# Patient Record
Sex: Male | Born: 1935 | Race: White | Hispanic: No | Smoking: Former smoker
Health system: Southern US, Community
[De-identification: ages and names within clinical notes are randomized; demographics above are authoritative.]

## PROBLEM LIST (undated history)

## (undated) DIAGNOSIS — M549 Dorsalgia, unspecified: Secondary | ICD-10-CM

## (undated) DIAGNOSIS — G2581 Restless legs syndrome: Secondary | ICD-10-CM

## (undated) DIAGNOSIS — I509 Heart failure, unspecified: Secondary | ICD-10-CM

## (undated) DIAGNOSIS — J449 Chronic obstructive pulmonary disease, unspecified: Secondary | ICD-10-CM

## (undated) DIAGNOSIS — I10 Essential (primary) hypertension: Secondary | ICD-10-CM

## (undated) DIAGNOSIS — D649 Anemia, unspecified: Secondary | ICD-10-CM

## (undated) DIAGNOSIS — I2510 Atherosclerotic heart disease of native coronary artery without angina pectoris: Secondary | ICD-10-CM

## (undated) DIAGNOSIS — D72820 Lymphocytosis (symptomatic): Secondary | ICD-10-CM

## (undated) DIAGNOSIS — D696 Thrombocytopenia, unspecified: Secondary | ICD-10-CM

## (undated) DIAGNOSIS — M1990 Unspecified osteoarthritis, unspecified site: Secondary | ICD-10-CM

## (undated) DIAGNOSIS — Z7901 Long term (current) use of anticoagulants: Secondary | ICD-10-CM

## (undated) DIAGNOSIS — G8929 Other chronic pain: Secondary | ICD-10-CM

## (undated) DIAGNOSIS — I4891 Unspecified atrial fibrillation: Secondary | ICD-10-CM

## (undated) DIAGNOSIS — T7840XA Allergy, unspecified, initial encounter: Secondary | ICD-10-CM

## (undated) DIAGNOSIS — K219 Gastro-esophageal reflux disease without esophagitis: Secondary | ICD-10-CM

## (undated) DIAGNOSIS — N529 Male erectile dysfunction, unspecified: Secondary | ICD-10-CM

## (undated) DIAGNOSIS — I441 Atrioventricular block, second degree: Secondary | ICD-10-CM

## (undated) DIAGNOSIS — E785 Hyperlipidemia, unspecified: Secondary | ICD-10-CM

## (undated) HISTORY — PX: PACEMAKER INSERTION: SHX728

## (undated) HISTORY — DX: Unspecified osteoarthritis, unspecified site: M19.90

## (undated) HISTORY — DX: Male erectile dysfunction, unspecified: N52.9

## (undated) HISTORY — DX: Heart failure, unspecified: I50.9

## (undated) HISTORY — DX: Chronic obstructive pulmonary disease, unspecified: J44.9

## (undated) HISTORY — DX: Hyperlipidemia, unspecified: E78.5

## (undated) HISTORY — DX: Lymphocytosis (symptomatic): D72.820

## (undated) HISTORY — DX: Restless legs syndrome: G25.81

## (undated) HISTORY — DX: Long term (current) use of anticoagulants: Z79.01

## (undated) HISTORY — DX: Other chronic pain: G89.29

## (undated) HISTORY — DX: Dorsalgia, unspecified: M54.9

## (undated) HISTORY — PX: CORONARY ARTERY BYPASS GRAFT: SHX141

## (undated) HISTORY — DX: Gastro-esophageal reflux disease without esophagitis: K21.9

## (undated) HISTORY — DX: Atherosclerotic heart disease of native coronary artery without angina pectoris: I25.10

## (undated) HISTORY — DX: Allergy, unspecified, initial encounter: T78.40XA

## (undated) HISTORY — DX: Essential (primary) hypertension: I10

## (undated) HISTORY — DX: Atrioventricular block, second degree: I44.1

## (undated) HISTORY — DX: Thrombocytopenia, unspecified: D69.6

## (undated) HISTORY — DX: Anemia, unspecified: D64.9

## (undated) HISTORY — PX: CATARACT EXTRACTION: SUR2

## (undated) HISTORY — DX: Unspecified atrial fibrillation: I48.91

---

## 1999-10-07 ENCOUNTER — Encounter: Admission: RE | Admit: 1999-10-07 | Discharge: 1999-10-07 | Payer: Self-pay | Admitting: Family Medicine

## 1999-10-07 ENCOUNTER — Encounter: Payer: Self-pay | Admitting: Family Medicine

## 1999-11-17 ENCOUNTER — Encounter: Payer: Self-pay | Admitting: Family Medicine

## 1999-11-17 ENCOUNTER — Encounter: Admission: RE | Admit: 1999-11-17 | Discharge: 1999-11-17 | Payer: Self-pay | Admitting: Family Medicine

## 2000-11-23 ENCOUNTER — Encounter: Admission: RE | Admit: 2000-11-23 | Discharge: 2000-11-23 | Payer: Self-pay | Admitting: Family Medicine

## 2000-11-23 ENCOUNTER — Encounter: Payer: Self-pay | Admitting: Family Medicine

## 2000-12-31 ENCOUNTER — Emergency Department (HOSPITAL_COMMUNITY): Admission: EM | Admit: 2000-12-31 | Discharge: 2001-01-01 | Payer: Self-pay | Admitting: Emergency Medicine

## 2001-12-03 ENCOUNTER — Encounter: Payer: Self-pay | Admitting: Family Medicine

## 2001-12-03 ENCOUNTER — Encounter: Admission: RE | Admit: 2001-12-03 | Discharge: 2001-12-03 | Payer: Self-pay | Admitting: Family Medicine

## 2002-10-07 ENCOUNTER — Encounter: Payer: Self-pay | Admitting: Family Medicine

## 2002-10-07 ENCOUNTER — Encounter: Admission: RE | Admit: 2002-10-07 | Discharge: 2002-10-07 | Payer: Self-pay | Admitting: Family Medicine

## 2003-10-30 ENCOUNTER — Encounter: Admission: RE | Admit: 2003-10-30 | Discharge: 2003-10-30 | Payer: Self-pay | Admitting: Family Medicine

## 2004-05-31 ENCOUNTER — Encounter: Admission: RE | Admit: 2004-05-31 | Discharge: 2004-05-31 | Payer: Self-pay | Admitting: *Deleted

## 2005-01-05 ENCOUNTER — Ambulatory Visit: Payer: Self-pay | Admitting: Family Medicine

## 2005-01-21 ENCOUNTER — Ambulatory Visit: Payer: Self-pay | Admitting: Family Medicine

## 2005-05-27 ENCOUNTER — Ambulatory Visit: Payer: Self-pay | Admitting: Family Medicine

## 2005-06-13 ENCOUNTER — Ambulatory Visit: Payer: Self-pay | Admitting: Family Medicine

## 2005-06-13 ENCOUNTER — Inpatient Hospital Stay (HOSPITAL_COMMUNITY): Admission: AD | Admit: 2005-06-13 | Discharge: 2005-06-17 | Payer: Self-pay | Admitting: Family Medicine

## 2005-06-14 ENCOUNTER — Ambulatory Visit: Payer: Self-pay | Admitting: Family Medicine

## 2005-06-14 ENCOUNTER — Ambulatory Visit: Payer: Self-pay | Admitting: Cardiology

## 2005-06-27 ENCOUNTER — Ambulatory Visit: Payer: Self-pay | Admitting: Cardiology

## 2005-09-08 ENCOUNTER — Ambulatory Visit: Payer: Self-pay | Admitting: Cardiology

## 2005-09-12 ENCOUNTER — Ambulatory Visit: Payer: Self-pay | Admitting: Cardiology

## 2005-09-28 ENCOUNTER — Ambulatory Visit: Payer: Self-pay | Admitting: Family Medicine

## 2005-11-01 ENCOUNTER — Ambulatory Visit: Payer: Self-pay | Admitting: Cardiology

## 2005-12-06 ENCOUNTER — Ambulatory Visit: Payer: Self-pay | Admitting: Cardiology

## 2006-06-07 ENCOUNTER — Ambulatory Visit: Payer: Self-pay | Admitting: Cardiology

## 2006-12-06 ENCOUNTER — Ambulatory Visit: Payer: Self-pay | Admitting: Family Medicine

## 2006-12-06 LAB — CONVERTED CEMR LAB
Alkaline Phosphatase: 85 units/L (ref 39–117)
BUN: 12 mg/dL (ref 6–23)
Basophils Absolute: 0 10*3/uL (ref 0.0–0.1)
Basophils Relative: 0.1 % (ref 0.0–1.0)
Creatinine, Ser: 1.1 mg/dL (ref 0.4–1.5)
Eosinophils Relative: 1.6 % (ref 0.0–5.0)
GFR calc Af Amer: 83 mL/min
GFR calc non Af Amer: 69 mL/min
Glucose, Bld: 103 mg/dL — ABNORMAL HIGH (ref 70–99)
LDL Cholesterol: 93 mg/dL (ref 0–99)
Neutro Abs: 2.7 10*3/uL (ref 1.4–7.7)
Platelets: 120 10*3/uL — ABNORMAL LOW (ref 150–400)
Potassium: 4.3 meq/L (ref 3.5–5.1)
RBC: 4.86 M/uL (ref 4.22–5.81)
RDW: 12.9 % (ref 11.5–14.6)
Sodium: 147 meq/L — ABNORMAL HIGH (ref 135–145)
TSH: 1.38 microintl units/mL (ref 0.35–5.50)
Total Bilirubin: 1 mg/dL (ref 0.3–1.2)
Total Protein: 7.2 g/dL (ref 6.0–8.3)
Triglycerides: 93 mg/dL (ref 0–149)

## 2006-12-07 ENCOUNTER — Ambulatory Visit: Payer: Self-pay | Admitting: Cardiology

## 2006-12-14 ENCOUNTER — Ambulatory Visit: Payer: Self-pay | Admitting: *Deleted

## 2006-12-14 ENCOUNTER — Encounter: Admission: RE | Admit: 2006-12-14 | Discharge: 2006-12-14 | Payer: Self-pay | Admitting: Family Medicine

## 2007-01-09 ENCOUNTER — Ambulatory Visit: Payer: Self-pay | Admitting: Family Medicine

## 2007-05-10 ENCOUNTER — Telehealth: Payer: Self-pay | Admitting: Family Medicine

## 2007-06-07 ENCOUNTER — Ambulatory Visit: Payer: Self-pay | Admitting: Cardiology

## 2007-06-28 ENCOUNTER — Ambulatory Visit: Payer: Self-pay | Admitting: *Deleted

## 2007-08-08 ENCOUNTER — Telehealth: Payer: Self-pay

## 2007-08-15 ENCOUNTER — Ambulatory Visit: Payer: Self-pay | Admitting: Family Medicine

## 2007-08-15 DIAGNOSIS — J449 Chronic obstructive pulmonary disease, unspecified: Secondary | ICD-10-CM

## 2007-08-15 DIAGNOSIS — R350 Frequency of micturition: Secondary | ICD-10-CM

## 2007-08-15 DIAGNOSIS — N400 Enlarged prostate without lower urinary tract symptoms: Secondary | ICD-10-CM | POA: Insufficient documentation

## 2007-08-15 DIAGNOSIS — K219 Gastro-esophageal reflux disease without esophagitis: Secondary | ICD-10-CM

## 2007-08-15 DIAGNOSIS — J4489 Other specified chronic obstructive pulmonary disease: Secondary | ICD-10-CM | POA: Insufficient documentation

## 2007-08-15 DIAGNOSIS — E785 Hyperlipidemia, unspecified: Secondary | ICD-10-CM

## 2007-08-15 DIAGNOSIS — I25810 Atherosclerosis of coronary artery bypass graft(s) without angina pectoris: Secondary | ICD-10-CM

## 2007-08-15 LAB — CONVERTED CEMR LAB: Nitrite: NEGATIVE

## 2007-08-16 ENCOUNTER — Telehealth: Payer: Self-pay | Admitting: Family Medicine

## 2007-09-10 ENCOUNTER — Telehealth: Payer: Self-pay | Admitting: Family Medicine

## 2007-12-06 ENCOUNTER — Ambulatory Visit: Payer: Self-pay | Admitting: *Deleted

## 2007-12-11 ENCOUNTER — Telehealth: Payer: Self-pay | Admitting: Family Medicine

## 2007-12-25 ENCOUNTER — Telehealth: Payer: Self-pay | Admitting: Family Medicine

## 2007-12-27 ENCOUNTER — Ambulatory Visit: Payer: Self-pay | Admitting: Family Medicine

## 2007-12-27 DIAGNOSIS — G4700 Insomnia, unspecified: Secondary | ICD-10-CM | POA: Insufficient documentation

## 2007-12-27 DIAGNOSIS — G2581 Restless legs syndrome: Secondary | ICD-10-CM | POA: Insufficient documentation

## 2008-01-02 ENCOUNTER — Ambulatory Visit: Payer: Self-pay | Admitting: Cardiology

## 2008-01-02 ENCOUNTER — Telehealth: Payer: Self-pay | Admitting: Family Medicine

## 2008-01-08 ENCOUNTER — Telehealth: Payer: Self-pay | Admitting: Family Medicine

## 2008-01-24 ENCOUNTER — Telehealth: Payer: Self-pay | Admitting: Family Medicine

## 2008-02-04 ENCOUNTER — Telehealth: Payer: Self-pay | Admitting: Family Medicine

## 2008-04-07 ENCOUNTER — Telehealth: Payer: Self-pay | Admitting: Family Medicine

## 2008-06-05 ENCOUNTER — Ambulatory Visit: Payer: Self-pay | Admitting: *Deleted

## 2008-06-11 ENCOUNTER — Ambulatory Visit: Payer: Self-pay | Admitting: Family Medicine

## 2008-06-26 ENCOUNTER — Telehealth: Payer: Self-pay | Admitting: Family Medicine

## 2008-07-24 ENCOUNTER — Ambulatory Visit: Payer: Self-pay | Admitting: Cardiology

## 2008-08-14 ENCOUNTER — Telehealth: Payer: Self-pay | Admitting: Family Medicine

## 2008-08-19 ENCOUNTER — Telehealth: Payer: Self-pay | Admitting: Family Medicine

## 2008-08-20 ENCOUNTER — Telehealth: Payer: Self-pay | Admitting: Family Medicine

## 2008-09-01 ENCOUNTER — Telehealth: Payer: Self-pay | Admitting: Family Medicine

## 2008-09-02 ENCOUNTER — Ambulatory Visit: Payer: Self-pay | Admitting: Family Medicine

## 2008-09-02 DIAGNOSIS — F528 Other sexual dysfunction not due to a substance or known physiological condition: Secondary | ICD-10-CM | POA: Insufficient documentation

## 2008-09-02 DIAGNOSIS — M1610 Unilateral primary osteoarthritis, unspecified hip: Secondary | ICD-10-CM | POA: Insufficient documentation

## 2008-10-01 ENCOUNTER — Ambulatory Visit: Payer: Self-pay | Admitting: Family Medicine

## 2008-10-02 ENCOUNTER — Ambulatory Visit: Payer: Self-pay | Admitting: Internal Medicine

## 2008-10-02 LAB — CONVERTED CEMR LAB
AST: 21 units/L (ref 0–37)
Albumin: 3.5 g/dL (ref 3.5–5.2)
Basophils Absolute: 0 10*3/uL (ref 0.0–0.1)
Basophils Relative: 0.2 % (ref 0.0–3.0)
CO2: 31 meq/L (ref 19–32)
Calcium: 9.6 mg/dL (ref 8.4–10.5)
Chloride: 107 meq/L (ref 96–112)
Eosinophils Absolute: 0.1 10*3/uL (ref 0.0–0.7)
Glucose, Bld: 109 mg/dL — ABNORMAL HIGH (ref 70–99)
Monocytes Absolute: 0.5 10*3/uL (ref 0.1–1.0)
Monocytes Relative: 6 % (ref 3.0–12.0)
Neutro Abs: 4.7 10*3/uL (ref 1.4–7.7)
Neutrophils Relative %: 52 % (ref 43.0–77.0)
RBC: 4.44 M/uL (ref 4.22–5.81)
RDW: 12.6 % (ref 11.5–14.6)
Sodium: 144 meq/L (ref 135–145)
WBC: 8.9 10*3/uL (ref 4.5–10.5)

## 2008-10-03 ENCOUNTER — Telehealth: Payer: Self-pay | Admitting: Family Medicine

## 2008-11-05 ENCOUNTER — Ambulatory Visit: Payer: Self-pay | Admitting: Family Medicine

## 2008-11-05 DIAGNOSIS — E559 Vitamin D deficiency, unspecified: Secondary | ICD-10-CM | POA: Insufficient documentation

## 2008-11-05 DIAGNOSIS — T50995A Adverse effect of other drugs, medicaments and biological substances, initial encounter: Secondary | ICD-10-CM

## 2008-11-05 DIAGNOSIS — E039 Hypothyroidism, unspecified: Secondary | ICD-10-CM | POA: Insufficient documentation

## 2008-11-05 LAB — CONVERTED CEMR LAB
Glucose, Urine, Semiquant: NEGATIVE
Urobilinogen, UA: 0.2
pH: 6.5

## 2008-11-10 LAB — CONVERTED CEMR LAB
ALT: 15 units/L (ref 0–53)
Albumin: 3.7 g/dL (ref 3.5–5.2)
BUN: 14 mg/dL (ref 6–23)
CO2: 33 meq/L — ABNORMAL HIGH (ref 19–32)
Calcium: 9.4 mg/dL (ref 8.4–10.5)
Cholesterol: 139 mg/dL (ref 0–200)
Creatinine, Ser: 1 mg/dL (ref 0.4–1.5)
Eosinophils Absolute: 0.1 10*3/uL (ref 0.0–0.7)
HCT: 41.6 % (ref 39.0–52.0)
Hemoglobin: 14 g/dL (ref 13.0–17.0)
LDL Cholesterol: 81 mg/dL (ref 0–99)
MCHC: 33.7 g/dL (ref 30.0–36.0)
MCV: 94.8 fL (ref 78.0–100.0)
Neutro Abs: 3.3 10*3/uL (ref 1.4–7.7)
PSA: 1.49 ng/mL (ref 0.10–4.00)
Potassium: 4.5 meq/L (ref 3.5–5.1)
RBC: 4.39 M/uL (ref 4.22–5.81)
RDW: 12.9 % (ref 11.5–14.6)
TSH: 1.33 microintl units/mL (ref 0.35–5.50)
Total Bilirubin: 0.9 mg/dL (ref 0.3–1.2)
Total Protein: 6.2 g/dL (ref 6.0–8.3)
Triglycerides: 60 mg/dL (ref 0–149)
VLDL: 12 mg/dL (ref 0–40)
WBC: 8.2 10*3/uL (ref 4.5–10.5)

## 2008-11-13 ENCOUNTER — Ambulatory Visit: Payer: Self-pay | Admitting: Family Medicine

## 2008-11-17 ENCOUNTER — Ambulatory Visit: Payer: Self-pay | Admitting: Family Medicine

## 2008-11-17 LAB — CONVERTED CEMR LAB
Lymphocytes Relative: 53 % — ABNORMAL HIGH (ref 12.0–46.0)
MCV: 93.6 fL (ref 78.0–100.0)
Neutrophils Relative %: 38.6 % — ABNORMAL LOW (ref 43.0–77.0)
OCCULT 1: NEGATIVE
OCCULT 2: NEGATIVE
OCCULT 3: NEGATIVE
RBC: 4.16 M/uL — ABNORMAL LOW (ref 4.22–5.81)
RDW: 13.1 % (ref 11.5–14.6)
WBC: 6.7 10*3/uL (ref 4.5–10.5)

## 2008-11-26 ENCOUNTER — Encounter: Payer: Self-pay | Admitting: Family Medicine

## 2008-12-03 ENCOUNTER — Ambulatory Visit: Payer: Self-pay | Admitting: *Deleted

## 2008-12-31 ENCOUNTER — Ambulatory Visit: Payer: Self-pay | Admitting: Family Medicine

## 2008-12-31 DIAGNOSIS — R609 Edema, unspecified: Secondary | ICD-10-CM | POA: Insufficient documentation

## 2008-12-31 DIAGNOSIS — M549 Dorsalgia, unspecified: Secondary | ICD-10-CM | POA: Insufficient documentation

## 2008-12-31 DIAGNOSIS — I10 Essential (primary) hypertension: Secondary | ICD-10-CM

## 2009-01-07 ENCOUNTER — Telehealth: Payer: Self-pay | Admitting: Family Medicine

## 2009-01-09 ENCOUNTER — Ambulatory Visit: Payer: Self-pay | Admitting: Cardiology

## 2009-01-09 ENCOUNTER — Inpatient Hospital Stay (HOSPITAL_COMMUNITY): Admission: EM | Admit: 2009-01-09 | Discharge: 2009-01-10 | Payer: Self-pay | Admitting: Emergency Medicine

## 2009-01-10 ENCOUNTER — Encounter (INDEPENDENT_AMBULATORY_CARE_PROVIDER_SITE_OTHER): Payer: Self-pay | Admitting: Ophthalmology

## 2009-01-14 ENCOUNTER — Telehealth: Payer: Self-pay | Admitting: Cardiology

## 2009-01-15 ENCOUNTER — Ambulatory Visit: Payer: Self-pay | Admitting: Cardiology

## 2009-01-15 ENCOUNTER — Encounter: Payer: Self-pay | Admitting: Cardiology

## 2009-01-15 ENCOUNTER — Ambulatory Visit: Payer: Self-pay

## 2009-01-16 DIAGNOSIS — I2510 Atherosclerotic heart disease of native coronary artery without angina pectoris: Secondary | ICD-10-CM

## 2009-01-19 ENCOUNTER — Ambulatory Visit: Payer: Self-pay | Admitting: Cardiology

## 2009-01-21 ENCOUNTER — Ambulatory Visit: Payer: Self-pay | Admitting: Internal Medicine

## 2009-01-21 LAB — CONVERTED CEMR LAB
BUN: 18 mg/dL (ref 6–23)
Calcium: 9.4 mg/dL (ref 8.4–10.5)
Chloride: 106 meq/L (ref 96–112)
GFR calc non Af Amer: 61.71 mL/min (ref >60)
Sodium: 142 meq/L (ref 135–145)

## 2009-01-23 ENCOUNTER — Telehealth: Payer: Self-pay | Admitting: Internal Medicine

## 2009-02-03 ENCOUNTER — Encounter: Payer: Self-pay | Admitting: Internal Medicine

## 2009-02-06 LAB — CONVERTED CEMR LAB
CO2: 32 meq/L (ref 19–32)
Calcium: 9.9 mg/dL (ref 8.4–10.5)
GFR calc non Af Amer: 56.27 mL/min (ref >60)
Glucose, Bld: 175 mg/dL — ABNORMAL HIGH (ref 70–99)
Potassium: 4.7 meq/L (ref 3.5–5.1)
Sodium: 140 meq/L (ref 135–145)

## 2009-02-09 ENCOUNTER — Telehealth: Payer: Self-pay | Admitting: Internal Medicine

## 2009-02-23 ENCOUNTER — Ambulatory Visit: Payer: Self-pay | Admitting: Internal Medicine

## 2009-02-23 LAB — CONVERTED CEMR LAB
BUN: 15 mg/dL (ref 6–23)
Basophils Relative: 0 % (ref 0.0–3.0)
Creatinine, Ser: 1 mg/dL (ref 0.4–1.5)
Eosinophils Relative: 1.8 % (ref 0.0–5.0)
GFR calc non Af Amer: 76.15 mL/min (ref >60)
MCHC: 34.1 g/dL (ref 30.0–36.0)
Monocytes Relative: 5.1 % (ref 3.0–12.0)
Platelets: 80 10*3/uL — ABNORMAL LOW (ref 150.0–400.0)
Potassium: 4.2 meq/L (ref 3.5–5.1)
RBC: 4.03 M/uL — ABNORMAL LOW (ref 4.22–5.81)
RDW: 12.7 % (ref 11.5–14.6)

## 2009-02-25 ENCOUNTER — Telehealth: Payer: Self-pay | Admitting: Internal Medicine

## 2009-03-02 ENCOUNTER — Inpatient Hospital Stay (HOSPITAL_COMMUNITY): Admission: RE | Admit: 2009-03-02 | Discharge: 2009-03-03 | Payer: Self-pay | Admitting: Internal Medicine

## 2009-03-02 ENCOUNTER — Ambulatory Visit: Payer: Self-pay | Admitting: Internal Medicine

## 2009-03-03 ENCOUNTER — Encounter: Payer: Self-pay | Admitting: Internal Medicine

## 2009-03-06 ENCOUNTER — Ambulatory Visit: Payer: Self-pay | Admitting: Cardiology

## 2009-03-06 ENCOUNTER — Encounter (INDEPENDENT_AMBULATORY_CARE_PROVIDER_SITE_OTHER): Payer: Self-pay | Admitting: Cardiology

## 2009-03-06 LAB — CONVERTED CEMR LAB: Prothrombin Time: 14.2 s

## 2009-03-07 ENCOUNTER — Telehealth: Payer: Self-pay | Admitting: Nurse Practitioner

## 2009-03-12 ENCOUNTER — Ambulatory Visit: Payer: Self-pay | Admitting: Internal Medicine

## 2009-03-12 LAB — CONVERTED CEMR LAB
POC INR: 1.6
Prothrombin Time: 15.8 s

## 2009-03-18 ENCOUNTER — Ambulatory Visit: Payer: Self-pay | Admitting: Internal Medicine

## 2009-03-18 ENCOUNTER — Encounter: Payer: Self-pay | Admitting: Internal Medicine

## 2009-03-18 ENCOUNTER — Ambulatory Visit: Payer: Self-pay

## 2009-03-18 LAB — CONVERTED CEMR LAB: POC INR: 2.7

## 2009-03-20 ENCOUNTER — Telehealth: Payer: Self-pay | Admitting: Family Medicine

## 2009-03-30 ENCOUNTER — Telehealth: Payer: Self-pay | Admitting: Family Medicine

## 2009-04-01 ENCOUNTER — Ambulatory Visit: Payer: Self-pay | Admitting: Cardiology

## 2009-04-01 LAB — CONVERTED CEMR LAB: POC INR: 1.7

## 2009-04-15 ENCOUNTER — Ambulatory Visit: Payer: Self-pay | Admitting: Internal Medicine

## 2009-04-15 LAB — CONVERTED CEMR LAB: POC INR: 1.6

## 2009-04-29 ENCOUNTER — Ambulatory Visit: Payer: Self-pay | Admitting: Internal Medicine

## 2009-04-29 LAB — CONVERTED CEMR LAB: POC INR: 2.2

## 2009-05-07 ENCOUNTER — Ambulatory Visit: Payer: Self-pay | Admitting: Family Medicine

## 2009-05-08 ENCOUNTER — Encounter (INDEPENDENT_AMBULATORY_CARE_PROVIDER_SITE_OTHER): Payer: Self-pay | Admitting: *Deleted

## 2009-05-20 ENCOUNTER — Ambulatory Visit: Payer: Self-pay | Admitting: Internal Medicine

## 2009-05-20 LAB — CONVERTED CEMR LAB: POC INR: 2.1

## 2009-05-21 ENCOUNTER — Telehealth: Payer: Self-pay | Admitting: Family Medicine

## 2009-05-27 ENCOUNTER — Encounter (INDEPENDENT_AMBULATORY_CARE_PROVIDER_SITE_OTHER): Payer: Self-pay | Admitting: *Deleted

## 2009-06-12 ENCOUNTER — Ambulatory Visit: Payer: Self-pay | Admitting: Vascular Surgery

## 2009-06-16 ENCOUNTER — Encounter: Payer: Self-pay | Admitting: Internal Medicine

## 2009-06-17 ENCOUNTER — Ambulatory Visit: Payer: Self-pay | Admitting: Internal Medicine

## 2009-06-17 DIAGNOSIS — I4891 Unspecified atrial fibrillation: Secondary | ICD-10-CM

## 2009-06-17 DIAGNOSIS — I441 Atrioventricular block, second degree: Secondary | ICD-10-CM | POA: Insufficient documentation

## 2009-06-17 LAB — CONVERTED CEMR LAB: POC INR: 2

## 2009-06-22 ENCOUNTER — Ambulatory Visit: Payer: Self-pay | Admitting: Family Medicine

## 2009-06-23 LAB — CONVERTED CEMR LAB
BUN: 14 mg/dL (ref 6–23)
CO2: 31 meq/L (ref 19–32)
Calcium: 9.1 mg/dL (ref 8.4–10.5)
Chloride: 102 meq/L (ref 96–112)
GFR calc non Af Amer: 68.16 mL/min (ref >60)
Potassium: 4.5 meq/L (ref 3.5–5.1)
Sodium: 140 meq/L (ref 135–145)

## 2009-06-25 ENCOUNTER — Ambulatory Visit: Payer: Self-pay | Admitting: Cardiovascular Disease

## 2009-06-25 ENCOUNTER — Telehealth: Payer: Self-pay | Admitting: Family Medicine

## 2009-06-30 ENCOUNTER — Ambulatory Visit: Payer: Self-pay | Admitting: Family Medicine

## 2009-06-30 ENCOUNTER — Telehealth: Payer: Self-pay | Admitting: Internal Medicine

## 2009-06-30 DIAGNOSIS — K8020 Calculus of gallbladder without cholecystitis without obstruction: Secondary | ICD-10-CM | POA: Insufficient documentation

## 2009-07-03 ENCOUNTER — Encounter: Admission: RE | Admit: 2009-07-03 | Discharge: 2009-07-03 | Payer: Self-pay | Admitting: Family Medicine

## 2009-07-03 ENCOUNTER — Ambulatory Visit: Payer: Self-pay | Admitting: Internal Medicine

## 2009-07-03 LAB — CONVERTED CEMR LAB: POC INR: 1.9

## 2009-07-08 ENCOUNTER — Ambulatory Visit: Payer: Self-pay | Admitting: Family Medicine

## 2009-07-08 DIAGNOSIS — K824 Cholesterolosis of gallbladder: Secondary | ICD-10-CM

## 2009-07-15 ENCOUNTER — Ambulatory Visit: Payer: Self-pay | Admitting: Internal Medicine

## 2009-07-20 ENCOUNTER — Ambulatory Visit: Payer: Self-pay | Admitting: Internal Medicine

## 2009-07-28 ENCOUNTER — Encounter: Payer: Self-pay | Admitting: Family Medicine

## 2009-08-10 ENCOUNTER — Ambulatory Visit: Payer: Self-pay | Admitting: Internal Medicine

## 2009-08-12 ENCOUNTER — Ambulatory Visit: Payer: Self-pay | Admitting: Cardiology

## 2009-09-02 ENCOUNTER — Telehealth: Payer: Self-pay | Admitting: Internal Medicine

## 2009-09-02 ENCOUNTER — Telehealth: Payer: Self-pay | Admitting: Family Medicine

## 2009-09-09 ENCOUNTER — Ambulatory Visit: Payer: Self-pay | Admitting: Cardiovascular Disease

## 2009-10-06 ENCOUNTER — Telehealth: Payer: Self-pay | Admitting: Family Medicine

## 2009-10-07 ENCOUNTER — Ambulatory Visit: Payer: Self-pay | Admitting: Internal Medicine

## 2009-10-07 LAB — CONVERTED CEMR LAB: POC INR: 3.7

## 2009-10-21 ENCOUNTER — Ambulatory Visit: Payer: Self-pay | Admitting: Internal Medicine

## 2009-10-21 LAB — CONVERTED CEMR LAB: POC INR: 2.3

## 2009-10-22 ENCOUNTER — Ambulatory Visit: Payer: Self-pay | Admitting: Family Medicine

## 2009-10-22 ENCOUNTER — Telehealth: Payer: Self-pay | Admitting: Family Medicine

## 2009-10-22 DIAGNOSIS — M940 Chondrocostal junction syndrome [Tietze]: Secondary | ICD-10-CM | POA: Insufficient documentation

## 2009-11-18 ENCOUNTER — Ambulatory Visit: Payer: Self-pay | Admitting: Internal Medicine

## 2009-11-25 ENCOUNTER — Ambulatory Visit: Payer: Self-pay | Admitting: Family Medicine

## 2009-11-25 LAB — CONVERTED CEMR LAB
Glucose, Urine, Semiquant: NEGATIVE
Ketones, urine, test strip: NEGATIVE
pH: 5

## 2009-12-01 LAB — CONVERTED CEMR LAB
ALT: 18 units/L (ref 0–53)
Albumin: 3.7 g/dL (ref 3.5–5.2)
Alkaline Phosphatase: 57 units/L (ref 39–117)
Basophils Absolute: 0 10*3/uL (ref 0.0–0.1)
Bilirubin, Direct: 0.1 mg/dL (ref 0.0–0.3)
Calcium: 9.2 mg/dL (ref 8.4–10.5)
Chloride: 105 meq/L (ref 96–112)
Eosinophils Absolute: 0.1 10*3/uL (ref 0.0–0.7)
GFR calc non Af Amer: 56.15 mL/min (ref >60)
Glucose, Bld: 97 mg/dL (ref 70–99)
HDL: 54.8 mg/dL (ref >39.00)
LDL Cholesterol: 55 mg/dL (ref 0–99)
Lymphs Abs: 5 10*3/uL — ABNORMAL HIGH (ref 0.7–4.0)
MCV: 94.9 fL (ref 78.0–100.0)
Monocytes Absolute: 0.5 10*3/uL (ref 0.1–1.0)
Neutrophils Relative %: 31.8 % — ABNORMAL LOW (ref 43.0–77.0)
PSA: 3.15 ng/mL (ref 0.10–4.00)
Potassium: 4.3 meq/L (ref 3.5–5.1)
RBC: 4.44 M/uL (ref 4.22–5.81)
Total Bilirubin: 0.5 mg/dL (ref 0.3–1.2)
Total CHOL/HDL Ratio: 2
Total Protein: 6.2 g/dL (ref 6.0–8.3)
Triglycerides: 48 mg/dL (ref 0.0–149.0)
Vit D, 25-Hydroxy: 43 ng/mL (ref 30–89)

## 2009-12-02 ENCOUNTER — Ambulatory Visit: Payer: Self-pay | Admitting: Family Medicine

## 2009-12-02 ENCOUNTER — Ambulatory Visit: Payer: Self-pay | Admitting: Cardiovascular Disease

## 2009-12-04 ENCOUNTER — Encounter: Payer: Self-pay | Admitting: Family Medicine

## 2009-12-04 ENCOUNTER — Ambulatory Visit: Payer: Self-pay | Admitting: Vascular Surgery

## 2009-12-08 LAB — CONVERTED CEMR LAB
Basophils Absolute: 0 10*3/uL (ref 0.0–0.1)
Eosinophils Relative: 1.3 % (ref 0.0–5.0)
Lymphocytes Relative: 51.7 % — ABNORMAL HIGH (ref 12.0–46.0)
Monocytes Relative: 6.1 % (ref 3.0–12.0)
Neutrophils Relative %: 40.9 % — ABNORMAL LOW (ref 43.0–77.0)
Platelets: 91 10*3/uL — ABNORMAL LOW (ref 150.0–400.0)
RDW: 14.2 % (ref 11.5–14.6)
WBC: 7.5 10*3/uL (ref 4.5–10.5)

## 2009-12-10 ENCOUNTER — Ambulatory Visit: Payer: Self-pay | Admitting: Family Medicine

## 2009-12-18 LAB — CONVERTED CEMR LAB
OCCULT 1: NEGATIVE
OCCULT 2: NEGATIVE
OCCULT 3: NEGATIVE

## 2009-12-22 ENCOUNTER — Encounter: Payer: Self-pay | Admitting: Family Medicine

## 2009-12-23 ENCOUNTER — Ambulatory Visit: Payer: Self-pay | Admitting: Cardiology

## 2009-12-23 LAB — CONVERTED CEMR LAB: POC INR: 2.3

## 2010-01-20 ENCOUNTER — Ambulatory Visit: Payer: Self-pay | Admitting: Cardiology

## 2010-01-28 ENCOUNTER — Ambulatory Visit: Payer: Self-pay | Admitting: Cardiology

## 2010-01-28 DIAGNOSIS — I714 Abdominal aortic aneurysm, without rupture: Secondary | ICD-10-CM

## 2010-02-17 ENCOUNTER — Ambulatory Visit: Payer: Self-pay | Admitting: Internal Medicine

## 2010-02-24 ENCOUNTER — Telehealth: Payer: Self-pay | Admitting: Cardiology

## 2010-02-24 ENCOUNTER — Telehealth: Payer: Self-pay | Admitting: Family Medicine

## 2010-03-10 ENCOUNTER — Telehealth: Payer: Self-pay | Admitting: Family Medicine

## 2010-03-11 ENCOUNTER — Ambulatory Visit: Payer: Self-pay | Admitting: Family Medicine

## 2010-03-11 ENCOUNTER — Telehealth: Payer: Self-pay | Admitting: Family Medicine

## 2010-03-12 ENCOUNTER — Encounter: Payer: Self-pay | Admitting: Family Medicine

## 2010-03-17 ENCOUNTER — Ambulatory Visit: Payer: Self-pay | Admitting: Internal Medicine

## 2010-03-19 ENCOUNTER — Telehealth: Payer: Self-pay | Admitting: Family Medicine

## 2010-03-22 LAB — CONVERTED CEMR LAB
Basophils Absolute: 0 10*3/uL (ref 0.0–0.1)
Basophils Relative: 0 % (ref 0–1)
Eosinophils Absolute: 0.1 10*3/uL (ref 0.0–0.7)
MCHC: 32.8 g/dL (ref 30.0–36.0)
MCV: 94.2 fL (ref 78.0–100.0)
Monocytes Relative: 5 % (ref 3–12)
Neutrophils Relative %: 32 % — ABNORMAL LOW (ref 43–77)
Platelets: 95 10*3/uL — ABNORMAL LOW (ref 150–400)
RBC: 4.69 M/uL (ref 4.22–5.81)
RDW: 13.3 % (ref 11.5–15.5)

## 2010-03-25 ENCOUNTER — Encounter: Payer: Self-pay | Admitting: Family Medicine

## 2010-03-29 ENCOUNTER — Telehealth: Payer: Self-pay | Admitting: Family Medicine

## 2010-04-02 ENCOUNTER — Telehealth: Payer: Self-pay | Admitting: Family Medicine

## 2010-04-14 ENCOUNTER — Ambulatory Visit: Payer: Self-pay | Admitting: Cardiology

## 2010-04-27 ENCOUNTER — Telehealth: Payer: Self-pay | Admitting: Family Medicine

## 2010-04-29 ENCOUNTER — Telehealth: Payer: Self-pay | Admitting: Family Medicine

## 2010-05-12 ENCOUNTER — Ambulatory Visit: Payer: Self-pay | Admitting: Cardiovascular Disease

## 2010-05-14 ENCOUNTER — Telehealth: Payer: Self-pay | Admitting: Family Medicine

## 2010-05-14 ENCOUNTER — Encounter: Payer: Self-pay | Admitting: Family Medicine

## 2010-05-25 ENCOUNTER — Emergency Department: Payer: Self-pay | Admitting: Emergency Medicine

## 2010-05-26 ENCOUNTER — Telehealth: Payer: Self-pay | Admitting: Family Medicine

## 2010-05-26 ENCOUNTER — Encounter: Payer: Self-pay | Admitting: Family Medicine

## 2010-06-01 ENCOUNTER — Ambulatory Visit: Payer: Self-pay | Admitting: Family Medicine

## 2010-06-01 DIAGNOSIS — K5900 Constipation, unspecified: Secondary | ICD-10-CM | POA: Insufficient documentation

## 2010-06-01 DIAGNOSIS — S43109A Unspecified dislocation of unspecified acromioclavicular joint, initial encounter: Secondary | ICD-10-CM | POA: Insufficient documentation

## 2010-06-08 ENCOUNTER — Ambulatory Visit: Payer: Self-pay | Admitting: Cardiology

## 2010-06-08 LAB — CONVERTED CEMR LAB: POC INR: 1.7

## 2010-06-11 ENCOUNTER — Ambulatory Visit: Payer: Self-pay | Admitting: Internal Medicine

## 2010-06-22 ENCOUNTER — Ambulatory Visit: Payer: Self-pay | Admitting: Cardiology

## 2010-07-14 ENCOUNTER — Ambulatory Visit: Payer: Self-pay | Admitting: Family Medicine

## 2010-07-20 ENCOUNTER — Ambulatory Visit: Payer: Self-pay | Admitting: Cardiovascular Disease

## 2010-08-06 ENCOUNTER — Telehealth: Payer: Self-pay | Admitting: Family Medicine

## 2010-08-10 ENCOUNTER — Telehealth: Payer: Self-pay | Admitting: Family Medicine

## 2010-08-12 ENCOUNTER — Encounter: Payer: Self-pay | Admitting: Internal Medicine

## 2010-08-12 ENCOUNTER — Ambulatory Visit: Payer: Self-pay

## 2010-08-12 ENCOUNTER — Ambulatory Visit (HOSPITAL_COMMUNITY)
Admission: RE | Admit: 2010-08-12 | Discharge: 2010-08-12 | Payer: Self-pay | Source: Home / Self Care | Admitting: Internal Medicine

## 2010-08-13 ENCOUNTER — Telehealth: Payer: Self-pay | Admitting: Family Medicine

## 2010-08-17 ENCOUNTER — Ambulatory Visit: Payer: Self-pay | Admitting: Internal Medicine

## 2010-08-17 LAB — CONVERTED CEMR LAB: POC INR: 1.8

## 2010-08-24 ENCOUNTER — Ambulatory Visit: Payer: Self-pay | Admitting: Vascular Surgery

## 2010-08-24 ENCOUNTER — Ambulatory Visit: Payer: Self-pay | Admitting: Internal Medicine

## 2010-08-26 ENCOUNTER — Encounter: Payer: Self-pay | Admitting: Cardiology

## 2010-08-26 ENCOUNTER — Ambulatory Visit: Payer: Self-pay | Admitting: Cardiology

## 2010-09-07 ENCOUNTER — Ambulatory Visit
Admission: RE | Admit: 2010-09-07 | Discharge: 2010-09-07 | Payer: Self-pay | Source: Home / Self Care | Attending: Family Medicine | Admitting: Family Medicine

## 2010-09-09 ENCOUNTER — Encounter: Payer: Self-pay | Admitting: Internal Medicine

## 2010-09-09 ENCOUNTER — Ambulatory Visit: Admission: RE | Admit: 2010-09-09 | Discharge: 2010-09-09 | Payer: Self-pay | Source: Home / Self Care

## 2010-09-09 ENCOUNTER — Ambulatory Visit
Admission: RE | Admit: 2010-09-09 | Discharge: 2010-09-09 | Payer: Self-pay | Source: Home / Self Care | Attending: Internal Medicine | Admitting: Internal Medicine

## 2010-09-20 ENCOUNTER — Encounter (INDEPENDENT_AMBULATORY_CARE_PROVIDER_SITE_OTHER): Payer: Self-pay | Admitting: *Deleted

## 2010-09-23 ENCOUNTER — Ambulatory Visit: Admission: RE | Admit: 2010-09-23 | Discharge: 2010-09-23 | Payer: Self-pay | Source: Home / Self Care

## 2010-09-23 LAB — CONVERTED CEMR LAB: POC INR: 2.2
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
## 2010-10-05 NOTE — Progress Notes (Signed)
Summary: rx simvastatin to [facility]   Phone Note From Pharmacy   Caller: [facility]  [address] # [id number]* Summary of Call: [facility] requesting 1 month supply simvastatin 10 mg  while wiating on his mail order meds  Initial call taken by: Pura Spice, RN,  March 11, 2010 2:02 PM  Follow-up for Phone Call        ok per dr Scotty Court  Follow-up by: Pura Spice, RN,  March 11, 2010 2:02 PM    Prescriptions: SIMVASTATIN 10 MG TABS (SIMVASTATIN) Take one tablet by mouth daily at bedtime  #30 x 1   Entered by:   Pura Spice, RN   Authorized by:   Judithann Sheen MD   Signed by:   Pura Spice, RN on 03/11/2010   Method used:   Electronically to        [facility]. # [id number]* (retail)       [address]       [place], [place]  [address]       Ph: [phone] or [phone]       Fax: [phone]   RxID:   [id number]

## 2010-10-05 NOTE — Consult Note (Signed)
Summary: [facility]  [facility]   Imported By: Maryln Gottron 09/09/2009 13:31:03  _____________________________________________________________________  External Attachment:    Type:   Image     Comment:   External Document
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
## 2010-10-05 NOTE — Assessment & Plan Note (Signed)
Summary: [name] [name] fasting/cjr   Vital Signs:  Patient profile:   77 year old male Height:      69 inches Weight:      168 pounds O2 Sat:      94 % Temp:     97.2 degrees F Pulse rate:   78 / minute BP sitting:   110 / 64  (left arm)  Vitals Entered By: Pura Spice, RN (November 25, 2009 10:38 AM) CC: refill meds fasting for labs discuss problems  Is Patient Diabetic? No   History of Present Illness: this 77 year old white male is in to discuss his multiple medical problems and refill necessary medications He has been doing relatively well his blood pressure to control her at he is under the care of Dr. Riley Kill and has had a pacemaker which has controlled his cardiac rate,no further problem was atrial failed Complains of right hip pain not relieved with IV Profen nor Tylenol. Continues to complain of rectal dysfunction and needs his Viagra refill. He relates he is on Coumadin therapy and has been doing her well as far as control. Is not curative and better controlled since he has been on Flomax. He continues to use his Ventolin for his COPD and wheezing however continues to smoke. GERD has been controlled Needs to continue treatment for restless leg syndrome as well as insomnia complains of nasal congestion and drainage with facial pressure  Allergies: 1)  ! Pcn 2)  Amoxicillin (Amoxicillin)  Past History:  Past Medical History: Last updated: 06/17/2009 CAD (ICD-414.00) SYNCOPE (ICD-780.2) HYPERTENSION, BENIGN (ICD-401.1) BACK PAIN, CHRONIC (ICD-724.5) VITAMIN D DEFICIENCY (ICD-268.9) SPECIAL SCREENING MALIGNANT NEOPLASM OF PROSTATE (ICD-V76.44) HYPOTHYROIDISM (ICD-244.9) ANEMIA (ICD-285.9) ERECTILE DYSFUNCTION (ICD-302.72) ARTHRITIS, HIP (ICD-716.95) RESTLESS LEG SYNDROME (ICD-333.94) CORONARY ATHEROSCLEROSIS, ARTERY BYPASS GRAFT (ICD-414.04) BENIGN PROSTATIC HYPERTROPHY, HX OF (ICD-V13.8) GERD (ICD-530.81) FREQUENCY, URINARY (ICD-788.41) COPD  (ICD-496) HYPERLIPIDEMIA (ICD-272.4) CAD, S/P CABG x2 and last cath, October 2006 with PCI to SVG with       non-flow drug-eluting platform, continue patency of LIMA to LAD,       slightly hazy/irregular disease involving circumflex artery, which       was not involved in bypass, minimal decreased LV function.  Stable AAA.  History of 77-pack-year smoking, history quitting in November 2009.  Paroxysmal atrial fibrillation Mobtitz II AV block  Past Surgical History: Last updated: 01/21/2009 s/p CABG  Cataract extraction  Social History: Last updated: 01/16/2009  The patient lives in [place] with his girlfriend.   He is retired, although very active around the house.  He has a 77-pack-   year smoking history, quitting in November 2009.  He has a history of   alcohol abuse and continues to drink very moderately, no illicit drug   use.  No herbal medications.  He has a regular diet.  No regular   exercise, but very active.   Risk Factors: Smoking Status: quit (07/20/2009) Packs/Day: 1 (08/15/2007)  Past History:  Care Management: Cardiology:  Dr Riley Kill  Review of Systems  The patient denies anorexia, fever, weight loss, weight gain, vision loss, decreased hearing, hoarseness, chest pain, syncope, dyspnea on exertion, peripheral edema, prolonged cough, headaches, hemoptysis, abdominal pain, melena, hematochezia, severe indigestion/heartburn, hematuria, incontinence, genital sores, muscle weakness, suspicious skin lesions, transient blindness, difficulty walking, depression, unusual weight change, abnormal bleeding, enlarged lymph nodes, angioedema, breast masses, and testicular masses.    Physical Exam  General:  Well-developed,well-nourished,in no acute distress; alert,appropriate and cooperative throughout examination Head:  Normocephalic and atraumatic  without obvious abnormalities. No apparent alopecia or balding. Eyes:  No corneal or conjunctival inflammation noted.  EOMI. Perrla. Funduscopic exam benign, without hemorrhages, exudates or papilledema. Vision grossly normal. Ears:  External ear exam shows no significant lesions or deformities.  Otoscopic examination reveals clear canals, tympanic membranes are intact bilaterally without bulging, retraction, inflammation or discharge. Hearing is grossly normal bilaterally. Nose:  nasal congestion with yellow drainage as well as tenderness over maxillary sinuses bilaterally Mouth:  Oral mucosa and oropharynx without lesions or exudates.  Teeth in good repair. Neck:  No deformities, masses, or tenderness noted. Chest Wall:  No deformities, masses, tenderness or gynecomastia noted.pacemaker left anterior chest Breasts:  No masses or gynecomastia noted Lungs:  decreased breath sounds bilaterally as well as mild expiratory wheezes on deep inspirationno dullness.   Heart:  Normal rate and regular rhythm. S1 and S2 normal without gallop, murmur, click, rub or other extra sounds. Abdomen:  Bowel sounds positive,abdomen soft and non-tender without masses, organomegaly or hernias noted. Rectal:  No external abnormalities noted. Normal sphincter tone. No rectal masses or tenderness. Genitalia:  Testes bilaterally descended without nodularity, tenderness or masses. No scrotal masses or lesions. No penis lesions or urethral discharge. Prostate:  1+ enlarged.   Msk:  R. tenderness over the right hip Pulses:  R and L carotid,radial,femoral,dorsalis pedis and posterior tibial pulses are full and equal bilaterally Extremities:  No clubbing, cyanosis, edema, or deformity noted with normal full range of motion of all joints.   Neurologic:  No cranial nerve deficits noted. Station and gait are normal. Plantar reflexes are down-going bilaterally. DTRs are symmetrical throughout. Sensory, motor and coordinative functions appear intact. Skin:  Intact without suspicious lesions or rashes Cervical Nodes:  No lymphadenopathy  noted Axillary Nodes:  No palpable lymphadenopathy Inguinal Nodes:  No significant adenopathy Psych:  Cognition and judgment appear intact. Alert and cooperative with normal attention span and concentration. No apparent delusions, illusions, hallucinations   Impression & Recommendations:  Problem # 1:  SINUSITIS - ACUTE-NOS (ICD-461.9) Assessment New  Problem # 2:  ATRIAL FIBRILLATION, PAROXYSMAL (ICD-427.31) Assessment: Improved  His updated medication list for this problem includes:    Adult Aspirin Ec Low Strength 81 Mg Tbec (Aspirin) ..... Once daily    Warfarin Sodium 5 Mg Tabs (Warfarin sodium) .Marland Kitchen... 7.5 mg. 4 days weekly 5 mg. 3xweekly  Problem # 3:  CAD (ICD-414.00) Assessment: Improved  His updated medication list for this problem includes:    Adult Aspirin Ec Low Strength 81 Mg Tbec (Aspirin) ..... Once daily    Isosorbide Mononitrate Cr 30 Mg Xr24h-tab (Isosorbide mononitrate) .Marland Kitchen... Take 1/2 by mouth once daily  Problem # 5:  HYPERTENSION, BENIGN (ICD-401.1) Assessment: Improved  Problem # 6:  BACK PAIN, CHRONIC (ICD-724.5) Assessment: Improved  His updated medication list for this problem includes:    Adult Aspirin Ec Low Strength 81 Mg Tbec (Aspirin) ..... Once daily    Celebrex 200 Mg Caps (Celecoxib) .Marland Kitchen... 1 two times a day for arthritis  Problem # 7:  ERECTILE DYSFUNCTION (ICD-302.72) Assessment: Unchanged  His updated medication list for this problem includes:    Viagra 100 Mg Tabs (Sildenafil citrate) .Marland Kitchen... 1  as instructed  Problem # 8:  ARTHRITIS, HIP (ICD-716.95) Assessment: Deteriorated  prednisone 20 mg tablets decreased dosage  Orders: Prescription Created Electronically [phone])  Problem # 9:  RESTLESS LEG SYNDROME (ICD-333.94) Assessment: Improved  Problem # 10:  INSOMNIA (ICD-780.52) Assessment: Deteriorated  The following medications were removed from the  medication list:    Restoril 30 Mg Caps (Temazepam) .Marland Kitchen... 1 hs for sleep His  updated medication list for this problem includes:    Silenor 3 Mg Tabs (Doxepin hcl) .Marland Kitchen... 1-2 hs for sleep  Complete Medication List: 1)  Adult Aspirin Ec Low Strength 81 Mg Tbec (Aspirin) .... Once daily 2)  Isosorbide Mononitrate Cr 30 Mg Xr24h-tab (Isosorbide mononitrate) .... Take 1/2 by mouth once daily 3)  Flomax 0.4 Mg Cp24 (Tamsulosin hcl) .Marland Kitchen.. 1 two times a day 4)  Requip 4 Mg Tabs (Ropinirole hcl) .Marland Kitchen.. 1 tab 1 hr before bedtime and 1/2 tab each morning 5)  Prilosec 40 Mg Cpdr (Omeprazole) .... Take am and pm 6)  Allegra 180 Mg Tabs (Fexofenadine hcl) .... One tab daily 7)  Vitamin D 5000 Unit Caps (ergocalciferol)  .Marland Kitchen.. 1 by mouth weekly 8)  Simvastatin 10 Mg Tabs (Simvastatin) .... Take one tablet by mouth daily at bedtime 9)  Ventolin Hfa 108 (90 Base) Mcg/act Aers (Albuterol sulfate) .... 2 puffs by mouth three times a day as needed for asthma 10)  Warfarin Sodium 5 Mg Tabs (Warfarin sodium) .... 7.5 mg. 4 days weekly 5 mg. 3xweekly 11)  Epipen 2-pak 0.3 Mg/0.67ml (1:1000) Devi (Epinephrine hcl (anaphylaxis)) .... Use as directed 12)  Viagra 100 Mg Tabs (Sildenafil citrate) .Marland Kitchen.. 1  as instructed 13)  Celebrex 200 Mg Caps (Celecoxib) .Marland Kitchen.. 1 two times a day for arthritis 14)  Prednisone 20 Mg Tabs (Prednisone) .Marland Kitchen.. 1 two times a day pc for 1 week then 1 once daily for hip arthritis 15)  Silenor 3 Mg Tabs (Doxepin hcl) .Marland Kitchen.. 1-2 hs for sleep  Other Orders: UA Dipstick w/o Micro (automated)  (81003) Venipuncture (81191) T-Vitamin D (25-Hydroxy) (47829-56213) TLB-Lipid Panel (80061-LIPID) TLB-BMP (Basic Metabolic Panel-BMET) (80048-METABOL) TLB-CBC Platelet - w/Differential (85025-CBCD) TLB-Hepatic/Liver Function Pnl (80076-HEPATIC) TLB-TSH (Thyroid Stimulating Hormone) (84443-TSH) TLB-PSA (Prostate Specific Antigen) (84153-PSA)  Patient Instructions: 1)  take medications as prescribed 2)  We'll call results of lab studies Prescriptions: PREDNISONE 20 MG TABS (PREDNISONE) 1  two times a day PC FOR 1 WEEK THEN 1 once daily FOR HIP ARTHRITIS  #36 x 5   Entered and Authorized by:   Judithann Sheen MD   Signed by:   Judithann Sheen MD on 11/25/2009   Method used:   Electronically to        [id number]* (retail)       [address]       [place], [place]  [address]       Ph: [phone] or [phone]       Fax: [phone]   RxID:   [id number] SIMVASTATIN 10 MG TABS (SIMVASTATIN) Take one tablet by mouth daily at bedtime  #30 x 11   Entered and Authorized by:   Judithann Sheen MD   Signed by:   Judithann Sheen MD on 11/25/2009   Method used:   Electronically to        [id number]* (retail)       [address]       [place], [place]  [address]       Ph: [phone] or [phone]       Fax: [phone]   RxID:   [id number] ALLEGRA 180 MG TABS (FEXOFENADINE HCL) one tab daily  #30 x 11  Entered and Authorized by:   Judithann Sheen MD   Signed by:   Judithann Sheen MD on 11/25/2009   Method used:   Electronically to        [id number]* (retail)       [address]       [place], [place]  [address]       Ph: [phone] or [phone]       Fax: [phone]   RxID:   [id number] PRILOSEC 40 MG  CPDR (OMEPRAZOLE) take AM and PM  #60 x 11   Entered and Authorized by:   Judithann Sheen MD   Signed by:   Judithann Sheen MD on 11/25/2009   Method used:   Electronically to        [id number] (retail)       [address]       [place], [place]  [address]       Ph: [phone] or [phone]       Fax: [phone]   RxID:   [id number] REQUIP 4 MG  TABS (ROPINIROLE HCL) 1 tab 1 hr before bedtime and 1/2 tab each morning  #45 x 11   Entered and Authorized by:   Judithann Sheen MD   Signed by:   Judithann Sheen MD on 11/25/2009   Method  used:   Electronically to        [id number] (retail)       [address]       [place], [place]  [address]       Ph: [phone] or [phone]       Fax: [phone]   RxID:   [id number] FLOMAX 0.4 MG  CP24 (TAMSULOSIN HCL) 1 two times a day  #60 x 11   Entered and Authorized by:   Judithann Sheen MD   Signed by:   Judithann Sheen MD on 11/25/2009   Method used:   Electronically to        [id number] (retail)       [address]       [place], [place]  [address]       Ph: [phone] or [phone]       Fax: [phone]   RxID:   [id number]   Prevention & Chronic Care Immunizations   Influenza vaccine: Fluvax 3+  (08/10/2009)   Influenza vaccine due: 06/11/2009    Tetanus booster: 09/05/2005: Historical    Pneumococcal vaccine: Not documented    H. zoster vaccine: Not documented  Colorectal Screening   Hemoccult: Not documented    Colonoscopy: Not documented  Other Screening   PSA: 1.49  (11/05/2008)   PSA ordered.   PSA due due: 12/06/2007   Smoking status: quit  (07/20/2009)  Lipids   Total Cholesterol: 139  (11/05/2008)   LDL: 81  (11/05/2008)   LDL Direct: Not documented   HDL: 46.4  (11/05/2008)   Triglycerides: 60  (11/05/2008)    SGOT (AST): 19  (11/05/2008)   SGPT (ALT): 15  (11/05/2008)   Alkaline phosphatase: 64  (11/05/2008)   Total bilirubin: 0.9  (11/05/2008)  Hypertension  Last Blood Pressure: 110 / 64  (11/25/2009)   Serum creatinine: 1.1  (06/23/2009)   Serum potassium 4.5  (06/23/2009)  Self-Management Support :    Hypertension self-management support: Not documented    Lipid self-management support: Not documented    Laboratory Results   Urine Tests    Routine Urinalysis   Color: yellow Appearance: Clear Glucose: negative   (Normal Range: Negative) Bilirubin: negative   (Normal Range: Negative) Ketone: negative    (Normal Range: Negative) Spec. Gravity: 1.025   (Normal Range: 1.003-1.035) Blood: negative   (Normal Range: Negative) pH: 5.0   (Normal Range: 5.0-8.0) Protein: negative   (Normal Range: Negative) Urobilinogen: 0.2   (Normal Range: 0-1) Nitrite: negative   (Normal Range: Negative) Leukocyte Esterace: negative   (Normal Range: Negative)    Comments: [name] [name]  November 25, 2009 2:53 PM

## 2010-10-05 NOTE — Progress Notes (Signed)
Summary: Pt req samples of Celebrex. Has to have this today.  Phone Note Call from Patient Call back at Home Phone [phone]   Caller: Patient Summary of Call: Pt req samples of Celebrex to last until mail order arrives.     Initial call taken by: Lucy Antigua,  March 29, 2010 11:49 AM  Follow-up for Phone Call         no samples pt notified and will call in [facility] [address]  Follow-up by: Pura Spice, RN,  March 30, 2010 8:24 AM    Prescriptions: CELEBREX 200 MG CAPS (CELECOXIB) 1 two times a day for arthritis  #30 x 0   Entered by:   Pura Spice, RN   Authorized by:   Judithann Sheen MD   Signed by:   Pura Spice, RN on 03/30/2010   Method used:   Electronically to        [id number] (retail)       [address]       [place], [place]  [address]       Ph: [phone] or [phone]       Fax: [phone]   RxID:   [id number]

## 2010-10-05 NOTE — Letter (Signed)
Summary: Generic Letter  [facility]  [address] [place], [place] [address]   Phone: [phone]  Fax: [phone]    12/22/2009  JOSEANGEL NETTLETON [address] [place], [place]  [address]  Dear Mr. JOU,   Hemocult cards were all negative.         Sincerely,   DR Gwenyth Bender Stafford,MD
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
## 2010-10-05 NOTE — Medication Information (Signed)
Summary: Ventolin HFA Approved  Ventolin HFA Approved   Imported By: Maryln Gottron 05/18/2010 14:50:59  _____________________________________________________________________  External Attachment:    Type:   Image     Comment:   External Document

## 2010-10-05 NOTE — Progress Notes (Signed)
Summary: rx pro air to [facility]   Phone Note Call from Patient   Caller: Patient Summary of Call: pt called to stated copay to expensive on the ventolin inhaler requesting proair.  [facility]  Initial call taken by: Pura Spice, RN,  April 29, 2010 3:47 PM  Follow-up for Phone Call        ok'd by dr Scotty Court to call in proarir inhaler  Follow-up by: Pura Spice, RN,  April 29, 2010 3:48 PM    New/Updated Medications: PROAIR HFA 108 (90 BASE) MCG/ACT AERS (ALBUTEROL SULFATE) use 1 to 2 puffs every 4-6 hrs as needed Prescriptions: PROAIR HFA 108 (90 BASE) MCG/ACT AERS (ALBUTEROL SULFATE) use 1 to 2 puffs every 4-6 hrs as needed  #3 x 1   Entered by:   Pura Spice, RN   Authorized by:   Judithann Sheen MD   Signed by:   Pura Spice, RN on 04/29/2010   Method used:   Telephoned to ...       [facility] (mail-order)       [address]       [place], [place]  [address]       Ph: [phone]       Fax: [phone]   RxID:   [id number]

## 2010-10-05 NOTE — Medication Information (Signed)
Summary: rov/tm  Anticoagulant Therapy  Managed by: Bethena Midget, RN, BSN Referring MD: Johney Frame MD, Fayrene Fearing PCP: Judithann Sheen MD Supervising MD: Shirlee Latch MD, Dalton Indication 1: Atrial Fibrillation Lab Used: [facility] [place] Site: [address] INR POC 2.3 INR RANGE 2.0-3.0  Dietary changes: no    Health status changes: no    Bleeding/hemorrhagic complications: no    Recent/future hospitalizations: no    Any changes in medication regimen? no    Recent/future dental: no  Any missed doses?: no       Is patient compliant with meds? yes       Allergies: 1)  ! Pcn 2)  Amoxicillin (Amoxicillin)  Anticoagulation Management History:      The patient is taking warfarin and comes in today for a routine follow up visit.  Positive risk factors for bleeding include an age of 75 years or older.  The bleeding index is 'intermediate risk'.  Positive CHADS2 values include History of HTN and Age > 75 years old.  His last INR was 1.1 ratio.  Anticoagulation responsible provider: Shirlee Latch MD, Dalton.  INR POC: 2.3.  Cuvette Lot#: [id number].  Exp: 10/2010.    Anticoagulation Management Assessment/Plan:      The patient's current anticoagulation dose is Warfarin sodium 5 mg tabs: 7.5 mg. 4 days weekly 5 mg. 3xweekly.  The target INR is 2.0-3.0.  The next INR is due 10/07/2009.  Anticoagulation instructions were given to patient.  Results were reviewed/authorized by Bethena Midget, RN, BSN.  He was notified by Bethena Midget, RN, BSN.         Prior Anticoagulation Instructions: INR 2.1 Continue 1.5 tablets everyday except 1 tablet on Tuesdays, Thursdays, and Sundays. Recheck in 4 weeks.   Current Anticoagulation Instructions: INR 2.3 Continue 7.5mg s daily exept 5mg s on Tuesdays, Thursdays, and Sundays. Recheck in 4 weeks.

## 2010-10-05 NOTE — Medication Information (Signed)
Summary: rov/eac  Anticoagulant Therapy  Managed by: Cloyde Reams, RN, BSN Referring MD: Johney Frame MD, Fayrene Fearing PCP: Judithann Sheen MD Supervising MD: Eden Emms MD, Theron Arista Indication 1: Atrial Fibrillation Lab Used: [facility] [place] Site: [address] INR POC 2.2 INR RANGE 2.0-3.0    Bleeding/hemorrhagic complications: no    Recent/future hospitalizations: no    Any changes in medication regimen? no        Allergies: 1)  ! Pcn 2)  Amoxicillin (Amoxicillin)  Anticoagulation Management History:      The patient is taking warfarin and comes in today for a routine follow up visit.  Positive risk factors for bleeding include an age of 15 years or older.  The bleeding index is 'intermediate risk'.  Positive CHADS2 values include History of HTN and Age > 25 years old.  His last INR was 1.1 ratio.  Anticoagulation responsible provider: Eden Emms MD, Theron Arista.  INR POC: 2.2.  Cuvette Lot#: [id number].  Exp: 01/2011.    Anticoagulation Management Assessment/Plan:      The patient's current anticoagulation dose is Warfarin sodium 5 mg tabs: 7.5 mg. 4 days weekly 5 mg. 3xweekly.  The target INR is 2.0-3.0.  The next INR is due 12/23/2009.  Anticoagulation instructions were given to patient.  Results were reviewed/authorized by Cloyde Reams, RN, BSN.  He was notified by Cloyde Reams RN.         Prior Anticoagulation Instructions: INR 4.8  Do NOT take coumadin tomorrow or Friday.  Then return to normal dosing schedule of 1.5 tablets on Monday, wednesday, and Friday and 1 tablet all other days. Return to clinic in 2 weeks.     Current Anticoagulation Instructions: INR 2.2  Continue on same dosage 1 tablet daily except 1.5 tablets on Mondays, Wednesdays, and Fridays.  Recheck in 3 weeks.

## 2010-10-05 NOTE — Medication Information (Signed)
Summary: rov/tm  Anticoagulant Therapy  Managed by: Bethena Midget, RN, BSN Referring MD: Johney Frame MD, Fayrene Fearing PCP: Judithann Sheen MD Supervising MD: Shirlee Latch MD, Indigo Chaddock Indication 1: Atrial Fibrillation Lab Used: [facility] [place] Site: [address] INR POC 2.3 INR RANGE 2.0-3.0  Dietary changes: no    Health status changes: no    Bleeding/hemorrhagic complications: no    Recent/future hospitalizations: no    Any changes in medication regimen? no    Recent/future dental: no  Any missed doses?: no       Is patient compliant with meds? yes       Allergies: 1)  ! Pcn 2)  Amoxicillin (Amoxicillin)  Anticoagulation Management History:      The patient is taking warfarin and comes in today for a routine follow up visit.  Positive risk factors for bleeding include an age of 75 years or older.  The bleeding index is 'intermediate risk'.  Positive CHADS2 values include History of HTN and Age > 41 years old.  His last INR was 1.1 ratio.  Anticoagulation responsible provider: Shirlee Latch MD, Hoy Fallert.  INR POC: 2.3.  Cuvette Lot#: [id number].  Exp: 04/2011.    Anticoagulation Management Assessment/Plan:      The patient's current anticoagulation dose is Warfarin sodium 5 mg tabs: 7.5 mg. 4 days weekly 5 mg. 3xweekly.  The target INR is 2.0-3.0.  The next INR is due 02/17/2010.  Anticoagulation instructions were given to patient.  Results were reviewed/authorized by Bethena Midget, RN, BSN.  He was notified by Bethena Midget, RN, BSN.         Prior Anticoagulation Instructions: INR 2.3 Continue 5mg s daily except 7.5mg s on Mondays, Wednesdays and Fridays. Recheck in 4 weeks.  Current Anticoagulation Instructions: INR 2.3 Continue 5mg  daily except 7.5mg  on Mondays, Wednesdays and Fridays. Recheck in 4 weeks.

## 2010-10-05 NOTE — Letter (Signed)
Summary: [facility]  [facility]   Imported By: Maryln Gottron 01/04/2010 15:06:40  _____________________________________________________________________  External Attachment:    Type:   Image     Comment:   External Document

## 2010-10-05 NOTE — Progress Notes (Signed)
Summary: rx albuterol.   Phone Note From Pharmacy   [facility]  Summary of Call: pro air inhaler.  90 micrograms  Initial call taken by: Pura Spice, RN,  April 27, 2010 1:44 PM  Follow-up for Phone Call        ok per dr Scotty Court.  Follow-up by: Pura Spice, RN,  April 27, 2010 1:44 PM    New/Updated Medications: VENTOLIN HFA 108 (90 BASE) MCG/ACT AERS (ALBUTEROL SULFATE) use 1 to 2 puffs every 4 to 6 hrs as needed shortness of breath. Prescriptions: VENTOLIN HFA 108 (90 BASE) MCG/ACT AERS (ALBUTEROL SULFATE) use 1 to 2 puffs every 4 to 6 hrs as needed shortness of breath.  #3 x 3   Entered by:   Pura Spice, RN   Authorized by:   Judithann Sheen MD   Signed by:   Pura Spice, RN on 04/27/2010   Method used:   Faxed to ...       [facility] (mail-order)       [address]       [place], [place]  [address]       Ph: [phone]       Fax: [phone]   RxID:   [id number]

## 2010-10-05 NOTE — Medication Information (Signed)
Summary: rov/ewj  Anticoagulant Therapy  Managed by: Eda Keys, PharmD Referring MD: Johney Frame MD, Fayrene Fearing PCP: Judithann Sheen MD Supervising MD: Johney Frame MD, Fayrene Fearing Indication 1: Atrial Fibrillation Lab Used: [facility] [place] Site: [address] INR POC 2.3 INR RANGE 2.0-3.0  Dietary changes: no    Health status changes: no    Bleeding/hemorrhagic complications: no    Recent/future hospitalizations: no    Any changes in medication regimen? no    Recent/future dental: no  Any missed doses?: no       Is patient compliant with meds? yes       Allergies: 1)  ! Pcn 2)  Amoxicillin (Amoxicillin)  Anticoagulation Management History:      The patient is taking warfarin and comes in today for a routine follow up visit.  Positive risk factors for bleeding include an age of 75 years or older.  The bleeding index is 'intermediate risk'.  Positive CHADS2 values include History of HTN and Age > 21 years old.  His last INR was 1.1 ratio.  Anticoagulation responsible provider: Dee Maday MD, Fayrene Fearing.  INR POC: 2.3.  Cuvette Lot#: [id number].  Exp: 12/2010.    Anticoagulation Management Assessment/Plan:      The patient's current anticoagulation dose is Warfarin sodium 5 mg tabs: 7.5 mg. 4 days weekly 5 mg. 3xweekly.  The target INR is 2.0-3.0.  The next INR is due 11/18/2009.  Anticoagulation instructions were given to patient.  Results were reviewed/authorized by Eda Keys, PharmD.  He was notified by Eda Keys.         Prior Anticoagulation Instructions: INR 3.7  Skip today's dosage of coumadin then start taking 1 tablet daily except 1.5 tablets on MOndays, Wednesdays, and Fridays.   Recheck in 2 weeks.    Current Anticoagulation Instructions: INR 2.3  Continue current dosing schedule.  Take 1.5 tablets on monday, Wednesday, and Friday, and take 1 tablet all other days. Return to clinic in 4 weeks.
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
## 2010-10-05 NOTE — Medication Information (Signed)
Summary: rov/eac  Anticoagulant Therapy  Managed by: Eda Keys, PharmD Referring MD: Johney Frame MD, Fayrene Fearing PCP: Judithann Sheen MD Supervising MD: Johney Frame MD, Fayrene Fearing Indication 1: Atrial Fibrillation Lab Used: [facility] [place] Site: [address] INR POC 4.8 INR RANGE 2.0-3.0  Dietary changes: yes       Details: Pt was in [place] and says his diet was inconsistent while on this trip  Health status changes: no    Bleeding/hemorrhagic complications: no    Recent/future hospitalizations: no    Any changes in medication regimen? no    Recent/future dental: no  Any missed doses?: no       Is patient compliant with meds? yes      Comments: Have encouraged pt to RTC in one week, however he requested two weeks.  I have explained the need for earlier monitoring, and have instructed patient to call the clinic with any s/sx of bleeding or other issues.  Allergies: 1)  ! Pcn 2)  Amoxicillin (Amoxicillin)  Anticoagulation Management History:      The patient is taking warfarin and comes in today for a routine follow up visit.  Positive risk factors for bleeding include an age of 75 years or older.  The bleeding index is 'intermediate risk'.  Positive CHADS2 values include History of HTN and Age > 70 years old.  His last INR was 1.1 ratio.  Anticoagulation responsible provider: Lenka Zhao MD, Fayrene Fearing.  INR POC: 4.8.  Cuvette Lot#: [id number].  Exp: 01/2011.    Anticoagulation Management Assessment/Plan:      The patient's current anticoagulation dose is Warfarin sodium 5 mg tabs: 7.5 mg. 4 days weekly 5 mg. 3xweekly.  The target INR is 2.0-3.0.  The next INR is due 12/02/2009.  Anticoagulation instructions were given to patient.  Results were reviewed/authorized by Eda Keys, PharmD.  He was notified by Eda Keys.         Prior Anticoagulation Instructions: INR 2.3  Continue current dosing schedule.  Take 1.5 tablets on monday, Wednesday, and Friday, and take 1  tablet all other days. Return to clinic in 4 weeks.  Current Anticoagulation Instructions: INR 4.8  Do NOT take coumadin tomorrow or Friday.  Then return to normal dosing schedule of 1.5 tablets on Monday, wednesday, and Friday and 1 tablet all other days. Return to clinic in 2 weeks.

## 2010-10-05 NOTE — Progress Notes (Signed)
Summary: samples needed  Phone Note Call from Patient Call back at Home Phone [phone]   Caller: Patient---live call Summary of Call: need samples of zocor, flomax until mail order arrives. he is out. Initial call taken by: Warnell Forester,  March 10, 2010 10:30 AM  Follow-up for Phone Call        no samples of either as they are generic will need to call in month supply of both  [facility]  Follow-up by: Pura Spice, RN,  March 10, 2010 10:51 AM  Additional Follow-up for Phone Call Additional follow up Details #1::        Pt is having problems getting drugs from mail order.  Will call us back if he decides on a different pharmacy.  Additional Follow-up by: Lynann Beaver CMA,  March 10, 2010 10:59 AM

## 2010-10-05 NOTE — Miscellaneous (Signed)
Summary: Health Care Power of Attorney & Living Will  Health Care Power of Attorney & Living Will   Imported By: Maryln Gottron 06/03/2010 08:34:24  _____________________________________________________________________  External Attachment:    Type:   Image     Comment:   External Document

## 2010-10-05 NOTE — Medication Information (Signed)
Summary: rov/tm  Anticoagulant Therapy  Managed by: Weston Brass, PharmD Referring MD: Johney Frame MD, Fayrene Fearing PCP: Judithann Sheen MD Supervising MD: Johney Frame MD, Fayrene Fearing Indication 1: Atrial Fibrillation Lab Used: [facility] [place] Site: [address] INR POC 2.4 INR RANGE 2.0-3.0  Dietary changes: no    Health status changes: no    Bleeding/hemorrhagic complications: no    Recent/future hospitalizations: no    Any changes in medication regimen? no    Recent/future dental: no  Any missed doses?: no       Is patient compliant with meds? yes       Allergies: 1)  ! Pcn 2)  Amoxicillin (Amoxicillin)  Anticoagulation Management History:      The patient is taking warfarin and comes in today for a routine follow up visit.  Positive risk factors for bleeding include an age of 75 years or older.  The bleeding index is 'intermediate risk'.  Positive CHADS2 values include History of HTN and Age > 43 years old.  His last INR was 1.1 ratio.  Anticoagulation responsible provider: Affan Callow MD, Fayrene Fearing.  INR POC: 2.4.  Cuvette Lot#: [id number].  Exp: 05/2011.    Anticoagulation Management Assessment/Plan:      The patient's current anticoagulation dose is Warfarin sodium 5 mg tabs: Use as directed by Anticoagulation Clinic.  The target INR is 2.0-3.0.  The next INR is due 04/14/2010.  Anticoagulation instructions were given to patient.  Results were reviewed/authorized by Weston Brass, PharmD.  He was notified by Weston Brass PharmD.         Prior Anticoagulation Instructions: INR 2.3 Continue 5mg s daily except 7.5mg s on Mondays, Wednesdays and Fridays. Recheck in 4 weeks.   Current Anticoagulation Instructions: INR 2.4  Continue same dose of 1 tablet every day except 1 1/2 tablets on Monday, Wednesday and Friday.

## 2010-10-05 NOTE — Assessment & Plan Note (Signed)
Summary: R chest and R arm pain   Vital Signs:  Patient profile:   77 year old male Weight:      169 pounds BMI:     25.05 O2 Sat:      98 % on Room air Temp:     98.2 degrees F Pulse rate:   66 / minute BP sitting:   110 / 60  (left arm)  Vitals Entered By: Pura Spice, RN (October 22, 2009 2:09 PM)  O2 Flow:  Room air CC:  rt sided chest pain with rt shoulder pain. alot dyspepsia review meds  Is Patient Diabetic? No   History of Present Illness: This 77 year old white male began having pain in his right chest radiating to his shoulder approximately 3 days ago. He notices the pain when he moves when he is lying down and getting out of bed or if he is lifting anything with his right arm. He also relates he has a sore throat He states he has considerable amount of burping after eating most any type of food, He has had no exertional pain  Nor dyspnea Continues to take Coumadin in regard to his having paroxysmal atrial fib Restless leg syndrome his control with his medication COPD symptoms have improved since he stopped smoking Continues to have problem with erectile dysfunction and needs a refill on his Viagra, requests some samples   Allergies: 1)  ! Pcn 2)  Amoxicillin (Amoxicillin)  Past History:  Past Medical History: Last updated: 06/17/2009 CAD (ICD-414.00) SYNCOPE (ICD-780.2) HYPERTENSION, BENIGN (ICD-401.1) BACK PAIN, CHRONIC (ICD-724.5) VITAMIN D DEFICIENCY (ICD-268.9) SPECIAL SCREENING MALIGNANT NEOPLASM OF PROSTATE (ICD-V76.44) HYPOTHYROIDISM (ICD-244.9) ANEMIA (ICD-285.9) ERECTILE DYSFUNCTION (ICD-302.72) ARTHRITIS, HIP (ICD-716.95) RESTLESS LEG SYNDROME (ICD-333.94) CORONARY ATHEROSCLEROSIS, ARTERY BYPASS GRAFT (ICD-414.04) BENIGN PROSTATIC HYPERTROPHY, HX OF (ICD-V13.8) GERD (ICD-530.81) FREQUENCY, URINARY (ICD-788.41) COPD (ICD-496) HYPERLIPIDEMIA (ICD-272.4) CAD, S/P CABG x2 and last cath, October 2006 with PCI to SVG with       non-flow  drug-eluting platform, continue patency of LIMA to LAD,       slightly hazy/irregular disease involving circumflex artery, which       was not involved in bypass, minimal decreased LV function.  Stable AAA.  History of 77-pack-year smoking, history quitting in November 2009.  Paroxysmal atrial fibrillation Mobtitz II AV block  Past Surgical History: Last updated: 01/21/2009 s/p CABG  Cataract extraction  Social History: Last updated: 01/16/2009  The patient lives in [place] with his girlfriend.   He is retired, although very active around the house.  He has a 77-pack-   year smoking history, quitting in November 2009.  He has a history of   alcohol abuse and continues to drink very moderately, no illicit drug   use.  No herbal medications.  He has a regular diet.  No regular   exercise, but very active.   Risk Factors: Smoking Status: quit (07/20/2009) Packs/Day: 1 (08/15/2007)  Review of Systems  The patient denies anorexia, fever, weight loss, weight gain, vision loss, decreased hearing, hoarseness, chest pain, syncope, dyspnea on exertion, peripheral edema, prolonged cough, headaches, hemoptysis, abdominal pain, melena, hematochezia, severe indigestion/heartburn, hematuria, incontinence, genital sores, muscle weakness, suspicious skin lesions, transient blindness, difficulty walking, depression, unusual weight change, abnormal bleeding, enlarged lymph nodes, angioedema, breast masses, and testicular masses.    Physical Exam  General:  Well-developed,well-nourished,in no acute distress; alert,appropriate and cooperative throughout examination Neck:  No deformities, masses, or tenderness noted. Chest Wall:  marked tenderness costochondral joints on the right chest  2through 7 Breasts:  No masses or gynecomastia noted Lungs:  Normal respiratory effort, chest expands symmetrically. Lungs are clear to auscultation, no crackles or wheezes. Heart:  Normal rate and regular rhythm.  S1 and S2 normal without gallop, murmur, click, rub or other extra sounds. Abdomen:  Bowel sounds positive,abdomen soft and non-tender without masses, organomegaly or hernias noted.   Impression & Recommendations:  Problem # 1:  COSTOCHONDRITIS (ICD-733.6) Assessment New  Orders: Depo- Medrol 40mg  (J1030) Admin of Therapeutic Inj  intramuscular or subcutaneous (95188)  Problem # 2:  ATRIAL FIBRILLATION, PAROXYSMAL (ICD-427.31) Assessment: Improved  His updated medication list for this problem includes:    Adult Aspirin Ec Low Strength 81 Mg Tbec (Aspirin) ..... Once daily    Warfarin Sodium 5 Mg Tabs (Warfarin sodium) .Marland Kitchen... 7.5 mg. 4 days weekly 5 mg. 3xweekly  Problem # 3:  CAD (ICD-414.00) Assessment: Improved  His updated medication list for this problem includes:    Adult Aspirin Ec Low Strength 81 Mg Tbec (Aspirin) ..... Once daily    Isosorbide Mononitrate Cr 30 Mg Xr24h-tab (Isosorbide mononitrate) .Marland Kitchen... Take 1/2 by mouth once daily  Problem # 4:  HYPERTENSION, BENIGN (ICD-401.1) Assessment: Improved  Problem # 5:  BACK PAIN, CHRONIC (ICD-724.5) Assessment: Improved  His updated medication list for this problem includes:    Adult Aspirin Ec Low Strength 81 Mg Tbec (Aspirin) ..... Once daily    Celebrex 200 Mg Caps (Celecoxib) .Marland Kitchen... 1 two times a day for arthritis  Problem # 6:  ERECTILE DYSFUNCTION (ICD-302.72) Assessment: Unchanged  The following medications were removed from the medication list:    Cialis 20 Mg Tabs (Tadalafil) .Marland Kitchen... 1 tab as instructed His updated medication list for this problem includes:    Viagra 100 Mg Tabs (Sildenafil citrate) .Marland Kitchen... 1  as instructed  Problem # 7:  RESTLESS LEG SYNDROME (ICD-333.94) Assessment: Improved  Problem # 8:  GERD (ICD-530.81) Assessment: Deteriorated  His updated medication list for this problem includes:    Prilosec 40 Mg Cpdr (Omeprazole) .Marland Kitchen... Take am and pm  Problem # 9:  COPD (ICD-496) Assessment:  Improved  His updated medication list for this problem includes:    Ventolin Hfa 108 (90 Base) Mcg/act Aers (Albuterol sulfate) .Marland Kitchen... 2 puffs by mouth three times a day as needed for asthma  Complete Medication List: 1)  Adult Aspirin Ec Low Strength 81 Mg Tbec (Aspirin) .... Once daily 2)  Isosorbide Mononitrate Cr 30 Mg Xr24h-tab (Isosorbide mononitrate) .... Take 1/2 by mouth once daily 3)  Flomax 0.4 Mg Cp24 (Tamsulosin hcl) .Marland Kitchen.. 1 two times a day 4)  Requip 4 Mg Tabs (Ropinirole hcl) .Marland Kitchen.. 1 tab 1 hr before bedtime and 1/2 tab each morning 5)  Restoril 30 Mg Caps (Temazepam) .Marland Kitchen.. 1 hs for sleep 6)  Prilosec 40 Mg Cpdr (Omeprazole) .... Take am and pm 7)  Allegra 180 Mg Tabs (Fexofenadine hcl) .... One tab daily 8)  Vitamin D 5000 Unit Caps (ergocalciferol)  .Marland Kitchen.. 1 by mouth weekly 9)  Simvastatin 10 Mg Tabs (Simvastatin) .... Take one tablet by mouth daily at bedtime 10)  Ventolin Hfa 108 (90 Base) Mcg/act Aers (Albuterol sulfate) .... 2 puffs by mouth three times a day as needed for asthma 11)  Warfarin Sodium 5 Mg Tabs (Warfarin sodium) .... 7.5 mg. 4 days weekly 5 mg. 3xweekly 12)  Epipen 2-pak 0.3 Mg/0.22ml (1:1000) Devi (Epinephrine hcl (anaphylaxis)) .... Use as directed 13)  Viagra 100 Mg Tabs (Sildenafil citrate) .Marland KitchenMarland KitchenMarland Kitchen 1  as instructed 14)  Celebrex 200 Mg Caps (Celecoxib) .Marland Kitchen.. 1 two times a day for arthritis  Other Orders: Depo- Medrol 80mg  (J1040) Prescription Created Electronically [phone])  Patient Instructions: 1)  You have costochondritis which is inflammation of the costochondral joint on the right side of the chest and the year she referred pain to the right shoulder uric movement aggravates his problem 2)  You have increased symptoms of GERD and I recommend  that you increase Prilosec to one twice daily 3)  the injection and for inflammation and the medication is Depo-Medrol 120 mg IM Prescriptions: PRILOSEC 40 MG  CPDR (OMEPRAZOLE) take AM and PM  #60 x 11   Entered  and Authorized by:   Judithann Sheen MD   Signed by:   Judithann Sheen MD on 10/26/2009   Method used:   Print then Give to Patient   RxID:   [id number] CELEBREX 200 MG CAPS (CELECOXIB) 1 two times a day for arthritis  #60 x 11   Entered and Authorized by:   Judithann Sheen MD   Signed by:   Judithann Sheen MD on 10/22/2009   Method used:   Electronically to        [id number]* (retail)       [address]       [place], [place]  [address]       Ph: [phone] or [phone]       Fax: [phone]   RxID:   [id number]    Medication Administration  Injection # 1:    Medication: Depo- Medrol 80mg     Diagnosis: COSTOCHONDRITIS (ICD-733.6)    Route: IM    Site: RUOQ gluteus    Exp Date: 05/2012    Lot #: [id number]    Mfr: Pharmacia    Comments: given by [employer] powers student at [employer]     Patient tolerated injection without complications    Given by: [employer] powers cma student gtcc  Injection # 2:    Medication: Depo- Medrol 40mg     Diagnosis: COSTOCHONDRITIS (ICD-733.6)    Route: IM    Site: RUOQ gluteus    Exp Date: 05/2012    Lot #: [id number]    Mfr: Pharmacia    Patient tolerated injection without complications    Given by: jennifer powers cma student gt[employer]  Orders Added: 1)  Depo- Medrol 80mg  [J1040] 2)  Depo- Medrol 40mg  [J1030] 3)  Admin of Therapeutic Inj  intramuscular or subcutaneous [96372] 4)  Prescription Created Electronically [G8553] 5)  Est. Patient Level IV [88416]

## 2010-10-05 NOTE — Progress Notes (Signed)
Summary: Pt changing pharmacy to [facility] mail order  Phone Note Call from Patient Call back at Home Phone [phone]   Caller: Patient Summary of Call: Pt says that he is going to start get script thru mail order. Please contact [facility] and order Tansulosin, Temazepam, Omeprazole, Celebrex, Simvastatin Phone [phone]. Req 90 day supply on all these.  Initial call taken by: Lucy Antigua,  February 24, 2010 10:51 AM  Follow-up for Phone Call        ok per dr Scotty Court. refills  at [facility] for these meds called and cx. new rx  faxed to  [facility]. Follow-up by: Pura Spice, RN,  February 24, 2010 11:08 AM    New/Updated Medications: FLOMAX 0.4 MG  CP24 (TAMSULOSIN HCL) 1 two times a day PRILOSEC 40 MG  CPDR (OMEPRAZOLE) take 1  AM and PM CELEBREX 200 MG CAPS (CELECOXIB) 1 two times a day for arthritis Prescriptions: SIMVASTATIN 10 MG TABS (SIMVASTATIN) Take one tablet by mouth daily at bedtime  #90 x 3   Entered by:   Pura Spice, RN   Authorized by:   Judithann Sheen MD   Signed by:   Pura Spice, RN on 02/24/2010   Method used:   Printed then faxed to ...       [facility] (mail-order)       [address]       [place], [place]  [address]       Ph: [phone]       Fax: [phone]   RxID:   [id number] CELEBREX 200 MG CAPS (CELECOXIB) 1 two times a day for arthritis  #180 x 3   Entered by:   Pura Spice, RN   Authorized by:   Judithann Sheen MD   Signed by:   Pura Spice, RN on 02/24/2010   Method used:   Printed then faxed to ...       [facility]* (mail-order)       [address]       [place], [place]  [address]       Ph: [phone]       Fax: [phone]   RxID:   [id number] PRILOSEC 40 MG  CPDR (OMEPRAZOLE) take 1  AM and PM  #180 x 3   Entered by:   Pura Spice, RN   Authorized by:   Judithann Sheen MD   Signed by:   Pura Spice, RN on 02/24/2010  Method used:   Printed then faxed to ...       [facility]* (mail-order)       [address]       [place], [place]  [address]       Ph: [phone]       Fax: [phone]   RxID:   [id number] TEMAZEPAM 30 MG CAPS (TEMAZEPAM) Take 1 tab by mouth at bedtime  #90 x 1   Entered by:   Pura Spice, RN   Authorized by:   Judithann Sheen MD   Signed by:   Pura Spice, RN on 02/24/2010   Method used:   Printed then faxed to ...       [facility]* (mail-order)       [address]       [place], [place]  [address]       Ph: [phone]  Fax: [phone]   RxID:   [id number] FLOMAX 0.4 MG  CP24 (TAMSULOSIN HCL) 1 two times a day  #180 x 3   Entered by:   Pura Spice, RN   Authorized by:   Judithann Sheen MD   Signed by:   Pura Spice, RN on 02/24/2010   Method used:   Printed then faxed to ...       [facility]* (mail-order)       [address]       [place], [place]  [address]       Ph: [phone]       Fax: [phone]   RxID:   [id number]

## 2010-10-05 NOTE — Medication Information (Signed)
Summary: rov/sp  Anticoagulant Therapy  Managed by: Weston Brass, PharmD Referring MD: Johney Frame MD, Fayrene Fearing PCP: Judithann Sheen MD Supervising MD: Jens Som MD, Arlys John Indication 1: Atrial Fibrillation Lab Used: [facility] [place] Site: [address] INR POC 1.9 INR RANGE 2.0-3.0  Dietary changes: no    Health status changes: no    Bleeding/hemorrhagic complications: no    Recent/future hospitalizations: no    Any changes in medication regimen? no    Recent/future dental: no  Any missed doses?: no       Is patient compliant with meds? yes       Allergies: 1)  ! Pcn 2)  Amoxicillin (Amoxicillin)  Anticoagulation Management History:      The patient is taking warfarin and comes in today for a routine follow up visit.  Positive risk factors for bleeding include an age of 53 years or older.  The bleeding index is 'intermediate risk'.  Positive CHADS2 values include History of HTN and Age > 40 years old.  His last INR was 1.1 ratio.  Anticoagulation responsible provider: Jens Som MD, Arlys John.  INR POC: 1.9.  Cuvette Lot#: [id number].  Exp: 05/2011.    Anticoagulation Management Assessment/Plan:      The patient's current anticoagulation dose is Warfarin sodium 5 mg tabs: Use as directed by Anticoagulation Clinic.  The target INR is 2.0-3.0.  The next INR is due 05/12/2010.  Anticoagulation instructions were given to patient.  Results were reviewed/authorized by Weston Brass, PharmD.  He was notified by Gweneth Fritter, PharmD Candidate.         Prior Anticoagulation Instructions: INR 2.4  Continue same dose of 1 tablet every day except 1 1/2 tablets on Monday, Wednesday and Friday.   Current Anticoagulation Instructions: INR 1.9  Take 2 tablets tonight (10mg ).  Then resume normal schedule of 1.5 tablets (7.5mg ) every day except take 1 tablet (5mg ) on Mondays, Wednesdays, and Fridays.

## 2010-10-05 NOTE — Assessment & Plan Note (Signed)
Summary: f1y   Visit Type:  1 year follow up Referring Provider:  Bonnee Quin, MD Primary Provider:  Judithann Sheen MD  CC:  Some Sob.  History of Present Illness: Patient is doing ok.  Not having any chest pain.  Patient has been working pretty hard.  Quit smoking two years ago.     Current Medications (verified): 1)  Adult Aspirin Ec Low Strength 81 Mg  Tbec (Aspirin) .... Once Daily 2)  Isosorbide Mononitrate Cr 30 Mg Xr24h-Tab (Isosorbide Mononitrate) .... Take 1/2 By Mouth Once Daily 3)  Flomax 0.4 Mg  Cp24 (Tamsulosin Hcl) .Marland Kitchen.. 1 Two Times A Day 4)  Requip 4 Mg  Tabs (Ropinirole Hcl) .Marland Kitchen.. 1 Tab 1 Hr Before Bedtime and 1/2 Tab Each Morning 5)  Prilosec 40 Mg  Cpdr (Omeprazole) .... Take Am and Pm 6)  Allegra 180 Mg Tabs (Fexofenadine Hcl) .... One Tab Daily 7)  Simvastatin 10 Mg Tabs (Simvastatin) .... Take One Tablet By Mouth Daily At Bedtime 8)  Ventolin Hfa 108 (90 Base) Mcg/act Aers (Albuterol Sulfate) .... 2 Puffs By Mouth Three Times A Day As Needed For Asthma 9)  Warfarin Sodium 5 Mg Tabs (Warfarin Sodium) .... 7.5 Mg. 4 Days Weekly 5 Mg. 3xweekly 10)  Epipen 2-Pak 0.3 Mg/0.55ml (1:1000) Devi (Epinephrine Hcl (Anaphylaxis)) .... Use As Directed 11)  Viagra 100 Mg Tabs (Sildenafil Citrate) .Marland Kitchen.. 1  As Instructed 12)  Celebrex 200 Mg Caps (Celecoxib) .Marland Kitchen.. 1 Two Times A Day For Arthritis 13)  Temazepam 30 Mg Caps (Temazepam) .... Take 1 Tab By Mouth At Bedtime  Allergies: 1)  ! Pcn 2)  Amoxicillin (Amoxicillin)  Past History:  Past Medical History: Last updated: 06/17/2009 CAD (ICD-414.00) SYNCOPE (ICD-780.2) HYPERTENSION, BENIGN (ICD-401.1) BACK PAIN, CHRONIC (ICD-724.5) VITAMIN D DEFICIENCY (ICD-268.9) SPECIAL SCREENING MALIGNANT NEOPLASM OF PROSTATE (ICD-V76.44) HYPOTHYROIDISM (ICD-244.9) ANEMIA (ICD-285.9) ERECTILE DYSFUNCTION (ICD-302.72) ARTHRITIS, HIP (ICD-716.95) RESTLESS LEG SYNDROME (ICD-333.94) CORONARY ATHEROSCLEROSIS, ARTERY BYPASS GRAFT  (ICD-414.04) BENIGN PROSTATIC HYPERTROPHY, HX OF (ICD-V13.8) GERD (ICD-530.81) FREQUENCY, URINARY (ICD-788.41) COPD (ICD-496) HYPERLIPIDEMIA (ICD-272.4) CAD, S/P CABG x2 and last cath, October 2006 with PCI to SVG with       non-flow drug-eluting platform, continue patency of LIMA to LAD,       slightly hazy/irregular disease involving circumflex artery, which       was not involved in bypass, minimal decreased LV function.  Stable AAA.  History of 77-pack-year smoking, history quitting in November 2009.  Paroxysmal atrial fibrillation Mobtitz II AV block  Past Surgical History: Last updated: 01/21/2009 s/p CABG  Cataract extraction  Family History: Last updated: 01/21/2009 sister has diabetes  Social History: Last updated: 01/16/2009  The patient lives in [place] with his girlfriend.   He is retired, although very active around the house.  He has a 77-pack-   year smoking history, quitting in November 2009.  He has a history of   alcohol abuse and continues to drink very moderately, no illicit drug   use.  No herbal medications.  He has a regular diet.  No regular   exercise, but very active.   Risk Factors: Smoking Status: quit (07/20/2009) Packs/Day: 1 (08/15/2007)  Vital Signs:  Patient profile:   77 year old male Height:      69 inches Weight:      169.50 pounds BMI:     25.12 Pulse rate:   65 / minute Pulse rhythm:   irregular Resp:     20 per minute BP sitting:  114 / 60  (left arm) Cuff size:   large  Vitals Entered By: Vikki Ports (Jan 28, 2010 11:05 AM) CC: Some Sob Comments INR 2.3 3 weeks ao    EKG  Procedure date:  01/28/2010  Findings:      NSR with First degree AV block.  LAD.  RBBB.  Septal infarct.  T wave inversion, seen on prior tracings.   PPM Specifications Following MD:  Hillis Range, MD     PPM Vendor:  [employer]     PPM Model Number:  [id number]     PPM Serial Number:  [id number] PPM DOI:  03/02/2009     PPM Implanting MD:  Hillis Range, MD  Lead 1    Location: [facility]     DOI: 03/02/2009     Model #: [id number]     Serial #: [id number]     Status: active Lead 2    Location: [facility]     DOI: 03/02/2009     Model #: [id number]     Serial #: [id number]     Status: active  Magnet Response Rate:  BOL 100 ERI 85  Indications:  Type II heart block   PPM Follow Up Pacer Dependent:  No      Episodes Coumadin:  Yes  Parameters Mode:  DDD     Lower Rate Limit:  60     Upper Rate Limit:  120 Paced AV Delay:  200     Sensed AV Delay:  200  Impression & Recommendations:  Problem # 1:  CORONARY ATHEROSCLEROSIS, ARTERY BYPASS GRAFT (ICD-414.04) No recurrent symptoms at present.  No longer smoking.  Continue medical therapy. His updated medication list for this problem includes:    Adult Aspirin Ec Low Strength 81 Mg Tbec (Aspirin) ..... Once daily    Isosorbide Mononitrate Cr 30 Mg Xr24h-tab (Isosorbide mononitrate) .Marland Kitchen... Take 1/2 by mouth once daily    Warfarin Sodium 5 Mg Tabs (Warfarin sodium) .Marland Kitchen... 7.5 mg. 4 days weekly 5 mg. 3xweekly  Problem # 2:  ATRIAL FIBRILLATION, PAROXYSMAL (ICD-427.31)  remains on warfarin.   Had hemoptysis, with abnormal CT, followed by Dr. Scotty Court in setting of pneumonia.   His updated medication list for this problem includes:    Adult Aspirin Ec Low Strength 81 Mg Tbec (Aspirin) ..... Once daily    Warfarin Sodium 5 Mg Tabs (Warfarin sodium) .Marland Kitchen... 7.5 mg. 4 days weekly 5 mg. 3xweekly  Orders: EKG w/ Interpretation (93000)  Problem # 3:  HYPERTENSION, BENIGN (ICD-401.1)  Controlled.  His updated medication list for this problem includes:    Adult Aspirin Ec Low Strength 81 Mg Tbec (Aspirin) ..... Once daily  Problem # 4:  HYPERLIPIDEMIA (ICD-272.4)  followed by Dr. Scotty Court.  Labs recently. His updated medication list for this problem includes:    Simvastatin 10 Mg Tabs (Simvastatin) .Marland Kitchen... Take one tablet by mouth daily at bedtime  Orders: EKG w/ Interpretation (93000)  Problem # 5:  AAA  (ICD-441.4) followed by [facility].  See in EMR.   Patient Instructions: 1)  Your physician recommends that you continue on your current medications as directed. Please refer to the Current Medication list given to you today. 2)  Your physician wants you to follow-up in:  6 MONTHS.  You will receive a reminder letter in the mail two months in advance. If you don't receive a letter, please call our office to schedule the follow-up appointment. 3)  You are due to see Dr Johney Frame in  October for device follow-up.   Appended Document: f1y reviewed

## 2010-10-05 NOTE — Progress Notes (Signed)
Summary: R arm and R-sided chest pain  Phone Note Call from Patient   Caller: Carrie Mew Call For: Judithann Sheen MD Summary of Call: C/o R arm and R-sided chest discomfort x 2 days, no other symptoms Initial call taken by: Raechel Ache, RN,  October 22, 2009 9:19 AM  Follow-up for Phone Call        appt today Dr Scotty Court; if you get worse, go to ER. Follow-up by: Raechel Ache, RN,  October 22, 2009 9:19 AM

## 2010-10-05 NOTE — Cardiovascular Report (Signed)
Summary: Office Visit   Office Visit   Imported By: Roderic Ovens 06/17/2010 15:41:27  _____________________________________________________________________  External Attachment:    Type:   Image     Comment:   External Document

## 2010-10-05 NOTE — Progress Notes (Signed)
Summary: Pt req samples of xopenex.  Phone Note Call from Patient Call back at Home Phone [phone]   Caller: [name] [name] -POA Summary of Call: Pt req samples of xopenex. Pls call when ready for pick up.  Initial call taken by: Lucy Antigua,  April 02, 2010 10:37 AM  Follow-up for Phone Call        will have to call in rx no samples avaialble  Follow-up by: Pura Spice, RN,  April 05, 2010 8:23 AM  Additional Follow-up for Phone Call Additional follow up Details #1::        Pt. notified. Additional Follow-up by: Lynann Beaver CMA,  April 05, 2010 8:33 AM     Appended Document: Pt req samples of xopenex.      Appended Document: Pt req samples of xopenex.

## 2010-10-05 NOTE — Progress Notes (Signed)
Summary: yes different med than celebrex  Phone Note Refill Request Call back at Home Phone [phone] Message from:  spouse---live call  Refills Requested: Medication #1:  TEMAZEPAM 30 MG CAPS Take 1 tab by mouth at bedtime.  Medication #2:  CELEBREX 200 MG CAPS 1 two times a day for arthritis   Brand Name Necessary? No please write rxs. wants generic for Celebrex. pt will need to pick up scripts and they will mail to mail order pharmacy, which they denied his Temazepam that was faxed. please call pt when done. thanks.  Yes, he wants to try something different & cheaper.  Rudy Jew, RN  March 23, 2010 9:29 AM   Initial call taken by: Warnell Forester,  March 19, 2010 4:02 PM  Follow-up for Phone Call        pls call and let pt know there is no generic for celebrex. does he want to try something different.  per pt wants something cheaper  Follow-up by: Pura Spice, RN,  March 23, 2010 8:09 AM  Additional Follow-up for Phone Call Additional follow up Details #1::        there is no generic and since on coumadin unable to take other  NSAIDS Additional Follow-up by: Judithann Sheen MD,  March 23, 2010 5:55 PM     Appended Document: yes different med than celebrex unable to take othe NSAIds
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
## 2010-10-05 NOTE — Progress Notes (Signed)
Summary: Ventolin  Phone Note Outgoing Call   Summary of Call: Prior approval approved for ventolin. Resending to pharmacy Initial call taken by: Josph Macho RMA,  May 14, 2010 9:00 AM    New/Updated Medications: VENTOLIN HFA 108 (90 BASE) MCG/ACT AERS (ALBUTEROL SULFATE) 2 puffs by mouth three times a day as needed for copd/asthma Prescriptions: VENTOLIN HFA 108 (90 BASE) MCG/ACT AERS (ALBUTEROL SULFATE) 2 puffs by mouth three times a day as needed for copd/asthma  #1 x 2   Entered by:   Josph Macho RMA   Authorized by:   Danise Edge MD   Signed by:   Josph Macho RMA on 05/14/2010   Method used:   Electronically to        [facility]  [address] [id number]* (retail)       [address]       [place], [place]  [address]       Ph: [phone] or [phone]       Fax: [phone]   RxID:   [id number]

## 2010-10-05 NOTE — Medication Information (Signed)
Summary: rov/tm  Anticoagulant Therapy  Managed by: Bethena Midget, RN, BSN Referring MD: Johney Frame MD, Fayrene Fearing PCP: Judithann Sheen MD Supervising MD: Gala Romney MD, Reuel Boom Indication 1: Atrial Fibrillation Lab Used: [facility] [place] Site: [address] INR POC 2.3 INR RANGE 2.0-3.0  Dietary changes: no    Health status changes: no    Bleeding/hemorrhagic complications: no    Recent/future hospitalizations: no    Any changes in medication regimen? no    Recent/future dental: no  Any missed doses?: no       Is patient compliant with meds? yes       Allergies: 1)  ! Pcn 2)  Amoxicillin (Amoxicillin)  Anticoagulation Management History:      The patient is taking warfarin and comes in today for a routine follow up visit.  Positive risk factors for bleeding include an age of 75 years or older.  The bleeding index is 'intermediate risk'.  Positive CHADS2 values include History of HTN and Age > 58 years old.  His last INR was 1.1 ratio.  Anticoagulation responsible provider: Bensimhon MD, Reuel Boom.  INR POC: 2.3.  Cuvette Lot#: [id number].  Exp: 04/2011.    Anticoagulation Management Assessment/Plan:      The patient's current anticoagulation dose is Warfarin sodium 5 mg tabs: 7.5 mg. 4 days weekly 5 mg. 3xweekly.  The target INR is 2.0-3.0.  The next INR is due 03/17/2010.  Anticoagulation instructions were given to patient.  Results were reviewed/authorized by Bethena Midget, RN, BSN.  He was notified by Bethena Midget, RN, BSN.         Prior Anticoagulation Instructions: INR 2.3 Continue 5mg  daily except 7.5mg  on Mondays, Wednesdays and Fridays. Recheck in 4 weeks.   Current Anticoagulation Instructions: INR 2.3 Continue 5mg s daily except 7.5mg s on Mondays, Wednesdays and Fridays. Recheck in 4 weeks.

## 2010-10-05 NOTE — Medication Information (Signed)
Summary: rov/tm  Anticoagulant Therapy  Managed by: Cloyde Reams, RN, BSN Referring MD: Johney Frame MD, Fayrene Fearing PCP: Judithann Sheen MD Supervising MD: Gala Romney MD, Reuel Boom Indication 1: Atrial Fibrillation Lab Used: [facility] [place] Site: [address] INR POC 3.7 INR RANGE 2.0-3.0  Dietary changes: no    Health status changes: no    Bleeding/hemorrhagic complications: no    Recent/future hospitalizations: no    Any changes in medication regimen? no    Recent/future dental: no  Any missed doses?: no       Is patient compliant with meds? yes       Allergies (verified): 1)  ! Pcn 2)  Amoxicillin (Amoxicillin)  Anticoagulation Management History:      The patient is taking warfarin and comes in today for a routine follow up visit.  Positive risk factors for bleeding include an age of 75 years or older.  The bleeding index is 'intermediate risk'.  Positive CHADS2 values include History of HTN and Age > 2 years old.  His last INR was 1.1 ratio.  Anticoagulation responsible provider: Conrad Zajkowski MD, Reuel Boom.  INR POC: 3.7.  Cuvette Lot#: [id number].  Exp: 12/2010.    Anticoagulation Management Assessment/Plan:      The patient's current anticoagulation dose is Warfarin sodium 5 mg tabs: 7.5 mg. 4 days weekly 5 mg. 3xweekly.  The target INR is 2.0-3.0.  The next INR is due 10/21/2009.  Anticoagulation instructions were given to patient.  Results were reviewed/authorized by Cloyde Reams, RN, BSN.  He was notified by Cloyde Reams RN.         Prior Anticoagulation Instructions: INR 2.3 Continue 7.5mg s daily exept 5mg s on Tuesdays, Thursdays, and Sundays. Recheck in 4 weeks.   Current Anticoagulation Instructions: INR 3.7  Skip today's dosage of coumadin then start taking 1 tablet daily except 1.5 tablets on MOndays, Wednesdays, and Fridays.   Recheck in 2 weeks.

## 2010-10-05 NOTE — Letter (Signed)
Summary: [facility]  [facility]   Imported By: Maryln Gottron 06/10/2010 14:43:56  _____________________________________________________________________  External Attachment:    Type:   Image     Comment:   External Document
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
## 2010-10-05 NOTE — Progress Notes (Signed)
Summary: viagra  Phone Note Call from Patient Call back at Home Phone [phone]   Caller: vm Call For: stafford Reason for Call: Talk to Doctor Summary of Call: Ask his nurse to have Dr. Satira Sark call me.  Called. Raelene Bott Spell, RN  October 06, 2009 1:11 PM  Wants to change his prescription from Cialis to Viagra 50mg  #32  or 100mg  #50 one day prn.  Viagra samples worked.  Fax to [phone] [facility] or we'll pick up prescription and send off.   Supposed to take the Cialis  every day & he's not & it's not working.   Initial call taken by: Rudy Jew, RN,  October 06, 2009 1:11 PM  Follow-up for Phone Call        sent rx

## 2010-10-05 NOTE — Medication Information (Signed)
Summary: rov/ewj  Anticoagulant Therapy  Managed by: Bethena Midget, RN, BSN Referring MD: Johney Frame MD, Fayrene Fearing PCP: Judithann Sheen MD Supervising MD: Shirlee Latch MD, Evaleen Sant Indication 1: Atrial Fibrillation Lab Used: [facility] [place] Site: [address] INR POC 2.3 INR RANGE 2.0-3.0  Dietary changes: no    Health status changes: no    Bleeding/hemorrhagic complications: no    Recent/future hospitalizations: no    Any changes in medication regimen? no    Recent/future dental: no  Any missed doses?: no       Is patient compliant with meds? yes       Allergies: 1)  ! Pcn 2)  Amoxicillin (Amoxicillin)  Anticoagulation Management History:      The patient is taking warfarin and comes in today for a routine follow up visit.  Positive risk factors for bleeding include an age of 74 years or older.  The bleeding index is 'intermediate risk'.  Positive CHADS2 values include History of HTN and Age > 30 years old.  His last INR was 1.1 ratio.  Anticoagulation responsible provider: Shirlee Latch MD, Jareth Pardee.  INR POC: 2.3.  Cuvette Lot#: [id number].  Exp: 02/2011.    Anticoagulation Management Assessment/Plan:      The patient's current anticoagulation dose is Warfarin sodium 5 mg tabs: 7.5 mg. 4 days weekly 5 mg. 3xweekly.  The target INR is 2.0-3.0.  The next INR is due 01/20/2010.  Anticoagulation instructions were given to patient.  Results were reviewed/authorized by Bethena Midget, RN, BSN.  He was notified by Bethena Midget, RN, BSN.         Prior Anticoagulation Instructions: INR 2.2  Continue on same dosage 1 tablet daily except 1.5 tablets on Mondays, Wednesdays, and Fridays.  Recheck in 3 weeks.    Current Anticoagulation Instructions: INR 2.3 Continue 5mg s daily except 7.5mg s on Mondays, Wednesdays and Fridays. Recheck in 4 weeks.

## 2010-10-05 NOTE — Progress Notes (Signed)
Summary: Refill mail order  Phone Note Refill Request Message from:  Patient on February 24, 2010 10:55 AM  Refills Requested: Medication #1:  WARFARIN SODIUM 5 MG TABS 7.5 mg. 4 days weekly 5 mg. 3xweekly  Medication #2:  ISOSORBIDE MONONITRATE CR 30 MG XR24H-TAB Take 1/2 by mouth once daily Mail order Prescription [facility] [phone] need 90 supply with refills  Initial call taken by: Judie Grieve,  February 24, 2010 10:56 AM    Prescriptions: ISOSORBIDE MONONITRATE CR 30 MG XR24H-TAB (ISOSORBIDE MONONITRATE) Take 1/2 by mouth once daily  #45 x 3   Entered by:   Hardin Negus, RMA   Authorized by:   Ronaldo Miyamoto, MD, [facility]   Signed by:   Hardin Negus, RMA on 02/24/2010   Method used:   Electronically to        [facility]* (mail-order)       [address]       [place], [place]  [address]       Ph: [phone]       Fax: [phone]   RxID:   [id number]
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
## 2010-10-05 NOTE — Medication Information (Signed)
Summary: rov/jj  Anticoagulant Therapy  Managed by: Eda Keys, PharmD Referring MD: Johney Frame MD, Fayrene Fearing PCP: Judithann Sheen MD Supervising MD: Clifton James MD, Cristal Deer Indication 1: Atrial Fibrillation Lab Used: [facility] [place] Site: [address] INR POC 2.2 INR RANGE 2.0-3.0  Dietary changes: no    Health status changes: no    Bleeding/hemorrhagic complications: no    Recent/future hospitalizations: no    Any changes in medication regimen? no    Recent/future dental: no  Any missed doses?: no       Is patient compliant with meds? yes       Allergies: 1)  ! Pcn 2)  Amoxicillin (Amoxicillin)  Anticoagulation Management History:      The patient is taking warfarin and comes in today for a routine follow up visit.  Positive risk factors for bleeding include an age of 75 years or older.  The bleeding index is 'intermediate risk'.  Positive CHADS2 values include History of HTN and Age > 91 years old.  His last INR was 1.1 ratio.  Anticoagulation responsible provider: Clifton James MD, Cristal Deer.  INR POC: 2.2.  Cuvette Lot#: [id number].  Exp: 07/2011.    Anticoagulation Management Assessment/Plan:      The patient's current anticoagulation dose is Warfarin sodium 5 mg tabs: Use as directed by Anticoagulation Clinic.  The target INR is 2.0-3.0.  The next INR is due 06/08/2010.  Anticoagulation instructions were given to patient.  Results were reviewed/authorized by Eda Keys, PharmD.         Prior Anticoagulation Instructions: INR 1.9  Take 2 tablets tonight (10mg ).  Then resume normal schedule of 1.5 tablets (7.5mg ) every day except take 1 tablet (5mg ) on Mondays, Wednesdays, and Fridays.    Current Anticoagulation Instructions: INR 2.2  Continue taking 1 tablet everday except take 1 1/2 tablets on Mon, Wed, and Fri. Re-check INR in 4 weeks.

## 2010-10-07 NOTE — Progress Notes (Signed)
Summary: refills  Phone Note Call from Patient   Caller: Patient Call For: Judithann Sheen MD Summary of Call: Please call pt for past due refills.  Has been attempting to get refills x 3 weeks?  Temazepam  [facility]) [phone] Initial call taken by: [facility] CMA AAMA,  August 10, 2010 11:18 AM  Follow-up for Phone Call        will refill Follow-up by: Judithann Sheen MD,  August 17, 2010 12:52 PM    New/Updated Medications: TEMAZEPAM 30 MG CAPS (TEMAZEPAM) 1 hs for sleep Prescriptions: TEMAZEPAM 30 MG CAPS (TEMAZEPAM) Take 1 tab by mouth at bedtime  #30 x 0   Entered by:   Lynann Beaver CMA AAMA   Authorized by:   Judithann Sheen MD   Signed by:   Lynann Beaver CMA AAMA on 08/18/2010   Method used:   Telephoned to ...       [facility]  [address] # [id number]* (retail)       [address]       [place], [place]  [address]       Ph: [phone] or [phone]       Fax: [phone]   RxID:   [id number] TEMAZEPAM 30 MG CAPS (TEMAZEPAM) 1 hs for sleep  #30 x 0   Entered and Authorized by:   Judithann Sheen MD   Signed by:   Judithann Sheen MD on 08/17/2010   Method used:   Print then Give to Patient   RxID:   [id number]

## 2010-10-07 NOTE — Progress Notes (Signed)
Summary: [facility] rx [facility]  Phone Note Refill Request Message from:  other-wife  Refills Requested: Medication #1:  TEMAZEPAM 30 MG CAPS Take 1 tab by mouth at bedtime pt call [facility] new rx  Initial call taken by: Heron Sabins,  August 13, 2010 12:54 PM  Follow-up for Phone Call        Pt called to check on status of Temezepam. Pls call in asap. Pt out of med.  Follow-up by: Lucy Antigua,  August 17, 2010 10:22 AM  Additional Follow-up for Phone Call Additional follow up Details #1::        Pt notified that script has been called in.  Additional Follow-up by: Lucy Antigua,  August 17, 2010 11:22 AM    Prescriptions: TEMAZEPAM 30 MG CAPS (TEMAZEPAM) Take 1 tab by mouth at bedtime  #90 x 1   Entered by:   Kern Reap CMA (AAMA)   Authorized by:   Judithann Sheen MD   Signed by:   Kern Reap CMA (AAMA) on 08/17/2010   Method used:   Telephoned to ...       [facility]  [address] # [id number]* (retail)       [address]       [place], [place]  [address]       Ph: [phone] or [phone]       Fax: [phone]   RxID:   [id number]

## 2010-10-07 NOTE — Assessment & Plan Note (Signed)
Summary: [name] [name]   Visit Type:  Follow-up Referring Provider:  Bonnee Quin, MD Primary Provider:  Judithann Sheen MD  CC:  No complaints.  History of Present Illness: Patient complaining of cough productive of yellowish green sputum.  Wife thinks he needs an antibiotic.  Otherwise stable.  Tired of coming to doctors.  Does not want to be seen as often.  Not having specific isues at this point cardiac wise.    Problems Prior to Update: 1)  Uri  (ICD-465.9) 2)  Constipation  (ICD-564.00) 3)  Closed Dislocation of Acromioclavicular  (ICD-831.04) 4)  Acute Bronchitis  (ICD-466.0) 5)  Aaa  (ICD-441.4) 6)  Sinusitis - Acute-nos  (ICD-461.9) 7)  Special Screening Malignant Neoplasm of Prostate  (ICD-V76.44) 8)  Anemia  (ICD-285.9) 9)  Costochondritis  (ICD-733.6) 10)  Cholesterolosis of Gallbladder  (ICD-575.6) 11)  Cholelithiasis  (ICD-574.20) 12)  Mobitz II Atrioventricular Block  (ICD-426.12) 13)  Atrial Fibrillation, Paroxysmal  (ICD-427.31) 14)  Cad  (ICD-414.00) 15)  Encounter For Long-term Use of Other Medications  (ICD-V58.69) 16)  Hypertension, Benign  (ICD-401.1) 17)  Back Pain, Chronic  (ICD-724.5) 18)  Edema  (ICD-782.3) 19)  Vitamin D Deficiency  (ICD-268.9) 20)  Special Screening Malignant Neoplasm of Prostate  (ICD-V76.44) 21)  Hypothyroidism  (ICD-244.9) 22)  Uns Advrs Eff Oth Rx Medicinal&biological Sbstnc  (ZOX-096.04) 23)  Erectile Dysfunction  (ICD-302.72) 24)  Arthritis, Hip  (ICD-716.95) 25)  Insomnia  (ICD-780.52) 26)  Restless Leg Syndrome  (ICD-333.94) 27)  Coronary Atherosclerosis, Artery Bypass Graft  (ICD-414.04) 28)  Benign Prostatic Hypertrophy, Hx of  (ICD-V13.8) 29)  Gerd  (ICD-530.81) 30)  Frequency, Urinary  (ICD-788.41) 31)  COPD  (ICD-496) 32)  Hyperlipidemia  (ICD-272.4)  Current Medications (verified): 1)  Adult Aspirin Ec Low Strength 81 Mg  Tbec (Aspirin) .... Once Daily 2)  Isosorbide Mononitrate Cr 30 Mg Xr24h-Tab (Isosorbide  Mononitrate) .... Take 1/2 By Mouth Once Daily 3)  Requip 4 Mg  Tabs (Ropinirole Hcl) .Marland Kitchen.. 1 Tab 1 Hr Before Bedtime and 1/2 Tab Each Morning 4)  Prilosec 40 Mg  Cpdr (Omeprazole) .... Take 1  Am and Pm 5)  Allegra 180 Mg Tabs (Fexofenadine Hcl) .... One Tab Daily 6)  Simvastatin 10 Mg Tabs (Simvastatin) .... Take One Tablet By Mouth Daily At Bedtime 7)  Warfarin Sodium 5 Mg Tabs (Warfarin Sodium) .... Use As Directed By Anticoagulation Clinic Present Sig: 1 Tab By Mouth Qpm Except M, W, F To Take 1 1/2 Tabs By Mouth 8)  Epipen 2-Pak 0.3 Mg/0.24ml (1:1000) Devi (Epinephrine Hcl (Anaphylaxis)) .... Use As Directed 9)  Viagra 100 Mg Tabs (Sildenafil Citrate) .Marland Kitchen.. 1  As Instructed 10)  Temazepam 30 Mg Caps (Temazepam) .... Take 1 Tab By Mouth At Bedtime 11)  Ventolin Hfa 108 (90 Base) Mcg/act Aers (Albuterol Sulfate) .... 2 Puffs By Mouth Three Times A Day As Needed For Copd/asthma 12)  Beta Glucan 500mg  .... 1 Daily 13)  Chlorella 335mg  .... 2 Daily 14)  Mucinex 600 Mg Xr12h-Tab (Guaifenesin) .... Bid 15)  Multivitamins  Tabs (Multiple Vitamin) 16)  Coricidin Hbp Cough/cold 4-30 Mg Tabs (Chlorpheniramine-Dm) .... Take 1 Tablet By Mouth Two Times A Day  Allergies: 1)  ! Pcn 2)  Amoxicillin (Amoxicillin)  Vital Signs:  Patient profile:   77 year old male Height:      69 inches Weight:      162.75 pounds BMI:     24.12 Pulse rate:   77 / minute Pulse  rhythm:   irregular Resp:     18 per minute BP sitting:   134 / 70  (left arm) Cuff size:   large  Vitals Entered By: Vikki Ports (August 26, 2010 2:30 PM)  Physical Exam  General:  Well developed, well nourished, in no acute distress.  Chronically ill appearing Head:  normocephalic and atraumatic Eyes:  PERRLA/EOM intact; conjunctiva and lids normal. Lungs:  Prolonged expiration.  No definite rales.   Heart:  PMI non displaced.  Normal S1 and S2.  Soft apical murmur Pulses:  pulses normal in all 4 extremities Extremities:  No  clubbing or cyanosis. Neurologic:  Alert and oriented x 3.   EKG  Procedure date:  08/26/2010  Findings:      SR with first degree. RBBB. LAFB.  Anterior T inversion, but improved from prior tracing.   PPM Specifications Following MD:  Hillis Range, MD     PPM Vendor:  [employer]     PPM Model Number:  [id number]     PPM Serial Number:  [id number] DOI:  03/02/2009     PPM Implanting MD:  Hillis Range, MD  Lead 1    Location: [facility]     DOI: 03/02/2009     Model #: [id number]     Serial #: [id number]     Status: active Lead 2    Location: [facility]     DOI: 03/02/2009     Model #: [id number]     Serial #: [id number]     Status: active  Magnet Response Rate:  BOL 100 ERI 85  Indications:  Type II heart block   PPM Follow Up Pacer Dependent:  No      Episodes Coumadin:  Yes  Parameters Mode:  DDD     Lower Rate Limit:  60     Upper Rate Limit:  120 Paced AV Delay:  200     Sensed AV Delay:  200  Impression & Recommendations:  Problem # 1:  ACUTE BRONCHITIS (ICD-466.0) greenish yellowish sputum with marginal pulmonary status---add course of zithromax.  To call if not improved.   His updated medication list for this problem includes:    Ventolin Hfa 108 (90 Base) Mcg/act Aers (Albuterol sulfate) .Marland Kitchen... 2 puffs by mouth three times a day as needed for copd/asthma    Azithromycin 250 Mg Tabs (Azithromycin) .Marland Kitchen... Take two pills on day 1 and then take one pill daily for 4 days.  Problem # 2:  ATRIAL FIBRILLATION, PAROXYSMAL (ICD-427.31) On coumadin.  Continue at this point.  Options with other meds discussed.  His updated medication list for this problem includes:    Adult Aspirin Ec Low Strength 81 Mg Tbec (Aspirin) ..... Once daily    Warfarin Sodium 5 Mg Tabs (Warfarin sodium) ..... Use as directed by anticoagulation clinic present sig: 1 tab by mouth qpm except m, w, f to take 1 1/2 tabs by mouth  Problem # 3:  CORONARY ATHEROSCLEROSIS, ARTERY BYPASS GRAFT (ICD-414.04) stable.  No current symptoms.    His updated medication list for this problem includes:    Adult Aspirin Ec Low Strength 81 Mg Tbec (Aspirin) ..... Once daily    Isosorbide Mononitrate Cr 30 Mg Xr24h-tab (Isosorbide mononitrate) .Marland Kitchen... Take 1/2 by mouth once daily    Warfarin Sodium 5 Mg Tabs (Warfarin sodium) ..... Use as directed by anticoagulation clinic present sig: 1 tab by mouth qpm except m, w, f to take 1 1/2 tabs by mouth  Problem #  4:  HYPERLIPIDEMIA (ICD-272.4) on simva at present time.  His updated medication list for this problem includes:    Simvastatin 10 Mg Tabs (Simvastatin) .Marland Kitchen... Take one tablet by mouth daily at bedtime  Patient Instructions: 1)  Your physician recommends that you schedule a follow-up appointment in: 6 months. 2)  Your physician recommends that you continue on your current medications as directed. Please refer to the Current Medication list given to you today. 3)  Please take your Azithromycin as prescribed.  Prescriptions: AZITHROMYCIN 250 MG TABS (AZITHROMYCIN) Take two pills on day 1 and then take one pill daily for 4 days.  #10 x 0   Entered by:   Whitney Maeola Sarah RN   Authorized by:   Ronaldo Miyamoto, MD, [facility]   Signed by:   Ellender Hose RN on 08/26/2010   Method used:   Electronically to        [facility] # [id number]* (retail)       [address]       [place], [place]  [address]       Ph: [phone] or [phone]       Fax: [phone]   RxID:   [id number]   Appended Document: f59m reviewed

## 2010-10-07 NOTE — Medication Information (Signed)
Summary: [name] [name]  Anticoagulant Therapy  Managed by: Bethena Midget, RN, BSN Referring MD: Johney Frame MD, Fayrene Fearing PCP: Judithann Sheen MD Supervising MD: Myrtis Ser MD, Tinnie Gens Indication 1: Atrial Fibrillation Lab Used: [facility] [place] Site: [address] INR POC 1.8 INR RANGE 2.0-3.0  Dietary changes: no    Health status changes: no    Bleeding/hemorrhagic complications: no    Recent/future hospitalizations: no    Any changes in medication regimen? no    Recent/future dental: no  Any missed doses?: no       Is patient compliant with meds? yes       Allergies: 1)  ! Pcn 2)  Amoxicillin (Amoxicillin)  Anticoagulation Management History:      The patient is taking warfarin and comes in today for a routine follow up visit.  Positive risk factors for bleeding include an age of 75 years or older.  The bleeding index is 'intermediate risk'.  Positive CHADS2 values include History of HTN and Age > 71 years old.  His last INR was 1.1 ratio.  Anticoagulation responsible provider: Myrtis Ser MD, Tinnie Gens.  INR POC: 1.8.  Cuvette Lot#: [id number].  Exp: 10/2011.    Anticoagulation Management Assessment/Plan:      The patient's current anticoagulation dose is Warfarin sodium 5 mg tabs: Use as directed by Anticoagulation Clinic Present sig: 1 tab by mouth qpm except M, W, F to take 1 1/2 tabs by mouth.  The target INR is 2.0-3.0.  The next INR is due 09/23/2010.  Anticoagulation instructions were given to patient.  Results were reviewed/authorized by Bethena Midget, RN, BSN.  He was notified by Bethena Midget, RN, BSN.         Prior Anticoagulation Instructions: INR 1.8 Today take 1.5 pills then resume 1 pill everyday except 1.5 pills on Mondays, Wednesdays and Fridays. Recheck in 3 weeks.   Current Anticoagulation Instructions: INR 1.8 Today take 1.5 pills then change dose to 1.5 pills everyday except 1 pill on  Tuesdays, Thursdays and Saturdays. Recheck in  2 weeks.

## 2010-10-07 NOTE — Medication Information (Signed)
Summary: rov/tm  Anticoagulant Therapy  Managed by: Weston Brass, PharmD Referring MD: Johney Frame MD, Fayrene Fearing PCP: Judithann Sheen MD Supervising MD: Ladona Ridgel MD, Sharlot Gowda Indication 1: Atrial Fibrillation Lab Used: [facility] [place] Site: [address] INR POC 2.2 INR RANGE 2.0-3.0  Dietary changes: no    Health status changes: no    Bleeding/hemorrhagic complications: no    Recent/future hospitalizations: no    Any changes in medication regimen? no    Recent/future dental: no  Any missed doses?: no       Is patient compliant with meds? yes       Allergies: 1)  ! Pcn 2)  Amoxicillin (Amoxicillin)  Anticoagulation Management History:      The patient is taking warfarin and comes in today for a routine follow up visit.  Positive risk factors for bleeding include an age of 75 years or older.  The bleeding index is 'intermediate risk'.  Positive CHADS2 values include History of HTN and Age > 33 years old.  His last INR was 1.1 ratio.  Anticoagulation responsible provider: Ladona Ridgel MD, Sharlot Gowda.  INR POC: 2.2.  Cuvette Lot#: [id number].  Exp: 09/2011.    Anticoagulation Management Assessment/Plan:      The patient's current anticoagulation dose is Warfarin sodium 5 mg tabs: Use as directed by Anticoagulation Clinic Present sig: 1 tab by mouth qpm except M, W, F to take 1 1/2 tabs by mouth.  The target INR is 2.0-3.0.  The next INR is due 10/21/2010.  Anticoagulation instructions were given to patient.  Results were reviewed/authorized by Weston Brass, PharmD.  He was notified by Weston Brass PharmD.         Prior Anticoagulation Instructions: INR 1.8 Today take 1.5 pills then change dose to 1.5 pills everyday except 1 pill on  Tuesdays, Thursdays and Saturdays. Recheck in  2 weeks.   Current Anticoagulation Instructions: INR 2.2  Continue same dose of 1 1/2 tablets every day except 1 tablet on Tuesday, Thursday and Saturday.  Recheck INR in 4 weeks.

## 2010-10-07 NOTE — Assessment & Plan Note (Signed)
Summary: URI? // RS   Vital Signs:  Patient profile:   77 year old male Weight:      166 pounds Temp:     97.6 degrees F oral BP sitting:   100 / 64  (right arm) Cuff size:   regular  Vitals Entered By: Duard Brady LPN (August 24, 2010 3:50 PM) CC: head and chest congestion , productive cough, check fingers on (L) hand ??infection Is Patient Diabetic? No   Primary Care Provider:  Judithann Sheen MD  CC:  head and chest congestion , productive cough, and check fingers on (L) hand ??infection.  History of Present Illness: 77 -year-old patient, who presents with a one-week history of productive cough that is worse in the morning.  He denies any fever, shortness of breath, or pleuritic chest pain.  Earlier today.  He started taking Metamucil.  Next and feels improved.  He has treated hypertension and dyslipidemia, which have been stable  Preventive Screening-Counseling & Management  Alcohol-Tobacco     Smoking Status: quit  Allergies: 1)  ! Pcn 2)  Amoxicillin (Amoxicillin)  Past History:  Past Medical History: Reviewed history from 06/11/2010 and no changes required. CAD (ICD-414.00) HYPERTENSION, BENIGN (ICD-401.1) BACK PAIN, CHRONIC (ICD-724.5) VITAMIN D DEFICIENCY (ICD-268.9) SPECIAL SCREENING MALIGNANT NEOPLASM OF PROSTATE (ICD-V76.44) HYPOTHYROIDISM (ICD-244.9) ANEMIA (ICD-285.9) ERECTILE DYSFUNCTION (ICD-302.72) ARTHRITIS, HIP (ICD-716.95) RESTLESS LEG SYNDROME (ICD-333.94) CORONARY ATHEROSCLEROSIS, ARTERY BYPASS GRAFT (ICD-414.04) BENIGN PROSTATIC HYPERTROPHY, HX OF (ICD-V13.8) GERD (ICD-530.81) COPD (ICD-496) HYPERLIPIDEMIA (ICD-272.4) CAD, S/P CABG x2 and last cath, October 2006 with PCI to SVG with       non-flow drug-eluting platform, continue patency of LIMA to LAD,       slightly hazy/irregular disease involving circumflex artery, which       was not involved in bypass, minimal decreased LV function.  Stable AAA.  History of 77-pack-year  smoking, history quitting in November 2009.  Paroxysmal atrial fibrillation Mobtitz II AV block  Review of Systems       The patient complains of prolonged cough.  The patient denies anorexia, fever, weight loss, weight gain, vision loss, decreased hearing, hoarseness, chest pain, syncope, dyspnea on exertion, peripheral edema, headaches, hemoptysis, abdominal pain, melena, hematochezia, severe indigestion/heartburn, hematuria, incontinence, genital sores, muscle weakness, suspicious skin lesions, transient blindness, difficulty walking, depression, unusual weight change, abnormal bleeding, enlarged lymph nodes, angioedema, breast masses, and testicular masses.    Physical Exam  General:  Well-developed,well-nourished,in no acute distress; alert,appropriate and cooperative throughout examination Head:  Normocephalic and atraumatic without obvious abnormalities. No apparent alopecia or balding. Eyes:  No corneal or conjunctival inflammation noted. EOMI. Perrla. Funduscopic exam benign, without hemorrhages, exudates or papilledema. Vision grossly normal. Mouth:  Oral mucosa and oropharynx without lesions or exudates.  Teeth in good repair. Neck:  No deformities, masses, or tenderness noted. Lungs:  Normal respiratory effort, chest expands symmetrically. Lungs are clear to auscultation, no crackles or wheezes.  O2 sat 97 Heart:  Normal rate and regular rhythm. S1 and S2 normal without gallop, murmur, click, rub or other extra sounds. Abdomen:  Bowel sounds positive,abdomen soft and non-tender without masses, organomegaly or hernias noted.  prominent aortic  pulsation  Msk:  No deformity or scoliosis noted of thoracic or lumbar spine.   Pulses:  pedal pulses intact   Impression & Recommendations:  Problem # 1:  URI (ICD-465.9)  His updated medication list for this problem includes:    Adult Aspirin Ec Low Strength 81 Mg Tbec (Aspirin) ..... Once  daily    Allegra 180 Mg Tabs (Fexofenadine  hcl) ..... One tab daily    Mucinex 600 Mg Xr12h-tab (Guaifenesin) ..... Bid  His updated medication list for this problem includes:    Adult Aspirin Ec Low Strength 81 Mg Tbec (Aspirin) ..... Once daily    Allegra 180 Mg Tabs (Fexofenadine hcl) ..... One tab daily    Mucinex 600 Mg Xr12h-tab (Guaifenesin) ..... Bid  Problem # 2:  HYPERTENSION, BENIGN (ICD-401.1)  Complete Medication List: 1)  Adult Aspirin Ec Low Strength 81 Mg Tbec (Aspirin) .... Once daily 2)  Isosorbide Mononitrate Cr 30 Mg Xr24h-tab (Isosorbide mononitrate) .... Take 1/2 by mouth once daily 3)  Flomax 0.4 Mg Cp24 (Tamsulosin hcl) .Marland Kitchen.. 1 two times a day 4)  Requip 4 Mg Tabs (Ropinirole hcl) .Marland Kitchen.. 1 tab 1 hr before bedtime and 1/2 tab each morning 5)  Prilosec 40 Mg Cpdr (Omeprazole) .... Take 1  am and pm 6)  Allegra 180 Mg Tabs (Fexofenadine hcl) .... One tab daily 7)  Simvastatin 10 Mg Tabs (Simvastatin) .... Take one tablet by mouth daily at bedtime 8)  Warfarin Sodium 5 Mg Tabs (Warfarin sodium) .... Use as directed by anticoagulation clinic present sig: 1 tab by mouth qpm except m, w, f to take 1 1/2 tabs by mouth 9)  Epipen 2-pak 0.3 Mg/0.24ml (1:1000) Devi (Epinephrine hcl (anaphylaxis)) .... Use as directed 10)  Viagra 100 Mg Tabs (Sildenafil citrate) .Marland Kitchen.. 1  as instructed 11)  Temazepam 30 Mg Caps (Temazepam) .... Take 1 tab by mouth at bedtime 12)  Ventolin Hfa 108 (90 Base) Mcg/act Aers (Albuterol sulfate) .... 2 puffs by mouth three times a day as needed for copd/asthma 13)  Hydrocodone-acetaminophen 5-325 Mg Tabs (Hydrocodone-acetaminophen) .Marland Kitchen.. 1 tab by mouth q6 hrs as needed for pain 14)  Beta Glucan 500mg   .... 1 daily 15)  Chlorella 335mg   .... 2 daily 16)  Mucinex 600 Mg Xr12h-tab (Guaifenesin) .... Bid 17)  Multivitamins Tabs (Multiple vitamin)  Patient Instructions: 1)  Get plenty of rest, drink lots of clear liquids, and use Tylenol or Ibuprofen for fever and comfort. Return in 7-10 days if you're  not better:sooner if you're feeling worse.   Orders Added: 1)  Est. Patient Level III [14782]
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
## 2010-10-07 NOTE — Assessment & Plan Note (Signed)
Summary: 1-2 MONTH F/U//ALP   Vital Signs:  Patient profile:   77 year old male Weight:      165 pounds O2 Sat:      96 % Temp:     98 degrees F Pulse rate:   75 / minute Pulse rhythm:   regular BP sitting:   120 / 70  Vitals Entered By: Pura Spice, RN (September 07, 2010 1:23 PM) CC: 1 month follow up  no problems     History of Present Illness: this 77 year old white retired male he is widowed but has a girlfriend. His been a chronic smoker but did quit up. However he has COPD and is under therapy He relates he is doing her well he had bursitis of the shoulder which is greatly improved Cardiac symptoms are under control. He continues to have chronic back pain  and needs analgesics.He is in today to get all his prescriptions refill since he is changing insurance Hypertension controlled blood pressure 120/70 he does have a rash which he desires to be treated Urinary symptoms under control of that as well as his erectile dysfunction with his medication in September this year he had a dislocation of the a.c. joint on the left but is doing well at this time As of her chart her infection and bronchitis have cleared continue medication for his leg syndrome and is yesterday is much better as long she takes her medicine  Allergies: 1)  ! Pcn 2)  Amoxicillin (Amoxicillin)  Past History:  Past Medical History: Last updated: 06/11/2010 CAD (ICD-414.00) HYPERTENSION, BENIGN (ICD-401.1) BACK PAIN, CHRONIC (ICD-724.5) VITAMIN D DEFICIENCY (ICD-268.9) SPECIAL SCREENING MALIGNANT NEOPLASM OF PROSTATE (ICD-V76.44) HYPOTHYROIDISM (ICD-244.9) ANEMIA (ICD-285.9) ERECTILE DYSFUNCTION (ICD-302.72) ARTHRITIS, HIP (ICD-716.95) RESTLESS LEG SYNDROME (ICD-333.94) CORONARY ATHEROSCLEROSIS, ARTERY BYPASS GRAFT (ICD-414.04) BENIGN PROSTATIC HYPERTROPHY, HX OF (ICD-V13.8) GERD (ICD-530.81) COPD (ICD-496) HYPERLIPIDEMIA (ICD-272.4) CAD, S/P CABG x2 and last cath, October 2006 with PCI to SVG  with       non-flow drug-eluting platform, continue patency of LIMA to LAD,       slightly hazy/irregular disease involving circumflex artery, which       was not involved in bypass, minimal decreased LV function.  Stable AAA.  History of 77-pack-year smoking, history quitting in November 2009.  Paroxysmal atrial fibrillation Mobtitz II AV block  Past Surgical History: Last updated: 01/21/2009 s/p CABG  Cataract extraction  Social History: Last updated: 01/16/2009  The patient lives in [place] with his girlfriend.   He is retired, although very active around the house.  He has a 77-pack-   year smoking history, quitting in November 2009.  He has a history of   alcohol abuse and continues to drink very moderately, no illicit drug   use.  No herbal medications.  He has a regular diet.  No regular   exercise, but very active.   Risk Factors: Smoking Status: quit (08/24/2010) Packs/Day: 1 (08/15/2007)  Review of Systems      See HPI  Physical Exam  General:  Well-developed,well-nourished,in no acute distress; alert,appropriate and cooperative throughout examination Head:  Normocephalic and atraumatic without obvious abnormalities. No apparent alopecia or balding. Eyes:  No corneal or conjunctival inflammation noted. EOMI. Perrla. Funduscopic exam benign, without hemorrhages, exudates or papilledema. Vision grossly normal. Ears:  External ear exam shows no significant lesions or deformities.  Otoscopic examination reveals clear canals, tympanic membranes are intact bilaterally without bulging, retraction, inflammation or discharge. Hearing is grossly normal bilaterally. Nose:  External nasal  examination shows no deformity or inflammation. Nasal mucosa are pink and moist without lesions or exudates. Mouth:  Oral mucosa and oropharynx without lesions or exudates.  Teeth in good repair. Neck:  No deformities, masses, or tenderness noted. Chest Wall:  No deformities, masses,  tenderness or gynecomastia noted. Lungs:  decreased breath sounds bilaterally with minimal expiratory wheeze on expiration no dullness no rales Heart:  Normal rate and regular rhythm. S1 and S2 normal without gallop, murmur, click, rub or other extra sounds. Abdomen:  Bowel sounds positive,abdomen soft and non-tender without masses, organomegaly or hernias noted. Rectal:  not examined Msk:  tenderness over the left a.c. joint but does have good movement Pulses:  R and L carotid,radial,femoral,dorsalis pedis and posterior tibial pulses are full and equal bilaterally Extremities:  trace pretibial edema bilaterally   Impression & Recommendations:  Problem # 1:  CLOSED DISLOCATION OF ACROMIOCLAVICULAR (ICD-831.04) Assessment Improved  Problem # 2:  ATRIAL FIBRILLATION, PAROXYSMAL (ICD-427.31) Assessment: Improved  His updated medication list for this problem includes:    Adult Aspirin Ec Low Strength 81 Mg Tbec (Aspirin) ..... Once daily    Warfarin Sodium 5 Mg Tabs (Warfarin sodium) ..... Use as directed by anticoagulation clinic present sig: 1 tab by mouth qpm except m, w, f to take 1 1/2 tabs by mouth  Problem # 3:  CAD (ICD-414.00)  His updated medication list for this problem includes:    Adult Aspirin Ec Low Strength 81 Mg Tbec (Aspirin) ..... Once daily    Isosorbide Mononitrate Cr 30 Mg Xr24h-tab (Isosorbide mononitrate) .Marland Kitchen... Take 1/2 by mouth once daily  Problem # 4:  HYPERTENSION, BENIGN (ICD-401.1) Assessment: Improved  Orders: Prescription Created Electronically [phone])  Problem # 5:  BACK PAIN, CHRONIC (ICD-724.5) Assessment: Unchanged  His updated medication list for this problem includes:    Adult Aspirin Ec Low Strength 81 Mg Tbec (Aspirin) ..... Once daily  Problem # 6:  EDEMA (ICD-782.3) Assessment: Improved  Problem # 7:  ERECTILE DYSFUNCTION (ICD-302.72) Assessment: Unchanged  The following medications were removed from the medication list:    Viagra  100 Mg Tabs (Sildenafil citrate) .Marland Kitchen... 1  as instructed  Problem # 9:  INSOMNIA (ICD-780.52) Assessment: Improved  His updated medication list for this problem includes:    Temazepam 30 Mg Caps (Temazepam) .Marland Kitchen... Take 1 tab by mouth at bedtime, refill onschedule  Problem # 10:  RESTLESS LEG SYNDROME (ICD-333.94) Assessment: Improved  Problem # 11:  CORONARY ATHEROSCLEROSIS, ARTERY BYPASS GRAFT (ICD-414.04) Assessment: Improved  His updated medication list for this problem includes:    Adult Aspirin Ec Low Strength 81 Mg Tbec (Aspirin) ..... Once daily    Isosorbide Mononitrate Cr 30 Mg Xr24h-tab (Isosorbide mononitrate) .Marland Kitchen... Take 1/2 by mouth once daily  Problem # 12:  COPD (ICD-496) Assessment: Improved  His updated medication list for this problem includes:    Ventolin Hfa 108 (90 Base) Mcg/act Aers (Albuterol sulfate) .Marland Kitchen... 2 puffs by mouth three times a day as needed for copd/asthma  Complete Medication List: 1)  Adult Aspirin Ec Low Strength 81 Mg Tbec (Aspirin) .... Once daily 2)  Isosorbide Mononitrate Cr 30 Mg Xr24h-tab (Isosorbide mononitrate) .... Take 1/2 by mouth once daily 3)  Requip 4 Mg Tabs (Ropinirole hcl) .Marland Kitchen.. 1 tab 1 hr before bedtime and 1/2 tab each morning 4)  Prilosec 40 Mg Cpdr (Omeprazole) .... Take 1  am and pm 5)  Allegra 180 Mg Tabs (Fexofenadine hcl) .... One tab daily 6)  Simvastatin 10  Mg Tabs (Simvastatin) .... Take one tablet by mouth daily at bedtime 7)  Warfarin Sodium 5 Mg Tabs (Warfarin sodium) .... Use as directed by anticoagulation clinic present sig: 1 tab by mouth qpm except m, w, f to take 1 1/2 tabs by mouth 8)  Epipen 2-pak 0.3 Mg/0.69ml (1:1000) Devi (Epinephrine hcl (anaphylaxis)) .... Use as directed 9)  Temazepam 30 Mg Caps (Temazepam) .... Take 1 tab by mouth at bedtime, refill onschedule 10)  Ventolin Hfa 108 (90 Base) Mcg/act Aers (Albuterol sulfate) .... 2 puffs by mouth three times a day as needed for copd/asthma 11)  Beta Glucan  500mg   .... 1 daily 12)  Chlorella 335mg   .... 2 daily 13)  Multivitamins Tabs (Multiple vitamin) 14)  Lotrisone 1-0.05 % Crea (Clotrimazole-betamethasone) .... Apply to rash two times a day thinly but thoroughly for 14 days, if reoccurs repeat  Patient Instructions: 1)  vasculature problems have improved but she must continue taking the recommended medications as prescribed medications have been renewed 2)  Return as needed and at the time of the year with examination Prescriptions: LOTRISONE 1-0.05 % CREA (CLOTRIMAZOLE-BETAMETHASONE) apply to rash two times a day thinly but thoroughly for 14 days, if reoccurs repeat  #30 gms x 1   Entered and Authorized by:   Judithann Sheen MD   Signed by:   Judithann Sheen MD on 09/07/2010   Method used:   Electronically to        [id number]* (retail)       [address]       [place], [place]  [address]       Ph: [phone] or [phone]       Fax: [phone]   RxID:   [id number] TEMAZEPAM 30 MG CAPS (TEMAZEPAM) Take 1 tab by mouth at bedtime, refill onschedule  #30 x 5   Entered and Authorized by:   Judithann Sheen MD   Signed by:   Judithann Sheen MD on 09/07/2010   Method used:   Print then Give to Patient   RxID:   [id number] TEMAZEPAM 30 MG CAPS (TEMAZEPAM) Take 1 tab by mouth at bedtime  #30 x 5   Entered and Authorized by:   Judithann Sheen MD   Signed by:   Judithann Sheen MD on 09/07/2010   Method used:   Print then Give to Patient   RxID:   [id number] WARFARIN SODIUM 5 MG TABS (WARFARIN SODIUM) Use as directed by Anticoagulation Clinic Present sig: 1 tab by mouth qpm except M, W, F to take 1 1/2 tabs by mouth  #40 x 11   Entered and Authorized by:   Judithann Sheen MD   Signed by:   Judithann Sheen MD on 09/07/2010   Method used:   Electronically to        [id number]* (retail)       [address]        [place], [place]  [address]       Ph: [phone] or [phone]       Fax: [phone]   RxID:   [id number] SIMVASTATIN 10 MG TABS (SIMVASTATIN) Take one tablet by mouth daily at bedtime  #90 x 3   Entered and Authorized by:   Judithann Sheen MD  Signed by:   Judithann Sheen MD on 09/07/2010   Method used:   Electronically to        [id number]* (retail)       [address]       [place], [place]  [address]       Ph: [phone] or [phone]       Fax: [phone]   RxID:   [id number] PRILOSEC 40 MG  CPDR (OMEPRAZOLE) take 1  AM and PM  #180 x 3   Entered and Authorized by:   Judithann Sheen MD   Signed by:   Judithann Sheen MD on 09/07/2010   Method used:   Electronically to        [id number]* (retail)       [address]       [place], [place]  [address]       Ph: [phone] or [phone]       Fax: [phone]   RxID:   [id number] REQUIP 4 MG  TABS (ROPINIROLE HCL) 1 tab 1 hr before bedtime and 1/2 tab each morning  #45 x 11   Entered and Authorized by:   Judithann Sheen MD   Signed by:   Judithann Sheen MD on 09/07/2010   Method used:   Electronically to        [id number] (retail)       [address]       [place], [place]  [address]       Ph: [phone] or [phone]       Fax: [phone]   RxID:   [id number] ISOSORBIDE MONONITRATE CR 30 MG XR24H-TAB (ISOSORBIDE MONONITRATE) Take 1/2 by mouth once daily  #15 x 11   Entered and Authorized by:   Judithann Sheen MD   Signed by:   Judithann Sheen MD on 09/07/2010   Method used:   Electronically to        [id number]* (retail)       [address]       [place], [place]  [address]       Ph: [phone] or [phone]       Fax: [phone]   RxID:   [id number]    Orders  Added: 1)  Prescription Created Electronically [G8553] 2)  Est. Patient Level IV [61950]

## 2010-10-07 NOTE — Progress Notes (Signed)
Summary: Pt needs 30 days supply of Temazepam  Phone Note Refill Request Call back at [phone] Message from:  POA Carrie Mew on August 06, 2010 11:38 AM  Refills Requested: Medication #1:  TEMAZEPAM 30 MG CAPS Take 1 tab by mouth at bedtime   Dosage confirmed as above?Dosage Confirmed Pt needs 30 day supply called in to local [facility] on [address] Pt completly out of med.    Method Requested: Telephone to Pharmacy Initial call taken by: Lucy Antigua,  August 06, 2010 11:40 AM  Follow-up for Phone Call        he was given a 6 month supply in July. He got #90 and he still has a refill remaining  Follow-up by: Nelwyn Salisbury MD,  August 06, 2010 12:57 PM  Additional Follow-up for Phone Call Additional follow up Details #1::        pt will call pharm Additional Follow-up by: Heron Sabins,  August 09, 2010 3:14 PM    Additional Follow-up for Phone Call Additional follow up Details #2::    refill prescription for 30 days Follow-up by: Judithann Sheen MD,  August 20, 2010 2:43 PM

## 2010-10-07 NOTE — Cardiovascular Report (Signed)
Summary: Office Visit Remote   Office Visit Remote   Imported By: Roderic Ovens 09/24/2010 10:30:17  _____________________________________________________________________  External Attachment:    Type:   Image     Comment:   External Document

## 2010-10-07 NOTE — Cardiovascular Report (Signed)
Summary: Office Visit   Office Visit   Imported By: Roderic Ovens 08/17/2010 09:11:06  _____________________________________________________________________  External Attachment:    Type:   Image     Comment:   External Document

## 2010-10-07 NOTE — Letter (Signed)
Summary: Remote Device Check  [facility], Main Office  [address]   [place], [place] [address]   Phone: [phone]  Fax: [phone]     September 20, 2010 MRN: [id number]   [name] [name] [address] [facility] [place], [place]  [address]   Dear [name],   Your remote transmission was recieved and reviewed by your physician.  All diagnostics were within normal limits for you.  __X___Your next transmission is scheduled for: 12-09-2010.  Please transmit at any time this day.  If you have a wireless device your transmission will be sent automatically.   Sincerely,  Vella Kohler

## 2010-10-07 NOTE — Medication Information (Signed)
Summary: rov/sp  Anticoagulant Therapy  Managed by: Bethena Midget, RN, BSN Referring MD: Johney Frame MD, Fayrene Fearing PCP: Judithann Sheen MD Supervising MD: Gala Romney MD, Reuel Boom Indication 1: Atrial Fibrillation Lab Used: [facility] [place] Site: [address] INR POC 1.8 INR RANGE 2.0-3.0  Dietary changes: no    Health status changes: no    Bleeding/hemorrhagic complications: no    Recent/future hospitalizations: no    Any changes in medication regimen? no    Recent/future dental: no  Any missed doses?: no       Is patient compliant with meds? yes       Allergies: 1)  ! Pcn 2)  Amoxicillin (Amoxicillin)  Anticoagulation Management History:      The patient is taking warfarin and comes in today for a routine follow up visit.  Positive risk factors for bleeding include an age of 75 years or older.  The bleeding index is 'intermediate risk'.  Positive CHADS2 values include History of HTN and Age > 75 years old.  His last INR was 1.1 ratio.  Anticoagulation responsible provider: Bensimhon MD, Reuel Boom.  INR POC: 1.8.  Cuvette Lot#: [id number].  Exp: 10/2011.    Anticoagulation Management Assessment/Plan:      The patient's current anticoagulation dose is Warfarin sodium 5 mg tabs: Use as directed by Anticoagulation Clinic Present sig: 1 tab by mouth qpm except M, W, F to take 1 1/2 tabs by mouth.  The target INR is 2.0-3.0.  The next INR is due 09/09/2010.  Anticoagulation instructions were given to patient.  Results were reviewed/authorized by Bethena Midget, RN, BSN.  He was notified by Bethena Midget, RN, BSN.         Prior Anticoagulation Instructions: INR 2.3  Continue same dose of 1 tablet every day except 1 1/2 tablets on Monday, Wednesday and Friday.  Recheck INR in 4 weeks.   Current Anticoagulation Instructions: INR 1.8 Today take 1.5 pills then resume 1 pill everyday except 1.5 pills on Mondays, Wednesdays and Fridays. Recheck in 3 weeks.

## 2010-10-21 ENCOUNTER — Encounter (INDEPENDENT_AMBULATORY_CARE_PROVIDER_SITE_OTHER): Payer: Medicare Other

## 2010-10-21 ENCOUNTER — Encounter: Payer: Self-pay | Admitting: Internal Medicine

## 2010-10-21 DIAGNOSIS — Z7901 Long term (current) use of anticoagulants: Secondary | ICD-10-CM

## 2010-10-21 DIAGNOSIS — I4891 Unspecified atrial fibrillation: Secondary | ICD-10-CM

## 2010-10-27 NOTE — Medication Information (Signed)
Summary: ccr/tmj  Anticoagulant Therapy  Managed by: Weston Brass, PharmD Referring MD: Johney Frame MD, Fayrene Fearing PCP: Judithann Sheen MD Supervising MD: Tenny Craw MD, Gunnar Fusi Indication 1: Atrial Fibrillation Lab Used: [facility] [place] Site: [address] INR POC 2.0 INR RANGE 2.0-3.0  Dietary changes: no    Health status changes: no    Bleeding/hemorrhagic complications: no    Recent/future hospitalizations: no    Any changes in medication regimen? no    Recent/future dental: no  Any missed doses?: no       Is patient compliant with meds? yes       Allergies: 1)  ! Pcn 2)  Amoxicillin (Amoxicillin)  Anticoagulation Management History:      The patient is taking warfarin and comes in today for a routine follow up visit.  Positive risk factors for bleeding include an age of 75 years or older.  The bleeding index is 'intermediate risk'.  Positive CHADS2 values include History of HTN and Age > 75 years old.  His last INR was 1.1 ratio.  Anticoagulation responsible provider: Tenny Craw MD, Gunnar Fusi.  INR POC: 2.0.  Cuvette Lot#: [id number].  Exp: 09/2011.    Anticoagulation Management Assessment/Plan:      The patient's current anticoagulation dose is Warfarin sodium 5 mg tabs: Use as directed by Anticoagulation Clinic Present sig: 1 tab by mouth qpm except M, W, F to take 1 1/2 tabs by mouth.  The target INR is 2.0-3.0.  The next INR is due 11/18/2010.  Anticoagulation instructions were given to patient.  Results were reviewed/authorized by Weston Brass, PharmD.  He was notified by Margot Chimes PharmD Candidate.         Prior Anticoagulation Instructions: INR 2.2  Continue same dose of 1 1/2 tablets every day except 1 tablet on Tuesday, Thursday and Saturday.  Recheck INR in 4 weeks.   Current Anticoagulation Instructions: INR 2.0  Continue to take your Coumadin until we decide whether you want to switch to the new medication.  Continue to take 1 and 1/2 tablets daily  except for Tuesdays, Thursdays, and Saturdays when you only take 1 tablet.  Recheck INR in 4 weeks.    The name of the new medication is Pradaxa.  You have to take it twice a day and it can not be put in a pill box.  Please let us know if you decide that you want to switch to this medication.

## 2010-11-01 ENCOUNTER — Encounter: Payer: Self-pay | Admitting: Cardiology

## 2010-11-01 DIAGNOSIS — I4891 Unspecified atrial fibrillation: Secondary | ICD-10-CM

## 2010-11-08 ENCOUNTER — Encounter: Payer: Self-pay | Admitting: Internal Medicine

## 2010-11-08 ENCOUNTER — Encounter (INDEPENDENT_AMBULATORY_CARE_PROVIDER_SITE_OTHER): Payer: Medicare Other

## 2010-11-08 DIAGNOSIS — I495 Sick sinus syndrome: Secondary | ICD-10-CM

## 2010-11-16 NOTE — Procedures (Signed)
Summary: sjm ppm/check rv lead impedance   Current Medications (verified): 1)  Adult Aspirin Ec Low Strength 81 Mg  Tbec (Aspirin) .... Once Daily 2)  Isosorbide Mononitrate Cr 30 Mg Xr24h-Tab (Isosorbide Mononitrate) .... Take 1/2 By Mouth Once Daily 3)  Requip 4 Mg  Tabs (Ropinirole Hcl) .Marland Kitchen.. 1 Tab 1 Hr Before Bedtime and 1/2 Tab Each Morning 4)  Prilosec 40 Mg  Cpdr (Omeprazole) .... Take 1  Am and Pm 5)  Allegra 180 Mg Tabs (Fexofenadine Hcl) .... One Tab Daily 6)  Simvastatin 10 Mg Tabs (Simvastatin) .... Take One Tablet By Mouth Daily At Bedtime 7)  Warfarin Sodium 5 Mg Tabs (Warfarin Sodium) .... Use As Directed By Anticoagulation Clinic Present Sig: 1 Tab By Mouth Qpm Except M, W, F To Take 1 1/2 Tabs By Mouth 8)  Epipen 2-Pak 0.3 Mg/0.75ml (1:1000) Devi (Epinephrine Hcl (Anaphylaxis)) .... Use As Directed 9)  Temazepam 30 Mg Caps (Temazepam) .... Take 1 Tab By Mouth At Bedtime, Refill Onschedule 10)  Ventolin Hfa 108 (90 Base) Mcg/act Aers (Albuterol Sulfate) .... 2 Puffs By Mouth Three Times A Day As Needed For Copd/asthma 11)  Multivitamins  Tabs (Multiple Vitamin) 12)  Lotrisone 1-0.05 % Crea (Clotrimazole-Betamethasone) .... Apply To Rash Two Times A Day Thinly But Thoroughly For 14 Days, If Reoccurs Repeat  Allergies (verified): 1)  ! Pcn 2)  Amoxicillin (Amoxicillin)  PPM Specifications Following MD:  Hillis Range, MD     PPM Vendor:  [employer]     PPM Model Number:  [id number]     PPM Serial Number:  [id number] PPM DOI:  03/02/2009     PPM Implanting MD:  Hillis Range, MD  Lead 1    Location: [facility]     DOI: 03/02/2009     Model #: [id number]     Serial #: [id number]     Status: active Lead 2    Location: RV     DOI: 03/02/2009     Model #: [id number]     Serial #: [id number]     Status: active  Magnet Response Rate:  BOL 100 ERI 85  Indications:  Type II heart block   PPM Follow Up Pacer Dependent:  No      Episodes Coumadin:  Yes  Parameters Mode:  DDD     Lower Rate Limit:  60      Upper Rate Limit:  120 Paced AV Delay:  200     Sensed AV Delay:  200 Tech Comments:  see paceart report. Vella Kohler  November 08, 2010 10:49 AM

## 2010-11-18 ENCOUNTER — Encounter: Payer: Self-pay | Admitting: Family Medicine

## 2010-11-23 ENCOUNTER — Ambulatory Visit (INDEPENDENT_AMBULATORY_CARE_PROVIDER_SITE_OTHER): Payer: Medicare Other | Admitting: *Deleted

## 2010-11-23 DIAGNOSIS — I4891 Unspecified atrial fibrillation: Secondary | ICD-10-CM

## 2010-11-23 DIAGNOSIS — Z7901 Long term (current) use of anticoagulants: Secondary | ICD-10-CM

## 2010-11-23 NOTE — Cardiovascular Report (Signed)
Summary: Office Visit   Office Visit   Imported By: Roderic Ovens 11/15/2010 14:31:37  _____________________________________________________________________  External Attachment:    Type:   Image     Comment:   External Document

## 2010-11-23 NOTE — Patient Instructions (Signed)
INR 2.4 Continue to take 1 tablet daily, except take 1 1/2 tablets on Mondays, Wednesdays, and Fridays. Recheck INR in 4 weeks.

## 2010-12-14 LAB — CARDIAC PANEL(CRET KIN+CKTOT+MB+TROPI)
Relative Index: 1.8 (ref 0.0–2.5)
Relative Index: 1.8 (ref 0.0–2.5)
Total CK: 269 U/L — ABNORMAL HIGH (ref 7–232)
Troponin I: 0.01 ng/mL (ref 0.00–0.06)
Troponin I: 0.02 ng/mL (ref 0.00–0.06)

## 2010-12-14 LAB — CBC
HCT: 36.1 % — ABNORMAL LOW (ref 39.0–52.0)
HCT: 40.5 % (ref 39.0–52.0)
Hemoglobin: 12.4 g/dL — ABNORMAL LOW (ref 13.0–17.0)
Hemoglobin: 13.9 g/dL (ref 13.0–17.0)
MCHC: 34.2 g/dL (ref 30.0–36.0)
MCHC: 34.3 g/dL (ref 30.0–36.0)
MCV: 93.1 fL (ref 78.0–100.0)
Platelets: 89 10*3/uL — ABNORMAL LOW (ref 150–400)
RBC: 3.88 MIL/uL — ABNORMAL LOW (ref 4.22–5.81)
RDW: 13.1 % (ref 11.5–15.5)
WBC: 6.9 10*3/uL (ref 4.0–10.5)

## 2010-12-14 LAB — BASIC METABOLIC PANEL
BUN: 35 mg/dL — ABNORMAL HIGH (ref 6–23)
CO2: 26 mEq/L (ref 19–32)
Calcium: 9.6 mg/dL (ref 8.4–10.5)
Chloride: 103 mEq/L (ref 96–112)
Creatinine, Ser: 2.19 mg/dL — ABNORMAL HIGH (ref 0.4–1.5)
GFR calc Af Amer: 35 mL/min — ABNORMAL LOW (ref >60)
GFR calc non Af Amer: 39 mL/min — ABNORMAL LOW (ref >60)
Glucose, Bld: 99 mg/dL (ref 70–99)
Potassium: 4.3 mEq/L (ref 3.5–5.1)
Potassium: 5.2 mEq/L — ABNORMAL HIGH (ref 3.5–5.1)
Sodium: 134 mEq/L — ABNORMAL LOW (ref 135–145)
Sodium: 146 mEq/L — ABNORMAL HIGH (ref 135–145)

## 2010-12-14 LAB — DIFFERENTIAL
Basophils Relative: 0 % (ref 0–1)
Eosinophils Absolute: 0 10*3/uL (ref 0.0–0.7)
Eosinophils Relative: 1 % (ref 0–5)
Lymphs Abs: 3.3 10*3/uL (ref 0.7–4.0)
Monocytes Absolute: 0.4 10*3/uL (ref 0.1–1.0)
Monocytes Relative: 6 % (ref 3–12)

## 2010-12-14 LAB — TSH: TSH: 0.627 u[IU]/mL (ref 0.350–4.500)

## 2010-12-14 LAB — TROPONIN I: Troponin I: 0.02 ng/mL (ref 0.00–0.06)

## 2010-12-21 ENCOUNTER — Ambulatory Visit (INDEPENDENT_AMBULATORY_CARE_PROVIDER_SITE_OTHER): Payer: Medicare Other | Admitting: *Deleted

## 2010-12-21 DIAGNOSIS — I4891 Unspecified atrial fibrillation: Secondary | ICD-10-CM

## 2011-01-12 ENCOUNTER — Other Ambulatory Visit: Payer: Self-pay | Admitting: *Deleted

## 2011-01-12 NOTE — Telephone Encounter (Signed)
Pt requesting rx refill of albuterol sent to [facility] on [address]  Rx previously filled with mail order pharmacy.

## 2011-01-13 MED ORDER — ALBUTEROL SULFATE HFA 108 (90 BASE) MCG/ACT IN AERS: 2.0000 | INHALATION_SPRAY | Freq: Four times a day (QID) | RESPIRATORY_TRACT | Status: DC | PRN

## 2011-01-18 ENCOUNTER — Ambulatory Visit (INDEPENDENT_AMBULATORY_CARE_PROVIDER_SITE_OTHER): Payer: Medicare Other | Admitting: *Deleted

## 2011-01-18 DIAGNOSIS — I4891 Unspecified atrial fibrillation: Secondary | ICD-10-CM

## 2011-01-18 LAB — POCT INR: INR: 2.2

## 2011-01-18 NOTE — Procedures (Signed)
DUPLEX ULTRASOUND OF ABDOMINAL AORTA   INDICATION:  Followup of known AAA.   HISTORY:  Diabetes:  No.  Cardiac:  MI in 2007, CABG in 1990.  Hypertension:  No.  Smoking:  Yes, one pack per day.  Connective Tissue Disorder:  Family History:  No.  Previous Surgery:  No.   DUPLEX EXAM:         AP (cm)                   TRANSVERSE (cm)  Proximal             2.12 Cm                   2.33 cm  Mid                  4.21 cm                   4.20 cm  Distal               1.62 cm                   1.70 cm  Right Iliac          1.34 cm                   1.32 cm  Left Iliac           1.38 cm                   1.40 cm   PREVIOUS:  Date: 06/28/07  AP:  4.07  TRANSVERSE:  4.18   IMPRESSION:  1. Stable abdominal aortic aneurysm with maximum diameter measuring      4.21 cm (AP) x 4.20 cm at the mid distal aorta.  2. Mural thrombus noted in abdominal aortic aneurysm.  3. Ectatic common iliac arteries bilaterally.   ___________________________________________  P. Liliane Bade, M.D.   PB/MEDQ  D:  12/06/2007  T:  12/06/2007  Job:  [id number]

## 2011-01-18 NOTE — Assessment & Plan Note (Signed)
[facility]                            CARDIOLOGY OFFICE NOTE   [name] [name]                   MRN:          [id number]  DATE:01/02/2008                            DOB:          1926/04/17    [name] is in for followup.  He is having some problems sleeping, and  Dr. Scotty Court has given him some medications for this.  He unfortunately  continues to smoke about a pack of cigarettes a day, clearly in the  environment of previous significant vascular disease.  He has seen Dr.  Madilyn Fireman in followup for his abdominal aortic aneurysm which is  demonstrated at a 4.2 x 4.2 aneurysm at the mid distal aorta with mural  thrombus noted in the aneurysm.  He has ectatic, common iliac arteries  bilaterally.   MEDICATIONS INCLUDE:  1. Metoprolol 12.5 mg p.o. b.i.d.  2. Isosorbide mononitrate 1/2 tablet p.o. b.i.d.  3. Aspirin 81 mg daily.  4. Flomax 0.4 mg daily.  5. Protonix 40 mg daily.   PHYSICAL EXAMINATION:  He is alert and oriented.  He is in no distress.  The blood pressure is 122/74; the pulse is 63.  There are slight rhonchi noted at the bases.  The cardiac rhythm is regular.   ELECTROCARDIOGRAM:  Demonstrates normal sinus rhythm with first-degree  AV block, PR interval is 256 msec.  There is right bundle and left axis  deviation suggesting a component trifascicular block.   The patient does remain stable on his current regimen.  The patient is  currently 77 years of age, and he does continue to smoke in the setting  of prior vein graft disease.  He and his family are entirely aware of  the potential problems.  He is also statin intolerant.  With regard to  smoking, he has apparently been using tobacco since the age of 27.  We  will have him call to reconfirm his dose at the present time.  Otherwise, we will continue to treat him with his current medical  regimen as this seems to work relatively well for him.  He remains at  high risk for  recurrent cardiac events.     Arturo Morton. Riley Kill, MD, [facility]  Electronically Signed    TDS/MedQ  DD: 01/03/2008  DT: 01/03/2008  Job #: [id number]   cc:   [name] [name]., MD

## 2011-01-18 NOTE — Consult Note (Signed)
NEW PATIENT CONSULTATION   [name] [name] HANSELL  DOB:  Jul 25, 1926                                       12/04/2009  GMWNU#:[id number]   Patient presents today for continued followup of his infrarenal  abdominal aortic aneurysm.  This has been followed in our office with  serial ultrasounds dating back to 2005.  He has had an ultrasound.  This  has showed a slow, continued growth over this period of time.  His  maximal diameter today is 4.5 cm.  He had been followed by Dr. Liliane [name].  I explained to the patient that Dr. Liliane [name] has left our  practice, and I will assume his care.  He denies any symptoms referable  to this.  He is quite active and healthy at his age of 41.  He does not  have diabetes.  No elevated cholesterol.  Did have myocardial infarction  in the 1980s and a history of COPD.  He denies any premature family  history of atherosclerotic disease.   SOCIAL HISTORY:  He is widowed.  He has 2 children.  He is a retired  Proofreader.  He does not smoke, having quit in November of 2009.  He does  have 2 alcohol drinks per day.   REVIEW OF SYSTEMS:  Negative for weight loss or weight gain.  His weight  is reported at 170 pounds.  He is 5 feet 9 inches tall.  CARDIAC:  Noted only for his HPI.  PULMONARY:  He does have shortness of breath with exertion.  GI:  Negative.  GU:  Has urinary frequency.  Vascular, neuro, ortho, psychiatric, ENT, skin are all negative.   PHYSICAL EXAMINATION:  He is a well-built, well-nourished white male  appearing younger than stated age of 89 in no acute distress. Blood  pressure is 135/84, pulse 60, respirations 22, saturations are 98% on  room air.  HEENT is normal.  Lungs:  Clear bilaterally with no rhonchi  or wheezes.  Cardiovascular:  His heart is a regular rate and rhythm.  His carotid arteries are without bruits.  His radial are 2+ bilaterally.  He has 2+ femoral and popliteal and 2+ posterior tibial pulses.  No  evidence of peripheral aneurysm.  Abdomen:  Soft, nontender.  No masses  are noted.  He does have prominent aortic pulsation.  This is nontender.  Musculoskeletal:  There are no major deformities or cyanosis.  Neuro:  No focal weakness or paresthesias.  Skin without ulcers or rashes.   I had a long discussion with patient explaining his slow increased size  over time.  I explained that certainly we would recommend continued 6-  month follow-up with ultrasound and would not recommend anymore invasive  evaluation currently.  I did explain symptoms of leaking aneurysm to him  and the need to call 911 or present to the emergency room of [facility].  He understands this and will be seen in 6 months with repeat ultrasound.     [name], M.D.  Electronically Signed   TFE/MEDQ  D:  12/04/2009  T:  12/04/2009  Job:  [id number]   cc:   [name] [name]., MD

## 2011-01-18 NOTE — Assessment & Plan Note (Signed)
[facility]                            CARDIOLOGY OFFICE NOTE   RAVINDER, LUKEHART                   MRN:          [id number]  DATE:07/24/2008                            DOB:          23-Sep-1925    [name] is in for a followup from a clinical standpoint.  He is stable.  He has not been having any ongoing chest pain or progressive shortness  of breath.  He does continue to smoke about a pack of cigarettes a day.  He has been smoking since age 77 and he is currently 77.  He has no  intentions of quitting under any circumstance.  His second wife recently  died from lung cancer.   He also stopped his Crestor thinking it was keeping him awake at night,  but he has been sleeping well since he got a sleeping pill per Dr.  Scotty Court.   CURRENT MEDICATIONS:  1. Metoprolol tartrate 50 mg one-half tablet b.i.d.  2. Omeprazole 20 mg daily.  3. Isosorbide mononitrate 30 mg one-half tablet b.i.d.  4. Temazepam 30 mg nightly.  5. Ropinirole nightly.  6. Indomethacin 25 mg daily which he takes for maintenance for      prevention of gout.  7. Fexofenadine hydrochloride 180 mg.   PHYSICAL EXAMINATION:  He is alert and oriented.  The weight is 163  pounds, blood pressure 122/68, the pulse is 62.  Heart sounds are quiet.  He does have marked prolonged expiration with decreased breath sounds  bilaterally compatible with rather advanced COPD.  He does not have  palpable abdominal mass, although his recent ultrasound reveals an  aortic aneurysm of 4.2 x 4.2.   His electrocardiogram demonstrates sinus rhythm with first-degree AV  block.  There is left axis deviation and right bundle-branch block.  There is an interseptal MI of indeterminate age.   Overall, the patient has continued to do well.  He has no history of  syncope or presyncope.  His lipids are mildly elevated with an LDL in  the 90s on last check.  He did tolerate Crestor previously, and his LDL  was brought down nicely with it.  I have suggested that he would go back  on Crestor 10 as he was at target with this dose.  If it interferes with  his sleep, then he has to notify us.  We will try to readjust from that  point.  We will get a follow up lipid and liver profile.     Arturo Morton. Riley Kill, MD, [facility]  Electronically Signed    TDS/MedQ  DD: 07/24/2008  DT: 07/24/2008  Job #: [id number]   cc:   Ellin Saba., MD

## 2011-01-18 NOTE — Procedures (Signed)
DUPLEX ULTRASOUND OF ABDOMINAL AORTA   INDICATION:  Followup abdominal aortic aneurysm   HISTORY:  Diabetes:  No  Cardiac:  MI in 2007, CABG in 1990  Hypertension:  No  Smoking:  Yes  Family History:  No  Previous Surgery:  No   DUPLEX EXAM:         AP (cm)                   TRANSVERSE (cm)  Proximal             2.03 cm                   1.95 cm  Mid                  4.07 cm                   4.18 cm  Distal               1.85 cm                   1.81 cm  Right Iliac          1.33 cm                   1.30 cm  Left Iliac           1.35 cm                   1.39 cm   PREVIOUS:  Date: 12/14/2006  AP:  4.17  TRANSVERSE:  4.07   IMPRESSION:  1. Abdominal aortic aneurysm measurements appear stable from previous      study.  2. Mural thrombus noted.   ___________________________________________  P. Liliane Bade, M.D.   AS/MEDQ  D:  06/28/2007  T:  06/29/2007  Job:  [id number]

## 2011-01-18 NOTE — Discharge Summary (Signed)
NAMEDENARD, TUMINELLO NO.:  [id number]   MEDICAL RECORD NO.:  [id number]          PATIENT TYPE:  INP   LOCATION:  3705                         FACILITY:  [facility]   PHYSICIAN:  Marca Ancona, MD      DATE OF BIRTH:  [birth year]   DATE OF ADMISSION:  01/09/2009  DATE OF DISCHARGE:  01/10/2009                               DISCHARGE SUMMARY   PROCEDURES:  1. Orthostatic vital signs.  2. CT of the head without contrast.   PRIMARY FINAL DISCHARGE DIAGNOSIS:  Syncope, felt secondary to  dehydration and possible orthostatic hypotension.   SECONDARY DIAGNOSES:  1. Status post aortocoronary bypass surgery in 1980.  2. Status post cardiac catheterization in 2006 with left anterior      descending totaled, left internal mammary artery to left anterior      descending patent with distal 60% stenosis, diagonal 70%,      circumflex 60-70%, obtuse marginal 50%, right coronary artery      totaled, saphenous vein graft to posterior descending artery 99%,      treated with percutaneous transluminal coronary angioplasty and      bare-metal stent.  3. Abdominal aortic aneurysm.  4. A 77-pack year smoking history.  5. Possible ethyl alcohol abuse.  6. 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  7. Hyperlipidemia.  8. Gout.  9. Gastroesophageal reflux disease.  10.Chronic obstructive pulmonary disease.  11.Benign prostatic hypertrophy.  12.History of lower extremity edema.   TIME AT DISCHARGE:  41 minutes.   HOSPITAL COURSE:  Mr. Fluharty is an 77 year old male with known coronary  artery disease.  He had a syncopal episode on the day of admission when  he had been working outside and also had been recently started on an ACE  inhibitor and a diuretic (within the last week).  He was admitted for  further evaluation and treatment.   His BUN and creatinine were elevated on admission at 39/2.19.  He was  hydrated, and his BUN and creatinine dropped to 35/1.71.  The ACE  inhibitor is  on hold, and the diuretic dose is decreased.   His troponins were all negative.  His CKs were up slightly and the MBs  were also up slightly, but the index was within normal limits.  TSH was  within normal limits at 0.627.  Orthostatic vital signs were positive  with a systolic blood pressure that went from 105 to 89, lying to  sitting.   On Jan 10, 2009, Mr. Galli was evaluated by Dr. Shirlee Latch.  His cardiac  enzymes were negative.  His blood pressure was stabilized, and he was no  longer orthostatic.  He had received IV fluids with good results.  Dr.  Shirlee Latch felt that the symptoms were likely secondary to dehydration and  orthostasis.  There was concern because of the trifascicular block, but  this was found to be old when reviewing prior notes, and no bradycardia  was seen on telemetry.  However, he felt that an outpatient Holter was  indicated and the Lopressor should be held.  Mr. Croft was ambulating  without  chest pain or shortness of breath and considered stable for  discharge with close outpatient followup.  Given ARF, indomethacin  should be stopped and Tylenol used for pain.  Lisinopril/HCTZ was  stopped and patient will be discharged on only HCTZ 12.5 mg daily.  He  will need close followup of blood pressure.   DISCHARGE INSTRUCTIONS:  1. His activity level is to be increased gradually.  2. He is to stick to a low-sodium heart-healthy diet.  3. He is to follow up with Dr. Riley Kill.  He is to follow up with Dr.      Scotty Court as needed.  4. He is to get an echocardiogram, a BMET, and a Holter monitor as an      outpatient prior to visit with Dr. Riley Kill.   DISCHARGE MEDICATIONS:  1. Ropinirole 4 mg daily.  2. Temazepam 30 mg daily.  3. Indomethacin is discontinued.  4. Flomax 0.4 mg b.i.d.  5. Omeprazole 40 mg a day.  6. Isosorbide dose decreased to 15 mg daily.  7. Metoprolol 50 mg is on hold.  8. Lisinopril HCT is discontinued.  9. Hydrochlorothiazide 12.5 mg  daily.      Theodore Demark, PA-C      Marca Ancona, MD  Electronically Signed    RB/MEDQ  D:  01/10/2009  T:  01/11/2009  Job:  [id number]   cc:   Ellin Saba., MD

## 2011-01-18 NOTE — Assessment & Plan Note (Signed)
[facility]                            CARDIOLOGY OFFICE NOTE   SYLVESTER, MINTON                   MRN:          [id number]  DATE:06/07/2007                            DOB:          [birth year]    [name] is in for a followup visit. In general, he is stable. He has  unfortunately resuming smoking about a pack per day. He also stopped  Crestor because he thought it was keeping him up at night. The patient  is scheduled to see Dr.  Madilyn Fireman for followup of his abdominal aortic  aneurysm. He has been largely asymptomatic. The patient had stenting of  the saphenous vein graft to the distal right and a patent internal  mammary to the LAD. He has fairly diffuse disease.   PHYSICAL EXAMINATION:  Blood pressure 110/70 by me. The pulse is 66.  Lung fields are clear.  Cardiac rhythm is regular. There is not a definite murmur. There are no  carotid bruits.   EKG reveals normal sinus rhythm. There is right bundle branch block  associated with first-degree block and left axis deviation. On the  rhythm strip, he does not have any dropped beats.   Overall, he is stable. He is now smoking and not taking his cholesterol  lowering agent and I think he does understand that his cardiac risks are  higher. Based on his wishes, we did not make any changes in his overall  medical regimen today. He will remain on metoprolol at fairly low dose.  We may go ahead and elect to decrease his metoprolol to 12.5 twice a  day. I will see him back in followup in two months.     Arturo Morton. Riley Kill, MD, [facility]  Electronically Signed    TDS/MedQ  DD: 06/07/2007  DT: 06/07/2007  Job #: [id number]

## 2011-01-18 NOTE — H&P (Signed)
NAME:  COHAN, STIPES NO.:  [id number]   MEDICAL RECORD NO.:  [id number]          PATIENT TYPE:  INP   LOCATION:  3705                         FACILITY:  [facility]   PHYSICIAN:  Marca Ancona, MD      DATE OF BIRTH:  05-09-1926   DATE OF ADMISSION:  01/09/2009  DATE OF DISCHARGE:                              HISTORY & PHYSICAL   PRIMARY CARDIOLOGIST:  Arturo Morton. Riley Kill, MD, [facility]   PRIMARY MEDICAL DOCTOR:  Tawny Asal, MD   CHIEF COMPLAINT:  Syncope.   HISTORY OF PRESENT ILLNESS:  Mr. Shatzer is an 77 year old male with a  known history of CAD (CABG and last cardiac cath October 2006, with PCI  to SVG with non-flow drug-eluting platform and otherwise nonobstructive  disease), stable AAA, history of 77-pack-year smoking history recently  quitting in November 2009, history of EtOH abuse, questionable  trifascicular block by current EKG and per notes since October 2008, no  history of significant pauses, and recent treatment with ACE inhibitor  and hydrochlorothiazide, lower extremity edema presenting with syncope.  The patient worked all day yesterday, Jan 08, 2009, in the sun drinking  three small bottles of water, a cup of coffee, and a alcoholic beverage.  Today, he was sitting talking to repairman regarding every expensive  repair to his air conditioner, when he stood up and immediately lost  consciousness.  He was unconscious for 30 seconds and witnesses do not  report any movements, loss of bowel or bladder.  The patient also did  not have postictal state, was only slightly confused for a few minutes  after this event.  The patient denies any chest pain or shortness of  breath prior to symptoms or after.  In fact, the patient denies any  symptoms, whatsoever prior to loosing consciousness.  He has no memory  of this event at all.  Currently in the emergency department, he is  totally asymptomatic.  Vital signs have been stable during his stay in  the ED.   The patient has received 1-1/2 L of IV fluids and no  medications in the ED.   PAST MEDICAL HISTORY:  1. CAD, S/P CABG x2 and last cath, October 2006 with PCI to SVG with      non-flow drug-eluting platform, continue patency of LIMA to LAD,      slightly hazy/irregular disease involving circumflex artery, which      was not involved in bypass, minimal decreased LV function.  2. Stable AAA.  3. History of 77-pack-year smoking, history quitting in November 2009.  4. Hyperlipidemia.  5. Gout.  6. GERD.  7. History of EtOH.  8. COPD.  9. BPH.  10.Recent history of lower extremity edema.   SOCIAL HISTORY:  The patient lives in [place] with his girlfriend.  He is retired, although very active around the house.  He has a 77-pack-  year smoking history, quitting in November 2009.  He has a history of  alcohol abuse and continues to drink very moderately, no illicit drug  use.  No herbal medications.  He has  a regular diet.  No regular  exercise, but very active.   FAMILY HISTORY:  Noncontributory due to the patient's advanced age.   REVIEW OF SYSTEMS:  The patient reports mild nausea after the event  today.  Two other episodes of mild presyncope recently, but no other  syncopal events.  All other systems reviewed and were negative.   ALLERGIES:  PENICILLIN.   MEDICATIONS:  1. Metoprolol 25 mg p.o. b.i.d.  2. Omeprazole 20 mg p.o. daily.  3. Imdur 15 mg p.o. b.i.d.  4. Temazepam 30 mg p.o. nightly.  5. Ropinirole ? mg p.o. q.h.s.  6. Indomethacin 25 mg p.o. daily.  7. Fexofenadine 180 mg p.o. daily.  8. Lisinopril 20 mg p.o. daily.  9. Hydrochlorothiazide 25 mg p.o. daily.  10.Flomax 0.4 mg daily.   PHYSICAL EXAMINATION:  VITAL SIGNS:  Temperature 96.8 degrees  Fahrenheit, BP 108/50, pulse 60, respiratory rate 15, O2 saturation 100%  on 2 L by nasal cannula.  GENERAL:  The patient is alert and oriented x3 in no apparent distress.  He is able to speak and move very easily  without any respiratory  distress.  HEENT:  His head is normocephalic, atraumatic.  Pupils equal, round, and  reactive to light.  Extraocular muscles are intact.  Nares are patent  without discharge.  Oropharynx without erythema or exudates.  NECK:  Supple without lymphadenopathy.  No thyromegaly.  No JVD.  HEART:  Rate is regular with audible S1 and S2.  No clicks, rubs,  murmurs, or gallops.  Pulses are 2+ and equal in both upper and lower  extremities bilaterally.  LUNGS:  Clear to auscultation bilaterally.  SKIN:  No rashes, lesions, or petechiae.  ABDOMEN:  Soft, nontender, nondistended.  Normal abdominal bowel sounds.  No hepatosplenomegaly and no pulsations.  EXTREMITIES:  No clubbing, cyanosis, or edema.  MUSCULOSKELETAL:  No joint deformity or effusions.  No spinal or CVA  tenderness.  NEUROLOGIC:  Cranial nerves II through XII are grossly intact.  Strength  5/5 in all extremities and axial groups.  Normal sensation throughout  and normal cerebellar function.   RADIOLOGY:  Chest x-ray shows previous CABG.  Mild pulmonary scarring.  No acute process evident.  The patient has a CT of the head without  contrast showing old appearing small and large vessel infarctions.  No  sign of acute or reversible process.   EKG shows sinus rhythm with marked primary AV block and right bundle-  branch block with left axis deviation suggesting a component of left  anterior fascicular block.  There are no acute ST-T wave changes, Q-  waves in V1 through V3.  PR 302, QRS 174, QTc 463.  No change from  tracing performed, July 24, 2008.   LABORATORY DATA:  WBC 6.9, HGB 12.4, HCT 36.1, PLT count is 83.  Sodium  134, potassium 4.3, chloride 103, CO2 of 26.  BUN 39, creatinine is  2.19, glucose 147.  BNP is 52, troponin I 0.02.  Magnesium is 2.2.  The  aPTT is 27, then PT is 15.0 and INR is 1.0.   ASSESSMENT AND PLAN:  This is an 77 year old male with a known history  of coronary artery  disease as previously described and right bundle-  branch block with left axis deviation suggesting component of  trifascicular block on present EKG, as well as EKG is dating back to  October 2008, presenting with syncope in the setting of dehydration.   Syncope.  The patient has  a known history of conduction system disease.  Episode could very well have been orthostatic hypotension exacerbated by  dehydration, which is supported by elevated creatinine and loss of  consciousness after moving from a sitting to a standing position;  however, his ECG showing trifascicular block and syncope is worrisome.  Our plan will be to admit the patient to telemetry and hydrate him with  IV fluids with normal saline.  We will check orthostatics and monitor  overnight.  If telemetry is negative for objective evidence to explain  syncope with we may request EP to help with evaluation.  We will also  check an echocardiogram as there is none in E-chart.  We will repeat  basic metabolic panel in the morning and check one set of cardiac  enzymes.  Plan will be discussed with Dr. Jacquelin Hawking, [facility]      Marca Ancona, MD  Electronically Signed    MS/MEDQ  D:  01/09/2009  T:  01/10/2009  Job:  [id number]

## 2011-01-18 NOTE — Procedures (Signed)
DUPLEX ULTRASOUND OF ABDOMINAL AORTA   INDICATION:  Follow-up evaluation of abdominal aortic aneurysm.   HISTORY:  Diabetes:  No.  Cardiac:  MI in 2007, coronary artery bypass graft in 1990.  Hypertension:  No.  Smoking:  A pack per day.  Connective Tissue Disorder:  Family History:  Previous Surgery:   DUPLEX EXAM:         AP (cm)                   TRANSVERSE (cm)  Proximal             2.1 cm                    2.5 cm  Mid                  4.1 cm                    4.1 cm  Distal               1.9 cm                    2.1 cm  Right Iliac          1.2 cm                    1.5 cm  Left Iliac           1.0 cm                    1.7 cm   PREVIOUS:  Date: 12/06/07  AP:  4.2  TRANSVERSE:  4.2   IMPRESSION:  Abdominal aortic aneurysm measurements are stable from  previous study performed on 12/06/2007.   ___________________________________________  P. Liliane Bade, M.D.   MC/MEDQ  D:  06/05/2008  T:  06/05/2008  Job:  [id number]

## 2011-01-18 NOTE — Procedures (Signed)
DUPLEX ULTRASOUND OF ABDOMINAL AORTA   INDICATION:  Followup of abdominal aortic aneurysm.   HISTORY:  Diabetes:  No.  Cardiac:  MI 1980, CABG, pacemaker.  Hypertension:  No.  Smoking:  Previous.  Quit in 2009.  Connective Tissue Disorder:  Family History:  No.  Previous Surgery:  No.   DUPLEX EXAM:         AP (cm)                   TRANSVERSE (cm)  Proximal             2.42 cm                   2.28 cm  Mid                  4.58 cm                   4.5 cm  Distal               1.89 cm                   1.78 cm  Right Iliac          1.11 cm                   1.16 cm  Left Iliac           1.09 cm                   1.39 cm   PREVIOUS:  Date:  06/12/2009  AP:  4.4  TRANSVERSE:  4.1   IMPRESSION:  Abdominal aortic aneurysm with largest measurement 4.58 x  4.5 cm.   ___________________________________________  [name], M.D.   CJ/MEDQ  D:  12/04/2009  T:  12/04/2009  Job:  [id number]

## 2011-01-18 NOTE — Procedures (Signed)
DUPLEX ULTRASOUND OF ABDOMINAL AORTA   INDICATION:  Followup of an abdominal aortic aneurysm.   HISTORY:  Diabetes:  No.  Cardiac:  MI, CABG, pacemaker.  Hypertension:  No.  Smoking:  Yes.  Connective Tissue Disorder:  Family History:  No.  Previous Surgery:  No   DUPLEX EXAM:         AP (cm)                   TRANSVERSE (cm)  Proximal             4.4 cm                    4.1 cm  Mid                  4.2 cm                    3.9 cm  Distal               1.9 cm                    3 cm  Right Iliac          1.3 cm                    1.4 cm  Left Iliac           0.9 cm                    1.3 cm   PREVIOUS:  Date:  AP:  4.3  TRANSVERSE:  3.8   IMPRESSION:  Abdominal aortic aneurysm appears stable at 4.2 x 3.9 cm  mural thrombos noted in a saccular aneurysm.   ___________________________________________  [name], M.D.   CJ/MEDQ  D:  06/12/2009  T:  06/12/2009  Job:  [id number]

## 2011-01-18 NOTE — Discharge Summary (Signed)
[name] [name], FANNING NO.:  [id number]   MEDICAL RECORD NO.:  [id number]          PATIENT TYPE:  INP   LOCATION:  3704                         FACILITY:  [facility]   PHYSICIAN:  Hillis Range, MD       DATE OF BIRTH:  09/14/1925   DATE OF ADMISSION:  03/02/2009  DATE OF DISCHARGE:  03/03/2009                               DISCHARGE SUMMARY   ADDENDUM   ALLERGIES:  PENICILLIN AND AMOXICILLIN.    The patient had a pacemaker implanted on March 02, 2009, as in previous  dictation.   TIME FOR THIS DICTATION:  Greater than 20 minutes.   The patient actually manifested by pacemaker interrogation for  paroxysmal atrial fibrillation because of age, hypertension, and  coronary artery disease which was known.  The patient was placed on  Coumadin.  He starts a 5 mg daily.  He will follow up with the Coumadin  Clinic at [facility] in the [place] office on Friday, March 06, 2009, at 9:15 for a protime check.  Description of how this medication  is taken has been given to the patient.  He is also asked to hold off on  driving until he sees the folks at the [facility] on March 18, 2009.      Maple Mirza, [place]      Hillis Range, MD  Electronically Signed    GM/MEDQ  D:  03/03/2009  T:  03/03/2009  Job:  [id number]   cc:   Ellin Saba., MD  Arturo Morton Riley Kill, MD, [facility]

## 2011-01-18 NOTE — Procedures (Signed)
DUPLEX ULTRASOUND OF ABDOMINAL AORTA   INDICATION:  Abdominal aortic aneurysm.   HISTORY:  Diabetes:  No.  Cardiac:  MI, CABG.  Hypertension:  No.  Smoking:  Yes.  Connective Tissue Disorder:  Family History:  No.  Previous Surgery:  No.   DUPLEX EXAM:         AP (cm)                   TRANSVERSE (cm)  Proximal             2.5 cm                    2.9 cm  Mid                  2.9 cm                    2.9 cm  Distal               4.3 cm                    3.8 cm  Right Iliac          1.1 cm                    1.3 cm  Left Iliac           1.4 cm                    1.6 cm   PREVIOUS:  Date:  06/05/2008  AP:  4.1  TRANSVERSE:  4.1   IMPRESSION:  Saccular aneurysm with nonocclusive mural thrombus noted at  the distal abdominal aorta level with no significant change in maximum  diameters when compared to the previous exam.   ___________________________________________  P. Liliane Bade, M.D.   CH/MEDQ  D:  12/03/2008  T:  12/03/2008  Job:  [id number]
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
## 2011-01-18 NOTE — Op Note (Signed)
NAMEKALEE, MCCLENATHAN NO.:  [id number]   MEDICAL RECORD NO.:  [id number]          PATIENT TYPE:  INP   LOCATION:  3704                         FACILITY:  [facility]   PHYSICIAN:  Hillis Range, MD       DATE OF BIRTH:  [birth year]   DATE OF PROCEDURE:  03/02/2009  DATE OF DISCHARGE:                               OPERATIVE REPORT   SURGEON:  Hillis Range, MD   PREPROCEDURE DIAGNOSES:  1. Syncope.  2. Mobitz II second-degree arteriovenous block.  3. Trifascicular block.   POSTPROCEDURE DIAGNOSES:  1. Syncope.  2. Mobitz II second-degree arteriovenous block.  3. Trifascicular block.   PROCEDURES:  Dual-chamber pacemaker implantation.   INTRODUCTION:  Mr. Lesch is a pleasant 77 year old gentleman with a  history of coronary artery disease, status post CABG with a preserved  ejection fraction, Mobitz II second-degree AV block, tried single block,  and syncope who presents for pacemaker implantation.  The patient had a  syncopal episode in May 2010.  He subsequently had an event monitor  placed, which documented Mobitz II second-degree AV block.  He therefore  presents for dual-chamber pacemaker implantation.   DESCRIPTION OF PROCEDURE:  An informed written consent was obtained and  the patient was brought to the electrophysiology lab in the fasting  state.  He was adequately sedated with intravenous Valium as outlined in  the nursing report.  The patient's left chest was prepped and draped in  the usual sterile fashion by the EP lab staff.  Skin overlying the left  deltopectoral region was infiltrated with lidocaine for local analgesia.  A 4-cm incision was made over the left deltopectoral region.  A left  subcutaneous pacemaker pocket was fashioned using a combination of sharp  and blunt dissection.  Electrocautery was used to assure hemostasis.  Using a percutaneous Seldinger technique, the left axillary vein was  cannulated with fluoroscopic visualization.   Through the left axillary  vein, a [employer] Medical Tendril SDX model 1688 TC - 46 (serial number  [id number]) right atrial lead and a [employer] Medical Tendril SDX model  1688 TC - 58 (serial number [id number]) right ventricular lead were  advanced into the right atrial appendage and right ventricular apex  positions respectively with fluoroscopic visualization.  The leads were  then secured to the pectoralis fascia using #2 silk suture over the  suture sleeves.  Initial lead measurements revealed an atrial lead P-  wave of 3.2 mV with an impedance of 528 ohms and a threshold of 0.9  volts at 0.5 milliseconds.  The right ventricular lead R-wave measured  13.9 mV with an impedance of 1 volt at 0.5 milliseconds and an impedance  of 719 ohms.  The leads were then connected to a [employer] Medical Accent  RF DR model PM [id number] (serial number [id number]) dual-chamber pacemaker.  The  pacemaker was then placed into the previously fashioned left  subcutaneous pacemaker pocket.  The pocket was then irrigated with  copious gentamicin solution.  Electrocautery was again used to assure  hemostasis.  Pocket was then  closed in 2 layers with 2.0 Vicryl suture  for the subcutaneous and subcuticular layers.  Steri-Strips and a  sterile dressing were then applied.  There were no early apparent  complications.   CONCLUSIONS:  1. Sinus rhythm with Mobitz II second-degree arteriovenous block,      trifascicular block, and syncope.  2. Status post successful dual-chamber pacemaker implantation as      described above.  3. No early apparent complications.      Hillis Range, MD  Electronically Signed     JA/MEDQ  D:  03/02/2009  T:  03/02/2009  Job:  [id number]   cc:   Arturo Morton. Riley Kill, MD, [facility]

## 2011-01-21 NOTE — H&P (Signed)
NAME:  [name] [name], [name] [name] NO.:  [id number]   MEDICAL RECORD NO.:  [id number]          PATIENT TYPE:  INP   LOCATION:  2913                         FACILITY:  [facility]   PHYSICIAN:  Tera Mater. Clent Ridges, M.D. [facility] OF BIRTH:  [birth year]   DATE OF ADMISSION:  06/13/2005  DATE OF DISCHARGE:                                HISTORY & PHYSICAL   CHIEF COMPLAINT:  This is a 77 year old male with known coronary artery  disease, complaining of chest pressure, nausea, and belching.   HISTORY OF PRESENT ILLNESS:  The patient had sudden onset about four days  ago of episodes which last about 2 to 2-1/2 hours at a time and then ease  away.  These episodes include pressure in the chest with a mild ache that  radiates down the left arm.  Also constant nausea without vomiting and  frequent loud belching.  There has been no fever, no cough, no abdominal  pain per se.  His bowel movements remain regular.  As we speak, he feels  fine although he is very slightly nauseated.  He is here with his wife who  drove him.  Interestingly, these symptoms exactly mimic the symptoms that he  was admitted for in 1988 when he had a small subendocardial myocardial  infarction.   PAST MEDICAL HISTORY:  The patient has known coronary artery disease and did  have coronary artery bypass grafting surgery I believe in the 1980s.  He was  admitted in 1998 with symptoms similar to above and had a cardiac  catheterization at that time per Maisie Fus D. Riley Kill, M.D. [facility].  It showed a  patent LIMA to the LAD with a distal 50% lesion, also a patent SVG to the  RCA with an 80% lesion to the PDA and a 90% lesion to a posterolateral  branch.  At that time, it was felt he could be managed medically.  He has  done reasonably well up until the past 4 days.  He also has an abdominal  aortic aneurysm.  He has had a hernia repair and he has COPD with continued  smoking unfortunately.   ALLERGIES:  PENICILLIN.   CURRENT  MEDICATIONS:  1.  Metoprolol 50 mg 1/2 tablet per day.  2.  Isosorbide 60 mg 1/2 tablet per day.  3.  Aspirin 325 mg per day.  4.  Flomax 0.4 mg per day.  5.  Albuterol inhaler as needed.  6.  Spiriva inhaler daily.   HABITS:  He has consumed alcohol for some time.  He continues to smoke daily  and has for most of his life.   FAMILY HISTORY:  Noncontributory.   PHYSICAL EXAMINATION:  GENERAL:  He is alert and in no acute distress, but  he does belch frequently.  VITAL SIGNS:  Blood pressure 106/70, pulse 60 and somewhat irregular.  Respirations 18 and comfortable.  He is afebrile.  Weight is 166 pounds.  SKIN:  Warm and dry.  HEENT:  Ears clear, eyes clear, oropharynx clear.  NECK:  Supple without lymphadenopathy or masses.  LUNGS:  Soft, diffuse wheezes  and some decreased air flow, but there are no  rales.  HEART:  Irregularly irregular.  He has a 2/6 systolic murmur loudest at the  base.  Distal pulses are full.  ABDOMEN:  Soft and normal bowel sounds, nontender, no masses.  EXTREMITIES:  No cyanosis, clubbing, or edema.  NEUROLOGY:  Grossly intact.   EKG today shows no change from his baseline obtained 1 year ago.  He has  sinus rhythm with occasional PVCs, he has first degree AV block.  He has  some nonspecific lateral ST changes as well.   ASSESSMENT:  Possible unstable angina with symptoms suggestive of inferior  ischemia.  He will be admitted to a monitored bed on the cardiac unit.  EMS  was called to transport him there.  He was placed on 2 liters of nasal  cannula oxygen.  We will get blood work including a series of cardiac  enzymes.  I spoke with Salvadore Farber, M.D. [facility] who  will consult with [place].           ______________________________  Tera Mater. Clent Ridges, M.D. [facility]     SAF/MEDQ  D:  06/13/2005  T:  06/13/2005  Job:  [id number]

## 2011-01-21 NOTE — Assessment & Plan Note (Signed)
[facility]                            CARDIOLOGY OFFICE NOTE   KASHMIR, LYSAGHT                   MRN:          [id number]  DATE:12/07/2006                            DOB:          1925/10/18    Mr. Noboa is in for a 6 month followup.  He has not been having chest  pain or significant shortness of breath.  He has no specific complaints.  He unfortunately does continue to smoke a bit.  He stopped his Crestor  at his own.  He had laboratory studies done by Dr. Scotty Court yesterday.  This did reveal a rise in the LDL cholesterol from its 71 up into the  90s range.  His LDL is 93.   CURRENT MEDICATIONS:  Include:  1. Metoprolol 50 mg 1/2 tablet b.i.d.  2. Aspirin 81 mg daily.  3. Isosorbide.  4. Mononitrate 60 mg 1/2 tablet daily.  5. Flomax 0.4 mg daily.  6. Pantoprazole 40 mg daily.  7. Avodart 0.5 daily.  8. Albuterol.   His weight is 173 pounds.  Blood pressure 121/54.  The pulse is 61.  Lung fields reveal decreased breath sounds bilaterally, compatible with  chronic obstructive pulmonary disease.  Heart sounds are distant, I cannot hear or appreciate a murmur.   IMPRESSION:  1. Status post percutaneous stenting with non-drug eluting stent for      saphenous vein graft disease.  2. Hypercholesterolemia on lipid lowering therapy previously, but      recently stopped.  3. Abdominal aortic aneurysm followed by Dr. Madilyn Fireman.  4. Chronic obstructive pulmonary disease with continued tobacco use.   PLAN:  1. Continue medical regimen with the addition of Crestor 10 mg nightly      back to regimen.  2. Return to clinic in 6 months.  3. Reinforce the decision not to smoke.     Arturo Morton. Riley Kill, MD, [facility]  Electronically Signed    TDS/MedQ  DD: 12/07/2006  DT: 12/07/2006  Job #: [id number]   cc:   Ellin Saba., MD

## 2011-01-21 NOTE — Assessment & Plan Note (Signed)
[facility]                              CARDIOLOGY OFFICE NOTE   ROC, STREETT                   MRN:          [id number]  DATE:06/07/2006                            DOB:          Sep 02, 1926    REASON FOR VISIT:  [name] [name] is in for followup.  In general, he has been  stable.  He rarely has some sharp chest discomfort.  He does, unfortunately,  continue to smoke.  He has had no recurrent symptoms that are ischemic-  suggestive.   PHYSICAL EXAMINATION:  VITAL SIGNS:  On his examination today the weight is  174 pounds.  Blood pressure 118/62.  Pulse 51.  LUNGS:  Reveal expiratory wheezes that are his baseline.  HEART:  The PMI is nondisplaced.  There is a normal first and second heart  sound.  No murmurs, rubs or gallops are appreciated.   CLINICAL DATA:  The ECG reveals sinus bradycardia with first degree AV  block.   IMPRESSION:  1. Coronary artery disease status post percutaneous stenting with a non      drug-eluting platform.  2. History of prior alcohol abuse, now improved.  3. Hypercholesterolemia on lipid-lowering therapy.  4. Abdominal aortic aneurysm, followed by [name] [name].  5. Chronic obstructive pulmonary disease.  6. Status post two vessel coronary artery bypass grafting surgery in 1980.   PLAN:  1. Continue current medical regimen.  2. Encouraged discontinuation of smoking.  3. Change metoprolol to one-half tablet b.i.d. so that his total 50 mg is      delivered over two doses.  4. Return to clinic in six months.  5. Decrease aspirin to 81 mg daily.       [name] [name]. Riley Kill, MD, [facility]     TDS/MedQ  DD:  06/07/2006  DT:  06/08/2006  Job #:  [id number]

## 2011-01-21 NOTE — Discharge Summary (Signed)
NAME:  [name] [name], [name] [name] NO.:  [id number]   MEDICAL RECORD NO.:  [id number]          PATIENT TYPE:  INP   LOCATION:  4709                         FACILITY:  [facility]   PHYSICIAN:  Arturo Morton. Riley Kill, M.D. [place] OF BIRTH:  28-Jan-1926   DATE OF ADMISSION:  06/13/2005  DATE OF DISCHARGE:  06/17/2005                                 DISCHARGE SUMMARY   PRINCIPAL DIAGNOSIS:  Coronary artery disease, status post myocardial  infarction.   PROCEDURES PERFORMED DURING THIS ADMISSION:  1.  Left heart catheterization on June 14, 2005.  2.  Selective coronary arteriography.  3.  Saphenous vein graft angiography.  4.  Selective left internal mammary artery angiography.  5.  Percutaneous intervention of the saphenous vein graft to the right      coronary artery using a Spider catheter as well as a 3.0 x 18 Vision      stent dilated to 3.75 mm.   SECONDARY DIAGNOSES:  1.  Status post two-vessel coronary artery bypass graft in 1980.  2.  Status post a hernia repair.  3.  Abdominal aortic aneurysm.  4.  Chronic obstructive pulmonary disease with continued tobacco abuse.  5.  Gout.  6.  Chronic first degree arteriovenous block.  7.  Hyperlipidemia.   ALLERGIES:  The patient is allergic to PENICILLIN with a questionable  Lipitor intolerance.   HISTORY OF PRESENT ILLNESS:  The patient was admitted on June 13, 2005,  with chest pressure.  The patient has a previous history of coronary artery  disease and had a previous coronary artery bypass approximately 1980s.  Upon  admission, the patient had 2-1/2 hours of chest pain and associated nausea  without vomiting but frequent loud belching.  Cardiac catheterization was  planned for June 14, 2005.  Cardiac catheterization showed prior coronary  artery bypass surgery with new onset of high-grade saphenous vein graft  disease with a successful percutaneous intervention with a non-flow drug-  eluting platform.  Next was  continued patency of the internal mammary to the  left anterior descending.  Next, there was some slightly hazy irregular  disease involving the large circumflex system, which was not previously  bypassed.  It also showed a minimal reduction in overall left ventricular  systolic function and a known abdominal aortic aneurysm.  The patient  tolerated cardiac catheterization well.  After cardiac catheterization,  belching decreased.  Tobacco cessation counseling was given to the patient.  On June 17, 2005, Dr. Riley Kill saw the patient and deemed the patient  stable to be discharged to home.   DISCHARGE DIAGNOSES:  Status post myocardial infarction.   The patient will be followed up by Dr. Uvaldo Rising. on October 23 at 11  o'clock.  Will need to draw a CBC at that point to follow up on patient's  platelet count.   DISCHARGE LABORATORY DATA:  Discharge labs include a lipid profile with a  total cholesterol of 140, triglycerides 73, HDL of 50 and LDL of 75.  Sodium  137, potassium 3.8, chloride 105, CO2 26, glucose is 152, and a BUN of  10.  White blood cell count of 12.1, hematocrit of 13.7, hematocrit of 40.8,  platelet count of 85.  Cardiac markers trended down prior to patient's  discharge.   DISCHARGE MEDICATIONS:  1.  Protonix 40 mg p.o. daily.  2.  Aspirin 325 mg p.o. daily.  3.  Isosorbide mononitrate 15 mg p.o. daily.  Isosorbide mononitrate is a      decrease from previous dose.  4.  Atorvastatin 40 mg p.o. q.h.s.  This is a decrease from a previous dose.  5.  Albuterol 2.5/3 mL inhaler q.4h. p.r.n.  6.  Ipratropium bromide 0.5 inhaler q.4h. p.r.n.  7.  Clopidogrel 75 mg p.o. daily.  8.  Metoprolol 25 mg p.o. b.i.d.  9.  Tamsulosin 0.4 mg p.o. daily.   OUTSTANDING LABS:  The patient will need a CBC follow-up on discharge as  platelet count was somewhat decreased at discharge.   Duration of encounter is less than 30 minutes.     ______________________________  April  Humphrey, NP      Arturo Morton. Riley Kill, M.D. [facility]  Electronically Signed    AH/MEDQ  D:  06/17/2005  T:  06/17/2005  Job:  [id number]   cc:   Ellin Saba., M.D.  [address] [place]  [place] [address]

## 2011-01-21 NOTE — Cardiovascular Report (Signed)
NAMEOWIN, VIGNOLA NO.:  [id number]   MEDICAL RECORD NO.:  [id number]          PATIENT TYPE:  INP   LOCATION:  2913                         FACILITY:  [facility]   PHYSICIAN:  Arturo Morton. Riley Kill, M.D. [facility] OF BIRTH:  August 14, 1926   DATE OF PROCEDURE:  06/14/2005  DATE OF DISCHARGE:                              CARDIAC CATHETERIZATION   [name] [name] is a 77 year old gentleman well-known to [place]. He has had previous  revascularizations surgery. He unfortunately has continued to smoke. He has  a well-known abdominal aortic aneurysm. He was followed by Dr. Madilyn Fireman. The  current study was done to assess current anatomy after he presented with a  non-ST-elevation MI. EKG changes were compatible with lateral T-wave  inversions.   PROCEDURES:  1.  Left heart catheterization.  2.  Selective coronary arteriography.  3.  Saphenous vein graft angiography.  4.  Selective left internal mammary angiography.  5.  Percutaneous intervention of the saphenous vein graft to the right      coronary artery utilizing a spider catheter as well as a 3.0 x 18 Vision      stent post dilated to 3.75 mm.   DESCRIPTION OF PROCEDURE:  The patient was brought to the catheterization  laboratory, prepped and draped in the usual fashion. Through an anterior  puncture, the femoral artery was entered. He has a known abdominal aneurysm  which we did not inject today. A guidewire was placed up into the central  aorta. Views of the left and right coronary arteries were obtained. We then  took pictures of the saphenous vein graft following this, internal mammary  angiography was performed with a Glide wire. Access to the left subclavian,  and a internal mammary catheter in the standard right would not fit well.  Following this, central aortic and left ventricular pressures were measured  with pigtail. Ventriculography was performed in the RAO projection. I then  discussed the options with the patient and  subsequently his wife, pastor,  and daughter. With this, preparations were made for percutaneous  intervention. Specifically, the patient had a focal high-grade hazy  saphenous vein graft stenosis in the right coronary graft. We elected to go  ahead and perform percutaneous intervention with this. The patient is not a  good surgical candidate. A JR-4 guiding catheter with side holes was then  utilized along with bivalirudin. Oral clopidogrel was administered. Spider  catheter was used and placed distally. The 4 mm device was utilized. There  was no difficulty deploying the spider catheter over a high-torque floppy  guidewire. This was placed distally. Following this, direct stenting was  performed using a 3.0 x 18 Vision stent. This was taken up to about 16  atmospheres. We then post dilated using a 3.75 Quantum Maverick balloon to  3.75 in the center of 3.5 on the edges. There was marked improvement in the  appearance of the artery with excellent TIMI III flow and no evidence of  significant edge tear. The vessel inserted distally into a somewhat  diffusely diseased right coronary artery. The procedure was completed. All  catheters were removed. The femoral sheath was sewn into place. He was taken  to the holding area in satisfactory clinical condition.   HEMODYNAMIC DATA:  1.  Central aortic pressure 121/61, mean 88.  2.  Left ventricular pressure 121/7. No gradient on pullback across the      aortic valve.   ANGIOGRAPHIC DATA:  1.  The left main coronary artery is calcified but free of critical disease.  2.  Left anterior descending artery demonstrates about 50-60% ostial      narrowing and that is diffusely diseased. It is occluded beyond the      septal perforator and small diagonal.  3.  The internal mammary to the distal LAD remains intact. Just distal to      its insertion is a 60% area of stenosis of a smaller caliber vessel. The      vessel then fills retrograde into one  diagonal and retrograde into a      second diagonal. The retrograde area of the vessel has about 70%      narrowing.  4.  The circumflex vessel itself demonstrates about 30-40% proximal      narrowing. Following this, there was the takeoff of a small marginal and      there was about a 60-70% hypodensity in this location. This does not      appear to be critical, although slightly hypodense. The vessel then      provides another large marginal branch which is calcified and has about      50% and 40% narrowing. There was a smaller AV circumflex, but this also      supplies a posterolateral branch and it specifically is without critical      disease. There may be 30% narrowing proximally in this vessel.  5.  The right coronary artery was essentially occluded after subtotal      occlusion in the proximal to midvessel.  6.  The saphenous vein graft is a fairly smooth appearing graft that has a      hazy subtotally occluded stenosis in the proximal portion of the      saphenous vein graft of the body. The graft inserts distally. The PDA      itself has a fair amount of diffuse mid disease of about 50%. There was      a second branch of the first posterolateral which is also moderate size      and has diffuse 70% to 80% narrowing throughout. The vessel provides two      more posterolateral branches, one of which has also 50-70% narrowing      segmentally. All of these appear to be diffusely irregular.  7.  The ventriculogram demonstrates some hypokinesis of the distal inferior      wall. This could correlate to the critical stenosis in the saphenous      vein graft, or could correlate due to the distal right coronary vessels.  8.  Following percutaneous intervention with the stent as noted, the      stenosis was reduced from a 99% hazy stenosis down to less than 10% with      smooth margins. There was a small amount of debris captured in the      spider catheter.   CONCLUSION: 1.  Prior  coronary bypass surgery with new onset of high-grade saphenous      vein graft disease with successful percutaneous intervention with a non-      flow drug-eluting platform.  2.  Continued patency of the internal mammary to the left anterior      descending artery.  3.  Slightly hazy irregular disease involving the large circumflex system      which is not previously bypassed.  4.  Minimal reduction in overall left ventricular function.  5.  Known abdominal aortic aneurysm.   DISPOSITION:  The patient would be benefited by discontinuation of smoking.  He will need to be on Plavix. His platelet count has followed from 112,000  down to 88,000 and we will need to keep a close eye on this. The heparin  will be discontinued. He will need close follow-up in the clinic.      Arturo Morton. Riley Kill, M.D. [facility]  Electronically Signed     TDS/MEDQ  D:  06/14/2005  T:  06/14/2005  Job:  [id number]   cc:   Jeannett Senior A. Clent Ridges, M.D. [facility]  [address] [place]  [place] [address]   CV Laboratory   Patient's medical record

## 2011-01-21 NOTE — Consult Note (Signed)
NAME:  [name] [name], [name] [name] NO.:  [id number]   MEDICAL RECORD NO.:  [id number]          PATIENT TYPE:  INP   LOCATION:  2913                         FACILITY:  [facility]   PHYSICIAN:  Olga Millers, M.D. [facility] OF BIRTH:  10/02/1925   DATE OF CONSULTATION:  06/13/2005  DATE OF DISCHARGE:                                   CONSULTATION   Mr. Blethen is a 77 year old male with a past medical history of coronary  artery disease, status post coronary artery bypass graft, COPD,  hyperlipidemia, abdominal aortic aneurysm, who we were asked to evaluate for  chest pain.  The patient is status post coronary artery bypass graft  approximately 15 years ago and is followed chronically by Dr. Riley Kill.  He  apparently had his last cardiac catheterization in 1998, but I do not have  those records available.  He apparently had a patent LIMA to the LAD with a  distal 50% lesion, patent saphenous vein graft to the right coronary artery  with an 80% lesion to the PDA and a 90% lesion in the posterolateral.  This  was managed medically.  He typically does not have increased dyspnea on  exertion, orthopnea, PND, pedal edema, palpitations, presyncope, syncope, or  exertional chest pain.  Last Wednesday evening, he had dinner at the  cafeteria.  He ate a large dinner, including chocolate pie, and states that  later that evening, he developed gas in his abdomen.  This radiated to his  chest and created a tight feeling.  There was nausea, but no shortness of  breath or diaphoresis was noted.  The pain improved with belching over 1-2  hours.  He has had subsequent episodes during the day and in the evening  since with his most recent episode last night.  He was seen today by Dr.  Clent Ridges, and his electrocardiogram was noted to be abnormal.  He was admitted  for further evaluation.   MEDICATIONS:  1.  Lopressor 25 mg p.o. daily.  2.  Isosorbide 30 mg p.o. daily.  3.  Aspirin 325 mg p.o. daily.  4.   Flomax.  5.  Albuterol inhaler.  6.  Spiriva.  7.  He takes sublingual nitroglycerin and albuterol as needed.   He is allergic to PENICILLIN.  There is also a question of STATIN  intolerance.   PAST MEDICAL HISTORY:  There is no diabetes mellitus or hypertension, but  there is hyperlipidemia.  He has a history of COPD.  He has a history of  coronary artery disease, status post coronary artery bypass graft, as  described in the HPI.  He does have a history of gout as well as an  abdominal aortic aneurysm that is followed by Dr. Madilyn Fireman.  He has a recent CT  approximately one week ago and states that it was unchanged.  Followup was  recommended.  He has a history of prostate surgery as well as a hernia  repair.   SOCIAL HISTORY:  He has a long history of tobacco use since age 18.  He also  consumes an alcoholic beverage  per evening.   FAMILY HISTORY:  Negative for coronary artery disease.   REVIEW OF SYSTEMS:  He denies any headaches, fevers or chills.  There is no  productive cough or hemoptysis.  There is no dysphagia, odynophagia, melena,  or hematochezia.  There is no dysuria or hematuria.  There is no rash or  seizure activity.  He does have dyspnea with more extreme exertion but not  with routine activities.  There is no orthopnea, PND, or pedal edema.  The  remaining systems are negative.   PHYSICAL EXAMINATION:  VITAL SIGNS:  Blood pressure 110/61, pulse 58.  He is  afebrile.  GENERAL:  He is well-developed and well-nourished in no acute distress.  He  does not appear to be depressed.  SKIN:  Warm and dry.  HEENT:  Unremarkable with normal eyelids.  NECK:  Supple.  __________ bilaterally.  I cannot appreciate bruits.  There  is no jugular venous distention, and no thyromegaly is noted.  CHEST:  Diminished breath sounds throughout, and there is a mild expiratory  wheeze.  He is status post coronary artery bypass grafting.  CARDIOVASCULAR:  Regular rate and rhythm.  His heart  sounds are extremely  distant.  I cannot appreciate murmurs, rubs or gallops.  ABDOMEN:  Nontender, nondistended.  Positive bowel sounds.  No  hepatosplenomegaly.  I cannot appreciate masses.  There is no abdominal  bruit.  He has 2+ femoral pulses bilaterally.  No bruits noted.  EXTREMITIES:  No edema that I can palpate.  He has no cords.  He does have  mild varicosities.  He has 2+ dorsalis pedis and posterior tibial pulses.  NEUROLOGIC:  Grossly intact.   His electrocardiogram shows a sinus rhythm with occasional PVCs.  There is a  first-degree AV block.  The axis is normal.  A prior anterior infarct cannot  be excluded.  There are inferolateral T wave changes.  I do not have a  previous electrocardiogram for comparison.   DIAGNOSES:  1.  Chest pain.  2.  Coronary artery disease, status post coronary artery bypass graft.  3.  Chronic obstructive pulmonary disease.  4.  Tobacco abuse.  5.  History of gout.  6.  Abdominal aortic aneurysm.   PLAN:  [name] presents with chest pain of uncertain etiology.  His  description is somewhat atypical for cardiac ischemia, and I wonder if it  may be GI related.  We will add Protonix; however, it should be noted that  his electrocardiogram does reveal inferolateral T wave changes.  I do not  have an old EKG for comparison.  We will obtain that and if the changes are  new or if his enzymes are positive, we will plan to proceed with cardiac  catheterization.  If his enzymes are negative, and his ECG is unchanged, I  would favor beginning with a functional study.  We would need to discuss  this with Dr. Riley Kill.  As stated previously, we will rule out myocardial  infarction with serial enzymes.  We will continue with aspirin, Lopressor,  and nitrites.  We would also add heparin.  It should be noted that he  apparently is intolerant to statins.  We did discuss his continuous tobacco  use.           ______________________________ Olga Millers, M.D. [facility]     BC/MEDQ  D:  06/13/2005  T:  06/13/2005  Job:  [id number]

## 2011-02-10 ENCOUNTER — Encounter: Payer: Self-pay | Admitting: *Deleted

## 2011-02-15 ENCOUNTER — Ambulatory Visit (INDEPENDENT_AMBULATORY_CARE_PROVIDER_SITE_OTHER): Payer: Medicare Other | Admitting: *Deleted

## 2011-02-15 ENCOUNTER — Encounter: Payer: Self-pay | Admitting: *Deleted

## 2011-02-15 DIAGNOSIS — I4891 Unspecified atrial fibrillation: Secondary | ICD-10-CM

## 2011-02-15 LAB — POCT INR: INR: 2.9

## 2011-02-18 ENCOUNTER — Ambulatory Visit (INDEPENDENT_AMBULATORY_CARE_PROVIDER_SITE_OTHER): Payer: Medicare Other | Admitting: *Deleted

## 2011-02-18 ENCOUNTER — Other Ambulatory Visit: Payer: Self-pay | Admitting: Internal Medicine

## 2011-02-18 DIAGNOSIS — I441 Atrioventricular block, second degree: Secondary | ICD-10-CM

## 2011-02-18 DIAGNOSIS — I4891 Unspecified atrial fibrillation: Secondary | ICD-10-CM

## 2011-02-21 NOTE — Progress Notes (Signed)
Pacer remote check  

## 2011-02-24 ENCOUNTER — Encounter: Payer: Self-pay | Admitting: *Deleted

## 2011-02-25 ENCOUNTER — Encounter (INDEPENDENT_AMBULATORY_CARE_PROVIDER_SITE_OTHER): Payer: Medicare Other

## 2011-02-25 DIAGNOSIS — I714 Abdominal aortic aneurysm, without rupture: Secondary | ICD-10-CM
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
## 2011-03-15 ENCOUNTER — Ambulatory Visit (INDEPENDENT_AMBULATORY_CARE_PROVIDER_SITE_OTHER): Payer: Medicare Other | Admitting: *Deleted

## 2011-03-15 DIAGNOSIS — I4891 Unspecified atrial fibrillation: Secondary | ICD-10-CM

## 2011-03-15 LAB — POCT INR: INR: 3.3

## 2011-04-04 ENCOUNTER — Ambulatory Visit: Payer: Medicare Other | Admitting: Cardiology

## 2011-04-04 ENCOUNTER — Other Ambulatory Visit: Payer: Self-pay | Admitting: *Deleted

## 2011-04-04 MED ORDER — METOPROLOL SUCCINATE ER 25 MG PO TB24: 25.0000 mg | ORAL_TABLET | Freq: Every day | ORAL | Status: DC

## 2011-04-05 ENCOUNTER — Encounter: Payer: Medicare Other | Admitting: *Deleted

## 2011-04-05 ENCOUNTER — Other Ambulatory Visit: Payer: Self-pay | Admitting: *Deleted

## 2011-04-05 ENCOUNTER — Encounter: Payer: Self-pay | Admitting: Cardiology

## 2011-04-05 MED ORDER — METOPROLOL SUCCINATE ER 25 MG PO TB24: 25.0000 mg | ORAL_TABLET | Freq: Every day | ORAL | Status: DC

## 2011-04-06 ENCOUNTER — Ambulatory Visit (INDEPENDENT_AMBULATORY_CARE_PROVIDER_SITE_OTHER): Payer: Medicare Other | Admitting: *Deleted

## 2011-04-06 DIAGNOSIS — I4891 Unspecified atrial fibrillation: Secondary | ICD-10-CM

## 2011-04-06 LAB — POCT INR: INR: 1.6

## 2011-04-10 ENCOUNTER — Other Ambulatory Visit: Payer: Self-pay | Admitting: Family Medicine

## 2011-04-13 ENCOUNTER — Encounter: Payer: Self-pay | Admitting: Cardiology

## 2011-04-13 ENCOUNTER — Ambulatory Visit (INDEPENDENT_AMBULATORY_CARE_PROVIDER_SITE_OTHER): Payer: Medicare Other | Admitting: Cardiology

## 2011-04-13 DIAGNOSIS — E785 Hyperlipidemia, unspecified: Secondary | ICD-10-CM

## 2011-04-13 DIAGNOSIS — I10 Essential (primary) hypertension: Secondary | ICD-10-CM

## 2011-04-13 DIAGNOSIS — I4891 Unspecified atrial fibrillation: Secondary | ICD-10-CM

## 2011-04-13 DIAGNOSIS — I714 Abdominal aortic aneurysm, without rupture: Secondary | ICD-10-CM

## 2011-04-13 DIAGNOSIS — I2510 Atherosclerotic heart disease of native coronary artery without angina pectoris: Secondary | ICD-10-CM

## 2011-04-13 DIAGNOSIS — D649 Anemia, unspecified: Secondary | ICD-10-CM

## 2011-04-13 LAB — CBC WITH DIFFERENTIAL/PLATELET
Basophils Absolute: 0 10*3/uL (ref 0.0–0.1)
Eosinophils Relative: 1.2 % (ref 0.0–5.0)
Lymphocytes Relative: 66.7 % — ABNORMAL HIGH (ref 12.0–46.0)
Lymphs Abs: 7.2 10*3/uL — ABNORMAL HIGH (ref 0.7–4.0)
Monocytes Relative: 3.8 % (ref 3.0–12.0)
Neutrophils Relative %: 28 % — ABNORMAL LOW (ref 43.0–77.0)
Platelets: 86 10*3/uL — ABNORMAL LOW (ref 150.0–400.0)
RDW: 13.5 % (ref 11.5–14.6)
WBC: 10.7 10*3/uL — ABNORMAL HIGH (ref 4.5–10.5)

## 2011-04-13 LAB — HEPATIC FUNCTION PANEL
ALT: 20 U/L (ref 0–53)
AST: 23 U/L (ref 0–37)
Alkaline Phosphatase: 65 U/L (ref 39–117)
Bilirubin, Direct: 0.2 mg/dL (ref 0.0–0.3)
Total Protein: 7.1 g/dL (ref 6.0–8.3)

## 2011-04-13 LAB — LIPID PANEL
Cholesterol: 117 mg/dL (ref 0–200)
Triglycerides: 86 mg/dL (ref 0.0–149.0)

## 2011-04-13 LAB — BASIC METABOLIC PANEL
CO2: 31 mEq/L (ref 19–32)
Calcium: 9.2 mg/dL (ref 8.4–10.5)
Glucose, Bld: 86 mg/dL (ref 70–99)
Potassium: 4.5 mEq/L (ref 3.5–5.1)
Sodium: 142 mEq/L (ref 135–145)
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
## 2011-04-13 NOTE — Assessment & Plan Note (Signed)
Seems to be doing well from a cardiac standpoint.  No chest pain.  Has stopped smoking.  No unstable symptoms.

## 2011-04-13 NOTE — Assessment & Plan Note (Signed)
Being followed at [facility].

## 2011-04-13 NOTE — Assessment & Plan Note (Signed)
Controlled at the present time.  

## 2011-04-13 NOTE — Assessment & Plan Note (Signed)
Last LDL was at target.   

## 2011-04-13 NOTE — Patient Instructions (Signed)
Your physician recommends that you schedule a follow-up appointment in: 6 MONTHS 

## 2011-04-13 NOTE — Assessment & Plan Note (Signed)
Remains on wafarin.

## 2011-04-19 ENCOUNTER — Telehealth: Payer: Self-pay | Admitting: Cardiology

## 2011-04-19 NOTE — Telephone Encounter (Signed)
They are aware of lab results. Will probably have Dr. Scotty Court follow up on the low platelet count. Will discuss with Dr. Riley Kill.

## 2011-04-19 NOTE — Telephone Encounter (Signed)
Pt was given some test results on the machine and she is not sure what she is to do anything or not

## 2011-04-21 NOTE — Telephone Encounter (Signed)
Left a message for him.  Told him it had not changed in a couple of years.  TS

## 2011-04-28 ENCOUNTER — Ambulatory Visit (INDEPENDENT_AMBULATORY_CARE_PROVIDER_SITE_OTHER): Payer: Medicare Other | Admitting: *Deleted

## 2011-04-28 DIAGNOSIS — I4891 Unspecified atrial fibrillation: Secondary | ICD-10-CM

## 2011-05-09 ENCOUNTER — Other Ambulatory Visit: Payer: Self-pay | Admitting: Family Medicine

## 2011-05-10 ENCOUNTER — Telehealth: Payer: Self-pay | Admitting: Family Medicine

## 2011-05-10 MED ORDER — TEMAZEPAM 30 MG PO CAPS: 30.0000 mg | ORAL_CAPSULE | Freq: Every evening | ORAL | Status: DC | PRN

## 2011-05-10 NOTE — Telephone Encounter (Signed)
Ok per Dr. Scotty Court to call in temazepam 30 mg x 5 rf.

## 2011-05-10 NOTE — Telephone Encounter (Signed)
Pt is scheduled for an appt on 05/18/11 and needs a refill on his meds he is out of temazepam (RESTORIL) 30 MG capsule. Please contact pt.

## 2011-05-17 ENCOUNTER — Ambulatory Visit (HOSPITAL_COMMUNITY)
Admission: RE | Admit: 2011-05-17 | Discharge: 2011-05-17 | Disposition: A | Discharge status: Home / Self Care | Payer: Medicare Other | Source: Ambulatory Visit | Attending: Family Medicine | Admitting: Family Medicine

## 2011-05-17 ENCOUNTER — Ambulatory Visit (INDEPENDENT_AMBULATORY_CARE_PROVIDER_SITE_OTHER): Payer: Medicare Other | Admitting: Family Medicine

## 2011-05-17 ENCOUNTER — Encounter: Payer: Self-pay | Admitting: Family Medicine

## 2011-05-17 VITALS — BP 116/60 | HR 81 | Temp 97.8°F | Wt 164.0 lb

## 2011-05-17 DIAGNOSIS — R0789 Other chest pain: Secondary | ICD-10-CM | POA: Insufficient documentation

## 2011-05-17 DIAGNOSIS — R05 Cough: Secondary | ICD-10-CM

## 2011-05-17 DIAGNOSIS — J438 Other emphysema: Secondary | ICD-10-CM | POA: Insufficient documentation

## 2011-05-17 DIAGNOSIS — Z951 Presence of aortocoronary bypass graft: Secondary | ICD-10-CM | POA: Insufficient documentation

## 2011-05-17 DIAGNOSIS — R059 Cough, unspecified: Secondary | ICD-10-CM | POA: Insufficient documentation

## 2011-05-17 DIAGNOSIS — J449 Chronic obstructive pulmonary disease, unspecified: Secondary | ICD-10-CM

## 2011-05-17 NOTE — Progress Notes (Signed)
  Subjective:    Patient ID: [name] [name], male    DOB: 05-Dec-1925, 77 y.o.   MRN: [id number]  HPI Here with concerns about his lungs. He was a lifelong smoker until he quit 3 years ago. He had a CXR last December that was remarkable only for some COPD changes. Now for 2 months he describes a fullness or pressure sensation in the right lower chest area, and he is very worried about possible cancer. He has a chronic nonproductive cough. No recent chest pains or fever.    Review of Systems  Constitutional: Negative.   Respiratory: Positive for cough, chest tightness and shortness of breath. Negative for apnea, choking, wheezing and stridor.   Cardiovascular: Negative.        Objective:   Physical Exam  Constitutional: He appears well-developed and well-nourished.  Neck: Neck supple. No thyromegaly present.  Cardiovascular: Normal rate, regular rhythm, normal heart sounds and intact distal pulses.   Pulmonary/Chest: Effort normal. He has no rales. He exhibits no tenderness.       Scattered rhonchi and wheezes   Lymphadenopathy:    He has no cervical adenopathy.          Assessment & Plan:  Get another CXR today. He has known COPD.

## 2011-05-18 ENCOUNTER — Ambulatory Visit: Payer: Medicare Other | Admitting: Family Medicine

## 2011-05-18 ENCOUNTER — Telehealth: Payer: Self-pay | Admitting: Family Medicine

## 2011-05-18 NOTE — Telephone Encounter (Signed)
Left voice message with results.

## 2011-05-18 NOTE — Telephone Encounter (Signed)
Message copied by Baldemar Friday on Wed May 18, 2011  5:24 PM ------      Message from: Gershon Crane A      Created: Wed May 18, 2011  1:09 PM       Shows stable emphysema only

## 2011-05-26 ENCOUNTER — Ambulatory Visit (INDEPENDENT_AMBULATORY_CARE_PROVIDER_SITE_OTHER): Payer: Medicare Other | Admitting: *Deleted

## 2011-05-26 DIAGNOSIS — I4891 Unspecified atrial fibrillation: Secondary | ICD-10-CM

## 2011-05-26 LAB — POCT INR: INR: 2.1

## 2011-06-23 ENCOUNTER — Ambulatory Visit (INDEPENDENT_AMBULATORY_CARE_PROVIDER_SITE_OTHER): Payer: Medicare Other | Admitting: *Deleted

## 2011-06-23 DIAGNOSIS — I4891 Unspecified atrial fibrillation: Secondary | ICD-10-CM

## 2011-06-23 LAB — POCT INR: INR: 2.2

## 2011-07-01 ENCOUNTER — Encounter: Payer: Medicare Other | Admitting: Internal Medicine

## 2011-07-12 ENCOUNTER — Ambulatory Visit (INDEPENDENT_AMBULATORY_CARE_PROVIDER_SITE_OTHER): Payer: Medicare Other

## 2011-07-12 DIAGNOSIS — Z23 Encounter for immunization: Secondary | ICD-10-CM

## 2011-07-21 ENCOUNTER — Encounter: Payer: Self-pay | Admitting: Internal Medicine

## 2011-07-21 ENCOUNTER — Ambulatory Visit (INDEPENDENT_AMBULATORY_CARE_PROVIDER_SITE_OTHER): Payer: Medicare Other | Admitting: Internal Medicine

## 2011-07-21 DIAGNOSIS — I4891 Unspecified atrial fibrillation: Secondary | ICD-10-CM

## 2011-07-21 DIAGNOSIS — I441 Atrioventricular block, second degree: Secondary | ICD-10-CM

## 2011-07-21 LAB — PACEMAKER DEVICE OBSERVATION
ATRIAL PACING PM: 23
BAMS-0001: 150 {beats}/min
BAMS-0003: 70 {beats}/min
BATTERY VOLTAGE: 2.9478 V
RV LEAD AMPLITUDE: 5.8 mv
VENTRICULAR PACING PM: 38

## 2011-07-21 NOTE — Patient Instructions (Signed)
Your physician wants you to follow-up in: 12 months with Dr Jacquiline Doe will receive a reminder letter in the mail two months in advance. If you don't receive a letter, please call our office to schedule the follow-up appointment.   Remote monitoring is used to monitor your Pacemaker of ICD from home. This monitoring reduces the number of office visits required to check your device to one time per year. It allows [place] to keep an eye on the functioning of your device to ensure it is working properly. You are scheduled for a device check from home on  10/20/2011 You may send your transmission at any time that day. If you have a wireless device, the transmission will be sent automatically. After your physician reviews your transmission, you will receive a postcard with your next transmission date.

## 2011-07-21 NOTE — Progress Notes (Signed)
The patient presents today for routine electrophysiology followup.  Since last being seen in our clinic, the patient reports doing reasonably well.  He remains quite active despite his age. Today, he denies symptoms of palpitations, chest pain, shortness of breath,  lower extremity edema, dizziness, presyncope, syncope, or neurologic sequela.  The patient feels that he is tolerating medications without difficulties and is otherwise without complaint today.   Past Medical History  Diagnosis Date  . CAD (coronary artery disease)   . Hypertension   . Vitamin D deficiency   . Chronic back pain   . Thyroid disease   . Anemia   . ED (erectile dysfunction)   . Arthritis   . RLS (restless legs syndrome)   . GERD (gastroesophageal reflux disease)   . COPD (chronic obstructive pulmonary disease)   . Hyperlipidemia   . Atrial fibrillation   . Second degree Mobitz II AV block     s/p PPM   Past Surgical History  Procedure Date  . Coronary artery bypass graft   . Cataract extraction   . Pacemaker insertion     Current Outpatient Prescriptions  Medication Sig Dispense Refill  . albuterol (VENTOLIN HFA) 108 (90 BASE) MCG/ACT inhaler Inhale 2 puffs into the lungs every 6 (six) hours as needed.  1 Inhaler  11  . aspirin 81 MG tablet Take 81 mg by mouth daily.        . clotrimazole-betamethasone (LOTRISONE) cream Apply 1 application topically as needed.       Marland Kitchen EPINEPHrine (EPIPEN JR) 0.15 MG/0.3ML injection Inject 0.15 mg into the muscle as needed.        . fexofenadine (ALLEGRA) 180 MG tablet Take 180 mg by mouth daily.        . isosorbide mononitrate (IMDUR) 30 MG 24 hr tablet Take 30 mg by mouth daily. 1/2 tab qd       . metoprolol succinate (TOPROL XL) 25 MG 24 hr tablet Take 1 tablet (25 mg total) by mouth daily.  30 tablet  11  . Multiple Vitamin (MULTIVITAMIN) tablet Take 1 tablet by mouth daily.       Marland Kitchen omeprazole (PRILOSEC) 40 MG capsule Take 40 mg by mouth daily.       Marland Kitchen rOPINIRole  (REQUIP) 4 MG tablet Take 4 mg by mouth at bedtime.        . simvastatin (ZOCOR) 10 MG tablet Take 10 mg by mouth at bedtime.        . temazepam (RESTORIL) 30 MG capsule Take 1 capsule (30 mg total) by mouth at bedtime as needed for sleep.  30 capsule  5  . warfarin (COUMADIN) 5 MG tablet Take by mouth as directed.          Allergies  Allergen Reactions  . Amoxicillin     REACTION: unspecified  . Penicillins     History   Social History  . Marital Status: Widowed    Spouse Name: N/A    Number of Children: N/A  . Years of Education: N/A   Occupational History  . Not on file.   Social History Main Topics  . Smoking status: Former Games developer  . Smokeless tobacco: Never Used  . Alcohol Use: Yes  . Drug Use: No  . Sexually Active: Not on file   Other Topics Concern  . Not on file   Social History Narrative  . No narrative on file    Family History  Problem Relation Age of Onset  .  Diabetes Sister     Physical Exam: Filed Vitals:   07/21/11 1123  BP: 114/66  Pulse: 62  Height: 5\' 9"  (1.753 m)  Weight: 169 lb 1.9 oz (76.712 kg)    GEN- The patient is well appearing, alert and oriented x 3 today.   Head- normocephalic, atraumatic Eyes-  Sclera clear, conjunctiva pink Ears- hearing intact Oropharynx- clear Neck- supple, no JVP Lymph- no cervical lymphadenopathy Lungs- Clear to ausculation bilaterally, normal work of breathing Chest- pacemaker pocket is well healed Heart- Regular rate and rhythm, no murmurs, rubs or gallops, PMI not laterally displaced GI- soft, NT, ND, + BS Extremities- no clubbing, cyanosis, trace edema  Pacemaker interrogation- reviewed in detail today,  See PACEART report  Assessment and Plan:

## 2011-07-21 NOTE — Assessment & Plan Note (Signed)
Normal pacemaker function, though RV threshold is chronically elevated with low R waves (stable) See Pace Art report No changes today

## 2011-07-21 NOTE — Assessment & Plan Note (Signed)
Continue coumadin long term Occasional fast heart rates on PPM I have suggested increasing toprol, however he would like to make no changes today.

## 2011-08-04 ENCOUNTER — Ambulatory Visit (INDEPENDENT_AMBULATORY_CARE_PROVIDER_SITE_OTHER): Payer: Medicare Other | Admitting: *Deleted

## 2011-08-04 DIAGNOSIS — I4891 Unspecified atrial fibrillation: Secondary | ICD-10-CM

## 2011-09-05 ENCOUNTER — Encounter: Payer: Self-pay | Admitting: Internal Medicine

## 2011-09-08 ENCOUNTER — Telehealth: Payer: Self-pay | Admitting: Cardiology

## 2011-09-08 ENCOUNTER — Ambulatory Visit: Payer: Medicare Other | Admitting: Internal Medicine

## 2011-09-08 NOTE — Telephone Encounter (Signed)
Pt needs a print of all OV and test done for 2012

## 2011-09-08 NOTE — Telephone Encounter (Signed)
I will forward this message to medical records to follow-up with the pt.

## 2011-09-10 ENCOUNTER — Encounter: Payer: Self-pay | Admitting: Internal Medicine

## 2011-09-10 DIAGNOSIS — Z0000 Encounter for general adult medical examination without abnormal findings: Secondary | ICD-10-CM | POA: Insufficient documentation

## 2011-09-14 ENCOUNTER — Encounter (INDEPENDENT_AMBULATORY_CARE_PROVIDER_SITE_OTHER): Payer: Medicare Other | Admitting: *Deleted

## 2011-09-14 DIAGNOSIS — I714 Abdominal aortic aneurysm, without rupture: Secondary | ICD-10-CM

## 2011-09-15 ENCOUNTER — Ambulatory Visit (INDEPENDENT_AMBULATORY_CARE_PROVIDER_SITE_OTHER): Payer: Medicare Other | Admitting: Internal Medicine

## 2011-09-15 ENCOUNTER — Encounter: Payer: Self-pay | Admitting: Internal Medicine

## 2011-09-15 ENCOUNTER — Ambulatory Visit (INDEPENDENT_AMBULATORY_CARE_PROVIDER_SITE_OTHER): Payer: Medicare Other | Admitting: *Deleted

## 2011-09-15 ENCOUNTER — Other Ambulatory Visit: Payer: Self-pay | Admitting: Family Medicine

## 2011-09-15 VITALS — BP 122/72 | HR 65 | Temp 97.1°F | Ht 69.0 in | Wt 168.0 lb

## 2011-09-15 DIAGNOSIS — Z7901 Long term (current) use of anticoagulants: Secondary | ICD-10-CM

## 2011-09-15 DIAGNOSIS — E785 Hyperlipidemia, unspecified: Secondary | ICD-10-CM

## 2011-09-15 DIAGNOSIS — J449 Chronic obstructive pulmonary disease, unspecified: Secondary | ICD-10-CM

## 2011-09-15 DIAGNOSIS — E039 Hypothyroidism, unspecified: Secondary | ICD-10-CM

## 2011-09-15 DIAGNOSIS — J45901 Unspecified asthma with (acute) exacerbation: Secondary | ICD-10-CM | POA: Insufficient documentation

## 2011-09-15 DIAGNOSIS — I10 Essential (primary) hypertension: Secondary | ICD-10-CM

## 2011-09-15 DIAGNOSIS — D696 Thrombocytopenia, unspecified: Secondary | ICD-10-CM

## 2011-09-15 DIAGNOSIS — I4891 Unspecified atrial fibrillation: Secondary | ICD-10-CM

## 2011-09-15 HISTORY — DX: Thrombocytopenia, unspecified: D69.6

## 2011-09-15 HISTORY — DX: Long term (current) use of anticoagulants: Z79.01

## 2011-09-15 NOTE — Patient Instructions (Addendum)
Continue all other medications as before Please return in 6 months, or sooner if needed 

## 2011-09-16 ENCOUNTER — Other Ambulatory Visit: Payer: Self-pay | Admitting: Internal Medicine

## 2011-09-18 ENCOUNTER — Encounter: Payer: Self-pay | Admitting: Internal Medicine

## 2011-09-18 NOTE — Assessment & Plan Note (Signed)
stable overall by hx and exam, most recent data reviewed with pt, and pt to continue medical treatment as before  Lab Results  Component Value Date   LDLCALC 49 04/13/2011

## 2011-09-18 NOTE — Progress Notes (Signed)
Subjective:    Patient ID: [name] [name], male    DOB: 1926-03-27, 78 y.o.   MRN: [id number]  HPI  Here to f/u; overall doing ok,  Pt denies chest pain, increased sob or doe, wheezing, orthopnea, PND, increased LE swelling, palpitations, dizziness or syncope.  Pt denies new neurological symptoms such as new headache, or facial or extremity weakness or numbness   Pt denies polydipsia, polyuria  Pt states overall good compliance with meds, trying to follow lower cholesterol diet, wt overall stable but little exercise however.  Denies hyper or hypo thyroid symptoms such as voice, skin or hair change.  Pt denies fever, wt loss, night sweats, loss of appetite, or other constitutional symptoms  Denies worsening depressive symptoms, suicidal ideation, or panic. Past Medical History  Diagnosis Date  . CAD (coronary artery disease)   . Hypertension   . Vitamin D deficiency   . Chronic back pain   . Thyroid disease   . Anemia   . ED (erectile dysfunction)   . Arthritis   . RLS (restless legs syndrome)   . GERD (gastroesophageal reflux disease)   . COPD (chronic obstructive pulmonary disease)   . Hyperlipidemia   . Atrial fibrillation   . Second degree Mobitz II AV block     s/p PPM  . Asthma 09/15/2011  . Long term current use of anticoagulant 09/15/2011  . Thrombocytopenia 09/15/2011   Past Surgical History  Procedure Date  . Coronary artery bypass graft   . Cataract extraction   . Pacemaker insertion     reports that he has quit smoking. He has never used smokeless tobacco. He reports that he drinks alcohol. He reports that he does not use illicit drugs. family history includes Diabetes in his sister. Allergies  Allergen Reactions  . Amoxicillin     REACTION: unspecified  . Penicillins    Current Outpatient Prescriptions on File Prior to Visit  Medication Sig Dispense Refill  . albuterol (VENTOLIN HFA) 108 (90 BASE) MCG/ACT inhaler Inhale 2 puffs into the lungs every 6 (six) hours as  needed.  1 Inhaler  11  . aspirin 81 MG tablet Take 81 mg by mouth daily.        . clotrimazole-betamethasone (LOTRISONE) cream Apply 1 application topically as needed.       Marland Kitchen EPINEPHrine (EPIPEN JR) 0.15 MG/0.3ML injection Inject 0.15 mg into the muscle as needed.        . fexofenadine (ALLEGRA) 180 MG tablet Take 180 mg by mouth daily.        . isosorbide mononitrate (IMDUR) 30 MG 24 hr tablet Take 30 mg by mouth daily. 1/2 tab qd       . metoprolol succinate (TOPROL XL) 25 MG 24 hr tablet Take 1 tablet (25 mg total) by mouth daily.  30 tablet  11  . Multiple Vitamin (MULTIVITAMIN) tablet Take 1 tablet by mouth daily.       Marland Kitchen omeprazole (PRILOSEC) 40 MG capsule Take 40 mg by mouth daily.       Marland Kitchen rOPINIRole (REQUIP) 4 MG tablet Take 4 mg by mouth at bedtime.        . temazepam (RESTORIL) 30 MG capsule Take 1 capsule (30 mg total) by mouth at bedtime as needed for sleep.  30 capsule  5  . warfarin (COUMADIN) 5 MG tablet Take by mouth as directed.         Review of Systems Review of Systems  Constitutional: Negative for diaphoresis and  unexpected weight change.  HENT: Negative for drooling and tinnitus.   Eyes: Negative for photophobia and visual disturbance.  Respiratory: Negative for choking and stridor.   Gastrointestinal: Negative for vomiting and blood in stool.  Genitourinary: Negative for hematuria and decreased urine volume.  Musculoskeletal: Negative for gait problem.    Objective:   Physical Exam BP 122/72  Pulse 65  Temp(Src) 97.1 F (36.2 C) (Oral)  Ht 5\' 9"  (1.753 m)  Wt 168 lb (76.204 kg)  BMI 24.81 kg/m2  SpO2 96% Physical Exam  VS noted Constitutional: Pt appears well-developed and well-nourished.  HENT: Head: Normocephalic.  Right Ear: External ear normal.  Left Ear: External ear normal.  Eyes: Conjunctivae and EOM are normal. Pupils are equal, round, and reactive to light.  Neck: Normal range of motion. Neck supple.  Cardiovascular: Normal rate and regular  rhythm.   Pulmonary/Chest: Effort normal and breath sounds normal.  Abd:  Soft, NT, non-distended, + BS Neurological: Pt is alert. No cranial nerve deficit.  Skin: Skin is warm. No erythema.  Psychiatric: Pt behavior is normal. Thought content normal.     Assessment & Plan:

## 2011-09-18 NOTE — Assessment & Plan Note (Signed)
stable overall by hx and exam, most recent data reviewed with pt, and pt to continue medical treatment as before  Lab Results  Component Value Date   TSH 1.34 11/25/2009

## 2011-09-18 NOTE — Assessment & Plan Note (Signed)
stable overall by hx and exam, most recent data reviewed with pt, and pt to continue medical treatment as before  SpO2 Readings from Last 3 Encounters:  09/15/11 96%  09/07/10 96%  07/14/10 98%

## 2011-09-18 NOTE — Assessment & Plan Note (Signed)
stable overall by hx and exam, most recent data reviewed with pt, and pt to continue medical treatment as before  BP Readings from Last 3 Encounters:  09/15/11 122/72  07/21/11 114/66  05/17/11 116/60

## 2011-09-21 ENCOUNTER — Other Ambulatory Visit: Payer: Self-pay | Admitting: Family Medicine

## 2011-09-21 NOTE — Procedures (Unsigned)
DUPLEX ULTRASOUND OF ABDOMINAL AORTA  INDICATION:  Followup AAA.  HISTORY: Diabetes:  No Cardiac:  MI 74s, CABG, pacemaker Hypertension:  No Smoking:  Previous Connective Tissue Disorder: Family History:  No Previous Surgery:  No  DUPLEX EXAM:         AP (cm)                   TRANSVERSE (cm) Proximal             2.03 cm                   1.97 cm Mid                  4.47 cm                   4.63 cm Distal               3.80 cm                   3.67 cm Right Iliac          1.50 cm                   1.50 cm Left Iliac           1.59 cm                   1.50 cm  PREVIOUS:  Date: 02/25/2011  AP:  4.1  TRANSVERSE:  4.6  IMPRESSION: 1. Essentially stable abdominal aortic aneurysm measuring 4.47 x 4.63     cm. 2. Intraluminal thrombus noted. 3. Stable bilateral common iliac artery measurements.  ___________________________________________ [name], M.D.  EM/MEDQ  D:  09/15/2011  T:  09/15/2011  Job:  [id number]

## 2011-09-22 ENCOUNTER — Other Ambulatory Visit: Payer: Self-pay | Admitting: *Deleted

## 2011-09-22 ENCOUNTER — Encounter: Payer: Self-pay | Admitting: Vascular Surgery

## 2011-09-22 DIAGNOSIS — I714 Abdominal aortic aneurysm, without rupture: Secondary | ICD-10-CM

## 2011-10-03 ENCOUNTER — Other Ambulatory Visit: Payer: Self-pay | Admitting: Family Medicine

## 2011-10-03 ENCOUNTER — Telehealth: Payer: Self-pay | Admitting: Internal Medicine

## 2011-10-03 MED ORDER — PANTOPRAZOLE SODIUM 40 MG PO TBEC: 40.0000 mg | DELAYED_RELEASE_TABLET | Freq: Every day | ORAL | Status: DC

## 2011-10-03 NOTE — Telephone Encounter (Signed)
The pt called and is requesting a new rx for Protonix.  The prilosec is no longer a med that his insurance covers.  Thanks!

## 2011-10-03 NOTE — Telephone Encounter (Signed)
Called and spoke with daughter and told them the med had been called in.

## 2011-10-03 NOTE — Telephone Encounter (Signed)
Done per emr 

## 2011-10-04 NOTE — Telephone Encounter (Signed)
Done escript 

## 2011-10-10 ENCOUNTER — Other Ambulatory Visit: Payer: Self-pay | Admitting: Family Medicine

## 2011-10-14 ENCOUNTER — Telehealth: Payer: Self-pay

## 2011-10-14 MED ORDER — CLONAZEPAM 0.5 MG PO TABS: ORAL_TABLET | ORAL | Status: DC

## 2011-10-14 NOTE — Telephone Encounter (Signed)
Faxed hardcopy to pharmacy and informed the patient 

## 2011-10-14 NOTE — Telephone Encounter (Signed)
I believe that clonazepam is covered by his insurance, as several other pt's have also had this issue  OK to change to clonazepam - Done hardcopy to robin

## 2011-10-14 NOTE — Telephone Encounter (Signed)
The patient called to inform Temazepam is no longer on his formulary list. Please advise as patient was given #30 the end of Jan.

## 2011-10-20 ENCOUNTER — Ambulatory Visit (INDEPENDENT_AMBULATORY_CARE_PROVIDER_SITE_OTHER): Payer: Medicare Other | Admitting: *Deleted

## 2011-10-20 ENCOUNTER — Encounter: Payer: Self-pay | Admitting: Internal Medicine

## 2011-10-20 DIAGNOSIS — I441 Atrioventricular block, second degree: Secondary | ICD-10-CM

## 2011-10-25 ENCOUNTER — Encounter: Payer: Self-pay | Admitting: *Deleted

## 2011-10-27 ENCOUNTER — Ambulatory Visit (INDEPENDENT_AMBULATORY_CARE_PROVIDER_SITE_OTHER): Payer: Medicare Other | Admitting: Pharmacist

## 2011-10-27 DIAGNOSIS — I4891 Unspecified atrial fibrillation: Secondary | ICD-10-CM

## 2011-10-27 LAB — POCT INR: INR: 3.6

## 2011-10-28 NOTE — Progress Notes (Signed)
PPM remote 

## 2011-11-17 ENCOUNTER — Ambulatory Visit (INDEPENDENT_AMBULATORY_CARE_PROVIDER_SITE_OTHER): Payer: Medicare Other | Admitting: *Deleted

## 2011-11-17 DIAGNOSIS — I4891 Unspecified atrial fibrillation: Secondary | ICD-10-CM

## 2011-12-15 ENCOUNTER — Ambulatory Visit (INDEPENDENT_AMBULATORY_CARE_PROVIDER_SITE_OTHER): Payer: Medicare Other | Admitting: Pharmacist

## 2011-12-15 DIAGNOSIS — I4891 Unspecified atrial fibrillation: Secondary | ICD-10-CM

## 2011-12-15 LAB — POCT INR: INR: 2

## 2012-01-05 ENCOUNTER — Ambulatory Visit (INDEPENDENT_AMBULATORY_CARE_PROVIDER_SITE_OTHER): Payer: Medicare Other

## 2012-01-05 ENCOUNTER — Encounter: Payer: Self-pay | Admitting: Cardiology

## 2012-01-05 ENCOUNTER — Ambulatory Visit (INDEPENDENT_AMBULATORY_CARE_PROVIDER_SITE_OTHER): Payer: Medicare Other | Admitting: Cardiology

## 2012-01-05 VITALS — BP 120/70 | HR 65 | Resp 18 | Ht 69.0 in | Wt 170.0 lb

## 2012-01-05 DIAGNOSIS — I4891 Unspecified atrial fibrillation: Secondary | ICD-10-CM

## 2012-01-05 DIAGNOSIS — I10 Essential (primary) hypertension: Secondary | ICD-10-CM

## 2012-01-05 DIAGNOSIS — I25810 Atherosclerosis of coronary artery bypass graft(s) without angina pectoris: Secondary | ICD-10-CM

## 2012-01-05 DIAGNOSIS — I714 Abdominal aortic aneurysm, without rupture, unspecified: Secondary | ICD-10-CM

## 2012-01-05 DIAGNOSIS — E785 Hyperlipidemia, unspecified: Secondary | ICD-10-CM

## 2012-01-05 DIAGNOSIS — R0989 Other specified symptoms and signs involving the circulatory and respiratory systems: Secondary | ICD-10-CM

## 2012-01-05 DIAGNOSIS — I2510 Atherosclerotic heart disease of native coronary artery without angina pectoris: Secondary | ICD-10-CM

## 2012-01-05 LAB — POCT INR: INR: 3.1

## 2012-01-05 NOTE — Patient Instructions (Addendum)
Your physician recommends that you have lab work today: BMP and CBC  Your physician has requested that you have a carotid duplex. This test is an ultrasound of the carotid arteries in your neck. It looks at blood flow through these arteries that supply the brain with blood. Allow one hour for this exam. There are no restrictions or special instructions.  Your physician wants you to follow-up in: 6 MONTHS.  You will receive a reminder letter in the mail two months in advance. If you don't receive a letter, please call our office to schedule the follow-up appointment.  Your physician recommends that you continue on your current medications as directed. Please refer to the Current Medication list given to you today.

## 2012-01-06 DIAGNOSIS — R0989 Other specified symptoms and signs involving the circulatory and respiratory systems: Secondary | ICD-10-CM | POA: Insufficient documentation

## 2012-01-06 LAB — CBC WITH DIFFERENTIAL/PLATELET
Basophils Relative: 0.4 % (ref 0.0–3.0)
Eosinophils Relative: 1.7 % (ref 0.0–5.0)
Hemoglobin: 13.9 g/dL (ref 13.0–17.0)
Lymphocytes Relative: 68 % — ABNORMAL HIGH (ref 12.0–46.0)
MCHC: 32.5 g/dL (ref 30.0–36.0)
Monocytes Relative: 3.3 % (ref 3.0–12.0)
Neutro Abs: 3.7 10*3/uL (ref 1.4–7.7)
RBC: 4.58 Mil/uL (ref 4.22–5.81)
WBC: 14.1 10*3/uL — ABNORMAL HIGH (ref 4.5–10.5)

## 2012-01-06 LAB — BASIC METABOLIC PANEL
CO2: 30 mEq/L (ref 19–32)
Calcium: 9.2 mg/dL (ref 8.4–10.5)
Creatinine, Ser: 1 mg/dL (ref 0.4–1.5)

## 2012-01-06 NOTE — Assessment & Plan Note (Signed)
See last cath.  No recurrent angina.  Fortunately, has stopped smoking.

## 2012-01-06 NOTE — Progress Notes (Signed)
HPI:  He claims he is doing well.  Quite smoking nearly three years ago.  No specific complaints today.  No syncope or presyncope.  Last cath was 2006  CONCLUSION:  1. Prior coronary bypass surgery with new onset of high-grade saphenous  vein graft disease with successful percutaneous intervention with a non-  flow drug-eluting platform.  2. Continued patency of the internal mammary to the left anterior  descending artery.  3. Slightly hazy irregular disease involving the large circumflex system  which is not previously bypassed.  4. Minimal reduction in overall left ventricular function.  5. Known abdominal aortic aneurysm.   Current Outpatient Prescriptions  Medication Sig Dispense Refill  . albuterol (VENTOLIN HFA) 108 (90 BASE) MCG/ACT inhaler Inhale 2 puffs into the lungs every 6 (six) hours as needed.  1 Inhaler  11  . aspirin 81 MG tablet Take 81 mg by mouth daily.        . clonazePAM (KLONOPIN) 0.5 MG tablet 1-2 tabs by mouth at bedtime for sleep or nerves as needed  60 tablet  3  . clotrimazole-betamethasone (LOTRISONE) cream Apply 1 application topically as needed.       Marland Kitchen EPINEPHrine (EPIPEN JR) 0.15 MG/0.3ML injection Inject 0.15 mg into the muscle as needed.        . fexofenadine (ALLEGRA) 180 MG tablet Take 180 mg by mouth daily.        . isosorbide mononitrate (IMDUR) 30 MG 24 hr tablet take 1/2 tablet by mouth once daily  15 tablet  11  . metoprolol succinate (TOPROL XL) 25 MG 24 hr tablet Take 1 tablet (25 mg total) by mouth daily.  30 tablet  11  . Multiple Vitamin (MULTIVITAMIN) tablet Take 1 tablet by mouth daily.       Marland Kitchen omeprazole (PRILOSEC) 40 MG capsule take 1 capsule by mouth every morning and 1 capsule by mouth every evening  180 capsule  3  . pantoprazole (PROTONIX) 40 MG tablet Take 1 tablet (40 mg total) by mouth daily.  90 tablet  3  . rOPINIRole (REQUIP) 4 MG tablet take 1 tablet by mouth 1 HOUR BEFORE BEDTIME AND 1/2 TABLET EACH MORNING.  45 tablet  11    . simvastatin (ZOCOR) 10 MG tablet take 1 tablet by mouth at bedtime  90 tablet  3  . warfarin (COUMADIN) 5 MG tablet USE AS DIRECTED BY ANTICOAGULATION CLINIC. 1 TABLET BY MOUTH EVERY EVENING EXCEPT TAKE 1 AND 1/2 TABS ON MONDAY, WEDNESDAY, AND FRIDAY.  40 tablet  11    Allergies  Allergen Reactions  . Amoxicillin     REACTION: unspecified  . Penicillins     Past Medical History  Diagnosis Date  . CAD (coronary artery disease)   . Hypertension   . Vitamin d deficiency   . Chronic back pain   . Thyroid disease   . Anemia   . ED (erectile dysfunction)   . Arthritis   . RLS (restless legs syndrome)   . GERD (gastroesophageal reflux disease)   . COPD (chronic obstructive pulmonary disease)   . Hyperlipidemia   . Atrial fibrillation   . Second degree Mobitz II AV block     s/p PPM  . Asthma 09/15/2011  . Long term current use of anticoagulant 09/15/2011  . Thrombocytopenia 09/15/2011    Past Surgical History  Procedure Date  . Coronary artery bypass graft   . Cataract extraction   . Pacemaker insertion     Family History  Problem Relation Age of Onset  . Diabetes Sister     History   Social History  . Marital Status: Widowed    Spouse Name: N/A    Number of Children: N/A  . Years of Education: N/A   Occupational History  . Not on file.   Social History Main Topics  . Smoking status: Former Games developer  . Smokeless tobacco: Never Used  . Alcohol Use: Yes  . Drug Use: No  . Sexually Active: Not on file   Other Topics Concern  . Not on file   Social History Narrative  . No narrative on file    ROS: Please see the HPI.  All other systems reviewed and negative.  PHYSICAL EXAM:  BP 120/70  Pulse 65  Resp 18  Ht 5\' 9"  (1.753 m)  Wt 170 lb (77.111 kg)  BMI 25.10 kg/m2  General: older gentleman in no distress Head:  Normocephalic and atraumatic. Neck: no JVD.  Fairly prominent R carotid bruit.   Lungs: Prolonged expiration with some decreased BS.    Heart: Normal S1 and S2. Minimal SEM.   Abdomen:  Normal bowel sounds; soft; non tender; no organomegaly Extremities: No clubbing or cyanosis. No edema. Neurologic: Alert and oriented x 3.  EKG:   AV pacing with bigeminal PVCs.  No acute changes.    ASSESSMENT AND PLAN:

## 2012-01-06 NOTE — Assessment & Plan Note (Signed)
Controlled.  

## 2012-01-06 NOTE — Assessment & Plan Note (Signed)
Patient is followed at [facility] and July follow up is schedule there for AAA.

## 2012-01-06 NOTE — Assessment & Plan Note (Signed)
Last LDL was at target.   

## 2012-01-16 ENCOUNTER — Telehealth: Payer: Self-pay | Admitting: Cardiology

## 2012-01-16 NOTE — Telephone Encounter (Signed)
Lab results were given to pt. 

## 2012-01-16 NOTE — Telephone Encounter (Signed)
Pt rtn calling call re test results

## 2012-01-19 ENCOUNTER — Ambulatory Visit (INDEPENDENT_AMBULATORY_CARE_PROVIDER_SITE_OTHER): Payer: Medicare Other | Admitting: *Deleted

## 2012-01-19 ENCOUNTER — Encounter: Payer: Self-pay | Admitting: Internal Medicine

## 2012-01-19 DIAGNOSIS — I441 Atrioventricular block, second degree: Secondary | ICD-10-CM

## 2012-01-19 DIAGNOSIS — I4891 Unspecified atrial fibrillation: Secondary | ICD-10-CM

## 2012-01-20 ENCOUNTER — Encounter (INDEPENDENT_AMBULATORY_CARE_PROVIDER_SITE_OTHER): Payer: Medicare Other

## 2012-01-20 ENCOUNTER — Other Ambulatory Visit (INDEPENDENT_AMBULATORY_CARE_PROVIDER_SITE_OTHER): Payer: Medicare Other

## 2012-01-20 ENCOUNTER — Other Ambulatory Visit: Payer: Self-pay | Admitting: Cardiology

## 2012-01-20 DIAGNOSIS — I25810 Atherosclerosis of coronary artery bypass graft(s) without angina pectoris: Secondary | ICD-10-CM

## 2012-01-20 DIAGNOSIS — I2510 Atherosclerotic heart disease of native coronary artery without angina pectoris: Secondary | ICD-10-CM

## 2012-01-20 DIAGNOSIS — I6529 Occlusion and stenosis of unspecified carotid artery: Secondary | ICD-10-CM

## 2012-01-20 DIAGNOSIS — I10 Essential (primary) hypertension: Secondary | ICD-10-CM

## 2012-01-20 DIAGNOSIS — I4891 Unspecified atrial fibrillation: Secondary | ICD-10-CM

## 2012-01-20 DIAGNOSIS — R0989 Other specified symptoms and signs involving the circulatory and respiratory systems: Secondary | ICD-10-CM

## 2012-01-20 LAB — REMOTE PACEMAKER DEVICE
ATRIAL PACING PM: 16
BAMS-0001: 150 {beats}/min
BAMS-0003: 70 {beats}/min
DEVICE MODEL PM: 2326107
VENTRICULAR PACING PM: 31

## 2012-01-20 LAB — CBC WITH DIFFERENTIAL/PLATELET
Basophils Absolute: 0 10*3/uL (ref 0.0–0.1)
Eosinophils Relative: 1.7 % (ref 0.0–5.0)
HCT: 40 % (ref 39.0–52.0)
Hemoglobin: 13.1 g/dL (ref 13.0–17.0)
Lymphocytes Relative: 72.6 % — ABNORMAL HIGH (ref 12.0–46.0)
Lymphs Abs: 7 10*3/uL — ABNORMAL HIGH (ref 0.7–4.0)
Monocytes Relative: 4.3 % (ref 3.0–12.0)
Neutro Abs: 2 10*3/uL (ref 1.4–7.7)
WBC: 9.7 10*3/uL (ref 4.5–10.5)

## 2012-02-01 ENCOUNTER — Encounter: Payer: Self-pay | Admitting: *Deleted

## 2012-02-02 ENCOUNTER — Ambulatory Visit: Payer: Medicare Other | Admitting: Internal Medicine

## 2012-02-02 DIAGNOSIS — Z0289 Encounter for other administrative examinations: Secondary | ICD-10-CM

## 2012-02-03 NOTE — Progress Notes (Signed)
Pt notified of carotid doppler results.  

## 2012-02-07 ENCOUNTER — Ambulatory Visit (INDEPENDENT_AMBULATORY_CARE_PROVIDER_SITE_OTHER): Payer: Medicare Other | Admitting: Pharmacist

## 2012-02-07 DIAGNOSIS — I4891 Unspecified atrial fibrillation: Secondary | ICD-10-CM

## 2012-02-07 LAB — PROTIME-INR: INR: 6.7 ratio (ref 0.8–1.0)

## 2012-02-07 LAB — POCT INR: INR: 7

## 2012-02-14 ENCOUNTER — Ambulatory Visit (INDEPENDENT_AMBULATORY_CARE_PROVIDER_SITE_OTHER): Payer: Medicare Other | Admitting: *Deleted

## 2012-02-14 DIAGNOSIS — I4891 Unspecified atrial fibrillation: Secondary | ICD-10-CM

## 2012-02-14 LAB — POCT INR: INR: 1.2

## 2012-02-21 ENCOUNTER — Ambulatory Visit (INDEPENDENT_AMBULATORY_CARE_PROVIDER_SITE_OTHER): Payer: Medicare Other

## 2012-02-21 DIAGNOSIS — I4891 Unspecified atrial fibrillation: Secondary | ICD-10-CM

## 2012-02-21 LAB — POCT INR: INR: 3

## 2012-03-03 ENCOUNTER — Other Ambulatory Visit: Payer: Self-pay | Admitting: Family Medicine

## 2012-03-06 ENCOUNTER — Telehealth: Payer: Self-pay | Admitting: Internal Medicine

## 2012-03-06 ENCOUNTER — Ambulatory Visit (INDEPENDENT_AMBULATORY_CARE_PROVIDER_SITE_OTHER): Payer: Medicare Other | Admitting: *Deleted

## 2012-03-06 DIAGNOSIS — I4891 Unspecified atrial fibrillation: Secondary | ICD-10-CM

## 2012-03-06 LAB — POCT INR: INR: 3.4

## 2012-03-06 MED ORDER — ALBUTEROL SULFATE HFA 108 (90 BASE) MCG/ACT IN AERS: 2.0000 | INHALATION_SPRAY | Freq: Four times a day (QID) | RESPIRATORY_TRACT | Status: DC | PRN

## 2012-03-06 NOTE — Telephone Encounter (Signed)
Pt advised of Rx and pharmacy 

## 2012-03-06 NOTE — Telephone Encounter (Signed)
Caller: Kayl/Patient; PCP: Oliver Barre; CB#: [phone]; ; ; Call regarding RX Not At Pharmacy;  Pt's Pharmacy, [facility], [address], sent refill request per PT for Albuterol.  Pt denies worsen of  Breathing, Pt takes inhaler daily.  Triage offered, Pt/Caller refused, Pt has appt w/ Dr Jonny Ruiz week of 7-8.  PLEASE REQUESTING ALBUTEROL/VENTOLIN REFILL.

## 2012-03-12 ENCOUNTER — Encounter: Payer: Self-pay | Admitting: Neurosurgery

## 2012-03-13 ENCOUNTER — Encounter (INDEPENDENT_AMBULATORY_CARE_PROVIDER_SITE_OTHER): Payer: Medicare Other | Admitting: *Deleted

## 2012-03-13 ENCOUNTER — Encounter: Payer: Self-pay | Admitting: Neurosurgery

## 2012-03-13 ENCOUNTER — Ambulatory Visit (INDEPENDENT_AMBULATORY_CARE_PROVIDER_SITE_OTHER): Payer: Medicare Other | Admitting: Neurosurgery

## 2012-03-13 VITALS — BP 128/63 | HR 62 | Resp 16 | Ht 69.0 in | Wt 173.2 lb

## 2012-03-13 DIAGNOSIS — I714 Abdominal aortic aneurysm, without rupture: Secondary | ICD-10-CM

## 2012-03-13 NOTE — Addendum Note (Signed)
Addended by: Sharee Pimple on: 03/13/2012 10:42 AM   Modules accepted: Orders

## 2012-03-13 NOTE — Progress Notes (Signed)
[facility] AAA/PAD/PVD Office Note  CC: Six-month AAA duplex Referring Physician: Early  History of Present Illness: 78 year old male patient of Dr. Bosie Helper followed for known AAA. The patient denies any abdominal or back pain. The patient denies any new medical diagnoses or recent surgeries.  Past Medical History  Diagnosis Date  . CAD (coronary artery disease)   . Hypertension   . Vitamin d deficiency   . Chronic back pain   . Thyroid disease   . Anemia   . ED (erectile dysfunction)   . Arthritis   . RLS (restless legs syndrome)   . GERD (gastroesophageal reflux disease)   . COPD (chronic obstructive pulmonary disease)   . Hyperlipidemia   . Atrial fibrillation   . Second degree Mobitz II AV block     s/p PPM  . Asthma 09/15/2011  . Long term current use of anticoagulant 09/15/2011  . Thrombocytopenia 09/15/2011    ROS: [x]  Positive   [ ]  Denies    General: [ ]  Weight loss, [ ]  Fever, [ ]  chills Neurologic: [ ]  Dizziness, [ ]  Blackouts, [ ]  Seizure [ ]  Stroke, [ ]  "Mini stroke", [ ]  Slurred speech, [ ]  Temporary blindness; [ ]  weakness in arms or legs, [ ]  Hoarseness Cardiac: [ ]  Chest pain/pressure, [ ]  Shortness of breath at rest [ ]  Shortness of breath with exertion, [ ]  Atrial fibrillation or irregular heartbeat Vascular: [ ]  Pain in legs with walking, [ ]  Pain in legs at rest, [ ]  Pain in legs at night,  [ ]  Non-healing ulcer, [ ]  Blood clot in vein/DVT,   Pulmonary: [ ]  Home oxygen, [ ]  Productive cough, [ ]  Coughing up blood, [ ]  Asthma,  [ ]  Wheezing Musculoskeletal:  [ ]  Arthritis, [ ]  Low back pain, [ ]  Joint pain Hematologic: [ ]  Easy Bruising, [ ]  Anemia; [ ]  Hepatitis Gastrointestinal: [ ]  Blood in stool, [ ]  Gastroesophageal Reflux/heartburn, [ ]  Trouble swallowing Urinary: [ ]  chronic Kidney disease, [ ]  on HD - [ ]  MWF or [ ]  TTHS, [ ]  Burning with urination, [ ]  Difficulty urinating Skin: [ ]  Rashes, [ ]  Wounds Psychological:  [ ]  Anxiety, [ ]  Depression   Social History History  Substance Use Topics  . Smoking status: Former Games developer  . Smokeless tobacco: Never Used  . Alcohol Use: Yes    Family History Family History  Problem Relation Age of Onset  . Diabetes Sister     Allergies  Allergen Reactions  . Amoxicillin     REACTION: unspecified  . Penicillins     Current Outpatient Prescriptions  Medication Sig Dispense Refill  . albuterol (VENTOLIN HFA) 108 (90 BASE) MCG/ACT inhaler Inhale 2 puffs into the lungs every 6 (six) hours as needed.  1 Inhaler  0  . aspirin 81 MG tablet Take 81 mg by mouth daily.        . clonazePAM (KLONOPIN) 0.5 MG tablet 1-2 tabs by mouth at bedtime for sleep or nerves as needed  60 tablet  3  . EPINEPHrine (EPIPEN JR) 0.15 MG/0.3ML injection Inject 0.15 mg into the muscle as needed.        . isosorbide mononitrate (IMDUR) 30 MG 24 hr tablet take 1/2 tablet by mouth once daily  15 tablet  11  . Loratadine (CLARITIN PO) Take by mouth daily.      . metoprolol succinate (TOPROL XL) 25 MG 24 hr tablet Take 1 tablet (25  mg total) by mouth daily.  30 tablet  11  . Multiple Vitamin (MULTIVITAMIN) tablet Take 1 tablet by mouth daily.       . pantoprazole (PROTONIX) 40 MG tablet Take 1 tablet (40 mg total) by mouth daily.  90 tablet  3  . rOPINIRole (REQUIP) 4 MG tablet take 1 tablet by mouth 1 HOUR BEFORE BEDTIME AND 1/2 TABLET EACH MORNING.  45 tablet  11  . simvastatin (ZOCOR) 10 MG tablet take 1 tablet by mouth at bedtime  90 tablet  3  . warfarin (COUMADIN) 5 MG tablet USE AS DIRECTED BY ANTICOAGULATION CLINIC. 1 TABLET BY MOUTH EVERY EVENING EXCEPT TAKE 1 AND 1/2 TABS ON MONDAY, WEDNESDAY, AND FRIDAY.  40 tablet  11  . clotrimazole-betamethasone (LOTRISONE) cream Apply 1 application topically as needed.       . fexofenadine (ALLEGRA) 180 MG tablet Take 180 mg by mouth daily.        Marland Kitchen omeprazole (PRILOSEC) 40 MG capsule take 1 capsule by mouth every morning and 1 capsule by  mouth every evening  180 capsule  3    Physical Examination  Filed Vitals:   03/13/12 1001  BP: 128/63  Pulse: 62  Resp: 16    Body mass index is 25.58 kg/(m^2).  General:  WDWN in NAD Gait: Normal HEENT: WNL Eyes: Pupils equal Pulmonary: normal non-labored breathing , without Rales, rhonchi,  wheezing Cardiac: RRR, without  Murmurs, rubs or gallops; No carotid bruits Abdomen: soft, NT, no masses Skin: no rashes, ulcers noted Vascular Exam/Pulses: 3+ radial pulses bilaterally, 2+ femoral pulses bilaterally, palpable nontender abdominal mass  Extremities without ischemic changes, no Gangrene , no cellulitis; no open wounds;  Musculoskeletal: no muscle wasting or atrophy  Neurologic: A&O X 3; Appropriate Affect ; SENSATION: normal; MOTOR FUNCTION:  moving all extremities equally. Speech is fluent/normal  Non-Invasive Vascular Imaging: AAA duplex today shows a maximum measurement of 4.5 x 4.6 which is virtually unchanged from previous exam  ASSESSMENT/PLAN: Asymptomatic AAA that will remain on six-month surveillance. The patient's in agreement with this, his questions were encouraged and answered. Patient knows the signs and symptoms of rupture and knows to report to the nearest emergency department should that occur.  Lauree Chandler ANP  Clinic M.D.: [facility]

## 2012-03-14 ENCOUNTER — Encounter: Payer: Self-pay | Admitting: Internal Medicine

## 2012-03-14 ENCOUNTER — Other Ambulatory Visit (INDEPENDENT_AMBULATORY_CARE_PROVIDER_SITE_OTHER): Payer: Medicare Other

## 2012-03-14 ENCOUNTER — Ambulatory Visit (INDEPENDENT_AMBULATORY_CARE_PROVIDER_SITE_OTHER): Payer: Medicare Other | Admitting: Internal Medicine

## 2012-03-14 VITALS — BP 122/80 | HR 84 | Temp 97.5°F | Ht 69.0 in | Wt 173.2 lb

## 2012-03-14 DIAGNOSIS — J449 Chronic obstructive pulmonary disease, unspecified: Secondary | ICD-10-CM

## 2012-03-14 DIAGNOSIS — I10 Essential (primary) hypertension: Secondary | ICD-10-CM

## 2012-03-14 DIAGNOSIS — E785 Hyperlipidemia, unspecified: Secondary | ICD-10-CM

## 2012-03-14 DIAGNOSIS — R21 Rash and other nonspecific skin eruption: Secondary | ICD-10-CM

## 2012-03-14 DIAGNOSIS — L309 Dermatitis, unspecified: Secondary | ICD-10-CM | POA: Insufficient documentation

## 2012-03-14 LAB — CBC WITH DIFFERENTIAL/PLATELET
Eosinophils Relative: 1.9 % (ref 0.0–5.0)
HCT: 41.9 % (ref 39.0–52.0)
Hemoglobin: 13.9 g/dL (ref 13.0–17.0)
Lymphocytes Relative: 72.9 % — ABNORMAL HIGH (ref 12.0–46.0)
Lymphs Abs: 8 10*3/uL — ABNORMAL HIGH (ref 0.7–4.0)
Monocytes Relative: 4.1 % (ref 3.0–12.0)
Neutro Abs: 2.3 10*3/uL (ref 1.4–7.7)
RBC: 4.47 Mil/uL (ref 4.22–5.81)
WBC: 11 10*3/uL — ABNORMAL HIGH (ref 4.5–10.5)

## 2012-03-14 LAB — LIPID PANEL
HDL: 52.9 mg/dL (ref >39.00)
Total CHOL/HDL Ratio: 2
Triglycerides: 88 mg/dL (ref 0.0–149.0)
VLDL: 17.6 mg/dL (ref 0.0–40.0)

## 2012-03-14 LAB — BASIC METABOLIC PANEL
BUN: 12 mg/dL (ref 6–23)
Calcium: 9.2 mg/dL (ref 8.4–10.5)
Creatinine, Ser: 1.1 mg/dL (ref 0.4–1.5)
GFR: 70.66 mL/min (ref >60.00)
Glucose, Bld: 93 mg/dL (ref 70–99)
Sodium: 141 mEq/L (ref 135–145)

## 2012-03-14 LAB — HEPATIC FUNCTION PANEL
Albumin: 4 g/dL (ref 3.5–5.2)
Total Protein: 6.8 g/dL (ref 6.0–8.3)

## 2012-03-14 MED ORDER — ALBUTEROL SULFATE HFA 108 (90 BASE) MCG/ACT IN AERS: 2.0000 | INHALATION_SPRAY | Freq: Four times a day (QID) | RESPIRATORY_TRACT | Status: DC | PRN

## 2012-03-14 NOTE — Patient Instructions (Addendum)
Continue all other medications as before Your refills were done as requested today Please go to LAB in the Basement for the blood and/or urine tests to be done today You will be contacted by phone if any changes need to be made immediately.  Otherwise, you will receive a letter about your results with an explanation. Please return in 6 months, or sooner if needed Please call if you change your mind about the Neurology evalaution for memory

## 2012-03-18 ENCOUNTER — Encounter: Payer: Self-pay | Admitting: Internal Medicine

## 2012-03-18 NOTE — Assessment & Plan Note (Signed)
stable overall by hx and exam, most recent data reviewed with pt, and pt to continue medical treatment as before SpO2 Readings from Last 3 Encounters:  03/14/12 97%  03/13/12 99%  09/15/11 96%

## 2012-03-18 NOTE — Assessment & Plan Note (Signed)
stable overall by hx and exam, most recent data reviewed with pt, and pt to continue medical treatment as before Lab Results  Component Value Date   LDLCALC 61 03/14/2012

## 2012-03-18 NOTE — Assessment & Plan Note (Signed)
stable overall by hx and exam, most recent data reviewed with pt, and pt to continue medical treatment as before BP Readings from Last 3 Encounters:  03/14/12 122/80  03/13/12 128/63  01/05/12 120/70

## 2012-03-18 NOTE — Progress Notes (Signed)
Subjective:    Patient ID: [name] [name], male    DOB: [birth year], 78 y.o.   MRN: [id number]  HPI  Here to f/u; overall doing ok,  Pt denies chest pain, increased sob or doe, wheezing, orthopnea, PND, increased LE swelling, palpitations, dizziness or syncope.  Pt denies new neurological symptoms such as new headache, or facial or extremity weakness or numbness   Pt denies polydipsia, polyuria, or low sugar symptoms such as weakness or confusion improved with po intake.  Pt states overall good compliance with meds, trying to follow lower cholesterol diet, wt overall stable but little exercise however.  Does have significant stasis dermatitis type changes to the bilat pretibial areas.  Pt worries about some forgetfulness that have become worse recently.   Pt denies fever, wt loss, night sweats, loss of appetite, or other constitutional symptoms  Overall good compliance with treatment, and good medicine tolerability.  Denies worsening depressive symptoms, suicidal ideation, or panic. Past Medical History  Diagnosis Date  . CAD (coronary artery disease)   . Hypertension   . Vitamin d deficiency   . Chronic back pain   . Thyroid disease   . Anemia   . ED (erectile dysfunction)   . Arthritis   . RLS (restless legs syndrome)   . GERD (gastroesophageal reflux disease)   . COPD (chronic obstructive pulmonary disease)   . Hyperlipidemia   . Atrial fibrillation   . Second degree Mobitz II AV block     s/p PPM  . Asthma 09/15/2011  . Long term current use of anticoagulant 09/15/2011  . Thrombocytopenia 09/15/2011   Past Surgical History  Procedure Date  . Coronary artery bypass graft   . Cataract extraction   . Pacemaker insertion     reports that he has quit smoking. He has never used smokeless tobacco. He reports that he drinks alcohol. He reports that he does not use illicit drugs. family history includes Diabetes in his sister. Allergies  Allergen Reactions  . Amoxicillin     REACTION:  unspecified  . Penicillins    Current Outpatient Prescriptions on File Prior to Visit  Medication Sig Dispense Refill  . albuterol (VENTOLIN HFA) 108 (90 BASE) MCG/ACT inhaler Inhale 2 puffs into the lungs every 6 (six) hours as needed.  1 Inhaler  11  . aspirin 81 MG tablet Take 81 mg by mouth daily.        . clonazePAM (KLONOPIN) 0.5 MG tablet 1-2 tabs by mouth at bedtime for sleep or nerves as needed  60 tablet  3  . clotrimazole-betamethasone (LOTRISONE) cream Apply 1 application topically as needed.       Marland Kitchen EPINEPHrine (EPIPEN JR) 0.15 MG/0.3ML injection Inject 0.15 mg into the muscle as needed.        . fexofenadine (ALLEGRA) 180 MG tablet Take 180 mg by mouth daily.        . isosorbide mononitrate (IMDUR) 30 MG 24 hr tablet take 1/2 tablet by mouth once daily  15 tablet  11  . Loratadine (CLARITIN PO) Take by mouth daily.      . metoprolol succinate (TOPROL XL) 25 MG 24 hr tablet Take 1 tablet (25 mg total) by mouth daily.  30 tablet  11  . Multiple Vitamin (MULTIVITAMIN) tablet Take 1 tablet by mouth daily.       . pantoprazole (PROTONIX) 40 MG tablet Take 1 tablet (40 mg total) by mouth daily.  90 tablet  3  . rOPINIRole (REQUIP) 4 MG  tablet take 1 tablet by mouth 1 HOUR BEFORE BEDTIME AND 1/2 TABLET EACH MORNING.  45 tablet  11  . simvastatin (ZOCOR) 10 MG tablet take 1 tablet by mouth at bedtime  90 tablet  3  . warfarin (COUMADIN) 5 MG tablet USE AS DIRECTED BY ANTICOAGULATION CLINIC. 1 TABLET BY MOUTH EVERY EVENING EXCEPT TAKE 1 AND 1/2 TABS ON MONDAY, WEDNESDAY, AND FRIDAY.  40 tablet  11   Review of Systems Review of Systems  Constitutional: Negative for diaphoresis and unexpected weight change.  HENT: Negative for tinnitus.   Eyes: Negative for photophobia and visual disturbance.  Respiratory: Negative for choking and stridor.   Gastrointestinal: Negative for vomiting and blood in stool.  Genitourinary: Negative for hematuria and decreased urine volume.  Musculoskeletal:  Negative for gait problem.  Skin: Negative for color change and wound.  Neurological: Negative for tremors and numbness.  Psychiatric/Behavioral: Negative for decreased concentration. The patient is not hyperactive.      Objective:   Physical Exam BP 122/80  Pulse 84  Temp 97.5 F (36.4 C) (Oral)  Ht 5\' 9"  (1.753 m)  Wt 173 lb 4 oz (78.586 kg)  BMI 25.58 kg/m2  SpO2 97% Physical Exam  VS noted, not ill appaering Constitutional: Pt appears well-developed and well-nourished.  HENT: Head: Normocephalic.  Right Ear: External ear normal.  Left Ear: External ear normal.  Eyes: Conjunctivae and EOM are normal. Pupils are equal, round, and reactive to light.  Neck: Normal range of motion. Neck supple.  Cardiovascular: Normal rate and regular rhythm.   Pulmonary/Chest: Effort normal and breath sounds normal.  Abd:  Soft, NT, non-distended, + BS Neurological: Pt is alert. No cranial nerve deficit, motor/dtr intact, gait intact  Skin: Skin is warm. No erythema. brownish change to pretibial areas bilat Psychiatric: Pt behavior is normal. Thought content normal. Mild nervous     Assessment & Plan:

## 2012-03-18 NOTE — Assessment & Plan Note (Signed)
C/w stasis dermatitis, reassured, no specific tx at this time

## 2012-03-20 NOTE — Procedures (Unsigned)
DUPLEX ULTRASOUND OF ABDOMINAL AORTA  INDICATION:  Abdominal aortic aneurysm  HISTORY: Diabetes:  No Cardiac:  MI, CABG, pacemaker Hypertension:  No Smoking:  Previous Connective Tissue Disorder: Family History:  No Previous Surgery:  No  DUPLEX EXAM:         AP (cm)                   TRANSVERSE (cm) Proximal             2.3 cm                    2.4 cm Mid                  2.8 cm                    2.8 cm Distal               4.5 cm                    4.6 cm Right Iliac          Not visualized            Not visualized Left Iliac           Not visualized            Not visualized  PREVIOUS:  Date:  09/14/2011  AP:  4.47  TRANSVERSE:  4.63  IMPRESSION: 1. Aneurysmal dilatation of the distal abdominal aorta noted with no     significant change noted in maximum diameter when compared to the     previous exam. 2. The bilateral common iliac arteries were not adequately visualized     due to overlying bowel gas patterns.  ___________________________________________ [name], M.D.  CH/MEDQ  D:  03/16/2012  T:  03/16/2012  Job:  [id number]

## 2012-03-21 ENCOUNTER — Telehealth: Payer: Self-pay | Admitting: Internal Medicine

## 2012-03-21 ENCOUNTER — Ambulatory Visit (INDEPENDENT_AMBULATORY_CARE_PROVIDER_SITE_OTHER): Payer: Medicare Other | Admitting: Internal Medicine

## 2012-03-21 VITALS — BP 122/84 | HR 72 | Temp 98.1°F | Resp 16

## 2012-03-21 DIAGNOSIS — J45909 Unspecified asthma, uncomplicated: Secondary | ICD-10-CM

## 2012-03-21 DIAGNOSIS — L259 Unspecified contact dermatitis, unspecified cause: Secondary | ICD-10-CM

## 2012-03-21 DIAGNOSIS — L309 Dermatitis, unspecified: Secondary | ICD-10-CM

## 2012-03-21 MED ORDER — HYDROXYZINE HCL 10 MG PO TABS: 10.0000 mg | ORAL_TABLET | Freq: Four times a day (QID) | ORAL | Status: AC | PRN

## 2012-03-21 MED ORDER — TRIAMCINOLONE ACETONIDE 0.5 % EX CREA: TOPICAL_CREAM | Freq: Three times a day (TID) | CUTANEOUS | Status: DC

## 2012-03-21 NOTE — Patient Instructions (Signed)

## 2012-03-21 NOTE — Telephone Encounter (Signed)
Caller: [name]/Friend; PCP: Oliver Barre; CB#: [phone]; ; ; Call regarding Itching;  Jomarie Longs developed itching of bilateral arms on 03/20/12.  Arms appear to have bleeding under the skin.  Redness under the skin from his elbow to his wrist, onset 03/20/12.  Also states that his skin is black in these areas. Utilized Rash Guideline.  ED disposition.  No appt available within 1 hr.  Called office and spoke with [facility].  Dahlia advised to schedule appt in office at 1600 today.  Appt scheduled for today at 1600 with Dr. Yetta Barre.

## 2012-03-25 ENCOUNTER — Encounter: Payer: Self-pay | Admitting: Internal Medicine

## 2012-03-25 NOTE — Progress Notes (Signed)
  Subjective:    Patient ID: [name] [name], male    DOB: 04-Jul-1926, 78 y.o.   MRN: [id number]  Rash This is a recurrent problem. The current episode started 1 to 4 weeks ago. The problem has been gradually worsening since onset. The affected locations include the left arm and right arm. The rash is characterized by dryness, itchiness and redness. He was exposed to nothing. Pertinent negatives include no anorexia, congestion, cough, diarrhea, eye pain, facial edema, fatigue, fever, joint pain, nail changes, rhinorrhea, shortness of breath, sore throat or vomiting. His past medical history is significant for asthma and eczema.      Review of Systems  Constitutional: Negative for fever, chills, diaphoresis, activity change, appetite change, fatigue and unexpected weight change.  HENT: Negative.  Negative for congestion, sore throat and rhinorrhea.   Eyes: Negative.  Negative for pain.  Respiratory: Negative for cough, chest tightness, shortness of breath, wheezing and stridor.   Cardiovascular: Negative for chest pain, palpitations and leg swelling.  Gastrointestinal: Negative.  Negative for vomiting, diarrhea and anorexia.  Genitourinary: Negative.   Musculoskeletal: Negative for myalgias, back pain, joint pain, joint swelling, arthralgias and gait problem.  Skin: Positive for color change and rash. Negative for nail changes, pallor and wound.  Neurological: Negative.   Hematological: Negative for adenopathy. Does not bruise/bleed easily.  Psychiatric/Behavioral: Negative.        Objective:   Physical Exam  Vitals reviewed. Constitutional: He is oriented to person, place, and time. He appears well-developed and well-nourished. No distress.  HENT:  Head: Normocephalic and atraumatic.  Mouth/Throat: Oropharynx is clear and moist. No oropharyngeal exudate.  Eyes: Conjunctivae are normal. Right eye exhibits no discharge. Left eye exhibits no discharge. No scleral icterus.  Neck: Normal  range of motion. Neck supple. No JVD present. No tracheal deviation present. No thyromegaly present.  Cardiovascular: Normal rate, regular rhythm, normal heart sounds and intact distal pulses.  Exam reveals no gallop and no friction rub.   No murmur heard. Pulmonary/Chest: Effort normal and breath sounds normal. No stridor. No respiratory distress. He has no wheezes. He has no rales. He exhibits no tenderness.  Abdominal: Soft. Bowel sounds are normal. He exhibits no distension and no mass. There is no tenderness. There is no rebound and no guarding.  Musculoskeletal: Normal range of motion. He exhibits no edema and no tenderness.  Lymphadenopathy:    He has no cervical adenopathy.  Neurological: He is oriented to person, place, and time.  Skin: Skin is warm and dry. Abrasion, ecchymosis and rash noted. No bruising, no burn, no laceration, no lesion, no petechiae and no purpura noted. Rash is papular. Rash is not macular, not nodular, not pustular, not vesicular and not urticarial. He is not diaphoretic. No cyanosis or erythema. No pallor. Nails show no clubbing.          On both sides of the forearms there are papules with confluence and diffuse faint ecchymosis associated with linear excoriations. There is some lichenification. There is no warmth, erythroderma, warmth, streaking, vesicles, exudates, or breakdown  Psychiatric: He has a normal mood and affect. His behavior is normal. Judgment and thought content normal.          Assessment & Plan:

## 2012-03-25 NOTE — Assessment & Plan Note (Signed)
He is doing well on ventolin inh

## 2012-03-25 NOTE — Assessment & Plan Note (Signed)
I will treat this with kenalog cream and antihistamines

## 2012-03-27 ENCOUNTER — Ambulatory Visit (INDEPENDENT_AMBULATORY_CARE_PROVIDER_SITE_OTHER): Payer: Medicare Other

## 2012-03-27 DIAGNOSIS — I4891 Unspecified atrial fibrillation: Secondary | ICD-10-CM

## 2012-03-27 LAB — POCT INR: INR: 2.2

## 2012-04-24 ENCOUNTER — Ambulatory Visit (INDEPENDENT_AMBULATORY_CARE_PROVIDER_SITE_OTHER): Payer: Medicare Other

## 2012-04-24 DIAGNOSIS — I4891 Unspecified atrial fibrillation: Secondary | ICD-10-CM

## 2012-04-25 ENCOUNTER — Other Ambulatory Visit: Payer: Self-pay | Admitting: Internal Medicine

## 2012-04-26 ENCOUNTER — Encounter: Payer: Self-pay | Admitting: Internal Medicine

## 2012-04-26 ENCOUNTER — Encounter: Payer: Self-pay | Admitting: *Deleted

## 2012-04-26 ENCOUNTER — Ambulatory Visit (INDEPENDENT_AMBULATORY_CARE_PROVIDER_SITE_OTHER): Payer: Medicare Other | Admitting: *Deleted

## 2012-04-26 DIAGNOSIS — Z950 Presence of cardiac pacemaker: Secondary | ICD-10-CM | POA: Insufficient documentation

## 2012-04-26 DIAGNOSIS — I441 Atrioventricular block, second degree: Secondary | ICD-10-CM

## 2012-04-26 LAB — REMOTE PACEMAKER DEVICE
AL AMPLITUDE: 3.8 mv
AL IMPEDENCE PM: 400 Ohm
BAMS-0003: 70 {beats}/min
BATTERY VOLTAGE: 2.92 V
BRDY-0002RA: 60 {beats}/min
BRDY-0003RA: 120 {beats}/min
RV LEAD AMPLITUDE: 5.8 mv
VENTRICULAR PACING PM: 44

## 2012-04-26 NOTE — Telephone Encounter (Signed)
Faxed hardcopy to pharmacy. 

## 2012-04-26 NOTE — Telephone Encounter (Signed)
Done hardcopy to robin  

## 2012-05-08 ENCOUNTER — Encounter: Payer: Self-pay | Admitting: *Deleted

## 2012-05-09 ENCOUNTER — Encounter: Payer: Self-pay | Admitting: General Practice

## 2012-05-14 ENCOUNTER — Other Ambulatory Visit: Payer: Self-pay | Admitting: Internal Medicine

## 2012-05-15 ENCOUNTER — Ambulatory Visit (INDEPENDENT_AMBULATORY_CARE_PROVIDER_SITE_OTHER): Payer: Medicare Other | Admitting: Pharmacist

## 2012-05-15 DIAGNOSIS — I4891 Unspecified atrial fibrillation: Secondary | ICD-10-CM

## 2012-05-17 ENCOUNTER — Ambulatory Visit (INDEPENDENT_AMBULATORY_CARE_PROVIDER_SITE_OTHER): Payer: Medicare Other | Admitting: Internal Medicine

## 2012-05-17 ENCOUNTER — Other Ambulatory Visit (INDEPENDENT_AMBULATORY_CARE_PROVIDER_SITE_OTHER): Payer: Medicare Other

## 2012-05-17 ENCOUNTER — Encounter: Payer: Self-pay | Admitting: Internal Medicine

## 2012-05-17 VITALS — BP 110/60 | HR 71 | Temp 98.0°F | Ht 69.0 in | Wt 174.2 lb

## 2012-05-17 DIAGNOSIS — M79609 Pain in unspecified limb: Secondary | ICD-10-CM

## 2012-05-17 DIAGNOSIS — M79606 Pain in leg, unspecified: Secondary | ICD-10-CM

## 2012-05-17 DIAGNOSIS — T148XXA Other injury of unspecified body region, initial encounter: Secondary | ICD-10-CM | POA: Insufficient documentation

## 2012-05-17 LAB — CBC WITH DIFFERENTIAL/PLATELET
Basophils Absolute: 0 10*3/uL (ref 0.0–0.1)
Basophils Relative: 0.1 % (ref 0.0–3.0)
Eosinophils Absolute: 0.1 10*3/uL (ref 0.0–0.7)
HCT: 35.8 % — ABNORMAL LOW (ref 39.0–52.0)
Hemoglobin: 11.9 g/dL — ABNORMAL LOW (ref 13.0–17.0)
Lymphocytes Relative: 74.2 % — ABNORMAL HIGH (ref 12.0–46.0)
Lymphs Abs: 9.8 10*3/uL — ABNORMAL HIGH (ref 0.7–4.0)
MCHC: 33.2 g/dL (ref 30.0–36.0)
MCV: 93.6 fl (ref 78.0–100.0)
Monocytes Absolute: 0.6 10*3/uL (ref 0.1–1.0)
Neutro Abs: 2.7 10*3/uL (ref 1.4–7.7)
RBC: 3.83 Mil/uL — ABNORMAL LOW (ref 4.22–5.81)
RDW: 13.8 % (ref 11.5–14.6)

## 2012-05-17 LAB — HEPATIC FUNCTION PANEL
Albumin: 3.9 g/dL (ref 3.5–5.2)
Alkaline Phosphatase: 58 U/L (ref 39–117)
Total Protein: 6.6 g/dL (ref 6.0–8.3)

## 2012-05-17 LAB — BASIC METABOLIC PANEL
CO2: 28 mEq/L (ref 19–32)
Chloride: 107 mEq/L (ref 96–112)
Creatinine, Ser: 1.2 mg/dL (ref 0.4–1.5)
Potassium: 4.3 mEq/L (ref 3.5–5.1)
Sodium: 142 mEq/L (ref 135–145)

## 2012-05-17 LAB — PROTIME-INR: INR: 3.2 ratio — ABNORMAL HIGH (ref 0.8–1.0)

## 2012-05-17 NOTE — Assessment & Plan Note (Signed)
As above re: hematoma

## 2012-05-17 NOTE — Patient Instructions (Addendum)
Please stop the coumadin Take all new medications as prescribed - the one time dose of Vit K Please go to LAB in the Basement for the blood and/or urine tests to be done today You will be contacted by phone if any changes need to be made immediately.  Otherwise, you will receive a letter about your results with an explanation. You will be contacted regarding the referral for: CT scan for the leg (the office will call in the AM to schedule this) Continue all other medications as before, including the pain medication you have at home Please return in 10 days (the office will call)

## 2012-05-17 NOTE — Progress Notes (Signed)
Subjective:    Patient ID: [name] [name], male    DOB: [birth year], 78 y.o.   MRN: [id number]  HPI  Here with acute complaint of 3 days gradually worsening dull mod pain, swelling to right thigh from groin to almost knee level;  Pain approx 5/10, declines pain med use (has med for pain at home he has never used), nothng makes better or worse, denies any pain to knee or distal leg, has some pain to ambulate but only to the right thigh area - no claudication type;  No apparent inciting event such as fall or sitting down hard though lady friend who is with him (lives with him for 4 yrs) is concerns he continues to try to do too much such as yard work;  Heat pad she tried no help.  Nothing else makes better or worse.  No fever, has some off white/bluish color to right thigh, no other skin change or erythema, fluctuance or drainage.  Pt denies chest pain, increased sob or doe, wheezing, orthopnea, PND, increased LE swelling, palpitations, dizziness or syncope, except for the above.  Pt denies new neurological symptoms such as new headache, or facial or extremity weakness or numbness.   Pt denies polydipsia, polyuria.  Is on chronic coumadin for afib; also s/o pacemaker for type II AV block, CAD/cabg, known AAA, last seen per card/Dr Riley Kill may 2013.  Had recent INR at 2.5 - sept 10.  No other overt bleeding or bruising Past Medical History  Diagnosis Date  . CAD (coronary artery disease)   . Hypertension   . Vitamin d deficiency   . Chronic back pain   . Thyroid disease   . Anemia   . ED (erectile dysfunction)   . Arthritis   . RLS (restless legs syndrome)   . GERD (gastroesophageal reflux disease)   . COPD (chronic obstructive pulmonary disease)   . Hyperlipidemia   . Atrial fibrillation   . Second degree Mobitz II AV block     s/p PPM  . Asthma 09/15/2011  . Long term current use of anticoagulant 09/15/2011  . Thrombocytopenia 09/15/2011   Past Surgical History  Procedure Date  . Coronary  artery bypass graft   . Cataract extraction   . Pacemaker insertion     reports that he has quit smoking. He has never used smokeless tobacco. He reports that he drinks alcohol. He reports that he does not use illicit drugs. family history includes Diabetes in his sister. Allergies  Allergen Reactions  . Amoxicillin     REACTION: unspecified  . Penicillins    Current Outpatient Prescriptions on File Prior to Visit  Medication Sig Dispense Refill  . albuterol (VENTOLIN HFA) 108 (90 BASE) MCG/ACT inhaler Inhale 2 puffs into the lungs every 6 (six) hours as needed.  1 Inhaler  11  . aspirin 81 MG tablet Take 81 mg by mouth daily.        . clonazePAM (KLONOPIN) 0.5 MG tablet take 1 to 2 tablets by mouth at bedtime if needed for sleep OR nerves  60 tablet  3  . EPINEPHrine (EPIPEN JR) 0.15 MG/0.3ML injection Inject 0.15 mg into the muscle as needed.        . isosorbide mononitrate (IMDUR) 30 MG 24 hr tablet take 1/2 tablet by mouth once daily  15 tablet  11  . metoprolol succinate (TOPROL-XL) 25 MG 24 hr tablet take 1 tablet by mouth once daily  30 tablet  11  . Multiple Vitamin (  MULTIVITAMIN) tablet Take 1 tablet by mouth daily.       . pantoprazole (PROTONIX) 40 MG tablet Take 1 tablet (40 mg total) by mouth daily.  90 tablet  3  . rOPINIRole (REQUIP) 4 MG tablet take 1 tablet by mouth 1 HOUR BEFORE BEDTIME AND 1/2 TABLET EACH MORNING.  45 tablet  11  . simvastatin (ZOCOR) 10 MG tablet take 1 tablet by mouth at bedtime  90 tablet  3  . triamcinolone cream (KENALOG) 0.5 % Apply topically 3 (three) times daily.  30 g  3  . warfarin (COUMADIN) 5 MG tablet USE AS DIRECTED BY ANTICOAGULATION CLINIC. 1 TABLET BY MOUTH EVERY EVENING EXCEPT TAKE 1 AND 1/2 TABS ON MONDAY, WEDNESDAY, AND FRIDAY.  40 tablet  11   Review of Systems Review of Systems  Constitutional: Negative for diaphoresis and unexpected weight change.  HENT: Negative for tinnitus.    Respiratory: Negative for choking and stridor.     Gastrointestinal: Negative for vomiting and blood in stool.  Genitourinary: Negative for hematuria and decreased urine volume.  Musculoskeletal: Negative for acute joint swelling , or falls Skin: Negative for wound or rash Neurological: Negative for tremors       Objective:   Physical Exam BP 110/60  Pulse 71  Temp 98 F (36.7 C) (Oral)  Ht 5\' 9"  (1.753 m)  Wt 174 lb 4 oz (79.039 kg)  BMI 25.73 kg/m2  SpO2 97% Physical Exam  VS noted, not ill appearing, some HOH Constitutional: Pt appears well-developed and well-nourished.  HENT: Head: Normocephalic.  Right Ear: External ear normal.  Left Ear: External ear normal.  Eyes: Conjunctivae and EOM are normal. Pupils are equal, round, and reactive to light.  Neck: Normal range of motion. Neck supple.  Cardiovascular: Normal rate and regular rhythm.   Pulmonary/Chest: Effort normal and breath sounds normal - no rales or wheezing.  Abd:  Soft, NT, non-distended, + BS Neurological: Pt is alert. Not confused, motor grossly intact, gait mild antalgic but walks easily Skin: Skin is warm. No rash Right thigh:  Circumference I estimate 1/3 larger than left, diffuse tender, offwhite/bluish color skin but warm, no fluctuance or other skin change or drainage, groin pulse full, 1+ post knee palp pulse, dorsalis pedis trace to 1+, calf Nontender/nonswollen, right knee with mild crepitus but NT, FROM, no effusion Psychiatric: Pt behavior is normal    Assessment & Plan:

## 2012-05-17 NOTE — Assessment & Plan Note (Addendum)
Very likely the cause of right thigh swelling and pain, apparently spontaneous, recent INR not overly elevated, leg per pt may be getting mild worse in the last 3 days since onset, no sign of compartment syndrome - for now for CT to confirm, labs including repeat INR, and Hgb, one dose Vit K 10 mg po to nudge the INR lower, hold coumadin for now, declines pain med, and f/u in 10 days to re-assess, consider re-start coumadin, let cardiology know as well  Note:  Total time for pt hx, exam, review of record with pt in the room, determination of diagnoses and plan for further eval and tx is > 40 min, with over 50% spent in coordination and counseling of patient

## 2012-05-18 ENCOUNTER — Encounter: Payer: Self-pay | Admitting: Internal Medicine

## 2012-05-18 ENCOUNTER — Ambulatory Visit (INDEPENDENT_AMBULATORY_CARE_PROVIDER_SITE_OTHER)
Admission: RE | Admit: 2012-05-18 | Discharge: 2012-05-18 | Disposition: A | Discharge status: Home / Self Care | Payer: Medicare Other | Source: Ambulatory Visit | Attending: Internal Medicine | Admitting: Internal Medicine

## 2012-05-18 DIAGNOSIS — M79609 Pain in unspecified limb: Secondary | ICD-10-CM

## 2012-05-18 DIAGNOSIS — M7989 Other specified soft tissue disorders: Secondary | ICD-10-CM

## 2012-05-18 DIAGNOSIS — M79606 Pain in leg, unspecified: Secondary | ICD-10-CM

## 2012-05-18 DIAGNOSIS — T148XXA Other injury of unspecified body region, initial encounter: Secondary | ICD-10-CM

## 2012-05-21 ENCOUNTER — Telehealth: Payer: Self-pay | Admitting: General Practice

## 2012-05-21 NOTE — Telephone Encounter (Signed)
Spoke with patient and scheduled coumadin clinic visit on 9-23.

## 2012-05-24 ENCOUNTER — Telehealth: Payer: Self-pay | Admitting: Internal Medicine

## 2012-05-24 NOTE — Telephone Encounter (Signed)
[name] [name] is calling back because patient refuses to go to the ER and they want to be seen early in the morning before they are supposed to fly out of town, please advise

## 2012-05-24 NOTE — Telephone Encounter (Signed)
Called left message to call back 

## 2012-05-24 NOTE — Telephone Encounter (Signed)
Ok for tomorrow am, but should go to UC or ER if pain or swelling worse, or other symptoms such as fever

## 2012-05-24 NOTE — Telephone Encounter (Signed)
Caller: [name]/Patient; Patient Name: [name] [name]; PCP: Oliver Barre (Adults only); Best Callback Phone Number: [phone]; Call regarding Right Foot swelling with blue in Foot radiating up to Knee with Leg Pain, onset 9-18.  Patient scheduled to fly ou tof town on 9-20.  Patient's Coumadin was held and one dose Vit K given on 9-12 due to elevated INR. Patient is in no distess, need office appointment.  All emergent symptoms ruled out per Leg Non-injury protocol, emergency room disposition, Patient is ok to be seen in office.  No appointments within 4 hours with any MD. PLEASE CALL WIFE BACK FOR APPOINTMENT.

## 2012-05-24 NOTE — Telephone Encounter (Signed)
Called the patient informed of MD instructions. 

## 2012-05-24 NOTE — Telephone Encounter (Signed)
Patient's wife informed

## 2012-05-24 NOTE — Telephone Encounter (Signed)
Unfortunately unable to see at this time;  Please go directly now to ER to r/o worsening hematoma, DVT or worsening arterial circulation to the leg (? Compartment syndrome)

## 2012-05-28 ENCOUNTER — Ambulatory Visit (INDEPENDENT_AMBULATORY_CARE_PROVIDER_SITE_OTHER): Payer: Medicare Other | Admitting: General Practice

## 2012-05-28 ENCOUNTER — Ambulatory Visit (INDEPENDENT_AMBULATORY_CARE_PROVIDER_SITE_OTHER): Payer: Medicare Other | Admitting: Internal Medicine

## 2012-05-28 ENCOUNTER — Encounter: Payer: Self-pay | Admitting: Internal Medicine

## 2012-05-28 VITALS — BP 132/68 | HR 78 | Temp 97.7°F | Ht 69.0 in | Wt 174.0 lb

## 2012-05-28 DIAGNOSIS — T148XXA Other injury of unspecified body region, initial encounter: Secondary | ICD-10-CM

## 2012-05-28 DIAGNOSIS — L02419 Cutaneous abscess of limb, unspecified: Secondary | ICD-10-CM

## 2012-05-28 DIAGNOSIS — Z23 Encounter for immunization: Secondary | ICD-10-CM

## 2012-05-28 DIAGNOSIS — I4891 Unspecified atrial fibrillation: Secondary | ICD-10-CM

## 2012-05-28 DIAGNOSIS — L03115 Cellulitis of right lower limb: Secondary | ICD-10-CM | POA: Insufficient documentation

## 2012-05-28 DIAGNOSIS — Z7901 Long term (current) use of anticoagulants: Secondary | ICD-10-CM

## 2012-05-28 DIAGNOSIS — I10 Essential (primary) hypertension: Secondary | ICD-10-CM

## 2012-05-28 LAB — POCT INR: INR: 1.1

## 2012-05-28 MED ORDER — DOXYCYCLINE HYCLATE 100 MG PO TABS: 100.0000 mg | ORAL_TABLET | Freq: Two times a day (BID) | ORAL | Status: DC

## 2012-05-28 NOTE — Patient Instructions (Addendum)
Take all new medications as prescribed Continue all other medications as before, including the coumadin

## 2012-05-28 NOTE — Assessment & Plan Note (Signed)
Incidental, Mild to mod, for antibx course,  to f/u any worsening symptoms or concerns, due to minor trauma of leg on furniture recent

## 2012-05-28 NOTE — Assessment & Plan Note (Signed)
Agree with re-start coumadin

## 2012-05-28 NOTE — Patient Instructions (Signed)
Take 2 tablets today (Monday) and take 1 1/2 tablets on Tuesday and then continue to take 1 tablet everyday except 1 1/2 tablet on Monday and re-check in 1 week.

## 2012-05-28 NOTE — Assessment & Plan Note (Signed)
stable overall by hx and exam, most recent data reviewed with pt, and pt to continue medical treatment as before BP Readings from Last 3 Encounters:  05/28/12 132/68  05/17/12 110/60  03/25/12 122/84

## 2012-05-28 NOTE — Progress Notes (Signed)
Subjective:    Patient ID: CYRIL WOODMANSEE, male    DOB: [birth year], 78 y.o.   MRN: [id number]  HPI  Here to f/u with wife; overall right thigh has decreased in size and discomfort, but notes the bruising and swelling and progressed gradually to involve the distal leg below the leg; no worsening gait problem or claudication type pain;  Did stumble a bit 2 days ago with a scrape to the mid right anterior leg, now with mild red/warmth/tender without red streaks, ulcer, drainage or LA.  Pt denies chest pain, increased sob or doe, wheezing, orthopnea, PND, increased LE swelling, palpitations, dizziness or syncope.   Pt denies polydipsia, polyuria., Pt denies new neurological symptoms such as new headache, or facial or extremity weakness or numbness.  Pt denies fever, wt loss, night sweats, loss of appetite, or other constitutional symptoms.  INR 1.1 today, to re-start coumadin as planned Past Medical History  Diagnosis Date  . CAD (coronary artery disease)   . Hypertension   . Vitamin d deficiency   . Chronic back pain   . Thyroid disease   . Anemia   . ED (erectile dysfunction)   . Arthritis   . RLS (restless legs syndrome)   . GERD (gastroesophageal reflux disease)   . COPD (chronic obstructive pulmonary disease)   . Hyperlipidemia   . Atrial fibrillation   . Second degree Mobitz II AV block     s/p PPM  . Asthma 09/15/2011  . Long term current use of anticoagulant 09/15/2011  . Thrombocytopenia 09/15/2011   Past Surgical History  Procedure Date  . Coronary artery bypass graft   . Cataract extraction   . Pacemaker insertion     reports that he has quit smoking. He has never used smokeless tobacco. He reports that he drinks alcohol. He reports that he does not use illicit drugs. family history includes Diabetes in his sister. Allergies  Allergen Reactions  . Amoxicillin     REACTION: unspecified  . Penicillins    Current Outpatient Prescriptions on File Prior to Visit  Medication Sig  Dispense Refill  . albuterol (VENTOLIN HFA) 108 (90 BASE) MCG/ACT inhaler Inhale 2 puffs into the lungs every 6 (six) hours as needed.  1 Inhaler  11  . aspirin 81 MG tablet Take 81 mg by mouth daily.        . clonazePAM (KLONOPIN) 0.5 MG tablet take 1 to 2 tablets by mouth at bedtime if needed for sleep OR nerves  60 tablet  3  . EPINEPHrine (EPIPEN JR) 0.15 MG/0.3ML injection Inject 0.15 mg into the muscle as needed.        . isosorbide mononitrate (IMDUR) 30 MG 24 hr tablet take 1/2 tablet by mouth once daily  15 tablet  11  . metoprolol succinate (TOPROL-XL) 25 MG 24 hr tablet take 1 tablet by mouth once daily  30 tablet  11  . Multiple Vitamin (MULTIVITAMIN) tablet Take 1 tablet by mouth daily.       . pantoprazole (PROTONIX) 40 MG tablet Take 1 tablet (40 mg total) by mouth daily.  90 tablet  3  . rOPINIRole (REQUIP) 4 MG tablet take 1 tablet by mouth 1 HOUR BEFORE BEDTIME AND 1/2 TABLET EACH MORNING.  45 tablet  11  . simvastatin (ZOCOR) 10 MG tablet take 1 tablet by mouth at bedtime  90 tablet  3  . triamcinolone cream (KENALOG) 0.5 % Apply topically 3 (three) times daily.  30 g  3  .  warfarin (COUMADIN) 5 MG tablet USE AS DIRECTED BY ANTICOAGULATION CLINIC. 1 TABLET BY MOUTH EVERY EVENING EXCEPT TAKE 1 AND 1/2 TABS ON MONDAY, WEDNESDAY, AND FRIDAY.  40 tablet  11   Review of Systems  Constitutional: Negative for diaphoresis and unexpected weight change.  HENT: Negative for tinnitus.   Eyes: Negative for photophobia and visual disturbance.  Respiratory: Negative for choking and stridor.   Gastrointestinal: Negative for vomiting and blood in stool.  Genitourinary: Negative for hematuria and decreased urine volume.  Musculoskeletal: Negative for gait problem.  Skin: Negative for color change and wound.  Neurological: Negative for tremors and numbness.  Psychiatric/Behavioral: Negative for decreased concentration. The patient is not hyperactive.       Objective:   Physical Exam BP  132/68  Pulse 78  Temp 97.7 F (36.5 C) (Oral)  Ht 5\' 9"  (1.753 m)  Wt 174 lb (78.926 kg)  BMI 25.70 kg/m2  SpO2 95% Physical Exam  VS noted, not ill appaering Constitutional: Pt appears well-developed and well-nourished.  HENT: Head: Normocephalic.  Right Ear: External ear normal.  Left Ear: External ear normal.  Eyes: Conjunctivae and EOM are normal. Pupils are equal, round, and reactive to light.  Neck: Normal range of motion. Neck supple.  Cardiovascular: Normal rate and regular rhythm.   Pulmonary/Chest: Effort normal and breath sounds normal.  Neurological: Pt is alert. Not confused  Skin: with 4 cm area right mid pretibial leg red/tender/minor swelling, no ulcer or drainage Right thigh with mild decreased overall swelling/bruising; knee without effusion;  Leg below knee with 1-2+ diffuse swelling and bruising noted nontender scattered but diffuse toe area Right dorsalis pedis 1+      Assessment & Plan:

## 2012-05-28 NOTE — Assessment & Plan Note (Signed)
Improved overall, with gravity movement of blood more to distal leg, wife reassured, no compartment syndrome

## 2012-06-04 ENCOUNTER — Ambulatory Visit (INDEPENDENT_AMBULATORY_CARE_PROVIDER_SITE_OTHER): Payer: Medicare Other | Admitting: General Practice

## 2012-06-04 DIAGNOSIS — I4891 Unspecified atrial fibrillation: Secondary | ICD-10-CM

## 2012-06-14 ENCOUNTER — Telehealth: Payer: Self-pay | Admitting: General Practice

## 2012-06-15 ENCOUNTER — Telehealth: Payer: Self-pay | Admitting: General Practice

## 2012-06-15 NOTE — Telephone Encounter (Signed)
LMOM to call coumadin clinic to reschedule visit.  2nd attempt.

## 2012-06-15 NOTE — Telephone Encounter (Signed)
LMOM to call coumadin clinic to reschedule visit.  1st attempt.

## 2012-06-19 ENCOUNTER — Ambulatory Visit (INDEPENDENT_AMBULATORY_CARE_PROVIDER_SITE_OTHER): Payer: Medicare Other | Admitting: General Practice

## 2012-06-19 DIAGNOSIS — I4891 Unspecified atrial fibrillation: Secondary | ICD-10-CM

## 2012-07-02 ENCOUNTER — Other Ambulatory Visit: Payer: Self-pay | Admitting: Internal Medicine

## 2012-07-02 ENCOUNTER — Encounter: Payer: Self-pay | Admitting: Internal Medicine

## 2012-07-02 ENCOUNTER — Ambulatory Visit (INDEPENDENT_AMBULATORY_CARE_PROVIDER_SITE_OTHER): Payer: Medicare Other | Admitting: Internal Medicine

## 2012-07-02 ENCOUNTER — Other Ambulatory Visit (INDEPENDENT_AMBULATORY_CARE_PROVIDER_SITE_OTHER): Payer: Medicare Other

## 2012-07-02 ENCOUNTER — Ambulatory Visit (INDEPENDENT_AMBULATORY_CARE_PROVIDER_SITE_OTHER)
Admission: RE | Admit: 2012-07-02 | Discharge: 2012-07-02 | Disposition: A | Discharge status: Home / Self Care | Payer: Medicare Other | Source: Ambulatory Visit | Attending: Internal Medicine | Admitting: Internal Medicine

## 2012-07-02 VITALS — BP 110/60 | HR 62 | Temp 96.8°F | Ht 69.0 in | Wt 175.1 lb

## 2012-07-02 DIAGNOSIS — I10 Essential (primary) hypertension: Secondary | ICD-10-CM

## 2012-07-02 DIAGNOSIS — I714 Abdominal aortic aneurysm, without rupture: Secondary | ICD-10-CM

## 2012-07-02 DIAGNOSIS — F22 Delusional disorders: Secondary | ICD-10-CM

## 2012-07-02 DIAGNOSIS — M545 Low back pain: Secondary | ICD-10-CM

## 2012-07-02 DIAGNOSIS — R609 Edema, unspecified: Secondary | ICD-10-CM

## 2012-07-02 LAB — URINALYSIS, ROUTINE W REFLEX MICROSCOPIC
Bilirubin Urine: NEGATIVE
Leukocytes, UA: NEGATIVE
Nitrite: NEGATIVE
Specific Gravity, Urine: 1.025 (ref 1.000–1.030)
pH: 6 (ref 5.0–8.0)

## 2012-07-02 MED ORDER — TRAMADOL HCL 50 MG PO TABS: 50.0000 mg | ORAL_TABLET | Freq: Four times a day (QID) | ORAL | Status: DC | PRN

## 2012-07-02 MED ORDER — CYCLOBENZAPRINE HCL 5 MG PO TABS: 5.0000 mg | ORAL_TABLET | Freq: Three times a day (TID) | ORAL | Status: DC | PRN

## 2012-07-02 NOTE — Assessment & Plan Note (Signed)
stable overall by hx and exam, most recent data reviewed with pt, and pt to continue medical treatment as before BP Readings from Last 3 Encounters:  07/02/12 110/60  05/28/12 132/68  05/17/12 110/60

## 2012-07-02 NOTE — Assessment & Plan Note (Signed)
stable overall by hx and exam, most recent data reviewed with pt, and pt to continue medical treatment as before Lab Results  Component Value Date   WBC 13.2* 05/17/2012   HGB 11.9* 05/17/2012   HCT 35.8* 05/17/2012   PLT 91.0 Repeated and verified X2.* 05/17/2012   GLUCOSE 106* 05/17/2012   CHOL 131 03/14/2012   TRIG 88.0 03/14/2012   HDL 52.90 03/14/2012   LDLCALC 61 03/14/2012   ALT 20 05/17/2012   AST 31 05/17/2012   NA 142 05/17/2012   K 4.3 05/17/2012   CL 107 05/17/2012   CREATININE 1.2 05/17/2012   BUN 12 05/17/2012   CO2 28 05/17/2012   TSH 0.73 03/14/2012   PSA 3.15 11/25/2009   INR 2.2 06/19/2012

## 2012-07-02 NOTE — Progress Notes (Signed)
Subjective:    Patient ID: [name] [name], male    DOB: Dec 30, 1925, 78 y.o.   MRN: [id number]  HPI here with family, c/o 1 wks right LBP acutely after an episode of ETOH/inebriation, but denies fall but not clear per family.  Now has mod pain,  Constant, dull, "like a catch" but no bowel or bladder change, fever, wt loss,  worsening LE pain/numbness/weakness, gait change. No furhter ETOH use. Quit smoking long ago.  Pt denies chest pain, increased sob or doe, wheezing, orthopnea, PND, increased LE swelling, palpitations, dizziness or syncope.  Pt denies new neurological symptoms such as new headache, or facial or extremity weakness or numbness.  Pt denies polydipsia, polyuria. He is concerned about renal stone.  Denies urinary symptoms such as dysuria, frequency, urgency,or hematuria. Past Medical History  Diagnosis Date  . CAD (coronary artery disease)   . Hypertension   . Vitamin D deficiency   . Chronic back pain   . Thyroid disease   . Anemia   . ED (erectile dysfunction)   . Arthritis   . RLS (restless legs syndrome)   . GERD (gastroesophageal reflux disease)   . COPD (chronic obstructive pulmonary disease)   . Hyperlipidemia   . Atrial fibrillation   . Second degree Mobitz II AV block     s/p PPM  . Asthma 09/15/2011  . Long term current use of anticoagulant 09/15/2011  . Thrombocytopenia 09/15/2011   Past Surgical History  Procedure Date  . Coronary artery bypass graft   . Cataract extraction   . Pacemaker insertion     reports that he has quit smoking. He has never used smokeless tobacco. He reports that he drinks alcohol. He reports that he does not use illicit drugs. family history includes Diabetes in his sister. Allergies  Allergen Reactions  . Amoxicillin     REACTION: unspecified  . Penicillins    Current Outpatient Prescriptions on File Prior to Visit  Medication Sig Dispense Refill  . albuterol (VENTOLIN HFA) 108 (90 BASE) MCG/ACT inhaler Inhale 2 puffs into the  lungs every 6 (six) hours as needed.  1 Inhaler  11  . aspirin 81 MG tablet Take 81 mg by mouth daily.        . clonazePAM (KLONOPIN) 0.5 MG tablet take 1 to 2 tablets by mouth at bedtime if needed for sleep OR nerves  60 tablet  3  . doxycycline (VIBRA-TABS) 100 MG tablet Take 1 tablet (100 mg total) by mouth 2 (two) times daily.  20 tablet  0  . EPINEPHrine (EPIPEN JR) 0.15 MG/0.3ML injection Inject 0.15 mg into the muscle as needed.        . isosorbide mononitrate (IMDUR) 30 MG 24 hr tablet take 1/2 tablet by mouth once daily  15 tablet  11  . metoprolol succinate (TOPROL-XL) 25 MG 24 hr tablet take 1 tablet by mouth once daily  30 tablet  11  . Multiple Vitamin (MULTIVITAMIN) tablet Take 1 tablet by mouth daily.       . pantoprazole (PROTONIX) 40 MG tablet Take 1 tablet (40 mg total) by mouth daily.  90 tablet  3  . rOPINIRole (REQUIP) 4 MG tablet take 1 tablet by mouth 1 HOUR BEFORE BEDTIME AND 1/2 TABLET EACH MORNING.  45 tablet  11  . simvastatin (ZOCOR) 10 MG tablet take 1 tablet by mouth at bedtime  90 tablet  3  . triamcinolone cream (KENALOG) 0.5 % Apply topically 3 (three) times daily.  30 g  3  . warfarin (COUMADIN) 5 MG tablet USE AS DIRECTED BY ANTICOAGULATION CLINIC. 1 TABLET BY MOUTH EVERY EVENING EXCEPT TAKE 1 AND 1/2 TABS ON MONDAY, WEDNESDAY, AND FRIDAY.  40 tablet  11   Review of Systems  Constitutional: Negative for diaphoresis and unexpected weight change.  HENT: Negative for tinnitus.   Eyes: Negative for photophobia and visual disturbance.  Respiratory: Negative for choking and stridor.   Gastrointestinal: Negative for vomiting and blood in stool.  Genitourinary: Negative for hematuria and decreased urine volume.  Musculoskeletal: Negative for gait problem.  Skin: Negative for color change and wound.  Neurological: Negative for tremors and numbness.  Psychiatric/Behavioral: Negative for decreased concentration. The patient is not hyperactive.       Objective:    Physical Exam BP 110/60  Pulse 62  Temp 96.8 F (36 C) (Oral)  Ht 5\' 9"  (1.753 m)  Wt 175 lb 2 oz (79.436 kg)  BMI 25.86 kg/m2  SpO2 97% Physical Exam  VS noted Constitutional: Pt appears well-developed and well-nourished.  HENT: Head: Normocephalic.  Right Ear: External ear normal.  Left Ear: External ear normal.  Eyes: Conjunctivae and EOM are normal. Pupils are equal, round, and reactive to light.  Neck: Normal range of motion. Neck supple.  Cardiovascular: Normal rate and regular rhythm.   Pulmonary/Chest: Effort normal and breath sounds normal.  Abd:  Soft, NT, non-distended, + BS, no flank tender Spine, minimal midline lumbar tender, but marked right paravertebral spam/tender Neurological: Pt is alert. Not confused , motor/sens/dtr intact Skin: Skin is warm. No erythema.  Psychiatric: Pt behavior is normal. Thought content normal.     Assessment & Plan:

## 2012-07-02 NOTE — Assessment & Plan Note (Signed)
Suspect primarily msk spasm, neuro exam ok, for pain med/muscle relaxer prn, for film since not clear if he could have fallen

## 2012-07-02 NOTE — Patient Instructions (Addendum)
Take all new medications as prescribed Continue all other medications as before Please go to XRAY in the Basement for the x-ray test You will be contacted by phone if any changes need to be made immediately.  Otherwise, you will receive a letter about your results with an explanation. Please remember to sign up for My Chart at your earliest convenience, as this will be important to you in the future with finding out test results. Remember, you should not take advil (ibuprofen) or alleve type medications, since you are on the Coumadin

## 2012-07-03 ENCOUNTER — Other Ambulatory Visit: Payer: Self-pay

## 2012-07-10 ENCOUNTER — Encounter: Payer: Self-pay | Admitting: Neurosurgery

## 2012-07-10 ENCOUNTER — Ambulatory Visit (INDEPENDENT_AMBULATORY_CARE_PROVIDER_SITE_OTHER): Payer: Medicare Other | Admitting: General Practice

## 2012-07-10 DIAGNOSIS — I4891 Unspecified atrial fibrillation: Secondary | ICD-10-CM

## 2012-07-11 ENCOUNTER — Encounter: Payer: Self-pay | Admitting: Cardiology

## 2012-07-11 ENCOUNTER — Ambulatory Visit (INDEPENDENT_AMBULATORY_CARE_PROVIDER_SITE_OTHER): Payer: Medicare Other | Admitting: Neurosurgery

## 2012-07-11 ENCOUNTER — Ambulatory Visit: Payer: Medicare Other

## 2012-07-11 ENCOUNTER — Encounter: Payer: Self-pay | Admitting: Neurosurgery

## 2012-07-11 ENCOUNTER — Ambulatory Visit (INDEPENDENT_AMBULATORY_CARE_PROVIDER_SITE_OTHER): Payer: Medicare Other | Admitting: Cardiology

## 2012-07-11 ENCOUNTER — Encounter (INDEPENDENT_AMBULATORY_CARE_PROVIDER_SITE_OTHER): Payer: Medicare Other | Admitting: *Deleted

## 2012-07-11 VITALS — BP 118/76 | HR 60 | Ht 69.0 in

## 2012-07-11 VITALS — BP 112/63 | HR 61 | Resp 16 | Ht 69.0 in | Wt 178.6 lb

## 2012-07-11 DIAGNOSIS — I441 Atrioventricular block, second degree: Secondary | ICD-10-CM

## 2012-07-11 DIAGNOSIS — I714 Abdominal aortic aneurysm, without rupture: Secondary | ICD-10-CM

## 2012-07-11 DIAGNOSIS — E785 Hyperlipidemia, unspecified: Secondary | ICD-10-CM

## 2012-07-11 DIAGNOSIS — I10 Essential (primary) hypertension: Secondary | ICD-10-CM

## 2012-07-11 DIAGNOSIS — D72829 Elevated white blood cell count, unspecified: Secondary | ICD-10-CM

## 2012-07-11 DIAGNOSIS — I4891 Unspecified atrial fibrillation: Secondary | ICD-10-CM

## 2012-07-11 DIAGNOSIS — I25810 Atherosclerosis of coronary artery bypass graft(s) without angina pectoris: Secondary | ICD-10-CM

## 2012-07-11 DIAGNOSIS — I2510 Atherosclerotic heart disease of native coronary artery without angina pectoris: Secondary | ICD-10-CM

## 2012-07-11 LAB — CBC WITH DIFFERENTIAL/PLATELET
Basophils Absolute: 0 10*3/uL (ref 0.0–0.1)
Basophils Relative: 0.4 % (ref 0.0–3.0)
Eosinophils Absolute: 0.2 10*3/uL (ref 0.0–0.7)
Eosinophils Relative: 2.2 % (ref 0.0–5.0)
HCT: 38.6 % — ABNORMAL LOW (ref 39.0–52.0)
Hemoglobin: 12.6 g/dL — ABNORMAL LOW (ref 13.0–17.0)
Lymphocytes Relative: 73.8 % — ABNORMAL HIGH (ref 12.0–46.0)
Lymphs Abs: 7.3 10*3/uL — ABNORMAL HIGH (ref 0.7–4.0)
MCHC: 32.5 g/dL (ref 30.0–36.0)
MCV: 95.3 fl (ref 78.0–100.0)
Monocytes Absolute: 0.5 10*3/uL (ref 0.1–1.0)
Monocytes Relative: 4.8 % (ref 3.0–12.0)
Neutro Abs: 1.9 10*3/uL (ref 1.4–7.7)
Neutrophils Relative %: 18.8 % — ABNORMAL LOW (ref 43.0–77.0)
Platelets: 81 10*3/uL — ABNORMAL LOW (ref 150.0–400.0)
RBC: 4.05 Mil/uL — ABNORMAL LOW (ref 4.22–5.81)
RDW: 13.6 % (ref 11.5–14.6)
WBC: 9.9 10*3/uL (ref 4.5–10.5)

## 2012-07-11 LAB — BASIC METABOLIC PANEL
BUN: 10 mg/dL (ref 6–23)
CO2: 32 mEq/L (ref 19–32)
Calcium: 9 mg/dL (ref 8.4–10.5)
Chloride: 106 mEq/L (ref 96–112)
Creatinine, Ser: 1.2 mg/dL (ref 0.4–1.5)
GFR: 63.63 mL/min (ref >60.00)
Glucose, Bld: 72 mg/dL (ref 70–99)
Potassium: 4.2 mEq/L (ref 3.5–5.1)
Sodium: 141 mEq/L (ref 135–145)

## 2012-07-11 NOTE — Assessment & Plan Note (Signed)
CHADS risk approx 9.8% per year CVA.

## 2012-07-11 NOTE — Assessment & Plan Note (Signed)
Saw Dr. Earlier earlier today.  Would not be a very good open repair candidate.

## 2012-07-11 NOTE — Patient Instructions (Signed)
Your physician recommends that you have lab work today: BMP and CBC  Your physician recommends that you schedule a follow-up appointment in: February 2014  Your physician recommends that you continue on your current medications as directed. Please refer to the Current Medication list given to you today.

## 2012-07-11 NOTE — Progress Notes (Signed)
HPI:  This is in for followup. He's had a little bit of flank pain since he fell on night. His significant other came home in the afternoon, patient had had too much to drink according to account that she provided. He also had a shoulder. Prior to that he had had a hematoma and leg. He now is slowly getting better. He was thought that he had some laboratory studies that time, but in actual fact been really done is scheduled to see Dr. Johney Frame next week for pacer follow. He's not having chest pain. He is not smoking. Apparently, his memory is also not as good as it was.  There was also a suggestion that his anuerysm was bigger, and that was assessed earlier today by Dr. Arbie Cookey.    Current Outpatient Prescriptions  Medication Sig Dispense Refill  . albuterol (VENTOLIN HFA) 108 (90 BASE) MCG/ACT inhaler Inhale 2 puffs into the lungs every 6 (six) hours as needed.  1 Inhaler  11  . aspirin 81 MG tablet Take 81 mg by mouth daily.        . clonazePAM (KLONOPIN) 0.5 MG tablet take 1 to 2 tablets by mouth at bedtime if needed for sleep OR nerves  60 tablet  3  . cyclobenzaprine (FLEXERIL) 5 MG tablet Take 1 tablet (5 mg total) by mouth 3 (three) times daily as needed for muscle spasms.  60 tablet  1  . doxycycline (VIBRA-TABS) 100 MG tablet Take 1 tablet (100 mg total) by mouth 2 (two) times daily.  20 tablet  0  . EPINEPHrine (EPIPEN JR) 0.15 MG/0.3ML injection Inject 0.15 mg into the muscle as needed.        . isosorbide mononitrate (IMDUR) 30 MG 24 hr tablet take 1/2 tablet by mouth once daily  15 tablet  11  . metoprolol succinate (TOPROL-XL) 25 MG 24 hr tablet take 1 tablet by mouth once daily  30 tablet  11  . Multiple Vitamin (MULTIVITAMIN) tablet Take 1 tablet by mouth daily.       . pantoprazole (PROTONIX) 40 MG tablet Take 1 tablet (40 mg total) by mouth daily.  90 tablet  3  . rOPINIRole (REQUIP) 4 MG tablet take 1 tablet by mouth 1 HOUR BEFORE BEDTIME AND 1/2 TABLET EACH MORNING.  45 tablet  11    . simvastatin (ZOCOR) 10 MG tablet take 1 tablet by mouth at bedtime  90 tablet  3  . traMADol (ULTRAM) 50 MG tablet Take 1 tablet (50 mg total) by mouth every 6 (six) hours as needed for pain.  60 tablet  1  . warfarin (COUMADIN) 5 MG tablet USE AS DIRECTED BY ANTICOAGULATION CLINIC. 1 TABLET BY MOUTH EVERY EVENING EXCEPT TAKE 1 AND 1/2 TABS ON MONDAY, WEDNESDAY, AND FRIDAY.  40 tablet  11    Allergies  Allergen Reactions  . Amoxicillin Itching and Rash    REACTION: unspecified  . Penicillins Itching and Rash    Past Medical History  Diagnosis Date  . CAD (coronary artery disease)   . Hypertension   . Vitamin D deficiency   . Chronic back pain   . Thyroid disease   . Anemia   . ED (erectile dysfunction)   . Arthritis   . RLS (restless legs syndrome)   . GERD (gastroesophageal reflux disease)   . COPD (chronic obstructive pulmonary disease)   . Hyperlipidemia   . Atrial fibrillation   . Second degree Mobitz II AV block  s/p PPM  . Asthma 09/15/2011  . Long term current use of anticoagulant 09/15/2011  . Thrombocytopenia 09/15/2011    Past Surgical History  Procedure Date  . Coronary artery bypass graft   . Cataract extraction   . Pacemaker insertion     Family History  Problem Relation Age of Onset  . Diabetes Sister     History   Social History  . Marital Status: Widowed    Spouse Name: N/A    Number of Children: N/A  . Years of Education: N/A   Occupational History  . Not on file.   Social History Main Topics  . Smoking status: Former Games developer  . Smokeless tobacco: Never Used  . Alcohol Use: Yes  . Drug Use: No  . Sexually Active: Not Currently   Other Topics Concern  . Not on file   Social History Narrative  . No narrative on file    ROS: Please see the HPI.  All other systems reviewed and negative.  PHYSICAL EXAM:  BP 118/76  Pulse 60  Ht 5\' 9"  (1.753 m)  SpO2 99%  General: Well developed, well nourished, in no acute  distress. Head:  Normocephalic and atraumatic. Neck: no JVD Lungs: decrease BS bilaterally with some expiratory prolongation. Minimal ronchii.   Heart: Some irregularity.    Abdomen:  Normal bowel sounds; soft; non tender; no organomegaly.  Bruise around R flank.  Mild bruising in the leg.  Some ecymossis on the r shoulder.   Pulses: Decrease pulses in the LE bilaterally.   Extremities: No clubbing or cyanosis. No edema. Neurologic: Alert and oriented x 3.  EKG:  Atrial pacing, with pacs, then atrial tracking  (for interrogation next week).  ASSESSMENT AND PLAN:  His untreated risk score is high.  He also is at risk for complications if he is drinking.  We were discussing this today.

## 2012-07-11 NOTE — Addendum Note (Signed)
Addended by: Sharee Pimple on: 07/11/2012 01:14 PM   Modules accepted: Orders

## 2012-07-11 NOTE — Assessment & Plan Note (Signed)
S/p pacer placement 

## 2012-07-11 NOTE — Assessment & Plan Note (Signed)
Currently controlled.

## 2012-07-11 NOTE — Assessment & Plan Note (Signed)
At target last assessment.

## 2012-07-11 NOTE — Assessment & Plan Note (Signed)
No current chest pain, remains on ASA for prior stent in SVG.

## 2012-07-11 NOTE — Progress Notes (Signed)
[facility]/PAD/PVD Office Note  CC: AAA surveillance Referring Physician: Early  History of Present Illness: 78 year old male patient of Dr. Arbie Cookey is with a known AAA. The patient denies any unusual back or abdominal pain, any new medical diagnoses or recent surgery.  Past Medical History  Diagnosis Date  . CAD (coronary artery disease)   . Hypertension   . Vitamin D deficiency   . Chronic back pain   . Thyroid disease   . Anemia   . ED (erectile dysfunction)   . Arthritis   . RLS (restless legs syndrome)   . GERD (gastroesophageal reflux disease)   . COPD (chronic obstructive pulmonary disease)   . Hyperlipidemia   . Atrial fibrillation   . Second degree Mobitz II AV block     s/p PPM  . Asthma 09/15/2011  . Long term current use of anticoagulant 09/15/2011  . Thrombocytopenia 09/15/2011    ROS: [x]  Positive   [ ]  Denies    General: [ ]  Weight loss, [ ]  Fever, [ ]  chills Neurologic: [ ]  Dizziness, [ ]  Blackouts, [ ]  Seizure [ ]  Stroke, [ ]  "Mini stroke", [ ]  Slurred speech, [ ]  Temporary blindness; [ ]  weakness in arms or legs, [ ]  Hoarseness Cardiac: [ ]  Chest pain/pressure, [ ]  Shortness of breath at rest [ ]  Shortness of breath with exertion, [ ]  Atrial fibrillation or irregular heartbeat Vascular: [ ]  Pain in legs with walking, [ ]  Pain in legs at rest, [ ]  Pain in legs at night,  [ ]  Non-healing ulcer, [ ]  Blood clot in vein/DVT,   Pulmonary: [ ]  Home oxygen, [ ]  Productive cough, [ ]  Coughing up blood, [ ]  Asthma,  [ ]  Wheezing Musculoskeletal:  [ ]  Arthritis, [ ]  Low back pain, [ ]  Joint pain Hematologic: [ ]  Easy Bruising, [ ]  Anemia; [ ]  Hepatitis Gastrointestinal: [ ]  Blood in stool, [ ]  Gastroesophageal Reflux/heartburn, [ ]  Trouble swallowing Urinary: [ ]  chronic Kidney disease, [ ]  on HD - [ ]  MWF or [ ]  TTHS, [ ]  Burning with urination, [ ]  Difficulty urinating Skin: [ ]  Rashes, [ ]  Wounds Psychological: [ ]  Anxiety, [ ]   Depression   Social History History  Substance Use Topics  . Smoking status: Former Games developer  . Smokeless tobacco: Never Used  . Alcohol Use: Yes    Family History Family History  Problem Relation Age of Onset  . Diabetes Sister     Allergies  Allergen Reactions  . Amoxicillin Itching and Rash    REACTION: unspecified  . Penicillins Itching and Rash    Current Outpatient Prescriptions  Medication Sig Dispense Refill  . albuterol (VENTOLIN HFA) 108 (90 BASE) MCG/ACT inhaler Inhale 2 puffs into the lungs every 6 (six) hours as needed.  1 Inhaler  11  . aspirin 81 MG tablet Take 81 mg by mouth daily.        . clonazePAM (KLONOPIN) 0.5 MG tablet take 1 to 2 tablets by mouth at bedtime if needed for sleep OR nerves  60 tablet  3  . cyclobenzaprine (FLEXERIL) 5 MG tablet Take 1 tablet (5 mg total) by mouth 3 (three) times daily as needed for muscle spasms.  60 tablet  1  . doxycycline (VIBRA-TABS) 100 MG tablet Take 1 tablet (100 mg total) by mouth 2 (two) times daily.  20 tablet  0  . EPINEPHrine (EPIPEN JR) 0.15 MG/0.3ML injection Inject 0.15 mg into the muscle  as needed.        . isosorbide mononitrate (IMDUR) 30 MG 24 hr tablet take 1/2 tablet by mouth once daily  15 tablet  11  . metoprolol succinate (TOPROL-XL) 25 MG 24 hr tablet take 1 tablet by mouth once daily  30 tablet  11  . Multiple Vitamin (MULTIVITAMIN) tablet Take 1 tablet by mouth daily.       . pantoprazole (PROTONIX) 40 MG tablet Take 1 tablet (40 mg total) by mouth daily.  90 tablet  3  . rOPINIRole (REQUIP) 4 MG tablet take 1 tablet by mouth 1 HOUR BEFORE BEDTIME AND 1/2 TABLET EACH MORNING.  45 tablet  11  . simvastatin (ZOCOR) 10 MG tablet take 1 tablet by mouth at bedtime  90 tablet  3  . traMADol (ULTRAM) 50 MG tablet Take 1 tablet (50 mg total) by mouth every 6 (six) hours as needed for pain.  60 tablet  1  . triamcinolone cream (KENALOG) 0.5 % Apply topically 3 (three) times daily.  30 g  3  . warfarin  (COUMADIN) 5 MG tablet USE AS DIRECTED BY ANTICOAGULATION CLINIC. 1 TABLET BY MOUTH EVERY EVENING EXCEPT TAKE 1 AND 1/2 TABS ON MONDAY, WEDNESDAY, AND FRIDAY.  40 tablet  11    Physical Examination  Filed Vitals:   07/11/12 1023  BP: 112/63  Pulse: 61  Resp: 16    Body mass index is 26.37 kg/(m^2).  General:  WDWN in NAD Gait: Normal HEENT: WNL Eyes: Pupils equal Pulmonary: normal non-labored breathing , without Rales, rhonchi,  wheezing Cardiac: RRR, without  Murmurs, rubs or gallops; No carotid bruits Abdomen: soft, NT, no masses Skin: no rashes, ulcers noted Vascular Exam/Pulses: Palpable femoral pulses bilaterally, there is a small abdominal mass palpated is nontender  Extremities without ischemic changes, no Gangrene , no cellulitis; no open wounds;  Musculoskeletal: no muscle wasting or atrophy  Neurologic: A&O X 3; Appropriate Affect ; SENSATION: normal; MOTOR FUNCTION:  moving all extremities equally. Speech is fluent/normal  Non-Invasive Vascular Imaging: Maximum AAA duplex diameter today is 4.7 x 4.8 which is a slight increase from July 2013 when he was 4.5 x 4.6  ASSESSMENT/PLAN: the patient will followup in 6 months with repeat AAA duplex. The patient knows the signs of rupture and knows to call 911 in this event.The patients questions were encouraged and answered, he is in agreement with this plan.  Lauree Chandler ANP  Clinic M.D.: Edilia Bo

## 2012-07-12 LAB — PATHOLOGIST SMEAR REVIEW

## 2012-07-26 ENCOUNTER — Ambulatory Visit (INDEPENDENT_AMBULATORY_CARE_PROVIDER_SITE_OTHER): Payer: Medicare Other | Admitting: Internal Medicine

## 2012-07-26 ENCOUNTER — Encounter: Payer: Self-pay | Admitting: Internal Medicine

## 2012-07-26 VITALS — BP 124/68 | HR 71 | Ht 70.8 in | Wt 176.0 lb

## 2012-07-26 DIAGNOSIS — I441 Atrioventricular block, second degree: Secondary | ICD-10-CM

## 2012-07-26 DIAGNOSIS — I4891 Unspecified atrial fibrillation: Secondary | ICD-10-CM

## 2012-07-26 LAB — PACEMAKER DEVICE OBSERVATION
AL AMPLITUDE: 3.6 mv
AL IMPEDENCE PM: 400 Ohm
BATTERY VOLTAGE: 2.92 V
RV LEAD AMPLITUDE: 6.1 mv
RV LEAD IMPEDENCE PM: 430 Ohm
VENTRICULAR PACING PM: 53

## 2012-07-26 NOTE — Progress Notes (Signed)
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 male who presents today for routine electrophysiology followup.  Since last being seen in our clinic, the patient reports doing very well.  Today, he denies symptoms of palpitations, chest pain,  lower extremity edema, dizziness, presyncope, or syncope.  He has chronic lung disease which continues to be an issue.  The patient is otherwise without complaint today.   Past Medical History  Diagnosis Date  . CAD (coronary artery disease)   . Hypertension   . Vitamin D deficiency   . Chronic back pain   . Thyroid disease   . Anemia   . ED (erectile dysfunction)   . Arthritis   . RLS (restless legs syndrome)   . GERD (gastroesophageal reflux disease)   . COPD (chronic obstructive pulmonary disease)   . Hyperlipidemia   . Atrial fibrillation   . Second degree Mobitz II AV block     s/p PPM  . Asthma 09/15/2011  . Long term current use of anticoagulant 09/15/2011  . Thrombocytopenia 09/15/2011   Past Surgical History  Procedure Date  . Coronary artery bypass graft   . Cataract extraction   . Pacemaker insertion     Current Outpatient Prescriptions  Medication Sig Dispense Refill  . albuterol (VENTOLIN HFA) 108 (90 BASE) MCG/ACT inhaler Inhale 2 puffs into the lungs every 6 (six) hours as needed.  1 Inhaler  11  . aspirin 81 MG tablet Take 81 mg by mouth daily.        . clonazePAM (KLONOPIN) 0.5 MG tablet take 1 to 2 tablets by mouth at bedtime if needed for sleep OR nerves  60 tablet  3  . cyclobenzaprine (FLEXERIL) 5 MG tablet Take 1 tablet (5 mg total) by mouth 3 (three) times daily as needed for muscle spasms.  60 tablet  1  . EPINEPHrine (EPIPEN JR) 0.15 MG/0.3ML injection Inject 0.15 mg into the muscle as needed.        . isosorbide mononitrate (IMDUR) 30 MG 24 hr tablet take 1/2 tablet by mouth once daily  15 tablet  11  . metoprolol succinate (TOPROL-XL) 25 MG 24 hr tablet take 1 tablet by mouth  once daily  30 tablet  11  . Multiple Vitamin (MULTIVITAMIN) tablet Take 1 tablet by mouth daily.       . pantoprazole (PROTONIX) 40 MG tablet Take 1 tablet (40 mg total) by mouth daily.  90 tablet  3  . rOPINIRole (REQUIP) 4 MG tablet take 1 tablet by mouth 1 HOUR BEFORE BEDTIME AND 1/2 TABLET EACH MORNING.  45 tablet  11  . simvastatin (ZOCOR) 10 MG tablet take 1 tablet by mouth at bedtime  90 tablet  3  . traMADol (ULTRAM) 50 MG tablet Take 1 tablet (50 mg total) by mouth every 6 (six) hours as needed for pain.  60 tablet  1  . warfarin (COUMADIN) 5 MG tablet USE AS DIRECTED BY ANTICOAGULATION CLINIC. 1 TABLET BY MOUTH EVERY EVENING EXCEPT TAKE 1 AND 1/2 TABS ON MONDAY, WEDNESDAY, AND FRIDAY.  40 tablet  11    Physical Exam: Filed Vitals:   07/26/12 1214  BP: 124/68  Pulse: 71  Height: 5' 10.8" (1.798 m)  Weight: 176 lb (79.833 kg)  SpO2: 99%    GEN- The patient is well appearing, alert and oriented x 3 today.   Head- normocephalic, atraumatic Eyes-  Sclera clear, conjunctiva pink Ears- hearing intact Oropharynx-  clear Lungs- prolonged expiratory phase, + expiratory wheezes Chest- pacemaker pocket is well healed Heart- Regular rate and rhythm, no murmurs, rubs or gallops, PMI not laterally displaced GI- soft, NT, ND, + BS Extremities- no clubbing, cyanosis, or edema  Pacemaker interrogation- reviewed in detail today,  See PACEART report  Assessment and Plan:  MOBITZ II ATRIOVENTRICULAR BLOCK  Normal pacemaker function, though RV threshold is chronically elevated with low R waves (stable).  Due to ventricular noise with unipolar sensing, I have reprogrammed back to bipolar today See Pace Art report  No changes today   ATRIAL FIBRILLATION, PAROXYSMAL - Hillis Range, MD 07/21/2011 5:38 PM Signed  Continue coumadin long term

## 2012-07-26 NOTE — Patient Instructions (Addendum)
Remote monitoring is used to monitor your Pacemaker of ICD from home. This monitoring reduces the number of office visits required to check your device to one time per year. It allows [place] to keep an eye on the functioning of your device to ensure it is working properly. You are scheduled for a device check from home on October 29, 2012. You may send your transmission at any time that day. If you have a wireless device, the transmission will be sent automatically. After your physician reviews your transmission, you will receive a postcard with your next transmission date.  Your physician wants you to follow-up in: 1 year with Dr Johney Frame.  You will receive a reminder letter in the mail two months in advance. If you don't receive a letter, please call our office to schedule the follow-up appointment.

## 2012-07-31 ENCOUNTER — Telehealth: Payer: Self-pay | Admitting: Internal Medicine

## 2012-07-31 MED ORDER — AZELASTINE HCL 0.1 % NA SOLN: 1.0000 | Freq: Two times a day (BID) | NASAL | Status: DC

## 2012-07-31 NOTE — Telephone Encounter (Signed)
Done erx  Should see regina if not improved

## 2012-07-31 NOTE — Telephone Encounter (Signed)
Patient Information:  Caller Name: [name] [name]  Phone: [phone]  Patient: [name] [name]  Gender: Male  DOB: Oct 07, 1925  Age: 78 Years  PCP: Oliver Barre (Adults only)   Symptoms  Reason For Call & Symptoms: Sinus drainage,  Reviewed Health History In EMR: Yes  Reviewed Medications In EMR: Yes  Reviewed Allergies In EMR: Yes  Date of Onset of Symptoms: 07/29/2012  Treatments Tried: Claritin daily, and saline spray.  Treatments Tried Worked: No  Guideline(s) Used:  Hay Fever - Nasal Allergies  Disposition Per Guideline:   Home Care  Reason For Disposition Reached:   Nasal allergies occur only certain times of year  Advice Given:  For a Stuffy Nose - Use Nasal Washes:  Introduction  How it Helps:  Antihistamine Medications for Hay Fever:  Antihistamines help reduce sneezing, itching, and runny nose.  Loratadine  Call Back If:  You become worse  Office Follow Up:  Does the office need to follow up with this patient?: Yes  Instructions For The Office: Please follow up on script request.  See patient notes.  Patient Refused Recommendation:  Patient Requests Prescription  Would like a prescription for Astepro nasal spray  RN Note:  Sinus drainage thin and clear that is a constant drip also has watery eyes.  Patient states that he spoke with the Pharmacist and was instructed to call in to office and request Astepro nasal spray to treat symptoms.  Patient would like to know if this is recommended.

## 2012-08-01 NOTE — Telephone Encounter (Signed)
Patient's wife informed

## 2012-08-14 ENCOUNTER — Ambulatory Visit (INDEPENDENT_AMBULATORY_CARE_PROVIDER_SITE_OTHER): Payer: Medicare Other | Admitting: General Practice

## 2012-08-14 ENCOUNTER — Encounter: Payer: Self-pay | Admitting: Internal Medicine

## 2012-08-14 ENCOUNTER — Ambulatory Visit (INDEPENDENT_AMBULATORY_CARE_PROVIDER_SITE_OTHER): Payer: Medicare Other | Admitting: Internal Medicine

## 2012-08-14 VITALS — BP 114/58 | HR 68 | Temp 97.6°F | Resp 16 | Wt 176.1 lb

## 2012-08-14 DIAGNOSIS — I10 Essential (primary) hypertension: Secondary | ICD-10-CM

## 2012-08-14 DIAGNOSIS — J45901 Unspecified asthma with (acute) exacerbation: Secondary | ICD-10-CM

## 2012-08-14 DIAGNOSIS — I4891 Unspecified atrial fibrillation: Secondary | ICD-10-CM

## 2012-08-14 DIAGNOSIS — F0390 Unspecified dementia without behavioral disturbance: Secondary | ICD-10-CM

## 2012-08-14 MED ORDER — PREDNISONE 10 MG PO TABS: ORAL_TABLET | ORAL | Status: DC

## 2012-08-14 MED ORDER — METHYLPREDNISOLONE ACETATE 80 MG/ML IJ SUSP
120.0000 mg | Freq: Once | INTRAMUSCULAR | Status: AC
  Administered 2012-08-14: 120 mg via INTRAMUSCULAR

## 2012-08-14 MED ORDER — DONEPEZIL HCL 5 MG PO TABS: 5.0000 mg | ORAL_TABLET | Freq: Every evening | ORAL | Status: DC | PRN

## 2012-08-14 NOTE — Assessment & Plan Note (Signed)
prob multiinfarct type +/= alzheimers, for aricept 5 qd

## 2012-08-14 NOTE — Patient Instructions (Addendum)
You had the steroid shot today Take all new medications as prescribed - the prednisone, and starting the Aricept 5 mg per day Please also use the Advair 250/50 at 1 puff twice per day (sample) until finished (2 weeks) Please keep your appointments with your specialists as you have planned Continue all other medications as before

## 2012-08-14 NOTE — Assessment & Plan Note (Signed)
stable overall by hx and exam, most recent data reviewed with pt, and pt to continue medical treatment as before BP Readings from Last 3 Encounters:  08/14/12 114/58  07/26/12 124/68  07/11/12 118/76

## 2012-08-14 NOTE — Progress Notes (Signed)
Subjective:    Patient ID: [name] [name], male    DOB: [birth year], 78 y.o.   MRN: [id number]  HPI  Here with wife, c/o gradual onset worsening mild to mod wheezing/chest tightness/sob every fall when he rakes the leaves outside, Pt denies chest pain, orthopnea, PND, increased LE swelling, palpitations, dizziness or syncope.  Pt denies new neurological symptoms such as new headache, or facial or extremity weakness or numbness   Pt denies polydipsia, polyuria.  Pt denies fever, wt loss, night sweats, loss of appetite, or other constitutional symptoms.  Does have worsening ST memory loss in the past yr as well, last head CT 2010 with mult small CVA.   Still drinking 2 double shots per day minimum Past Medical History  Diagnosis Date  . CAD (coronary artery disease)   . Hypertension   . Vitamin D deficiency   . Chronic back pain   . Thyroid disease   . Anemia   . ED (erectile dysfunction)   . Arthritis   . RLS (restless legs syndrome)   . GERD (gastroesophageal reflux disease)   . COPD (chronic obstructive pulmonary disease)   . Hyperlipidemia   . Atrial fibrillation   . Second degree Mobitz II AV block     s/p PPM  . Asthma 09/15/2011  . Long term current use of anticoagulant 09/15/2011  . Thrombocytopenia 09/15/2011   Past Surgical History  Procedure Date  . Coronary artery bypass graft   . Cataract extraction   . Pacemaker insertion     reports that he has quit smoking. He has never used smokeless tobacco. He reports that he drinks alcohol. He reports that he does not use illicit drugs. family history includes Diabetes in his sister. Allergies  Allergen Reactions  . Amoxicillin Itching and Rash    REACTION: unspecified  . Penicillins Itching and Rash   Current Outpatient Prescriptions on File Prior to Visit  Medication Sig Dispense Refill  . albuterol (VENTOLIN HFA) 108 (90 BASE) MCG/ACT inhaler Inhale 2 puffs into the lungs every 6 (six) hours as needed.  1 Inhaler  11  .  aspirin 81 MG tablet Take 81 mg by mouth daily.        Marland Kitchen azelastine (ASTEPRO) 137 MCG/SPRAY nasal spray Place 1 spray into the nose 2 (two) times daily. Use in each nostril as directed  30 mL  12  . clonazePAM (KLONOPIN) 0.5 MG tablet take 1 to 2 tablets by mouth at bedtime if needed for sleep OR nerves  60 tablet  3  . EPINEPHrine (EPIPEN JR) 0.15 MG/0.3ML injection Inject 0.15 mg into the muscle as needed.        . isosorbide mononitrate (IMDUR) 30 MG 24 hr tablet take 1/2 tablet by mouth once daily  15 tablet  11  . metoprolol succinate (TOPROL-XL) 25 MG 24 hr tablet take 1 tablet by mouth once daily  30 tablet  11  . Multiple Vitamin (MULTIVITAMIN) tablet Take 1 tablet by mouth daily.       . pantoprazole (PROTONIX) 40 MG tablet Take 1 tablet (40 mg total) by mouth daily.  90 tablet  3  . rOPINIRole (REQUIP) 4 MG tablet take 1 tablet by mouth 1 HOUR BEFORE BEDTIME AND 1/2 TABLET EACH MORNING.  45 tablet  11  . simvastatin (ZOCOR) 10 MG tablet take 1 tablet by mouth at bedtime  90 tablet  3  . warfarin (COUMADIN) 5 MG tablet USE AS DIRECTED BY ANTICOAGULATION CLINIC. 1 TABLET  BY MOUTH EVERY EVENING EXCEPT TAKE 1 AND 1/2 TABS ON MONDAY, WEDNESDAY, AND FRIDAY.  40 tablet  11  . donepezil (ARICEPT) 5 MG tablet Take 1 tablet (5 mg total) by mouth at bedtime as needed.  90 tablet  3   No current facility-administered medications on file prior to visit.   Review of Systems  Constitutional: Negative for diaphoresis and unexpected weight change.  HENT: Negative for tinnitus.   Eyes: Negative for photophobia and visual disturbance.  Respiratory: Negative for choking and stridor.   Gastrointestinal: Negative for vomiting and blood in stool.  Genitourinary: Negative for hematuria and decreased urine volume.  Musculoskeletal: Negative for gait problem.  Skin: Negative for color change and wound.  Neurological: Negative for tremors and numbness.  Psychiatric/Behavioral: Negative for decreased  concentration. The patient is not hyperactive.       Objective:   Physical Exam BP 114/58  Pulse 68  Temp 97.6 F (36.4 C) (Oral)  Resp 16  Wt 176 lb 1.3 oz (79.869 kg)  SpO2 98% Physical Exam  VS noted Constitutional: Pt appears well-developed and well-nourished.  HENT: Head: Normocephalic.  Right Ear: External ear normal.  Left Ear: External ear normal.  Eyes: Conjunctivae and EOM are normal. Pupils are equal, round, and reactive to light.  Neck: Normal range of motion. Neck supple.  Cardiovascular: Normal rate and regular rhythm.   Pulmonary/Chest: Effort normal and breath sounds decreased bilat, few wheeze bilat, no rales  Abd:  Soft, NT, non-distended, + BS Neurological: Pt is alert.Confused c/w at least mild dementia Skin: Skin is warm. No erythema.  Psychiatric: Pt behavior is normal.     Assessment & Plan:

## 2012-08-14 NOTE — Assessment & Plan Note (Signed)
Mild to mod, for depomedrol IM, predpack asd, advair sample 250/50 for 2 wks use then trial off,  to f/u any worsening symptoms or concerns

## 2012-09-05 ENCOUNTER — Encounter (HOSPITAL_COMMUNITY): Payer: Self-pay | Admitting: Emergency Medicine

## 2012-09-05 ENCOUNTER — Other Ambulatory Visit: Payer: Self-pay

## 2012-09-05 ENCOUNTER — Emergency Department (HOSPITAL_COMMUNITY): Payer: Medicare Other

## 2012-09-05 ENCOUNTER — Inpatient Hospital Stay (HOSPITAL_COMMUNITY)
Admission: EM | Admit: 2012-09-05 | Discharge: 2012-09-07 | DRG: 690 | Disposition: A | Discharge status: Home / Self Care | Payer: Medicare Other | Attending: Family Medicine | Admitting: Family Medicine

## 2012-09-05 ENCOUNTER — Observation Stay (HOSPITAL_COMMUNITY): Payer: Medicare Other

## 2012-09-05 DIAGNOSIS — J449 Chronic obstructive pulmonary disease, unspecified: Secondary | ICD-10-CM | POA: Diagnosis present

## 2012-09-05 DIAGNOSIS — I4891 Unspecified atrial fibrillation: Secondary | ICD-10-CM

## 2012-09-05 DIAGNOSIS — M940 Chondrocostal junction syndrome [Tietze]: Secondary | ICD-10-CM

## 2012-09-05 DIAGNOSIS — R6889 Other general symptoms and signs: Secondary | ICD-10-CM

## 2012-09-05 DIAGNOSIS — K824 Cholesterolosis of gallbladder: Secondary | ICD-10-CM

## 2012-09-05 DIAGNOSIS — E785 Hyperlipidemia, unspecified: Secondary | ICD-10-CM

## 2012-09-05 DIAGNOSIS — E559 Vitamin D deficiency, unspecified: Secondary | ICD-10-CM | POA: Diagnosis present

## 2012-09-05 DIAGNOSIS — S43109A Unspecified dislocation of unspecified acromioclavicular joint, initial encounter: Secondary | ICD-10-CM

## 2012-09-05 DIAGNOSIS — Z7901 Long term (current) use of anticoagulants: Secondary | ICD-10-CM

## 2012-09-05 DIAGNOSIS — L03115 Cellulitis of right lower limb: Secondary | ICD-10-CM

## 2012-09-05 DIAGNOSIS — J45901 Unspecified asthma with (acute) exacerbation: Secondary | ICD-10-CM

## 2012-09-05 DIAGNOSIS — M129 Arthropathy, unspecified: Secondary | ICD-10-CM | POA: Diagnosis present

## 2012-09-05 DIAGNOSIS — K219 Gastro-esophageal reflux disease without esophagitis: Secondary | ICD-10-CM | POA: Diagnosis present

## 2012-09-05 DIAGNOSIS — Z950 Presence of cardiac pacemaker: Secondary | ICD-10-CM

## 2012-09-05 DIAGNOSIS — L309 Dermatitis, unspecified: Secondary | ICD-10-CM

## 2012-09-05 DIAGNOSIS — F0390 Unspecified dementia without behavioral disturbance: Secondary | ICD-10-CM

## 2012-09-05 DIAGNOSIS — J4489 Other specified chronic obstructive pulmonary disease: Secondary | ICD-10-CM | POA: Diagnosis present

## 2012-09-05 DIAGNOSIS — A498 Other bacterial infections of unspecified site: Secondary | ICD-10-CM | POA: Diagnosis present

## 2012-09-05 DIAGNOSIS — R350 Frequency of micturition: Secondary | ICD-10-CM

## 2012-09-05 DIAGNOSIS — I959 Hypotension, unspecified: Secondary | ICD-10-CM | POA: Diagnosis present

## 2012-09-05 DIAGNOSIS — G8929 Other chronic pain: Secondary | ICD-10-CM | POA: Diagnosis present

## 2012-09-05 DIAGNOSIS — I441 Atrioventricular block, second degree: Secondary | ICD-10-CM

## 2012-09-05 DIAGNOSIS — Z87891 Personal history of nicotine dependence: Secondary | ICD-10-CM

## 2012-09-05 DIAGNOSIS — R0989 Other specified symptoms and signs involving the circulatory and respiratory systems: Secondary | ICD-10-CM

## 2012-09-05 DIAGNOSIS — T50995A Adverse effect of other drugs, medicaments and biological substances, initial encounter: Secondary | ICD-10-CM

## 2012-09-05 DIAGNOSIS — M1610 Unilateral primary osteoarthritis, unspecified hip: Secondary | ICD-10-CM

## 2012-09-05 DIAGNOSIS — Z0000 Encounter for general adult medical examination without abnormal findings: Secondary | ICD-10-CM

## 2012-09-05 DIAGNOSIS — M545 Low back pain: Secondary | ICD-10-CM

## 2012-09-05 DIAGNOSIS — Z87898 Personal history of other specified conditions: Secondary | ICD-10-CM

## 2012-09-05 DIAGNOSIS — I2510 Atherosclerotic heart disease of native coronary artery without angina pectoris: Secondary | ICD-10-CM | POA: Diagnosis present

## 2012-09-05 DIAGNOSIS — R4182 Altered mental status, unspecified: Secondary | ICD-10-CM

## 2012-09-05 DIAGNOSIS — D696 Thrombocytopenia, unspecified: Secondary | ICD-10-CM

## 2012-09-05 DIAGNOSIS — I25810 Atherosclerosis of coronary artery bypass graft(s) without angina pectoris: Secondary | ICD-10-CM

## 2012-09-05 DIAGNOSIS — K8020 Calculus of gallbladder without cholecystitis without obstruction: Secondary | ICD-10-CM

## 2012-09-05 DIAGNOSIS — F528 Other sexual dysfunction not due to a substance or known physiological condition: Secondary | ICD-10-CM

## 2012-09-05 DIAGNOSIS — T148XXA Other injury of unspecified body region, initial encounter: Secondary | ICD-10-CM

## 2012-09-05 DIAGNOSIS — E039 Hypothyroidism, unspecified: Secondary | ICD-10-CM | POA: Diagnosis present

## 2012-09-05 DIAGNOSIS — Z951 Presence of aortocoronary bypass graft: Secondary | ICD-10-CM

## 2012-09-05 DIAGNOSIS — G4700 Insomnia, unspecified: Secondary | ICD-10-CM

## 2012-09-05 DIAGNOSIS — N390 Urinary tract infection, site not specified: Principal | ICD-10-CM | POA: Diagnosis present

## 2012-09-05 DIAGNOSIS — I10 Essential (primary) hypertension: Secondary | ICD-10-CM | POA: Diagnosis present

## 2012-09-05 DIAGNOSIS — I714 Abdominal aortic aneurysm, without rupture: Secondary | ICD-10-CM

## 2012-09-05 DIAGNOSIS — R0602 Shortness of breath: Secondary | ICD-10-CM | POA: Diagnosis present

## 2012-09-05 DIAGNOSIS — M549 Dorsalgia, unspecified: Secondary | ICD-10-CM | POA: Diagnosis present

## 2012-09-05 DIAGNOSIS — K5900 Constipation, unspecified: Secondary | ICD-10-CM

## 2012-09-05 DIAGNOSIS — R609 Edema, unspecified: Secondary | ICD-10-CM

## 2012-09-05 DIAGNOSIS — J438 Other emphysema: Secondary | ICD-10-CM | POA: Diagnosis present

## 2012-09-05 DIAGNOSIS — M79606 Pain in leg, unspecified: Secondary | ICD-10-CM

## 2012-09-05 DIAGNOSIS — G2581 Restless legs syndrome: Secondary | ICD-10-CM | POA: Diagnosis present

## 2012-09-05 DIAGNOSIS — Z79899 Other long term (current) drug therapy: Secondary | ICD-10-CM

## 2012-09-05 LAB — URINE MICROSCOPIC-ADD ON

## 2012-09-05 LAB — URINALYSIS, ROUTINE W REFLEX MICROSCOPIC
Bilirubin Urine: NEGATIVE
Glucose, UA: NEGATIVE mg/dL
Ketones, ur: 15 mg/dL — AB
Protein, ur: 100 mg/dL — AB

## 2012-09-05 LAB — BASIC METABOLIC PANEL
BUN: 23 mg/dL (ref 6–23)
CO2: 26 mEq/L (ref 19–32)
Calcium: 9.1 mg/dL (ref 8.4–10.5)
Creatinine, Ser: 1.35 mg/dL (ref 0.50–1.35)
Glucose, Bld: 92 mg/dL (ref 70–99)

## 2012-09-05 LAB — CBC
MCH: 30.1 pg (ref 26.0–34.0)
MCV: 92.4 fL (ref 78.0–100.0)
Platelets: 62 10*3/uL — ABNORMAL LOW (ref 150–400)
RDW: 14.2 % (ref 11.5–15.5)

## 2012-09-05 LAB — COMPREHENSIVE METABOLIC PANEL
Alkaline Phosphatase: 79 U/L (ref 39–117)
BUN: 21 mg/dL (ref 6–23)
Chloride: 100 mEq/L (ref 96–112)
Creatinine, Ser: 1.32 mg/dL (ref 0.50–1.35)
GFR calc Af Amer: 55 mL/min — ABNORMAL LOW (ref >90)
GFR calc non Af Amer: 48 mL/min — ABNORMAL LOW (ref >90)
Glucose, Bld: 104 mg/dL — ABNORMAL HIGH (ref 70–99)
Potassium: 3.8 mEq/L (ref 3.5–5.1)
Total Bilirubin: 1.8 mg/dL — ABNORMAL HIGH (ref 0.3–1.2)

## 2012-09-05 LAB — MAGNESIUM: Magnesium: 1.8 mg/dL (ref 1.5–2.5)

## 2012-09-05 LAB — PHOSPHORUS: Phosphorus: 1.8 mg/dL — ABNORMAL LOW (ref 2.3–4.6)

## 2012-09-05 LAB — TROPONIN I: Troponin I: <0.3 ng/mL (ref <0.30)

## 2012-09-05 MED ORDER — ONDANSETRON HCL 4 MG PO TABS: 4.0000 mg | ORAL_TABLET | Freq: Four times a day (QID) | ORAL | Status: DC | PRN

## 2012-09-05 MED ORDER — WARFARIN - PHARMACIST DOSING INPATIENT: Freq: Every day | Status: DC

## 2012-09-05 MED ORDER — ASPIRIN EC 81 MG PO TBEC
81.0000 mg | DELAYED_RELEASE_TABLET | Freq: Every morning | ORAL | Status: DC
  Administered 2012-09-05 – 2012-09-07 (×3): 81 mg via ORAL
  Filled 2012-09-05 (×3): qty 1

## 2012-09-05 MED ORDER — ALBUTEROL SULFATE HFA 108 (90 BASE) MCG/ACT IN AERS
2.0000 | INHALATION_SPRAY | Freq: Four times a day (QID) | RESPIRATORY_TRACT | Status: DC | PRN
  Administered 2012-09-05 – 2012-09-07 (×5): 2 via RESPIRATORY_TRACT
  Filled 2012-09-05: qty 6.7

## 2012-09-05 MED ORDER — ROPINIROLE HCL 1 MG PO TABS
4.0000 mg | ORAL_TABLET | Freq: Every day | ORAL | Status: DC
  Administered 2012-09-05 – 2012-09-06 (×2): 4 mg via ORAL
  Filled 2012-09-05 (×3): qty 4

## 2012-09-05 MED ORDER — ENOXAPARIN SODIUM 40 MG/0.4ML ~~LOC~~ SOLN: 40.0000 mg | SUBCUTANEOUS | Status: DC

## 2012-09-05 MED ORDER — SODIUM CHLORIDE 0.9 % IV BOLUS (SEPSIS)
500.0000 mL | Freq: Once | INTRAVENOUS | Status: AC
  Administered 2012-09-05: 500 mL via INTRAVENOUS

## 2012-09-05 MED ORDER — SODIUM CHLORIDE 0.9 % IV SOLN
Freq: Once | INTRAVENOUS | Status: AC
  Administered 2012-09-05: 03:00:00 via INTRAVENOUS

## 2012-09-05 MED ORDER — HALOPERIDOL LACTATE 5 MG/ML IJ SOLN: 1.0000 mg | Freq: Four times a day (QID) | INTRAMUSCULAR | Status: DC | PRN

## 2012-09-05 MED ORDER — ALBUTEROL SULFATE HFA 108 (90 BASE) MCG/ACT IN AERS: 2.0000 | INHALATION_SPRAY | Freq: Four times a day (QID) | RESPIRATORY_TRACT | Status: DC | PRN

## 2012-09-05 MED ORDER — ONDANSETRON HCL 4 MG/2ML IJ SOLN: 4.0000 mg | Freq: Four times a day (QID) | INTRAMUSCULAR | Status: DC | PRN

## 2012-09-05 MED ORDER — CLONAZEPAM 0.5 MG PO TABS
0.5000 mg | ORAL_TABLET | Freq: Every evening | ORAL | Status: DC | PRN
  Administered 2012-09-06: 0.5 mg via ORAL
  Filled 2012-09-05: qty 1

## 2012-09-05 MED ORDER — PANTOPRAZOLE SODIUM 40 MG PO TBEC: 40.0000 mg | DELAYED_RELEASE_TABLET | Freq: Every morning | ORAL | Status: DC

## 2012-09-05 MED ORDER — ACETAMINOPHEN 650 MG RE SUPP: 650.0000 mg | Freq: Four times a day (QID) | RECTAL | Status: DC | PRN

## 2012-09-05 MED ORDER — SODIUM CHLORIDE 0.9 % IV SOLN: INTRAVENOUS | Status: AC

## 2012-09-05 MED ORDER — PANTOPRAZOLE SODIUM 40 MG PO TBEC
40.0000 mg | DELAYED_RELEASE_TABLET | Freq: Every day | ORAL | Status: DC
  Administered 2012-09-05 – 2012-09-07 (×3): 40 mg via ORAL
  Filled 2012-09-05 (×3): qty 1

## 2012-09-05 MED ORDER — ACETAMINOPHEN 325 MG PO TABS
650.0000 mg | ORAL_TABLET | Freq: Four times a day (QID) | ORAL | Status: DC | PRN
  Administered 2012-09-07: 650 mg via ORAL
  Filled 2012-09-05: qty 2

## 2012-09-05 MED ORDER — CEFTRIAXONE SODIUM 1 G IJ SOLR
1.0000 g | Freq: Once | INTRAMUSCULAR | Status: AC
  Administered 2012-09-05: 1 g via INTRAVENOUS
  Filled 2012-09-05: qty 10

## 2012-09-05 MED ORDER — BIOTENE DRY MOUTH MT LIQD: 15.0000 mL | Freq: Two times a day (BID) | OROMUCOSAL | Status: DC

## 2012-09-05 MED ORDER — DONEPEZIL HCL 5 MG PO TABS
5.0000 mg | ORAL_TABLET | Freq: Every day | ORAL | Status: DC
  Administered 2012-09-05 – 2012-09-06 (×2): 5 mg via ORAL
  Filled 2012-09-05 (×3): qty 1

## 2012-09-05 MED ORDER — DEXTROSE 5 % IV SOLN
1.0000 g | Freq: Every day | INTRAVENOUS | Status: DC
  Administered 2012-09-05 – 2012-09-06 (×2): 1 g via INTRAVENOUS
  Filled 2012-09-05 (×3): qty 10

## 2012-09-05 MED ORDER — SIMVASTATIN 10 MG PO TABS
10.0000 mg | ORAL_TABLET | Freq: Every day | ORAL | Status: DC
  Administered 2012-09-05 – 2012-09-06 (×2): 10 mg via ORAL
  Filled 2012-09-05 (×3): qty 1

## 2012-09-05 MED ORDER — WARFARIN SODIUM 7.5 MG PO TABS
7.5000 mg | ORAL_TABLET | Freq: Once | ORAL | Status: AC
  Administered 2012-09-05: 7.5 mg via ORAL
  Filled 2012-09-05: qty 1

## 2012-09-05 MED ORDER — SODIUM CHLORIDE 0.9 % IV SOLN
INTRAVENOUS | Status: DC
  Administered 2012-09-05: 1000 mL via INTRAVENOUS
  Administered 2012-09-05: 19:00:00 via INTRAVENOUS
  Administered 2012-09-06: 1000 mL via INTRAVENOUS
  Administered 2012-09-06: 11:00:00 via INTRAVENOUS

## 2012-09-05 NOTE — ED Provider Notes (Signed)
History     CSN: [id number]  Arrival date & time 09/05/12  0054   First MD Initiated Contact with Patient 09/05/12 0134      Chief Complaint  Patient presents with  . Weakness    (Consider location/radiation/quality/duration/timing/severity/associated sxs/prior treatment) HPI History provided by patient's wife.  Pt was struggling to get out of bed yesterday evening, laid back down, and when she attempted to help him up, he was unresponsive.  Eyes were open, would not speak and was dead weight.  She ran next door to get her neighbor for help, and when they returned, he was able to give single word answers to their questions but still unable to move.  His neighbor reports that he is closer to baseline now, but seems to be confused.  She can not explain any further.  Pt denies recent head trauma.  Has had increased urinary frequency recently but denies fever, cough, CP, SOB, abdominal pain, N/V/D and other urinary sx.  He has no complaints currently.  Past Medical History  Diagnosis Date  . CAD (coronary artery disease)   . Hypertension   . Vitamin D deficiency   . Chronic back pain   . Thyroid disease   . Anemia   . ED (erectile dysfunction)   . Arthritis   . RLS (restless legs syndrome)   . GERD (gastroesophageal reflux disease)   . COPD (chronic obstructive pulmonary disease)   . Hyperlipidemia   . Atrial fibrillation   . Second degree Mobitz II AV block     s/p PPM  . Asthma 09/15/2011  . Long term current use of anticoagulant 09/15/2011  . Thrombocytopenia 09/15/2011    Past Surgical History  Procedure Date  . Coronary artery bypass graft   . Cataract extraction   . Pacemaker insertion     Family History  Problem Relation Age of Onset  . Diabetes Sister     History  Substance Use Topics  . Smoking status: Former Games developer  . Smokeless tobacco: Never Used  . Alcohol Use: Yes      Review of Systems  All other systems reviewed and are negative.    Allergies    Amoxicillin and Penicillins  Home Medications   Current Outpatient Rx  Name  Route  Sig  Dispense  Refill  . ALBUTEROL SULFATE HFA 108 (90 BASE) MCG/ACT IN AERS   Inhalation   Inhale 2 puffs into the lungs every 6 (six) hours as needed. For wheezing or shortness of breath         . ASPIRIN 81 MG PO TABS   Oral   Take 81 mg by mouth every morning.          Marland Kitchen CLONAZEPAM 0.5 MG PO TABS   Oral   Take 0.5-1 mg by mouth at bedtime as needed. For sleep or anxiety         . DONEPEZIL HCL 5 MG PO TABS   Oral   Take 5 mg by mouth at bedtime.         . ISOSORBIDE MONONITRATE ER 30 MG PO TB24   Oral   Take 15 mg by mouth every morning.         Marland Kitchen LORATADINE 10 MG PO TABS   Oral   Take 10 mg by mouth every morning.          Marland Kitchen METOPROLOL SUCCINATE ER 25 MG PO TB24   Oral   Take 25 mg by mouth at bedtime.         Marland Kitchen  ONE-DAILY MULTI VITAMINS PO TABS   Oral   Take 1 tablet by mouth every morning.          Marland Kitchen PANTOPRAZOLE SODIUM 40 MG PO TBEC   Oral   Take 40 mg by mouth every morning.         Marland Kitchen ROPINIROLE HCL 4 MG PO TABS   Oral   Take 4 mg by mouth at bedtime.         Marland Kitchen SIMVASTATIN 10 MG PO TABS   Oral   Take 10 mg by mouth at bedtime.         . WARFARIN SODIUM 5 MG PO TABS   Oral   Take 5-7.5 mg by mouth at bedtime. Take 7.5mg  Monday, Wednesday, and Friday. Take 5mg  each other day of the week.         Marland Kitchen EPINEPHRINE 0.3 MG/0.3ML IJ DEVI   Intramuscular   Inject 0.3 mg into the muscle once as needed. For severe allergic reaction           BP 97/54  Pulse 76  Temp 99.9 F (37.7 C) (Oral)  Resp 18  SpO2 100%  Physical Exam  Nursing note and vitals reviewed. Constitutional: He is oriented to person, place, and time. He appears well-developed and well-nourished. No distress.  HENT:  Head: Normocephalic and atraumatic.  Mouth/Throat: Oropharynx is clear and moist.  Eyes:       Normal appearance  Neck: Normal range of motion.   Cardiovascular: Normal rate and regular rhythm.   Pulmonary/Chest: Effort normal and breath sounds normal. No respiratory distress.  Abdominal: Soft. Bowel sounds are normal. He exhibits no distension and no mass. There is no tenderness. There is no rebound and no guarding.  Genitourinary:       No CVA tenderness  Musculoskeletal: Normal range of motion. He exhibits no edema and no tenderness.  Neurological: He is alert and oriented to person, place, and time.  Skin: Skin is warm and dry. No rash noted.  Psychiatric: He has a normal mood and affect. His behavior is normal.    ED Course  Procedures (including critical care time)  Date: 09/05/2012  Rate: 83  Rhythm: paced   QRS Axis: indeterminate  Intervals: normal  ST/T Wave abnormalities: normal  Conduction Disutrbances:right bundle branch block and left anterior fascicular block  Narrative Interpretation:   Old EKG Reviewed: changed   Labs Reviewed  URINALYSIS, ROUTINE W REFLEX MICROSCOPIC - Abnormal; Notable for the following:    Color, Urine AMBER (*)  BIOCHEMICALS MAY BE AFFECTED BY COLOR   APPearance CLOUDY (*)     Hgb urine dipstick LARGE (*)     Ketones, ur 15 (*)     Protein, ur 100 (*)     Nitrite POSITIVE (*)     Leukocytes, UA LARGE (*)     All other components within normal limits  BASIC METABOLIC PANEL - Abnormal; Notable for the following:    GFR calc non Af Amer 47 (*)     GFR calc Af Amer 54 (*)     All other components within normal limits  CBC - Abnormal; Notable for the following:    WBC 14.0 (*)     Platelets 62 (*)     All other components within normal limits  PROTIME-INR - Abnormal; Notable for the following:    Prothrombin Time 21.9 (*)     INR 2.00 (*)     All other components within normal limits  URINE MICROSCOPIC-ADD ON - Abnormal; Notable for the following:    Squamous Epithelial / LPF FEW (*)     Bacteria, UA MANY (*)     All other components within normal limits  GLUCOSE, CAPILLARY   TROPONIN I  URINE CULTURE   Ct Head Wo Contrast  09/05/2012  *RADIOLOGY REPORT*  Clinical Data: Weakness, altered mental status  CT HEAD WITHOUT CONTRAST  Technique:  Contiguous axial images were obtained from the base of the skull through the vertex without contrast.  Comparison: Head CT 01/09/2009  Findings: No acute intracranial hemorrhage.  No focal mass lesion. No acute infarction.  There is a diffuse cortical atrophy and ventricular dilatation which is not changed from prior.  Extensive periventricular subcortical white matter hypodensities also unchanged.  Paranasal sinuses and mastoid air cells are clear.  Orbits are normal.  IMPRESSION:  1.  No acute intracranial findings. 2.  Extensive atrophy and microvascular disease not changed from prior.   Original Report Authenticated By: Genevive Bi, M.D.    Dg Chest Portable 1 View  09/05/2012  *RADIOLOGY REPORT*  Clinical Data: Weakness  PORTABLE CHEST - 1 VIEW  Comparison: Chest radiograph 05/17/2011  Findings: Left-sided pacemaker with two continuous leads overlies stable cardiac silhouette.  No effusion, infiltrate, or pneumothorax.  Lungs are hyperinflated.  There is mild bronchitic change.  IMPRESSION:  1.  No acute cardiopulmonary findings. 2.  Emphysematous change.   Original Report Authenticated By: Genevive Bi, M.D.      1. Urinary tract infection       MDM  [phone] M presents w/ AMS.  No acute findings on exam.  CT head and CXR neg.  U/A positive for infection and labs otherwise unremarkable.  All results discussed w/ patient and his wife.  IV rocephin ordered and [facility] consulted for admission.          Otilio Miu, PA-C 09/05/12 0630

## 2012-09-05 NOTE — Progress Notes (Addendum)
Admitted to room 1519 with SOB and UTI. Oriented to room and staff expressed understanding. On continuous pulse ox. IVF bolus done. BP improved. Patient alert and oriented except for year. Neuro exam unremarkable. Patient with pacemaker. Placed on telemetry.

## 2012-09-05 NOTE — H&P (Signed)
[facility] AD[facility]ION NOTE  History and Physical  [name] [name] WUJ:[id number] DOB: 08/26/1926 DOA: 09/05/2012   PCP: Oliver Barre, MD   Chief Complaint: Shortness of breath, weakness and altered mental status HPI:  Patient is an 79 year old man with past medical history most significant for coronary artery disease, hypertension, thyroid disease, COPD, hyperlipidemia, atrial fibrillation, type II AV block status post pacemaker and on chronic anticoagulation who comes in with chief complaints of shortness of breath. Patient was in [facility] 3 days ago where his shortness of breath started. Patient flew to [facility] and it was about a 2 hour flight. His shortness of breath stayed about the same as last 3 days. This morning patient was moving some heavy boxes. His shortness of breath became worse and he lay down to get some rest. He was not able to get up and was staring blankly at that time when family got scared and called EMS. As per EMS report the patient was alert oriented x4 and was able to move all 4 extremities. Patient has problems with urination and tells me that that is because he does not drink much water. He has no other complaints at this time.  Review of Systems:  Specifically the patient denies cough, chest pain, palpitations, fever, headache, abdominal pain, change in urinary habits, change in bowel habits, lower extremity edema, difficulty in speech, weakness in any one particular part of his body or any sleeping problems. General: Positive for weakness Genitourinary: Negtive for dysuria, urgency or frequency. Positive for difficulty urinating CVS: Denies any chest, palpitations Abdomen: Denies abdominal pain, nausea or vomiting   Past Medical History  Diagnosis Date  . CAD (coronary artery disease)   . Hypertension   . Vitamin D deficiency   . Chronic back pain   . Thyroid disease   . Anemia   . ED (erectile dysfunction)   . Arthritis   . RLS (restless legs  syndrome)   . GERD (gastroesophageal reflux disease)   . COPD (chronic obstructive pulmonary disease)   . Hyperlipidemia   . Atrial fibrillation   . Second degree Mobitz II AV block     s/p PPM  . Asthma 09/15/2011  . Long term current use of anticoagulant 09/15/2011  . Thrombocytopenia 09/15/2011    Past Surgical History  Procedure Date  . Coronary artery bypass graft   . Cataract extraction   . Pacemaker insertion     Social History:  reports that he has quit smoking. He has never used smokeless tobacco. He reports that he drinks alcohol. He reports that he does not use illicit drugs.  Allergies  Allergen Reactions  . Amoxicillin Itching and Rash  . Penicillins Itching and Rash    Family History  Problem Relation Age of Onset  . Diabetes Sister      Prior to Admission medications   Medication Sig Start Date End Date Taking? Authorizing Provider  albuterol (PROVENTIL HFA;VENTOLIN HFA) 108 (90 BASE) MCG/ACT inhaler Inhale 2 puffs into the lungs every 6 (six) hours as needed. For wheezing or shortness of breath 03/14/12  Yes Corwin Levins, MD  aspirin 81 MG tablet Take 81 mg by mouth every morning.    Yes Historical Provider, MD  clonazePAM (KLONOPIN) 0.5 MG tablet Take 0.5-1 mg by mouth at bedtime as needed. For sleep or anxiety   Yes Historical Provider, MD  donepezil (ARICEPT) 5 MG tablet Take 5 mg by mouth at bedtime. 08/14/12  Yes Corwin Levins, MD  isosorbide mononitrate (IMDUR) 30 MG 24 hr tablet Take 15 mg by mouth every morning.   Yes Historical Provider, MD  loratadine (CLARITIN) 10 MG tablet Take 10 mg by mouth every morning.    Yes Historical Provider, MD  metoprolol succinate (TOPROL-XL) 25 MG 24 hr tablet Take 25 mg by mouth at bedtime.   Yes Historical Provider, MD  Multiple Vitamin (MULTIVITAMIN) tablet Take 1 tablet by mouth every morning.    Yes Historical Provider, MD  pantoprazole (PROTONIX) 40 MG tablet Take 40 mg by mouth every morning. 10/03/11 10/02/12 Yes  Corwin Levins, MD  rOPINIRole (REQUIP) 4 MG tablet Take 4 mg by mouth at bedtime.   Yes Historical Provider, MD  simvastatin (ZOCOR) 10 MG tablet Take 10 mg by mouth at bedtime.   Yes Historical Provider, MD  warfarin (COUMADIN) 5 MG tablet Take 5-7.5 mg by mouth at bedtime. Take 7.5mg  Monday, Wednesday, and Friday. Take 5mg  each other day of the week.   Yes Historical Provider, MD  EPINEPHrine (EPI-PEN) 0.3 mg/0.3 mL DEVI Inject 0.3 mg into the muscle once as needed. For severe allergic reaction    Historical Provider, MD   Physical Exam: Filed Vitals:   09/05/12 0112 09/05/12 0400  BP: 97/54 88/64  Pulse: 76 119  Temp: 99.9 F (37.7 C)   TempSrc: Oral   Resp: 18 24  SpO2: 100% 96%     General:  Alert and oriented x4, he was able to tell me that his [facility], current president obama and date 09/05/2012, appears in no acute distress but lying in bed in prone position and tells me that prone position is more comfortable for him than lying on his back  Eyes: PERRLA, EOMI  Neck: supple, no lymphadenopathy  Cardiovascular: Irregularly irregular rhythm, S1, S2 normal, no murmur, rubs or gallop  Respiratory: Decreased breath sounds on the left lower base and left mid lung, no crackles or wheezing heard  Abdomen: soft, non tender, positive bowel sounds, no organomegaly  Neurologic: Grossly nonfocal. Able to move all 4 extremities. Alert and oriented as above  Wt Readings from Last 3 Encounters:  08/14/12 176 lb 1.3 oz (79.869 kg)  07/26/12 176 lb (79.833 kg)  07/11/12 178 lb 9.6 oz (81.012 kg)    Labs on Admission:  Basic Metabolic Panel:  Lab 09/05/12 1610  NA 135  K 4.6  CL 100  CO2 26  GLUCOSE 92  BUN 23  CREATININE 1.35  CALCIUM 9.1  MG --  PHOS --     CBC:  Lab 09/05/12 0143  WBC 14.0*  NEUTROABS --  HGB 13.5  HCT 41.4  MCV 92.4  PLT 62*    Cardiac Enzymes:  Lab 09/05/12 0143  CKTOTAL --  CKMB --  CKMBINDEX --  TROPONINI <0.30      CBG:  Lab 09/05/12 0118  GLUCAP 92     Radiological Exams on Admission: Ct Head Wo Contrast  09/05/2012  *RADIOLOGY REPORT*  Clinical Data: Weakness, altered mental status  CT HEAD WITHOUT CONTRAST  Technique:  Contiguous axial images were obtained from the base of the skull through the vertex without contrast.  Comparison: Head CT 01/09/2009  Findings: No acute intracranial hemorrhage.  No focal mass lesion. No acute infarction.  There is a diffuse cortical atrophy and ventricular dilatation which is not changed from prior.  Extensive periventricular subcortical white matter hypodensities also unchanged.  Paranasal sinuses and mastoid air cells are clear.  Orbits are normal.  IMPRESSION:  1.  No acute intracranial findings. 2.  Extensive atrophy and microvascular disease not changed from prior.   Original Report Authenticated By: Genevive Bi, M.D.    Dg Chest Portable 1 View  09/05/2012  *RADIOLOGY REPORT*  Clinical Data: Weakness  PORTABLE CHEST - 1 VIEW  Comparison: Chest radiograph 05/17/2011  Findings: Left-sided pacemaker with two continuous leads overlies stable cardiac silhouette.  No effusion, infiltrate, or pneumothorax.  Lungs are hyperinflated.  There is mild bronchitic change.  IMPRESSION:  1.  No acute cardiopulmonary findings. 2.  Emphysematous change.   Original Report Authenticated By: Genevive Bi, M.D.        Principal Problem:  *UTI (lower urinary tract infection) Active Problems:  Shortness of breath  HYPOTHYROIDISM  HYPERLIPIDEMIA  HYPERTENSION, BENIGN  CAD  COPD  GERD  BACK PAIN, CHRONIC  Altered mental status   Assessment/Plan  UTI: Patient presents with generalized weakness. He was found to have UTI  In ER( UA with large leucocytes with too numerous too count WBC's on microscopy). Patient's altered mental status could be related to his UTI. He seems to be completely alert at the time of my examination. Admit to telemetry bed F/U urine  cultures Empirically treat with Rocephin IV fluids  Shortness of breath: Patient has had history of CABG and coronary artery disease in the past. Although patient does not complain of any cough, fevers my physical exam was suggestive of decreased air entry on the left lung. Differentials for shortness of breath include coronary artery disease, pneumonia and PE(given patient's recent air travel) I will obtain 2 view chest x-ray Cycle cardiac enzymes x3 Repeat EKG in a.m. Consider CT angio if above measures does not reveal the cause of shortness of breath. Also patient is currently on full dose anticoagulation so PE be extremely less likely given his current situation.  Hypotension:  likely 2/2 poor intake.  Hydrate with IV fluids Bolus 500 cc now  Atrial fibrillation: rate controlled. Continue coumadin. Hold metoprolol for soft BP.  Consider restarting metoprolol in a.m.  CAD s/p CABG: Continue ASA, zocor. Hold imdur and metoprolol given soft BP. Consider restarting metoprolol and Imdur in a.m.  Restless leg syndrome: Continue ropinirole.    DVT: coumadin  Code Status: Full Family Communication: Patient was updated on the plan of care Disposition Plan/Anticipated LOS:  Likely in 1-2 days  Time spent: 60 minutes  Lars Mage, MD  [facility] Team 5  If 7PM-7AM, please contact night-coverage at [email or web address], password [facility] 09/05/2012, 4:28 AM

## 2012-09-05 NOTE — ED Provider Notes (Addendum)
Medical screening examination/treatment/procedure(s) were conducted as a shared visit with non-physician practitioner(s) and myself.  I personally evaluated the patient during the encounter. A/O x 4 without focal deficits or fever, has remote h/o TIA, pacer and UTI. Given ABx and plan admit MED.   Sunnie Nielsen, MD 09/05/12 2256  Sunnie Nielsen, MD 09/05/12 2257

## 2012-09-05 NOTE — Progress Notes (Signed)
ANTICOAGULATION CONSULT NOTE - Initial Consult  Pharmacy Consult for warfarin Indication: atrial fibrillation  Allergies  Allergen Reactions  . Amoxicillin Itching and Rash  . Penicillins Itching and Rash    Patient Measurements:   Heparin Dosing Weight:   Vital Signs: Temp: 99.9 F (37.7 C) (01/01 0112) Temp src: Oral (01/01 0112) BP: 88/64 mmHg (01/01 0400) Pulse Rate: 119  (01/01 0400)  Labs:  Basename 09/05/12 0143  HGB 13.5  HCT 41.4  PLT 62*  APTT --  LABPROT 21.9*  INR 2.00*  HEPARINUNFRC --  CREATININE 1.35  CKTOTAL --  CKMB --  TROPONINI <0.30    The CrCl is unknown because both a height and weight (above a minimum accepted value) are required for this calculation.   Medical History: Past Medical History  Diagnosis Date  . CAD (coronary artery disease)   . Hypertension   . Vitamin D deficiency   . Chronic back pain   . Thyroid disease   . Anemia   . ED (erectile dysfunction)   . Arthritis   . RLS (restless legs syndrome)   . GERD (gastroesophageal reflux disease)   . COPD (chronic obstructive pulmonary disease)   . Hyperlipidemia   . Atrial fibrillation   . Second degree Mobitz II AV block     s/p PPM  . Asthma 09/15/2011  . Long term current use of anticoagulant 09/15/2011  . Thrombocytopenia 09/15/2011    Medications:  Prescriptions prior to admission  Medication Sig Dispense Refill  . albuterol (PROVENTIL HFA;VENTOLIN HFA) 108 (90 BASE) MCG/ACT inhaler Inhale 2 puffs into the lungs every 6 (six) hours as needed. For wheezing or shortness of breath      . aspirin 81 MG tablet Take 81 mg by mouth every morning.       . clonazePAM (KLONOPIN) 0.5 MG tablet Take 0.5-1 mg by mouth at bedtime as needed. For sleep or anxiety      . donepezil (ARICEPT) 5 MG tablet Take 5 mg by mouth at bedtime.      . isosorbide mononitrate (IMDUR) 30 MG 24 hr tablet Take 15 mg by mouth every morning.      . loratadine (CLARITIN) 10 MG tablet Take 10 mg by mouth  every morning.       . metoprolol succinate (TOPROL-XL) 25 MG 24 hr tablet Take 25 mg by mouth at bedtime.      . Multiple Vitamin (MULTIVITAMIN) tablet Take 1 tablet by mouth every morning.       . pantoprazole (PROTONIX) 40 MG tablet Take 40 mg by mouth every morning.      Marland Kitchen rOPINIRole (REQUIP) 4 MG tablet Take 4 mg by mouth at bedtime.      . simvastatin (ZOCOR) 10 MG tablet Take 10 mg by mouth at bedtime.      Marland Kitchen warfarin (COUMADIN) 5 MG tablet Take 5-7.5 mg by mouth at bedtime. Take 7.5mg  Monday, Wednesday, and Friday. Take 5mg  each other day of the week.      Marland Kitchen EPINEPHrine (EPI-PEN) 0.3 mg/0.3 mL DEVI Inject 0.3 mg into the muscle once as needed. For severe allergic reaction        Assessment: Patient with Afib on chronic warfarin.  INR =2.    Goal of Therapy:  INR 2-3    Plan:  Warfarin 7.5mg  po x1 at 1800 Daily INR  Aleene Davidson Crowford 09/05/2012,6:02 AM

## 2012-09-05 NOTE — ED Notes (Signed)
Pt states that he got winded from too much exertion. Family states that the pt was moving some heavy items. He laid down to rest and when he tried to get up he was staring blankly and unable to move. They called EMS. Pt now A&O x 4 and able to move all extremities. Family also states pt has had trouble urinating and has not eaten or drank anything all day. Pt has hx of TIA and is on Coumadin for afib.

## 2012-09-05 NOTE — ED Notes (Signed)
YNW:[id number]<OZ> Expected date:09/05/12<BR> Expected time:12:14 AM<BR> Means of arrival:Ambulance<BR> Comments:<BR> 79 yo M  General weakness

## 2012-09-05 NOTE — ED Notes (Signed)
Pt arrived from home via EMS.  Pt's girlfriend called stating "he has a blank stare and is staggering." Pt. Has a history of TIA's.  Pt has a pacemaker in place.  Pt is A&O x4.  Pt's vitals according to EMS are WDL.

## 2012-09-05 NOTE — Progress Notes (Signed)
Subjective: Patient seen and examined, feels better, the mental status has improved and he is breathing better. Filed Vitals:   09/05/12 0941  BP:   Pulse: 80  Temp:   Resp:     Chest: Clear Bilaterally Heart : S1S2 RRR Abdomen: Soft, nontender Ext : No edema Neuro: Alert, oriented x 3  A/P  UTI Continue Rocephin  Dyspnea Improved. CXR is normal Cardiac enzymes are negative   Meredeth Ide [facility] Team [phone]

## 2012-09-05 NOTE — Progress Notes (Signed)
ANTICOAGULATION CONSULT NOTE - Initial Consult  Pharmacy Consult for warfarin Indication: atrial fibrillation  Allergies  Allergen Reactions  . Amoxicillin Itching and Rash  . Penicillins Itching and Rash    Patient Measurements: Height: 5\' 7"  (170.2 cm) Weight: 168 lb 8 oz (76.431 kg) IBW/kg (Calculated) : 66.1  Heparin Dosing Weight:   Vital Signs: Temp: 99.6 F (37.6 C) (01/01 0642) Temp src: Oral (01/01 0642) BP: 100/62 mmHg (01/01 0642) Pulse Rate: 80  (01/01 0941)  Labs:  Basename 09/05/12 0636 09/05/12 0143  HGB -- 13.5  HCT -- 41.4  PLT -- 62*  APTT -- --  LABPROT -- 21.9*  INR -- 2.00*  HEPARINUNFRC -- --  CREATININE 1.32 1.35  CKTOTAL -- --  CKMB -- --  TROPONINI <0.30 <0.30    Estimated Creatinine Clearance: 38.9 ml/min (by C-G formula based on Cr of 1.32).   Medical History: Past Medical History  Diagnosis Date  . CAD (coronary artery disease)   . Hypertension   . Vitamin D deficiency   . Chronic back pain   . Thyroid disease   . Anemia   . ED (erectile dysfunction)   . Arthritis   . RLS (restless legs syndrome)   . GERD (gastroesophageal reflux disease)   . COPD (chronic obstructive pulmonary disease)   . Hyperlipidemia   . Atrial fibrillation   . Second degree Mobitz II AV block     s/p PPM  . Asthma 09/15/2011  . Long term current use of anticoagulant 09/15/2011  . Thrombocytopenia 09/15/2011    Medications:  Prescriptions prior to admission  Medication Sig Dispense Refill  . albuterol (PROVENTIL HFA;VENTOLIN HFA) 108 (90 BASE) MCG/ACT inhaler Inhale 2 puffs into the lungs every 6 (six) hours as needed. For wheezing or shortness of breath      . aspirin 81 MG tablet Take 81 mg by mouth every morning.       . clonazePAM (KLONOPIN) 0.5 MG tablet Take 0.5-1 mg by mouth at bedtime as needed. For sleep or anxiety      . donepezil (ARICEPT) 5 MG tablet Take 5 mg by mouth at bedtime.      . isosorbide mononitrate (IMDUR) 30 MG 24 hr tablet  Take 15 mg by mouth every morning.      . loratadine (CLARITIN) 10 MG tablet Take 10 mg by mouth every morning.       . metoprolol succinate (TOPROL-XL) 25 MG 24 hr tablet Take 25 mg by mouth at bedtime.      . Multiple Vitamin (MULTIVITAMIN) tablet Take 1 tablet by mouth every morning.       . pantoprazole (PROTONIX) 40 MG tablet Take 40 mg by mouth every morning.      Marland Kitchen rOPINIRole (REQUIP) 4 MG tablet Take 4 mg by mouth at bedtime.      . simvastatin (ZOCOR) 10 MG tablet Take 10 mg by mouth at bedtime.      Marland Kitchen warfarin (COUMADIN) 5 MG tablet Take 5-7.5 mg by mouth at bedtime. Take 7.5mg  Monday, Wednesday, and Friday. Take 5mg  each other day of the week.      Marland Kitchen EPINEPHrine (EPI-PEN) 0.3 mg/0.3 mL DEVI Inject 0.3 mg into the muscle once as needed. For severe allergic reaction        Assessment:  79 yo M with Hx of A.Fib on chronic anticoagulation with warfarin.  Admitted with SOB and UTI  Warfarin Dose PTA = 7.5 mg on MWF and 5 mg on TTSS;  Last dose PTA was 12/31  INR on admission is therapeutic at 2  H/H wnl  Platelets low at 62 (hx of thrombocytopenia diagnosed in 09/2011, will monitor)  Goal of Therapy:  INR 2-3    Plan:   Warfarin 7.5 mg tonight at 1800  Daily PT/INR  Monitor thrombocytopenia   F/U Plans for CT/angio to r/o PE  [name] [name], Loma Messing PharmD Pager #: [phone] 12:35 PM 09/05/2012

## 2012-09-05 NOTE — ED Notes (Signed)
Report called to floor RN

## 2012-09-06 ENCOUNTER — Inpatient Hospital Stay (HOSPITAL_COMMUNITY): Payer: Medicare Other

## 2012-09-06 DIAGNOSIS — I4891 Unspecified atrial fibrillation: Secondary | ICD-10-CM

## 2012-09-06 DIAGNOSIS — R4182 Altered mental status, unspecified: Secondary | ICD-10-CM

## 2012-09-06 LAB — BASIC METABOLIC PANEL
BUN: 18 mg/dL (ref 6–23)
CO2: 25 mEq/L (ref 19–32)
Chloride: 104 mEq/L (ref 96–112)
Creatinine, Ser: 1.09 mg/dL (ref 0.50–1.35)
Glucose, Bld: 136 mg/dL — ABNORMAL HIGH (ref 70–99)

## 2012-09-06 LAB — CBC
HCT: 36.4 % — ABNORMAL LOW (ref 39.0–52.0)
MCV: 91 fL (ref 78.0–100.0)
RBC: 4 MIL/uL — ABNORMAL LOW (ref 4.22–5.81)
WBC: 11.8 10*3/uL — ABNORMAL HIGH (ref 4.0–10.5)

## 2012-09-06 MED ORDER — WARFARIN SODIUM 7.5 MG PO TABS
7.5000 mg | ORAL_TABLET | Freq: Once | ORAL | Status: AC
  Administered 2012-09-06: 7.5 mg via ORAL
  Filled 2012-09-06: qty 1

## 2012-09-06 MED ORDER — ALBUTEROL SULFATE (5 MG/ML) 0.5% IN NEBU: 2.5000 mg | INHALATION_SOLUTION | RESPIRATORY_TRACT | Status: DC | PRN

## 2012-09-06 MED ORDER — IPRATROPIUM BROMIDE 0.02 % IN SOLN: 0.5000 mg | RESPIRATORY_TRACT | Status: DC | PRN

## 2012-09-06 MED ORDER — METOPROLOL SUCCINATE ER 25 MG PO TB24
25.0000 mg | ORAL_TABLET | Freq: Every day | ORAL | Status: DC
  Administered 2012-09-06 – 2012-09-07 (×2): 25 mg via ORAL
  Filled 2012-09-06 (×2): qty 1

## 2012-09-06 NOTE — Progress Notes (Signed)
ANTICOAGULATION CONSULT NOTE - Initial Consult  Pharmacy Consult for warfarin Indication: atrial fibrillation  Allergies  Allergen Reactions  . Amoxicillin Itching and Rash  . Penicillins Itching and Rash    Patient Measurements: Height: 5\' 7"  (170.2 cm) Weight: 168 lb 8 oz (76.431 kg) IBW/kg (Calculated) : 66.1   Vital Signs: Temp: 98 F (36.7 C) (01/02 0542) Temp src: Oral (01/02 0542) BP: 102/64 mmHg (01/02 0542) Pulse Rate: 73  (01/02 0542)  Labs:  Basename 09/06/12 0505 09/05/12 1830 09/05/12 1204 09/05/12 0636 09/05/12 0143  HGB 12.0* -- -- -- 13.5  HCT 36.4* -- -- -- 41.4  PLT 48* -- -- -- 62*  APTT -- -- -- -- --  LABPROT 19.8* -- -- -- 21.9*  INR 1.75* -- -- -- 2.00*  HEPARINUNFRC -- -- -- -- --  CREATININE 1.09 -- -- 1.32 1.35  CKTOTAL -- -- -- -- --  CKMB -- -- -- -- --  TROPONINI -- <0.30 <0.30 <0.30 --    Estimated Creatinine Clearance: 47.2 ml/min (by C-G formula based on Cr of 1.09).  Medications:  Prescriptions prior to admission  Medication Sig Dispense Refill  . albuterol (PROVENTIL HFA;VENTOLIN HFA) 108 (90 BASE) MCG/ACT inhaler Inhale 2 puffs into the lungs every 6 (six) hours as needed. For wheezing or shortness of breath      . aspirin 81 MG tablet Take 81 mg by mouth every morning.       . clonazePAM (KLONOPIN) 0.5 MG tablet Take 0.5-1 mg by mouth at bedtime as needed. For sleep or anxiety      . donepezil (ARICEPT) 5 MG tablet Take 5 mg by mouth at bedtime.      . isosorbide mononitrate (IMDUR) 30 MG 24 hr tablet Take 15 mg by mouth every morning.      . loratadine (CLARITIN) 10 MG tablet Take 10 mg by mouth every morning.       . metoprolol succinate (TOPROL-XL) 25 MG 24 hr tablet Take 25 mg by mouth at bedtime.      . Multiple Vitamin (MULTIVITAMIN) tablet Take 1 tablet by mouth every morning.       . pantoprazole (PROTONIX) 40 MG tablet Take 40 mg by mouth every morning.      Marland Kitchen rOPINIRole (REQUIP) 4 MG tablet Take 4 mg by mouth at  bedtime.      . simvastatin (ZOCOR) 10 MG tablet Take 10 mg by mouth at bedtime.      Marland Kitchen warfarin (COUMADIN) 5 MG tablet Take 5-7.5 mg by mouth at bedtime. Take 7.5mg  Monday, Wednesday, and Friday. Take 5mg  each other day of the week.      Marland Kitchen EPINEPHrine (EPI-PEN) 0.3 mg/0.3 mL DEVI Inject 0.3 mg into the muscle once as needed. For severe allergic reaction        Assessment:  79 yo M with Hx of A.Fib on chronic anticoagulation with warfarin.  Admitted with SOB and UTI  Warfarin Dose PTA = 7.5 mg on MWF and 5 mg on TTSS  INR on admission is therapeutic at 2, but decreased today to 1.75  H/H wnl  Platelets low at 48k (hx of thrombocytopenia diagnosed in 09/2011, will monitor). Also on aspirin 81mg  daily.  Goal of Therapy:  INR 2-3    Plan:   Warfarin 7.5 mg tonight at 1800 - higher than home dose  Daily PT/INR  Monitor thrombocytopenia  Rollene Fare PharmD Pager #: [phone] 9:12 AM 09/06/2012

## 2012-09-06 NOTE — Progress Notes (Signed)
Dr. Sharl Ma in to see pt. Md aware pt recently walked with RA sats noted to drop as low as 83%. No distress. Currently sitting on side of bed with dyspnea noted and sats 96% on RA. Wife at bedside.

## 2012-09-06 NOTE — Progress Notes (Signed)
Subjective: Patient seen and examined, much more alert , communicating. Wants to go home, the urine culture is growing E coli, the sensitivity is pending.  Objective: Vital signs in last 24 hours: Temp:  [98 F (36.7 C)-99.5 F (37.5 C)] 98 F (36.7 C) (01/02 0542) Pulse Rate:  [73-98] 75  (01/02 0937) Resp:  [20-21] 20  (01/02 0542) BP: (102-111)/(64-70) 102/64 mmHg (01/02 0542) SpO2:  [98 %-100 %] 100 % (01/02 0937) Weight change:  Last BM Date: 09/05/12      Intake/Output from previous day: 01/01 0701 - 01/02 0700 In: 3075 [P.O.:240; I.V.:2835] Out: 800 [Urine:800] Total I/O In: -  Out: 200 [Urine:200]   Physical Exam: Head: Normocephalic, atraumatic.  Eyes: No signs of jaundice, EOMI Nose: Mucous membranes dry.  Neck: supple,No deformities, masses, or tenderness noted. Lungs: Normal respiratory effort. B/L Clear to auscultation, no crackles or wheezes.  Heart: Regular RR. S1 and S2 normal  Abdomen: BS normoactive. Soft, Nondistended, non-tender.  Extremities: No pretibial edema, no erythema   Lab Results: Basic Metabolic Panel:  Basename 09/06/12 0505 09/05/12 0636  NA 136 134*  K 3.8 3.8  CL 104 100  CO2 25 26  GLUCOSE 136* 104*  BUN 18 21  CREATININE 1.09 1.32  CALCIUM 8.6 8.6  MG -- 1.8  PHOS -- 1.8*   Liver Function Tests:  [facility] 09/05/12 0636  AST 81*  ALT 35  ALKPHOS 79  BILITOT 1.8*  PROT 6.1  ALBUMIN 3.0*   No results found for this basename: LIPASE:2,AMYLASE:2 in the last 72 hours No results found for this basename: AMMONIA:2 in the last 72 hours CBC:  Basename 09/06/12 0505 09/05/12 0143  WBC 11.8* 14.0*  NEUTROABS -- --  HGB 12.0* 13.5  HCT 36.4* 41.4  MCV 91.0 92.4  PLT 48* 62*   Cardiac Enzymes:  Basename 09/05/12 1830 09/05/12 1204 09/05/12 0636  CKTOTAL -- -- --  CKMB -- -- --  CKMBINDEX -- -- --  TROPONINI <0.30 <0.30 <0.30   BNP: No results found for this basename: PROBNP:3 in the last 72 hours D-Dimer: No  results found for this basename: DDIMER:2 in the last 72 hours CBG:  Basename 09/05/12 0118  GLUCAP 92   Hemoglobin A1C:  Basename 09/05/12 0636  HGBA1C 6.3*   Fasting Lipid Panel: No results found for this basename: CHOL,HDL,LDLCALC,TRIG,CHOLHDL,LDLDIRECT in the last 72 hours Thyroid Function Tests:  Basename 09/05/12 0636  TSH 1.229  T4TOTAL --  FREET4 --  T3FREE --  THYROIDAB --   Anemia Panel: No results found for this basename: VITAMINB12,FOLATE,FERRITIN,TIBC,IRON,RETICCTPCT in the last 72 hours Coagulation:  Basename 09/06/12 0505 09/05/12 0143  LABPROT 19.8* 21.9*  INR 1.75* 2.00*   Urine Drug Screen: Drugs of Abuse  No results found for this basename: labopia, cocainscrnur, labbenz, amphetmu, thcu, labbarb    Alcohol Level: No results found for this basename: ETH:2 in the last 72 hours Urinalysis:  Basename 09/05/12 0117  COLORURINE AMBER*  LABSPEC 1.021  PHURINE 5.5  GLUCOSEU NEGATIVE  HGBUR LARGE*  BILIRUBINUR NEGATIVE  KETONESUR 15*  PROTEINUR 100*  UROBILINOGEN 1.0  NITRITE POSITIVE*  LEUKOCYTESUR LARGE*    Recent Results (from the past 240 hour(s))  URINE CULTURE     Status: Normal (Preliminary result)   Collection Time   09/05/12  1:17 AM      Component Value Range Status Comment   Specimen Description URINE, CLEAN CATCH   Final    Special Requests NONE   Final  Culture  Setup Time 09/05/2012 14:36   Final    Colony Count >=100,000 COLONIES/ML   Final    Culture ESCHERICHIA COLI   Final    Report Status PENDING   Incomplete     Studies/Results: Dg Chest 2 View  09/05/2012  *RADIOLOGY REPORT*  Clinical Data: Shortness of breath  CHEST - 2 VIEW  Comparison: 09/05/2012, earlier the same day  Findings: The lungs are clear without focal infiltrate, edema, pneumothorax or pleural effusion. Hyperexpansion is consistent with emphysema. The cardiopericardial silhouette is enlarged.  Dual lead left-sided permanent pacemaker remains in place.  The  patient is status post median sternotomy. Telemetry leads overlie the chest.  IMPRESSION: Stable.  Cardiomegaly with emphysema.  No acute cardiopulmonary process.   Original Report Authenticated By: Kennith Center, M.D.    Ct Head Wo Contrast  09/05/2012  *RADIOLOGY REPORT*  Clinical Data: Weakness, altered mental status  CT HEAD WITHOUT CONTRAST  Technique:  Contiguous axial images were obtained from the base of the skull through the vertex without contrast.  Comparison: Head CT 01/09/2009  Findings: No acute intracranial hemorrhage.  No focal mass lesion. No acute infarction.  There is a diffuse cortical atrophy and ventricular dilatation which is not changed from prior.  Extensive periventricular subcortical white matter hypodensities also unchanged.  Paranasal sinuses and mastoid air cells are clear.  Orbits are normal.  IMPRESSION:  1.  No acute intracranial findings. 2.  Extensive atrophy and microvascular disease not changed from prior.   Original Report Authenticated By: Genevive Bi, M.D.    Dg Chest Portable 1 View  09/05/2012  *RADIOLOGY REPORT*  Clinical Data: Weakness  PORTABLE CHEST - 1 VIEW  Comparison: Chest radiograph 05/17/2011  Findings: Left-sided pacemaker with two continuous leads overlies stable cardiac silhouette.  No effusion, infiltrate, or pneumothorax.  Lungs are hyperinflated.  There is mild bronchitic change.  IMPRESSION:  1.  No acute cardiopulmonary findings. 2.  Emphysematous change.   Original Report Authenticated By: Genevive Bi, M.D.     Medications: Scheduled Meds:   . antiseptic oral rinse  15 mL Mouth Rinse BID  . aspirin EC  81 mg Oral q morning - 10a  . cefTRIAXone (ROCEPHIN) 1 g IVPB  1 g Intravenous QHS  . donepezil  5 mg Oral QHS  . pantoprazole  40 mg Oral Daily  . rOPINIRole  4 mg Oral QHS  . simvastatin  10 mg Oral QHS  . warfarin  7.5 mg Oral ONCE-1800  . Warfarin - Pharmacist Dosing Inpatient   Does not apply q1800   Continuous Infusions:    . sodium chloride 10 mL/hr at 09/06/12 1116   PRN Meds:.acetaminophen, acetaminophen, albuterol, clonazePAM, haloperidol lactate, ondansetron (ZOFRAN) IV, ondansetron    Principal Problem:  *UTI (lower urinary tract infection) Active Problems:  HYPOTHYROIDISM  HYPERLIPIDEMIA  HYPERTENSION, BENIGN  CAD  COPD  GERD  BACK PAIN, CHRONIC  Shortness of breath  Altered mental status  UTI Urine culture growing E Coli, sensitivities pending Continue rocephin  Dyspnea Improved Patient has COPD Will start duoneb nebulizer q 4 hr prn  Atrial fibrillation: rate controlled. Continue coumadin.   Will restart Toprol xl  CAD s/p CABG: Continue ASA, zocor.   Restless leg syndrome: Continue ropinirole.     LOS: 1 day  Assessment/Plan: [facility] Pager: [phone] 09/06/2012, 12:22 PM

## 2012-09-07 LAB — URINE CULTURE

## 2012-09-07 LAB — PROTIME-INR: INR: 2.44 — ABNORMAL HIGH (ref 0.00–1.49)

## 2012-09-07 MED ORDER — WARFARIN SODIUM 4 MG PO TABS
4.0000 mg | ORAL_TABLET | Freq: Once | ORAL | Status: DC
  Filled 2012-09-07: qty 1

## 2012-09-07 MED ORDER — CIPROFLOXACIN HCL 500 MG PO TABS: 500.0000 mg | ORAL_TABLET | Freq: Two times a day (BID) | ORAL | Status: DC

## 2012-09-07 NOTE — Progress Notes (Signed)
ANTICOAGULATION CONSULT NOTE - Initial Consult  Pharmacy Consult for warfarin Indication: atrial fibrillation  Allergies  Allergen Reactions  . Amoxicillin Itching and Rash  . Penicillins Itching and Rash    Patient Measurements: Height: 5\' 7"  (170.2 cm) Weight: 168 lb 8 oz (76.431 kg) IBW/kg (Calculated) : 66.1   Vital Signs: Temp: 97.8 F (36.6 C) (01/03 0600) Temp src: Oral (01/03 0600) BP: 125/78 mmHg (01/03 0600) Pulse Rate: 76  (01/03 0600)  Labs:  Basename 09/07/12 0455 09/06/12 0505 09/05/12 1830 09/05/12 1204 09/05/12 0636 09/05/12 0143  HGB -- 12.0* -- -- -- 13.5  HCT -- 36.4* -- -- -- 41.4  PLT -- 48* -- -- -- 62*  APTT -- -- -- -- -- --  LABPROT 25.4* 19.8* -- -- -- 21.9*  INR 2.44* 1.75* -- -- -- 2.00*  HEPARINUNFRC -- -- -- -- -- --  CREATININE -- 1.09 -- -- 1.32 1.35  CKTOTAL -- -- -- -- -- --  CKMB -- -- -- -- -- --  TROPONINI -- -- <0.30 <0.30 <0.30 --    Estimated Creatinine Clearance: 47.2 ml/min (by C-G formula based on Cr of 1.09).  Medications:  Prescriptions prior to admission  Medication Sig Dispense Refill  . albuterol (PROVENTIL HFA;VENTOLIN HFA) 108 (90 BASE) MCG/ACT inhaler Inhale 2 puffs into the lungs every 6 (six) hours as needed. For wheezing or shortness of breath      . aspirin 81 MG tablet Take 81 mg by mouth every morning.       . clonazePAM (KLONOPIN) 0.5 MG tablet Take 0.5-1 mg by mouth at bedtime as needed. For sleep or anxiety      . donepezil (ARICEPT) 5 MG tablet Take 5 mg by mouth at bedtime.      . isosorbide mononitrate (IMDUR) 30 MG 24 hr tablet Take 15 mg by mouth every morning.      . loratadine (CLARITIN) 10 MG tablet Take 10 mg by mouth every morning.       . metoprolol succinate (TOPROL-XL) 25 MG 24 hr tablet Take 25 mg by mouth at bedtime.      . Multiple Vitamin (MULTIVITAMIN) tablet Take 1 tablet by mouth every morning.       . pantoprazole (PROTONIX) 40 MG tablet Take 40 mg by mouth every morning.      Marland Kitchen  rOPINIRole (REQUIP) 4 MG tablet Take 4 mg by mouth at bedtime.      . simvastatin (ZOCOR) 10 MG tablet Take 10 mg by mouth at bedtime.      Marland Kitchen warfarin (COUMADIN) 5 MG tablet Take 5-7.5 mg by mouth at bedtime. Take 7.5mg  Monday, Wednesday, and Friday. Take 5mg  each other day of the week.      Marland Kitchen EPINEPHrine (EPI-PEN) 0.3 mg/0.3 mL DEVI Inject 0.3 mg into the muscle once as needed. For severe allergic reaction        Assessment:  79 yo M with Hx of A.Fib on chronic anticoagulation with warfarin.  Admitted with SOB and UTI  Warfarin Dose PTA = 7.5 mg on MWF and 5 mg on TTSS; INR on admission was therapeutic  INR today is within range with increase from yesterday (2.44 < 1.75), will give lower dose tonight   Platelets low at 48k (hx of thrombocytopenia diagnosed in 09/2011, will monitor). Also on aspirin 81mg  daily - will discuss with physician.   Goal of Therapy:  INR 2-3    Plan:   Warfarin 4 mg tonight at 1800  Daily PT/INR  Monitor thrombocytopenia  Clydene Fake PharmD Pager #: [phone] 9:29 AM 09/07/2012

## 2012-09-07 NOTE — Progress Notes (Addendum)
Pt given klonopin 0.5mg  tablet @2307  to sleep. At 0300 pt's son stated that the pt was talking about things that were not in the room. Pt assessed, pt alert and orientated x3. MD notified. MD stated to continue to monitor the pt. Pt normally takes this medication at home.

## 2012-09-07 NOTE — Discharge Summary (Signed)
Physician Discharge Summary  [name] [name] [name]:[id number] DOB: 1925/10/26 DOA: 09/05/2012  PCP: Oliver Barre, MD  Admit date: 09/05/2012 Discharge date: 09/07/2012  Time spent: 50  minutes  Recommendations for Outpatient Follow-up:  1. Follow up PCP in one week  Discharge Diagnoses:  Principal Problem:  *UTI (lower urinary tract infection) Active Problems:  HYPOTHYROIDISM  HYPERLIPIDEMIA  HYPERTENSION, BENIGN  CAD  COPD  GERD  BACK PAIN, CHRONIC  Shortness of breath  Altered mental status   Discharge Condition: Stable  Diet recommendation: Low salt diet  Filed Weights   09/05/12 0500  Weight: 76.431 kg (168 lb 8 oz)    History of present illness:  Patient is an 79 year old man with past medical history most significant for coronary artery disease, hypertension, thyroid disease, COPD, hyperlipidemia, atrial fibrillation, type II AV block status post pacemaker and on chronic anticoagulation who comes in with chief complaints of shortness of breath. Patient was in [facility] 3 days ago where his shortness of breath started. Patient flew to [facility] and it was about a 2 hour flight. His shortness of breath stayed about the same as last 3 days. This morning patient was moving some heavy boxes. His shortness of breath became worse and he lay down to get some rest. He was not able to get up and was staring blankly at that time when family got scared and called EMS. As per EMS report the patient was alert oriented x4 and was able to move all 4 extremities. Patient has problems with urination and tells me that that is because he does not drink much water. He has no other complaints at this time.  Hospital Course:   UTI Patient's urine culture grew E coli . Patient was started on Rocephin in the hospital. He improved with antibiotics, and will be sent home on cipro  500 mg po bid x 7 days as e coli is sensitive to cipro.  Altered mental status Secondary to UTI, now  resolved.  Dyspnea Patient has emphysema, and the O2 sats were dropping on walking less than 88%. Will send him home on Home oxygen.  Atrial fibrillation: rate controlled. Continue coumadin.  Continue Toprol xl   CAD s/p CABG: Continue ASA, zocor.   Restless leg syndrome: Continue ropinirole.     Procedures: None Consultations:  None  Discharge Exam: Filed Vitals:   09/06/12 0937 09/06/12 1300 09/06/12 2200 09/07/12 0600  BP:  116/78 121/70 125/78  Pulse: 75 78 80 76  Temp:  98.4 F (36.9 C) 98.1 F (36.7 C) 97.8 F (36.6 C)  TempSrc:  Oral Oral Oral  Resp:  18 20 18   Height:      Weight:      SpO2: 100% 93% 100% 100%    General: Appear in no acute distress Cardiovascular: s1s2 RRR Respiratory: Clear bilaterally EXt; No edema  Discharge Instructions  Discharge Orders    Future Appointments: Provider: Department: Dept Phone: Center:   09/11/2012 11:30 AM [facility] [phone] [facility]   10/16/2012 11:00 AM Herby Abraham, MD [facility] Main Office [place]) [phone] [facility]   10/29/2012 11:05 AM [facility] [employer] Main Office [place]) [phone] [facility]   11/27/2012 1:30 PM Corwin Levins, MD [facility] [phone] [facility]   01/15/2013 10:00 AM [facility] - [phone] [facility]   01/15/2013 10:40 AM Evern Bio, NP [facility] [phone] [facility]  Future Orders Please Complete By Expires   Diet - low sodium heart healthy      Increase activity slowly      Discharge instructions      Comments:   Follow up PCP to check PT/INR in one week       Medication List     As of 09/07/2012  1:51 PM    TAKE these medications         albuterol 108 (90 BASE) MCG/ACT inhaler   Commonly known as: PROVENTIL HFA;VENTOLIN HFA   Inhale 2 puffs into the lungs every 6 (six) hours  as needed. For wheezing or shortness of breath      aspirin 81 MG tablet   Take 81 mg by mouth every morning.      ciprofloxacin 500 MG tablet   Commonly known as: CIPRO   Take 1 tablet (500 mg total) by mouth 2 (two) times daily.      clonazePAM 0.5 MG tablet   Commonly known as: KLONOPIN   Take 0.5-1 mg by mouth at bedtime as needed. For sleep or anxiety      donepezil 5 MG tablet   Commonly known as: ARICEPT   Take 5 mg by mouth at bedtime.      EPINEPHrine 0.3 mg/0.3 mL Devi   Commonly known as: EPI-PEN   Inject 0.3 mg into the muscle once as needed. For severe allergic reaction      isosorbide mononitrate 30 MG 24 hr tablet   Commonly known as: IMDUR   Take 15 mg by mouth every morning.      loratadine 10 MG tablet   Commonly known as: CLARITIN   Take 10 mg by mouth every morning.      metoprolol succinate 25 MG 24 hr tablet   Commonly known as: TOPROL-XL   Take 25 mg by mouth at bedtime.      multivitamin tablet   Take 1 tablet by mouth every morning.      pantoprazole 40 MG tablet   Commonly known as: PROTONIX   Take 40 mg by mouth every morning.      rOPINIRole 4 MG tablet   Commonly known as: REQUIP   Take 4 mg by mouth at bedtime.      simvastatin 10 MG tablet   Commonly known as: ZOCOR   Take 10 mg by mouth at bedtime.      warfarin 5 MG tablet   Commonly known as: COUMADIN   Take 5-7.5 mg by mouth at bedtime. Take 7.5mg  Monday, Wednesday, and Friday. Take 5mg  each other day of the week.           Follow-up Information    Follow up with Oliver Barre, MD. In 1 week.   Contact information:   [address] [place] [address] [phone]           The results of significant diagnostics from this hospitalization (including imaging, microbiology, ancillary and laboratory) are listed below for reference.    Significant Diagnostic Studies: Dg Chest 2 View  09/05/2012  *RADIOLOGY REPORT*  Clinical Data: Shortness  of breath  CHEST - 2 VIEW  Comparison: 09/05/2012, earlier the same day  Findings: The lungs are clear without focal infiltrate, edema, pneumothorax or pleural effusion. Hyperexpansion is consistent with emphysema. The cardiopericardial silhouette is enlarged.  Dual lead left-sided permanent pacemaker remains in place.  The patient is status post median sternotomy. Telemetry leads overlie  the chest.  IMPRESSION: Stable.  Cardiomegaly with emphysema.  No acute cardiopulmonary process.   Original Report Authenticated By: Kennith Center, M.D.    Dg Hip 1 View Right  09/06/2012  *RADIOLOGY REPORT*  Clinical Data: Right hip pain.  RIGHT HIP - 1 VIEW  Comparison: 10/01/2008  Findings: The proximal femur and right hip joint appear normal. Density overlying the right side of the symphysis pubis is felt to be the sacrum and coccyx.  IMPRESSION: Normal right hip.   Original Report Authenticated By: Francene Boyers, M.D.    Ct Head Wo Contrast  09/05/2012  *RADIOLOGY REPORT*  Clinical Data: Weakness, altered mental status  CT HEAD WITHOUT CONTRAST  Technique:  Contiguous axial images were obtained from the base of the skull through the vertex without contrast.  Comparison: Head CT 01/09/2009  Findings: No acute intracranial hemorrhage.  No focal mass lesion. No acute infarction.  There is a diffuse cortical atrophy and ventricular dilatation which is not changed from prior.  Extensive periventricular subcortical white matter hypodensities also unchanged.  Paranasal sinuses and mastoid air cells are clear.  Orbits are normal.  IMPRESSION:  1.  No acute intracranial findings. 2.  Extensive atrophy and microvascular disease not changed from prior.   Original Report Authenticated By: Genevive Bi, M.D.    Dg Chest Portable 1 View  09/05/2012  *RADIOLOGY REPORT*  Clinical Data: Weakness  PORTABLE CHEST - 1 VIEW  Comparison: Chest radiograph 05/17/2011  Findings: Left-sided pacemaker with two continuous leads overlies stable  cardiac silhouette.  No effusion, infiltrate, or pneumothorax.  Lungs are hyperinflated.  There is mild bronchitic change.  IMPRESSION:  1.  No acute cardiopulmonary findings. 2.  Emphysematous change.   Original Report Authenticated By: Genevive Bi, M.D.     Microbiology: Recent Results (from the past 240 hour(s))  URINE CULTURE     Status: Normal   Collection Time   09/05/12  1:17 AM      Component Value Range Status Comment   Specimen Description URINE, CLEAN CATCH   Final    Special Requests NONE   Final    Culture  Setup Time 09/05/2012 14:36   Final    Colony Count >=100,000 COLONIES/ML   Final    Culture ESCHERICHIA COLI   Final    Report Status 09/07/2012 FINAL   Final    Organism ID, Bacteria ESCHERICHIA COLI   Final      Labs: Basic Metabolic Panel:  Lab 09/06/12 1610 09/05/12 0636 09/05/12 0143  NA 136 134* 135  K 3.8 3.8 4.6  CL 104 100 100  CO2 25 26 26   GLUCOSE 136* 104* 92  BUN 18 21 23   CREATININE 1.09 1.32 1.35  CALCIUM 8.6 8.6 9.1  MG -- 1.8 --  PHOS -- 1.8* --   Liver Function Tests:  Lab 09/05/12 0636  AST 81*  ALT 35  ALKPHOS 79  BILITOT 1.8*  PROT 6.1  ALBUMIN 3.0*   No results found for this basename: LIPASE:5,AMYLASE:5 in the last 168 hours No results found for this basename: AMMONIA:5 in the last 168 hours CBC:  Lab 09/06/12 0505 09/05/12 0143  WBC 11.8* 14.0*  NEUTROABS -- --  HGB 12.0* 13.5  HCT 36.4* 41.4  MCV 91.0 92.4  PLT 48* 62*   Cardiac Enzymes:  Lab 09/05/12 1830 09/05/12 1204 09/05/12 0636 09/05/12 0143  CKTOTAL -- -- -- --  CKMB -- -- -- --  CKMBINDEX -- -- -- --  TROPONINI <0.30 <0.30 <  0.30 <0.30   BNP: BNP (last 3 results) No results found for this basename: PROBNP:3 in the last 8760 hours CBG:  Lab 09/05/12 0118  GLUCAP 92       Signed:  Eleina Jergens S  [facility] 09/07/2012, 1:51 PM

## 2012-09-07 NOTE — Progress Notes (Signed)
Patient being discharged home.  Discharge instructions and scripts given to significant other and grandson.  No further questions at this time.  O2 has been delivered.  Informed family to call home care for oxygen set up when arrived home.

## 2012-09-07 NOTE — Progress Notes (Addendum)
For home O2 requirements, patient sat 99% on RA at rest, 82% on RA while ambulating, and 99% on 3L while ambulating

## 2012-09-11 ENCOUNTER — Ambulatory Visit (INDEPENDENT_AMBULATORY_CARE_PROVIDER_SITE_OTHER): Payer: Medicare Other | Admitting: Internal Medicine

## 2012-09-11 ENCOUNTER — Encounter: Payer: Self-pay | Admitting: Internal Medicine

## 2012-09-11 ENCOUNTER — Ambulatory Visit (INDEPENDENT_AMBULATORY_CARE_PROVIDER_SITE_OTHER): Payer: Medicare Other | Admitting: General Practice

## 2012-09-11 VITALS — BP 142/74 | HR 98 | Temp 98.7°F | Wt 174.2 lb

## 2012-09-11 DIAGNOSIS — Z87898 Personal history of other specified conditions: Secondary | ICD-10-CM

## 2012-09-11 DIAGNOSIS — I4891 Unspecified atrial fibrillation: Secondary | ICD-10-CM

## 2012-09-11 DIAGNOSIS — J449 Chronic obstructive pulmonary disease, unspecified: Secondary | ICD-10-CM

## 2012-09-11 DIAGNOSIS — N476 Balanoposthitis: Secondary | ICD-10-CM

## 2012-09-11 DIAGNOSIS — N390 Urinary tract infection, site not specified: Secondary | ICD-10-CM

## 2012-09-11 DIAGNOSIS — N481 Balanitis: Secondary | ICD-10-CM | POA: Insufficient documentation

## 2012-09-11 LAB — POCT INR: INR: 2.9

## 2012-09-11 MED ORDER — LEVOFLOXACIN 250 MG PO TABS: 250.0000 mg | ORAL_TABLET | Freq: Every day | ORAL | Status: DC

## 2012-09-11 MED ORDER — MOMETASONE FURO-FORMOTEROL FUM 200-5 MCG/ACT IN AERO: 2.0000 | INHALATION_SPRAY | Freq: Two times a day (BID) | RESPIRATORY_TRACT | Status: DC

## 2012-09-11 MED ORDER — TAMSULOSIN HCL 0.4 MG PO CAPS: 0.4000 mg | ORAL_CAPSULE | Freq: Every day | ORAL | Status: DC

## 2012-09-11 MED ORDER — FLUCONAZOLE 100 MG PO TABS: 100.0000 mg | ORAL_TABLET | Freq: Every day | ORAL | Status: DC

## 2012-09-11 NOTE — Assessment & Plan Note (Addendum)
prob fungal - for diflucan/lotrimin asd,  to f/u any worsening symptoms or concerns  Note:  Total time for pt hx, exam, review of record with pt in the room, determination of diagnoses and plan for further eval and tx is > 40 min, with over 50% spent in coordination and counseling of patient

## 2012-09-11 NOTE — Assessment & Plan Note (Signed)
Gave dulera sample, Continue all other medications as before, pt to call if improved, and needs rx

## 2012-09-11 NOTE — Assessment & Plan Note (Signed)
Ok to re-start flomax, declines urology for now

## 2012-09-11 NOTE — Progress Notes (Signed)
Subjective:    Patient ID: [name] [name], male    DOB: [birth year], 79 y.o.   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 No abd pain, flank pain, fever or penile d/c.  Also incidentally using the proventil sometimes more than 4 times per day, did not like the advair previous.   Past Medical History  Diagnosis Date  . CAD (coronary artery disease)   . Hypertension   . Vitamin D deficiency   . Chronic back pain   . Thyroid disease   . Anemia   . ED (erectile dysfunction)   . Arthritis   . RLS (restless legs syndrome)   . GERD (gastroesophageal reflux disease)   . COPD (chronic obstructive pulmonary disease)   . Hyperlipidemia   . Atrial fibrillation   . Second degree Mobitz II AV block     s/p PPM  . Asthma 09/15/2011  . Long term current use of anticoagulant 09/15/2011  . Thrombocytopenia 09/15/2011   Past Surgical History  Procedure Date  . Coronary artery bypass graft   . Cataract extraction   . Pacemaker insertion     reports that he has quit smoking. He has never used smokeless tobacco. He reports that he drinks alcohol. He reports that he does not use illicit drugs. family history includes Diabetes in his sister. Allergies  Allergen Reactions  . Amoxicillin Itching and Rash  . Penicillins Itching and Rash   Current Outpatient Prescriptions on File Prior to Visit  Medication Sig Dispense Refill  . albuterol (PROVENTIL HFA;VENTOLIN HFA) 108 (90 BASE) MCG/ACT inhaler Inhale 2 puffs into the lungs every 6 (six) hours as needed.  For wheezing or shortness of breath      . aspirin 81 MG tablet Take 81 mg by mouth every morning.       . clonazePAM (KLONOPIN) 0.5 MG tablet Take 0.5-1 mg by mouth at bedtime as needed. For sleep or anxiety      . donepezil (ARICEPT) 5 MG tablet Take 5 mg by mouth at bedtime.      Marland Kitchen EPINEPHrine (EPI-PEN) 0.3 mg/0.3 mL DEVI Inject 0.3 mg into the muscle once as needed. For severe allergic reaction      . isosorbide mononitrate (IMDUR) 30 MG 24 hr tablet Take 15 mg by mouth every morning.      . loratadine (CLARITIN) 10 MG tablet Take 10 mg by mouth every morning.       . metoprolol succinate (TOPROL-XL) 25 MG 24 hr tablet Take 25 mg by mouth at bedtime.      . Multiple Vitamin (MULTIVITAMIN) tablet Take 1 tablet by mouth every morning.       . pantoprazole (PROTONIX) 40 MG tablet Take 40 mg by mouth every morning.      Marland Kitchen rOPINIRole (REQUIP) 4 MG tablet Take 4 mg by mouth at bedtime.      . simvastatin (ZOCOR) 10 MG tablet Take 10 mg by mouth at bedtime.      Marland Kitchen warfarin (COUMADIN) 5 MG tablet Take 5-7.5 mg  by mouth at bedtime. Take 7.5mg  Monday, Wednesday, and Friday. Take 5mg  each other day of the week.      . mometasone-formoterol (DULERA) 200-5 MCG/ACT AERO Inhale 2 puffs into the lungs 2 (two) times daily.  13 g  5  . [DISCONTINUED] pantoprazole (PROTONIX) 40 MG tablet Take 1 tablet (40 mg total) by mouth daily.  90 tablet  3   Review of Systems  Constitutional: Negative for diaphoresis and unexpected weight change.  HENT: Negative for tinnitus.   Eyes: Negative for photophobia and visual disturbance.  Respiratory: Negative for choking and stridor.   Gastrointestinal: Negative for vomiting and blood in stool.  Genitourinary: Negative for hematuria and decreased urine volume.  Musculoskeletal: Negative for gait problem.  Skin: Negative for color change and wound.  Neurological: Negative for tremors and numbness.  Psychiatric/Behavioral: Negative for decreased concentration. The patient  is not hyperactive.       Objective:   Physical Exam BP 142/74  Pulse 98  Temp 98.7 F (37.1 C) (Oral)  Wt 174 lb 4 oz (79.039 kg)  SpO2 98% Physical Exam  VS noted Constitutional: Pt appears well-developed and well-nourished.  HENT: Head: Normocephalic.  Right Ear: External ear normal.  Left Ear: External ear normal.  Eyes: Conjunctivae and EOM are normal. Pupils are equal, round, and reactive to light.  Neck: Normal range of motion. Neck supple.  Cardiovascular: Normal rate and regular rhythm.   Pulmonary/Chest: Effort normal and breath sounds decreased, no wheeze, rales.  Abd:  Soft, NT, non-distended, + BS Neurological: Pt is alert.Has baseline confusion GU: normal scrotal contents Glans penis with 2-3+ red/tender/swelling without d/c, circumcised, penile shaft without red/tender/swelling/ulcer Psychiatric: Pt behavior is normal. Thought content normal.     Assessment & Plan:

## 2012-09-11 NOTE — Patient Instructions (Addendum)
OK to stop the cipro antibiotic Take all new medications as prescribed - the levaquin and diflucan (2 antibiotics) Please also use Lotrimin OTC at least twice per day for the next 7-10 days Your flomax is re-started today OK to try the Dulera 200 at 2 puffs twice per day to see if this helps Continue all other medications as before OK to cancel the Jan 9 appointment

## 2012-09-11 NOTE — Assessment & Plan Note (Signed)
Ok to change cipro to levaquin in case of bacterial cause given the degree of inflammation of glans penis

## 2012-09-12 ENCOUNTER — Ambulatory Visit: Payer: Medicare Other | Admitting: Internal Medicine

## 2012-09-13 ENCOUNTER — Ambulatory Visit: Payer: Medicare Other | Admitting: Neurosurgery

## 2012-09-13 ENCOUNTER — Ambulatory Visit: Payer: Medicare Other | Admitting: Internal Medicine

## 2012-09-28 ENCOUNTER — Telehealth: Payer: Self-pay | Admitting: Internal Medicine

## 2012-09-28 DIAGNOSIS — D72829 Elevated white blood cell count, unspecified: Secondary | ICD-10-CM

## 2012-09-28 DIAGNOSIS — D72820 Lymphocytosis (symptomatic): Secondary | ICD-10-CM

## 2012-09-28 NOTE — Telephone Encounter (Signed)
Chart review of recent labs c/w mild leukocytosis with predom lymphocytosis; etiology unclear  For Hematology referral;   Zella Ball to let pt know

## 2012-10-01 ENCOUNTER — Telehealth: Payer: Self-pay | Admitting: Oncology

## 2012-10-01 NOTE — Telephone Encounter (Signed)
Patient informed. 

## 2012-10-01 NOTE — Telephone Encounter (Signed)
C/D 10/01/12 for appt.10/10/12

## 2012-10-01 NOTE — Telephone Encounter (Signed)
S/W PT IN REF TO NP APPT. ON 10/10/12@3 :00 REFERRING DR Jonny Ruiz DX-LEUKOCYTOSIS MAILED NP PACKET

## 2012-10-02 ENCOUNTER — Encounter: Payer: Self-pay | Admitting: Oncology

## 2012-10-02 ENCOUNTER — Other Ambulatory Visit: Payer: Self-pay | Admitting: Oncology

## 2012-10-02 DIAGNOSIS — D696 Thrombocytopenia, unspecified: Secondary | ICD-10-CM

## 2012-10-02 DIAGNOSIS — D72820 Lymphocytosis (symptomatic): Secondary | ICD-10-CM | POA: Insufficient documentation

## 2012-10-04 ENCOUNTER — Other Ambulatory Visit: Payer: Self-pay | Admitting: Internal Medicine

## 2012-10-09 ENCOUNTER — Encounter (INDEPENDENT_AMBULATORY_CARE_PROVIDER_SITE_OTHER): Payer: Medicare Other | Admitting: General Practice

## 2012-10-09 DIAGNOSIS — I4891 Unspecified atrial fibrillation: Secondary | ICD-10-CM

## 2012-10-10 ENCOUNTER — Telehealth: Payer: Self-pay | Admitting: Oncology

## 2012-10-10 ENCOUNTER — Ambulatory Visit: Payer: Medicare Other

## 2012-10-10 ENCOUNTER — Other Ambulatory Visit (HOSPITAL_COMMUNITY)
Admission: RE | Admit: 2012-10-10 | Discharge: 2012-10-10 | Disposition: A | Discharge status: Home / Self Care | Payer: Medicare Other | Source: Ambulatory Visit | Attending: Oncology | Admitting: Oncology

## 2012-10-10 ENCOUNTER — Other Ambulatory Visit (HOSPITAL_BASED_OUTPATIENT_CLINIC_OR_DEPARTMENT_OTHER): Payer: Medicare Other | Admitting: Lab

## 2012-10-10 ENCOUNTER — Ambulatory Visit (HOSPITAL_BASED_OUTPATIENT_CLINIC_OR_DEPARTMENT_OTHER): Payer: Medicare Other | Admitting: Oncology

## 2012-10-10 ENCOUNTER — Encounter: Payer: Self-pay | Admitting: Oncology

## 2012-10-10 VITALS — BP 104/65 | HR 91 | Temp 96.7°F | Resp 18 | Ht 67.0 in | Wt 163.8 lb

## 2012-10-10 DIAGNOSIS — D6959 Other secondary thrombocytopenia: Secondary | ICD-10-CM

## 2012-10-10 DIAGNOSIS — D72820 Lymphocytosis (symptomatic): Secondary | ICD-10-CM | POA: Insufficient documentation

## 2012-10-10 DIAGNOSIS — Z7901 Long term (current) use of anticoagulants: Secondary | ICD-10-CM

## 2012-10-10 DIAGNOSIS — D696 Thrombocytopenia, unspecified: Secondary | ICD-10-CM | POA: Insufficient documentation

## 2012-10-10 DIAGNOSIS — F1010 Alcohol abuse, uncomplicated: Secondary | ICD-10-CM

## 2012-10-10 LAB — CBC WITH DIFFERENTIAL/PLATELET
BASO%: 0.3 % (ref 0.0–2.0)
Basophils Absolute: 0 10*3/uL (ref 0.0–0.1)
EOS%: 0.7 % (ref 0.0–7.0)
LYMPH%: 60.5 % — ABNORMAL HIGH (ref 14.0–49.0)
MCV: 89.8 fL (ref 79.3–98.0)
MONO#: 0.3 10*3/uL (ref 0.1–0.9)
NEUT#: 3.9 10*3/uL (ref 1.5–6.5)
NEUT%: 35.4 % — ABNORMAL LOW (ref 39.0–75.0)
RBC: 4.1 10*6/uL — ABNORMAL LOW (ref 4.20–5.82)
RDW: 15.2 % — ABNORMAL HIGH (ref 11.0–14.6)
WBC: 11.1 10*3/uL — ABNORMAL HIGH (ref 4.0–10.3)

## 2012-10-10 LAB — TSH: TSH: 0.822 u[IU]/mL (ref 0.350–4.500)

## 2012-10-10 LAB — TECHNOLOGIST REVIEW

## 2012-10-10 NOTE — Progress Notes (Signed)
[name]  [phone] [phone]     INITIAL HEMATOLOGY CONSULTATION    Referral MD:  Dr. Corwin Levins, M.D.   Reason for Referral: lymphocytosis.     HPI: [name] is a 79 year-old man with history of EtOH.  He drinks about 2-3 shots daily.  He was noted to have persistent lymphocytosis.  He was thus kindly referred to the [name] for evaluation.  [name] presented to the Clinic with a family friend today.  He reported feeling well.  He had one episode of syncope when he stood up too fast a few weeks ago.  He also recently had UTI.  However, he denied recurrent infection.  He has low appetite but stable weight.  He is still active with chores around the house. His skin over the extensor surfaces of his arm are bruises from taking Coumadin. He denied visible source of bleeding. He has mild SOB from COPD.  He does not like to use home O2.   Patient denies fever, weight loss, fatigue, headache, visual changes, confusion, drenching night sweats, palpable lymph node swelling, mucositis, odynophagia, dysphagia, nausea vomiting, jaundice, chest pain, palpitation, dyspnea on exertion, productive cough, gum bleeding, epistaxis, hematemesis, hemoptysis, abdominal pain, abdominal swelling, early satiety, melena, hematochezia, hematuria, spontaneous bleeding, joint swelling, joint pain, heat or cold intolerance, bowel bladder incontinence, back pain, focal motor weakness, paresthesia, depression, suicidal or homicidal ideation, feeling hopelessness.     Past Medical History  Diagnosis Date  . CAD (coronary artery disease)   . Hypertension   . Vitamin D deficiency   . Chronic back pain   . Anemia   . ED (erectile dysfunction)   . Arthritis   . RLS (restless legs syndrome)   . GERD (gastroesophageal reflux disease)   . COPD (chronic obstructive pulmonary disease)   . Hyperlipidemia   . Atrial fibrillation   . Second degree Mobitz II  AV block     s/p PPM  . Asthma 09/15/2011  . Long term current use of anticoagulant 09/15/2011  . Thrombocytopenia 09/15/2011  . Lymphocytosis   :    Past Surgical History  Procedure Date  . Coronary artery bypass graft   . Cataract extraction   . Pacemaker insertion   :   CURRENT MEDS: Current Outpatient Prescriptions  Medication Sig Dispense Refill  . albuterol (PROVENTIL HFA;VENTOLIN HFA) 108 (90 BASE) MCG/ACT inhaler Inhale 2 puffs into the lungs every 6 (six) hours as needed. For wheezing or shortness of breath      . aspirin 81 MG tablet Take 81 mg by mouth every morning.       Marland Kitchen azelastine (ASTELIN) 137 MCG/SPRAY nasal spray Place 1 spray into the nose 2 (two) times daily. Use in each nostril as directed      . clonazePAM (KLONOPIN) 0.5 MG tablet Take 0.5-1 mg by mouth at bedtime as needed. For sleep or anxiety      . donepezil (ARICEPT) 5 MG tablet Take 5 mg by mouth at bedtime.      Marland Kitchen EPINEPHrine (EPI-PEN) 0.3 mg/0.3 mL DEVI Inject 0.3 mg into the muscle once as needed. For severe allergic reaction      . isosorbide mononitrate (IMDUR) 30 MG 24 hr tablet Take 15 mg by mouth every morning.      . loratadine (CLARITIN) 10 MG tablet Take 10 mg by mouth every morning.       . mometasone-formoterol (DULERA) 200-5 MCG/ACT AERO  Inhale 2 puffs into the lungs 2 (two) times daily.  13 g  5  . Multiple Vitamin (MULTIVITAMIN) tablet Take 1 tablet by mouth every morning.       . pantoprazole (PROTONIX) 40 MG tablet Take 40 mg by mouth every morning.      Marland Kitchen rOPINIRole (REQUIP) 4 MG tablet Take 4 mg by mouth at bedtime.      . simvastatin (ZOCOR) 10 MG tablet TAKE 1 TABLET AT BEDTIME  90 tablet  3  . Tamsulosin HCl (FLOMAX) 0.4 MG CAPS Take 1 capsule (0.4 mg total) by mouth daily.  90 capsule  3  . warfarin (COUMADIN) 5 MG tablet Take 5 mg by mouth at bedtime.       . metoprolol succinate (TOPROL-XL) 25 MG 24 hr tablet Take 25 mg by mouth at bedtime.          Allergies  Allergen  Reactions  . Amoxicillin Itching and Rash  . Penicillins Itching and Rash  :  Family History  Problem Relation Age of Onset  . Diabetes Sister   . Cancer Mother     lung cancer  . Stroke Father   . Cancer Brother     brain cancer  . Alcohol abuse Brother   :  History   Social History  . Marital Status: Widowed    Spouse Name: N/A    Number of Children: 2  . Years of Education: N/A   Occupational History  .      retired Animator   Social History Main Topics  . Smoking status: Former Games developer  . Smokeless tobacco: Never Used  . Alcohol Use: Yes  . Drug Use: No  . Sexually Active: Not Currently   Other Topics Concern  . Not on file   Social History Narrative  . No narrative on file  :  REVIEW OF SYSTEM:  The rest of the 14-point review of sytem was negative.   Exam: ECOG 1.   General:  Thin-appearing man, in no acute distress.  Eyes:  no scleral icterus.  ENT:  There were no oropharyngeal lesions.  Neck was without thyromegaly.  Lymphatics:  Negative cervical, supraclavicular or axillary adenopathy.  Respiratory: lungs were clear bilaterally without wheezing or crackles.  Cardiovascular:  Irregularly irregular, S1/S2, without murmur, rub or gallop.  There was no pedal edema.  GI:  abdomen was soft, flat, nontender, nondistended, withou organomegaly.  Muscoloskeletal:  no spinal tenderness of palpation of vertebral spine.  Skin exam was without echymosis, petichae.  Neuro exam was nonfocal.  Patient was able to get on and off exam table without assistance.  Gait was normal.  Patient was alerted and oriented.  Attention was good.   Language was appropriate.  Mood was normal without depression.  Speech was not pressured.  Thought content was not tangential.    LABS:  Lab Results  Component Value Date   WBC 11.1* 10/10/2012   HGB 12.1* 10/10/2012   HCT 36.8* 10/10/2012   PLT 124* 10/10/2012   GLUCOSE 136* 09/06/2012   CHOL 131 03/14/2012   TRIG 88.0 03/14/2012   HDL  52.90 03/14/2012   LDLCALC 61 03/14/2012   ALT 35 09/05/2012   AST 81* 09/05/2012   NA 136 09/06/2012   K 3.8 09/06/2012   CL 104 09/06/2012   CREATININE 1.09 09/06/2012   BUN 18 09/06/2012   CO2 25 09/06/2012   PSA 3.15 11/25/2009   INR 4.4 10/09/2012   HGBA1C 6.3* 09/05/2012  Blood smear review:   I personally reviewed the patient's peripheral blood smear today.  There was isocytosis.  There was no peripheral blast. There was elevated WBC and lymphocytes.  There were occasional smudge cells.  There was no schistocytosis, spherocytosis, target cell, rouleaux formation, tear drop cell.  There was no giant platelets or platelet clumps.      ASSESSMENT AND PLAN:   1.  CAD:  He is on ASA; isosorbide, metoprolol, and simvastatin.  2.  COPD:  He no longer smokes.  He is on inhaler prn. 3.  Afib:  He is on metoprolol and COumadin. 4.  EtOH:  I advised him to minimize his EtOH intake due to his age and thrombocytopenia. 5.  Thrombocytopenia:  Due to EtOH.  I have lower suspicion for CLL causing thrombocytopenia due to lack of concurrent anemia.  6.  Lymphocytosis: - Could be due to chronic lymphocytic leukemia (CLL) or monoclonal B-cell lymphocytosis of undetermined significance vs a low grade, indolent lymphoma.  - Work up:  I sent for peripheral blood flow cytometry confirm CLL. - If this turns out to be CLL, CLL are treated with chemo in the following situations:  *  Very large lymph nodes.  *  Recurrent infections.  *  Severe constitutional symptoms (fatigue, sweat, weight loss).  *  Anemia or low platelet count that is not due to CLL's autoimmune complication.   - Potential options for chemo in CLL if needed to be treated in the elderly:  *  Rituxan (IV) + chlorambucil (pill)  *  Ibrutinib:  A new chemo pill approved by the FDA recently for patients with CLL who had tried other therapies and not responding.    *  Both are very easy on the body.  Ibrutinib may be difficult to obtain since it's only  for patients who had failed other therapies.   -  Recommendations:  I sent blood for flow cytometry which is a blood test to confirm whether or not he has CLL.  If it turns out to be CLL, at this time, there is no indication to treat.  Repeat blood test here at the [facility] in about 4 months and return visit in about 8 months.    Mr. Delapena and his family friend expressed informed understanding and agreed with watchful observation.     The length of time of the face-to-face encounter was 45  minutes. More than 50% of time was spent counseling and coordination of care.     Thank you for this referral.

## 2012-10-10 NOTE — Patient Instructions (Addendum)
1.  Issue:  Elevated lymphocyte and low platelet count. 2.  Could be due to chronic lymphocytic leukemia (CLL) + or - Alcohol use. 3.  Work up:  Blood test to confirm CLL. 4.  CLL are treated with chemo in the following situations:  *  Very large lymph nodes.  *  Recurrent infections.  *  Severe constitutional symptoms (fatigue, sweat, weight loss).  *  Anemia or low platelet count that is not due to CLL's autoimmune complication.   5. Potential options for chemo:  *  Rituxan (IV) + chlorambucil (pill)  *  Ibrutinib:  A new chemo pill approved by the FDA recently for patients with CLL who had tried other therapies and not responding.    *  Both are very easy on the body.  Ibrutinib may be difficult to obtain since it's only for patients who had failed other therapies.   6.  Recommendations:  I sent blood for flow cytometry which is a blood test to confirm whether or not you have CLL.  If it turns out to be CLL, at this time, there is no indication to treat.  Repeat blood test here at the [facility] in about 4 months and return visit in about 8 months.

## 2012-10-10 NOTE — Telephone Encounter (Signed)
Gave pt appt for lab and MD for June and October 2014

## 2012-10-15 ENCOUNTER — Telehealth: Payer: Self-pay | Admitting: *Deleted

## 2012-10-15 ENCOUNTER — Encounter: Payer: Self-pay | Admitting: Oncology

## 2012-10-15 ENCOUNTER — Other Ambulatory Visit: Payer: Self-pay | Admitting: Oncology

## 2012-10-15 DIAGNOSIS — C8590 Non-Hodgkin lymphoma, unspecified, unspecified site: Secondary | ICD-10-CM

## 2012-10-15 NOTE — Telephone Encounter (Signed)
S.O. Called states pt got a call about a test?  Informed her of CT scan ordered for 2/19 and gave her number for CT dept for specific instructions. She verbalized understanding.

## 2012-10-16 ENCOUNTER — Ambulatory Visit: Payer: Medicare Other | Admitting: Cardiology

## 2012-10-22 ENCOUNTER — Ambulatory Visit (INDEPENDENT_AMBULATORY_CARE_PROVIDER_SITE_OTHER): Payer: Medicare Other | Admitting: Cardiology

## 2012-10-22 ENCOUNTER — Encounter: Payer: Self-pay | Admitting: Cardiology

## 2012-10-22 ENCOUNTER — Ambulatory Visit (INDEPENDENT_AMBULATORY_CARE_PROVIDER_SITE_OTHER): Payer: Medicare Other | Admitting: Pharmacist

## 2012-10-22 VITALS — BP 118/64 | HR 70 | Ht 69.0 in | Wt 160.0 lb

## 2012-10-22 DIAGNOSIS — R0989 Other specified symptoms and signs involving the circulatory and respiratory systems: Secondary | ICD-10-CM

## 2012-10-22 DIAGNOSIS — E785 Hyperlipidemia, unspecified: Secondary | ICD-10-CM

## 2012-10-22 DIAGNOSIS — I25810 Atherosclerosis of coronary artery bypass graft(s) without angina pectoris: Secondary | ICD-10-CM

## 2012-10-22 DIAGNOSIS — I714 Abdominal aortic aneurysm, without rupture: Secondary | ICD-10-CM

## 2012-10-22 DIAGNOSIS — D72820 Lymphocytosis (symptomatic): Secondary | ICD-10-CM

## 2012-10-22 DIAGNOSIS — I4891 Unspecified atrial fibrillation: Secondary | ICD-10-CM

## 2012-10-22 DIAGNOSIS — I4821 Permanent atrial fibrillation: Secondary | ICD-10-CM

## 2012-10-22 DIAGNOSIS — I2510 Atherosclerotic heart disease of native coronary artery without angina pectoris: Secondary | ICD-10-CM

## 2012-10-22 LAB — POCT INR: INR: 2.4

## 2012-10-22 MED ORDER — WARFARIN SODIUM 5 MG PO TABS: 5.0000 mg | ORAL_TABLET | Freq: Every day | ORAL | Status: DC

## 2012-10-22 MED ORDER — ISOSORBIDE MONONITRATE ER 30 MG PO TB24: 15.0000 mg | ORAL_TABLET | Freq: Every morning | ORAL | Status: DC

## 2012-10-22 NOTE — Patient Instructions (Addendum)
Your physician wants you to follow-up in: November with Dr Johney Frame.  You will receive a reminder letter in the mail two months in advance. If you don't receive a letter, please call our office to schedule the follow-up appointment.  Your physician recommends that you continue on your current medications as directed. Please refer to the Current Medication list given to you today.

## 2012-10-22 NOTE — Progress Notes (Signed)
HPI:  Patient is in for a followup visit. We had a discussion today about his overall clinical situation. He's recently been seen by hematology. The patient does admit to one to 2 drinks per day, and his significant other is with him and suggests that up until recently his drinking was perhaps more like 4-5 drinks per day. The patient denies any ongoing chest pain. Overall he seems to be doing reasonably well given his age, and extensive underlying problems. They do admit that his memory is not as good, and that he perhaps might be a tiny bit more unsteady at this point. His significant other is getting ready to move out of his home, and his daughter is likely to move in according to both of them.  Current Outpatient Prescriptions  Medication Sig Dispense Refill  . albuterol (PROVENTIL HFA;VENTOLIN HFA) 108 (90 BASE) MCG/ACT inhaler Inhale 2 puffs into the lungs every 6 (six) hours as needed. For wheezing or shortness of breath      . aspirin 81 MG tablet Take 81 mg by mouth every morning.       Marland Kitchen azelastine (ASTELIN) 137 MCG/SPRAY nasal spray Place 1 spray into the nose 2 (two) times daily. Use in each nostril as directed      . clonazePAM (KLONOPIN) 0.5 MG tablet Take 0.5-1 mg by mouth at bedtime as needed. For sleep or anxiety      . donepezil (ARICEPT) 5 MG tablet Take 5 mg by mouth at bedtime.      Marland Kitchen EPINEPHrine (EPI-PEN) 0.3 mg/0.3 mL DEVI Inject 0.3 mg into the muscle once as needed. For severe allergic reaction      . isosorbide mononitrate (IMDUR) 30 MG 24 hr tablet Take 0.5 tablets (15 mg total) by mouth every morning.  45 tablet  3  . loratadine (CLARITIN) 10 MG tablet Take 10 mg by mouth every morning.       . metoprolol succinate (TOPROL-XL) 25 MG 24 hr tablet Take 25 mg by mouth at bedtime.      . mometasone-formoterol (DULERA) 200-5 MCG/ACT AERO Inhale 2 puffs into the lungs 2 (two) times daily.  13 g  5  . Multiple Vitamin (MULTIVITAMIN) tablet Take 1 tablet by mouth every morning.        . pantoprazole (PROTONIX) 40 MG tablet Take 40 mg by mouth every morning.      Marland Kitchen rOPINIRole (REQUIP) 4 MG tablet Take 4 mg by mouth at bedtime.      . simvastatin (ZOCOR) 10 MG tablet TAKE 1 TABLET AT BEDTIME  90 tablet  3  . Tamsulosin HCl (FLOMAX) 0.4 MG CAPS Take 1 capsule (0.4 mg total) by mouth daily.  90 capsule  3  . warfarin (COUMADIN) 5 MG tablet Take 1 tablet (5 mg total) by mouth at bedtime.  90 tablet  1   No current facility-administered medications for this visit.    Allergies  Allergen Reactions  . Amoxicillin Itching and Rash  . Penicillins Itching and Rash    Past Medical History  Diagnosis Date  . CAD (coronary artery disease)   . Hypertension   . Vitamin D deficiency   . Chronic back pain   . Anemia   . ED (erectile dysfunction)   . Arthritis   . RLS (restless legs syndrome)   . GERD (gastroesophageal reflux disease)   . COPD (chronic obstructive pulmonary disease)   . Hyperlipidemia   . Atrial fibrillation   . Second degree Mobitz II  AV block     s/p PPM  . Asthma 09/15/2011  . Long term current use of anticoagulant 09/15/2011  . Thrombocytopenia 09/15/2011  . Lymphocytosis     Past Surgical History  Procedure Laterality Date  . Coronary artery bypass graft    . Cataract extraction    . Pacemaker insertion      Family History  Problem Relation Age of Onset  . Diabetes Sister   . Cancer Mother     lung cancer  . Stroke Father   . Cancer Brother     brain cancer  . Alcohol abuse Brother     History   Social History  . Marital Status: Widowed    Spouse Name: N/A    Number of Children: 2  . Years of Education: N/A   Occupational History  .      retired Animator   Social History Main Topics  . Smoking status: Former Games developer  . Smokeless tobacco: Never Used  . Alcohol Use: Yes  . Drug Use: No  . Sexually Active: Not Currently   Other Topics Concern  . Not on file   Social History Narrative  . No narrative on file     ROS: Please see the HPI.  All other systems reviewed and negative.  PHYSICAL EXAM:  BP 118/64  Pulse 70  Ht 5\' 9"  (1.753 m)  Wt 160 lb (72.576 kg)  BMI 23.62 kg/m2  SpO2 99%  General: Well developed, well nourished, in no acute distress. Head:  Normocephalic and atraumatic. Neck: no JVD Lungs: decrease BS bilaterally with prolonged expiration.   Heart: Normal S1 and S2.  No murmur, rubs or gallops.  Pulses: Pulses normal in all 4 extremities. Extremities: No clubbing or cyanosis. No edema. Neurologic: Alert and oriented x 3.  EKG:  Ventricular pacing.  Underlying atrial fibrillation.   ASSESSMENT AND PLAN:

## 2012-10-23 ENCOUNTER — Other Ambulatory Visit: Payer: Self-pay | Admitting: *Deleted

## 2012-10-23 ENCOUNTER — Other Ambulatory Visit: Payer: Self-pay | Admitting: General Practice

## 2012-10-23 MED ORDER — WARFARIN SODIUM 5 MG PO TABS: 5.0000 mg | ORAL_TABLET | Freq: Every day | ORAL | Status: DC

## 2012-10-24 ENCOUNTER — Ambulatory Visit (HOSPITAL_COMMUNITY)
Admission: RE | Admit: 2012-10-24 | Discharge: 2012-10-24 | Disposition: A | Discharge status: Home / Self Care | Payer: Medicare Other | Source: Ambulatory Visit | Attending: Oncology | Admitting: Oncology

## 2012-10-24 ENCOUNTER — Encounter (HOSPITAL_COMMUNITY): Payer: Self-pay

## 2012-10-24 DIAGNOSIS — I714 Abdominal aortic aneurysm, without rupture, unspecified: Secondary | ICD-10-CM | POA: Insufficient documentation

## 2012-10-24 DIAGNOSIS — C8589 Other specified types of non-Hodgkin lymphoma, extranodal and solid organ sites: Secondary | ICD-10-CM | POA: Insufficient documentation

## 2012-10-24 DIAGNOSIS — K5730 Diverticulosis of large intestine without perforation or abscess without bleeding: Secondary | ICD-10-CM | POA: Insufficient documentation

## 2012-10-24 DIAGNOSIS — C8590 Non-Hodgkin lymphoma, unspecified, unspecified site: Secondary | ICD-10-CM

## 2012-10-24 MED ORDER — IOHEXOL 300 MG/ML  SOLN
100.0000 mL | Freq: Once | INTRAMUSCULAR | Status: AC | PRN
  Administered 2012-10-24: 100 mL via INTRAVENOUS

## 2012-10-29 ENCOUNTER — Ambulatory Visit (INDEPENDENT_AMBULATORY_CARE_PROVIDER_SITE_OTHER): Payer: Medicare Other | Admitting: *Deleted

## 2012-10-29 ENCOUNTER — Other Ambulatory Visit: Payer: Self-pay | Admitting: Internal Medicine

## 2012-10-29 ENCOUNTER — Telehealth: Payer: Self-pay | Admitting: Dietician

## 2012-10-29 ENCOUNTER — Encounter: Payer: Self-pay | Admitting: Internal Medicine

## 2012-10-29 DIAGNOSIS — Z950 Presence of cardiac pacemaker: Secondary | ICD-10-CM

## 2012-10-29 DIAGNOSIS — I441 Atrioventricular block, second degree: Secondary | ICD-10-CM

## 2012-10-29 NOTE — Telephone Encounter (Signed)
Patient has been identified to be at risk on malnutrition screen.  Wt Readings from Last 10 Encounters:  10/22/12 160 lb (72.576 kg)  10/10/12 163 lb 12.8 oz (74.299 kg)  09/11/12 174 lb 4 oz (79.039 kg)  09/05/12 168 lb 8 oz (76.431 kg)  08/14/12 176 lb 1.3 oz (79.869 kg)  07/26/12 176 lb (79.833 kg)  07/11/12 178 lb 9.6 oz (81.012 kg)  07/02/12 175 lb 2 oz (79.436 kg)  05/28/12 174 lb (78.926 kg)  05/17/12 174 lb 4 oz (79.039 kg)    Called patient with no answer.  No message service.     Oran Rein, RD, LDN

## 2012-10-31 ENCOUNTER — Other Ambulatory Visit: Payer: Self-pay | Admitting: Oncology

## 2012-10-31 ENCOUNTER — Encounter: Payer: Self-pay | Admitting: Oncology

## 2012-10-31 DIAGNOSIS — R918 Other nonspecific abnormal finding of lung field: Secondary | ICD-10-CM

## 2012-11-01 LAB — REMOTE PACEMAKER DEVICE
ATRIAL PACING PM: 1
BAMS-0001: 150 {beats}/min
BAMS-0003: 70 {beats}/min
BRDY-0002RV: 60 {beats}/min
BRDY-0003RV: 120 {beats}/min
DEVICE MODEL PM: 2326107
RV LEAD IMPEDENCE PM: 410 Ohm

## 2012-11-02 DIAGNOSIS — I4821 Permanent atrial fibrillation: Secondary | ICD-10-CM | POA: Insufficient documentation

## 2012-11-02 NOTE — Assessment & Plan Note (Signed)
Persists in atrial fib with back up pacing.  Followed in coumadin clinic.

## 2012-11-02 NOTE — Assessment & Plan Note (Signed)
Last LDL was 61

## 2012-11-02 NOTE — Assessment & Plan Note (Signed)
4.7 by 4.8 when checked in November.  Being followed at [facility].

## 2012-11-02 NOTE — Assessment & Plan Note (Signed)
Now being seen in the [facility] clinic by Dr. Gaylyn Rong.  Possible early CLL, but monitoring without meds for now.

## 2012-11-02 NOTE — Assessment & Plan Note (Signed)
As noted.  Mild bilateral disease by doppler.  Should be due later for repeat study.

## 2012-11-02 NOTE — Assessment & Plan Note (Signed)
No current symptoms.  Has SVG disease.  Remains on single ASA with stent in SVG.  However, also on warfarin, and has had falls with alcohol in recent past with clinical hematoma.  Will need to decide going forward, in light of findings, of continued use.  Platelet count is lower, but could be up now as he has curtailed some of his drinking.

## 2012-11-05 ENCOUNTER — Telehealth: Payer: Self-pay | Admitting: *Deleted

## 2012-11-05 ENCOUNTER — Telehealth: Payer: Self-pay | Admitting: Oncology

## 2012-11-05 NOTE — Telephone Encounter (Signed)
Please call pt's daughter.  I did talk with him last week.  I explained to him that there were findings in the lung that could be infection or cancer.  I could not tell from the CT scan.  I referred him to [facility] for evaluation.  Thanks.

## 2012-11-05 NOTE — Telephone Encounter (Signed)
Pt called to ask about why he has appt to see Pulmonary MD,  Dr. Marchelle Gearing.  Daughter, Dennie Bible, also on phone w/ pt..   He says he was not told of results of recent CT scan.  Informed CT showed some lesions on his lungs and I am not qualified to say what this might be except Dr. Gaylyn Rong referred him to Pulmonary for his opinion.  Pt says if it is cancer than he would not want chemo any ways,  So does not think he needs this appt..  Encouraged pt that it may be a good idea to at least meet w/ Pulmonary as it might not be cancer and there may be some treatment pt may benefit.    Gave pt and daughter phone number for Dr. Marchelle Gearing office and instructed to call if he will cancel appt.  Encouraged pt to keep appt w/ his PCP on 3/25 and discuss this with him before canceling Pulmonary visit.  Pt and daughter verbalized understanding.

## 2012-11-05 NOTE — Telephone Encounter (Signed)
s.w. pt and advised on 3.26.14 appt...emailed Dr. Gaylyn Rong Emailed Dr. Gaylyn Rong that Dr. Marchelle Gearing was the only physician that had an availability.  However, the appt was on 3.26.14 @ 3:15pm.  Dr. Vassie Loll did not have anything this month.  Is this ok?  The pt is scheduled

## 2012-11-05 NOTE — Telephone Encounter (Signed)
gv referral to [facility]. to fax to [phone]

## 2012-11-07 ENCOUNTER — Encounter: Payer: Self-pay | Admitting: *Deleted

## 2012-11-07 ENCOUNTER — Telehealth: Payer: Self-pay | Admitting: *Deleted

## 2012-11-07 NOTE — Telephone Encounter (Signed)
Called daughter, Dennie Bible and informed that Dr. Gaylyn Rong told the pt about referral to Pulmonary,  The scan showed some findings which could be cancer or infection.  Pat verbalized understanding and says pt is coughing a lot lately and she plans on taking pt to Pulmonary appt as scheduled.

## 2012-11-13 ENCOUNTER — Ambulatory Visit (INDEPENDENT_AMBULATORY_CARE_PROVIDER_SITE_OTHER): Payer: Medicare Other | Admitting: General Practice

## 2012-11-13 DIAGNOSIS — I4891 Unspecified atrial fibrillation: Secondary | ICD-10-CM

## 2012-11-13 LAB — POCT INR: INR: 2.9

## 2012-11-14 ENCOUNTER — Telehealth: Payer: Self-pay | Admitting: Internal Medicine

## 2012-11-14 MED ORDER — METOPROLOL SUCCINATE ER 25 MG PO TB24: 25.0000 mg | ORAL_TABLET | Freq: Every day | ORAL | Status: DC

## 2012-11-14 MED ORDER — PANTOPRAZOLE SODIUM 40 MG PO TBEC: 40.0000 mg | DELAYED_RELEASE_TABLET | Freq: Every morning | ORAL | Status: DC

## 2012-11-14 NOTE — Telephone Encounter (Signed)
Called the daughter and she is taking over the care for this patient trying to update his medication list.  Does he need to be on [facility] as he has none.  Advise

## 2012-11-14 NOTE — Telephone Encounter (Signed)
Yes, should have refills at the pharmacy

## 2012-11-14 NOTE — Telephone Encounter (Signed)
The patient called the triage line hoping to speak with the cma - the callback number is [phone]

## 2012-11-20 ENCOUNTER — Other Ambulatory Visit: Payer: Self-pay

## 2012-11-20 MED ORDER — ROPINIROLE HCL 4 MG PO TABS: 4.0000 mg | ORAL_TABLET | Freq: Every day | ORAL | Status: DC

## 2012-11-20 MED ORDER — CLONAZEPAM 0.5 MG PO TABS: 0.5000 mg | ORAL_TABLET | Freq: Every evening | ORAL | Status: DC | PRN

## 2012-11-20 MED ORDER — MOMETASONE FURO-FORMOTEROL FUM 200-5 MCG/ACT IN AERO: 2.0000 | INHALATION_SPRAY | Freq: Two times a day (BID) | RESPIRATORY_TRACT | Status: DC

## 2012-11-20 NOTE — Telephone Encounter (Signed)
The daughter stated the patient has an expired epipen prescription and would like a refill.  Also needs clonazepam and requip refilled

## 2012-11-20 NOTE — Telephone Encounter (Signed)
Faxed hardcopy to pharmacy. 

## 2012-11-20 NOTE — Telephone Encounter (Signed)
Done hardcopy to robin  

## 2012-11-27 ENCOUNTER — Other Ambulatory Visit (INDEPENDENT_AMBULATORY_CARE_PROVIDER_SITE_OTHER): Payer: Medicare Other

## 2012-11-27 ENCOUNTER — Encounter: Payer: Self-pay | Admitting: Internal Medicine

## 2012-11-27 ENCOUNTER — Ambulatory Visit (INDEPENDENT_AMBULATORY_CARE_PROVIDER_SITE_OTHER): Payer: Medicare Other | Admitting: Internal Medicine

## 2012-11-27 VITALS — BP 130/80 | HR 80 | Temp 97.0°F | Ht 69.0 in | Wt 163.0 lb

## 2012-11-27 DIAGNOSIS — I4891 Unspecified atrial fibrillation: Secondary | ICD-10-CM

## 2012-11-27 DIAGNOSIS — I2510 Atherosclerotic heart disease of native coronary artery without angina pectoris: Secondary | ICD-10-CM

## 2012-11-27 DIAGNOSIS — Z0000 Encounter for general adult medical examination without abnormal findings: Secondary | ICD-10-CM

## 2012-11-27 LAB — LIPID PANEL
Cholesterol: 104 mg/dL (ref 0–200)
HDL: 51.6 mg/dL (ref >39.00)
Total CHOL/HDL Ratio: 2
Triglycerides: 46 mg/dL (ref 0.0–149.0)

## 2012-11-27 LAB — BASIC METABOLIC PANEL
BUN: 12 mg/dL (ref 6–23)
CO2: 29 mEq/L (ref 19–32)
Calcium: 9.3 mg/dL (ref 8.4–10.5)
Chloride: 105 mEq/L (ref 96–112)
Creatinine, Ser: 1.1 mg/dL (ref 0.4–1.5)
Glucose, Bld: 101 mg/dL — ABNORMAL HIGH (ref 70–99)

## 2012-11-27 LAB — HEPATIC FUNCTION PANEL
ALT: 19 U/L (ref 0–53)
Albumin: 3.7 g/dL (ref 3.5–5.2)
Total Protein: 6.6 g/dL (ref 6.0–8.3)

## 2012-11-27 LAB — URINALYSIS, ROUTINE W REFLEX MICROSCOPIC
Hgb urine dipstick: NEGATIVE
Nitrite: NEGATIVE
Specific Gravity, Urine: 1.015 (ref 1.000–1.030)
Total Protein, Urine: NEGATIVE
Urine Glucose: NEGATIVE
Urobilinogen, UA: 0.2 (ref 0.0–1.0)

## 2012-11-27 MED ORDER — ISOSORBIDE MONONITRATE ER 30 MG PO TB24: 15.0000 mg | ORAL_TABLET | Freq: Every morning | ORAL | Status: DC

## 2012-11-27 MED ORDER — CLONAZEPAM 0.5 MG PO TABS: 0.5000 mg | ORAL_TABLET | Freq: Every evening | ORAL | Status: DC | PRN

## 2012-11-27 MED ORDER — TAMSULOSIN HCL 0.4 MG PO CAPS: 0.4000 mg | ORAL_CAPSULE | Freq: Every day | ORAL | Status: DC

## 2012-11-27 MED ORDER — SIMVASTATIN 10 MG PO TABS: ORAL_TABLET | ORAL | Status: DC

## 2012-11-27 NOTE — Patient Instructions (Addendum)
Please call if you change your mind about the pulmonary referral Please take all new medication as prescribed - the flonase nasal spray OK to hold off on the epipen prescription for now Please continue all other medications as before, and refills have been done if requested. Please go to the LAB in the Basement (turn left off the elevator) for the tests to be done today You will be contacted by phone if any changes need to be made immediately.  Otherwise, you will receive a letter about your results with an explanation, but please check with MyChart first. Thank you for enrolling in MyChart. Please follow the instructions below to securely access your online medical record. MyChart allows you to send messages to your doctor, view your test results, renew your prescriptions, schedule appointments, and more. To Log into My Chart online, please go by [employer] or [employer] to [email or web address], or download the MyChart App from the [employer] .  If you do not remember any of this information, you will have to contact your MyChart help desk at [phone] to help you regain access to your MyChart account. Please return in 6 months, or sooner if needed

## 2012-11-27 NOTE — Assessment & Plan Note (Signed)

## 2012-11-27 NOTE — Progress Notes (Signed)
Subjective:    Patient ID: [name] [name], male    DOB: 10/24/1925, 79 y.o.   MRN: [id number]  HPI  Here with daughter, Here for wellness and f/u;  Overall doing ok;  Pt denies CP, worsening SOB, DOE, wheezing, orthopnea, PND, worsening LE edema, palpitations, dizziness or syncope.  Pt denies neurological change such as new headache, facial or extremity weakness.  Pt denies polydipsia, polyuria, or low sugar symptoms. Pt states overall good compliance with treatment and medications, good tolerability, and has been trying to follow lower cholesterol diet.  Pt denies worsening depressive symptoms, suicidal ideation or panic. No fever, night sweats, wt loss, loss of appetite, or other constitutional symptoms.  Pt states good ability with ADL's, has low fall risk, home safety reviewed and adequate, no other significant changes in hearing or vision, and only occasionally active with exercise. Does have significant rhinitis symtpoms with large nasal clear drainage off and on. Past Medical History  Diagnosis Date  . CAD (coronary artery disease)   . Hypertension   . Vitamin D deficiency   . Chronic back pain   . Anemia   . ED (erectile dysfunction)   . Arthritis   . RLS (restless legs syndrome)   . GERD (gastroesophageal reflux disease)   . COPD (chronic obstructive pulmonary disease)   . Hyperlipidemia   . Atrial fibrillation   . Second degree Mobitz II AV block     s/p PPM  . Asthma 09/15/2011  . Long term current use of anticoagulant 09/15/2011  . Thrombocytopenia 09/15/2011  . Lymphocytosis    Past Surgical History  Procedure Laterality Date  . Coronary artery bypass graft    . Cataract extraction    . Pacemaker insertion      reports that he has quit smoking. He has never used smokeless tobacco. He reports that  drinks alcohol. He reports that he does not use illicit drugs. family history includes Alcohol abuse in his brother; Cancer in his brother and mother; Diabetes in his sister; and  Stroke in his father. Allergies  Allergen Reactions  . Amoxicillin Itching and Rash  . Penicillins Itching and Rash   Current Outpatient Prescriptions on File Prior to Visit  Medication Sig Dispense Refill  . albuterol (PROVENTIL HFA;VENTOLIN HFA) 108 (90 BASE) MCG/ACT inhaler Inhale 2 puffs into the lungs every 6 (six) hours as needed. For wheezing or shortness of breath      . aspirin 81 MG tablet Take 81 mg by mouth every morning.       Marland Kitchen azelastine (ASTELIN) 137 MCG/SPRAY nasal spray Place 1 spray into the nose 2 (two) times daily. Use in each nostril as directed      . donepezil (ARICEPT) 5 MG tablet Take 5 mg by mouth at bedtime.      Marland Kitchen loratadine (CLARITIN) 10 MG tablet Take 10 mg by mouth every morning.       . metoprolol succinate (TOPROL-XL) 25 MG 24 hr tablet Take 1 tablet (25 mg total) by mouth at bedtime.  90 tablet  3  . mometasone-formoterol (DULERA) 200-5 MCG/ACT AERO Inhale 2 puffs into the lungs 2 (two) times daily.  13 g  11  . Multiple Vitamin (MULTIVITAMIN) tablet Take 1 tablet by mouth every morning.       . pantoprazole (PROTONIX) 40 MG tablet Take 1 tablet (40 mg total) by mouth every morning.  90 tablet  3  . rOPINIRole (REQUIP) 4 MG tablet Take 1 tablet (4 mg total)  by mouth at bedtime.  60 tablet  5  . warfarin (COUMADIN) 5 MG tablet Take 1 tablet (5 mg total) by mouth at bedtime. Take as directed by coumadin clinic.  90 tablet  1   No current facility-administered medications on file prior to visit.   Review of Systems Constitutional: Negative for diaphoresis, activity change, appetite change or unexpected weight change.  HENT: Negative for hearing loss, ear pain, facial swelling, mouth sores and neck stiffness.   Eyes: Negative for pain, redness and visual disturbance.  Respiratory: Negative for shortness of breath and wheezing.   Cardiovascular: Negative for chest pain and palpitations.  Gastrointestinal: Negative for diarrhea, blood in stool, abdominal  distention or other pain Genitourinary: Negative for hematuria, flank pain or change in urine volume.  Musculoskeletal: Negative for myalgias and joint swelling.  Skin: Negative for color change and wound.  Neurological: Negative for syncope and numbness. other than noted Hematological: Negative for adenopathy.  Psychiatric/Behavioral: Negative for hallucinations, self-injury, decreased concentration and agitation.      Objective:   Physical Exam BP 130/80  Pulse 80  Temp(Src) 97 F (36.1 C) (Oral)  Ht 5\' 9"  (1.753 m)  Wt 163 lb (73.936 kg)  BMI 24.06 kg/m2  SpO2 97% VS noted, not ill appearing Constitutional: Pt is oriented to person, place, and time. Appears well-developed and well-nourished.  Head: Normocephalic and atraumatic.  Right Ear: External ear normal.  Left Ear: External ear normal.  Nose: Nose normal.  Mouth/Throat: Oropharynx is clear and moist. with post nasal gtt and cobblestoning Eyes: Conjunctivae and EOM are normal. Pupils are equal, round, and reactive to light.  Neck: Normal range of motion. Neck supple. No JVD present. No tracheal deviation present.  Cardiovascular: Normal rate, regular rhythm, normal heart sounds and intact distal pulses.   Pulmonary/Chest: Effort normal and breath sounds normal.  Abdominal: Soft. Bowel sounds are normal. There is no tenderness. No HSM  Musculoskeletal: Normal range of motion. Exhibits no edema.  Lymphadenopathy:  Has no cervical adenopathy.  Neurological: Pt is alert and oriented to person, place, and time. Pt has normal reflexes. No cranial nerve deficit.  Skin: Skin is warm and dry. No rash noted.  Psychiatric:  Has  normal mood and affect. Behavior is normal. 1+ nervous    Assessment & Plan:

## 2012-11-28 ENCOUNTER — Telehealth: Payer: Self-pay

## 2012-11-28 ENCOUNTER — Institutional Professional Consult (permissible substitution): Payer: Medicare Other | Admitting: Internal Medicine

## 2012-11-28 DIAGNOSIS — I4891 Unspecified atrial fibrillation: Secondary | ICD-10-CM

## 2012-11-28 DIAGNOSIS — I2510 Atherosclerotic heart disease of native coronary artery without angina pectoris: Secondary | ICD-10-CM

## 2012-11-28 LAB — CBC WITH DIFFERENTIAL/PLATELET
Basophils Relative: 0.3 % (ref 0.0–3.0)
Eosinophils Absolute: 0.1 10*3/uL (ref 0.0–0.7)
Hemoglobin: 12.7 g/dL — ABNORMAL LOW (ref 13.0–17.0)
Lymphocytes Relative: 63.6 % — ABNORMAL HIGH (ref 12.0–46.0)
MCHC: 32.8 g/dL (ref 30.0–36.0)
Monocytes Relative: 4.5 % (ref 3.0–12.0)
Neutro Abs: 2.9 10*3/uL (ref 1.4–7.7)
Neutrophils Relative %: 30.2 % — ABNORMAL LOW (ref 43.0–77.0)
RBC: 4.31 Mil/uL (ref 4.22–5.81)
WBC: 9.5 10*3/uL (ref 4.5–10.5)

## 2012-11-28 MED ORDER — ISOSORBIDE MONONITRATE ER 30 MG PO TB24: 15.0000 mg | ORAL_TABLET | Freq: Every morning | ORAL | Status: DC

## 2012-11-28 MED ORDER — WARFARIN SODIUM 5 MG PO TABS: 5.0000 mg | ORAL_TABLET | Freq: Every day | ORAL | Status: DC

## 2012-11-28 MED ORDER — FLUTICASONE PROPIONATE 50 MCG/ACT NA SUSP: 2.0000 | Freq: Every day | NASAL | Status: DC

## 2012-11-28 NOTE — Telephone Encounter (Signed)
informed

## 2012-11-28 NOTE — Telephone Encounter (Signed)
The patients daughter called informing flonase was not at the pharmacy as stated on his AVS.  Please sent to [facility] if ok.

## 2012-11-28 NOTE — Telephone Encounter (Signed)
Very sorry, this was my oversight, and I have now sent to the pharmacy

## 2012-12-03 ENCOUNTER — Encounter (HOSPITAL_COMMUNITY): Payer: Self-pay | Admitting: Emergency Medicine

## 2012-12-03 ENCOUNTER — Emergency Department (HOSPITAL_COMMUNITY)
Admission: EM | Admit: 2012-12-03 | Discharge: 2012-12-03 | Disposition: A | Discharge status: Home / Self Care | Payer: No Typology Code available for payment source | Attending: Emergency Medicine | Admitting: Emergency Medicine

## 2012-12-03 ENCOUNTER — Emergency Department (HOSPITAL_COMMUNITY): Payer: No Typology Code available for payment source

## 2012-12-03 DIAGNOSIS — Z8679 Personal history of other diseases of the circulatory system: Secondary | ICD-10-CM | POA: Insufficient documentation

## 2012-12-03 DIAGNOSIS — G8929 Other chronic pain: Secondary | ICD-10-CM | POA: Insufficient documentation

## 2012-12-03 DIAGNOSIS — Z7982 Long term (current) use of aspirin: Secondary | ICD-10-CM | POA: Insufficient documentation

## 2012-12-03 DIAGNOSIS — Z8669 Personal history of other diseases of the nervous system and sense organs: Secondary | ICD-10-CM | POA: Insufficient documentation

## 2012-12-03 DIAGNOSIS — S43102A Unspecified dislocation of left acromioclavicular joint, initial encounter: Secondary | ICD-10-CM

## 2012-12-03 DIAGNOSIS — J4489 Other specified chronic obstructive pulmonary disease: Secondary | ICD-10-CM | POA: Insufficient documentation

## 2012-12-03 DIAGNOSIS — G44309 Post-traumatic headache, unspecified, not intractable: Secondary | ICD-10-CM | POA: Insufficient documentation

## 2012-12-03 DIAGNOSIS — Z79899 Other long term (current) drug therapy: Secondary | ICD-10-CM | POA: Insufficient documentation

## 2012-12-03 DIAGNOSIS — K219 Gastro-esophageal reflux disease without esophagitis: Secondary | ICD-10-CM | POA: Insufficient documentation

## 2012-12-03 DIAGNOSIS — S79929A Unspecified injury of unspecified thigh, initial encounter: Secondary | ICD-10-CM | POA: Insufficient documentation

## 2012-12-03 DIAGNOSIS — S43109A Unspecified dislocation of unspecified acromioclavicular joint, initial encounter: Secondary | ICD-10-CM | POA: Insufficient documentation

## 2012-12-03 DIAGNOSIS — Z87448 Personal history of other diseases of urinary system: Secondary | ICD-10-CM | POA: Insufficient documentation

## 2012-12-03 DIAGNOSIS — Z862 Personal history of diseases of the blood and blood-forming organs and certain disorders involving the immune mechanism: Secondary | ICD-10-CM | POA: Insufficient documentation

## 2012-12-03 DIAGNOSIS — Y92410 Unspecified street and highway as the place of occurrence of the external cause: Secondary | ICD-10-CM | POA: Insufficient documentation

## 2012-12-03 DIAGNOSIS — J449 Chronic obstructive pulmonary disease, unspecified: Secondary | ICD-10-CM | POA: Insufficient documentation

## 2012-12-03 DIAGNOSIS — I2510 Atherosclerotic heart disease of native coronary artery without angina pectoris: Secondary | ICD-10-CM | POA: Insufficient documentation

## 2012-12-03 DIAGNOSIS — S79919A Unspecified injury of unspecified hip, initial encounter: Secondary | ICD-10-CM | POA: Insufficient documentation

## 2012-12-03 DIAGNOSIS — Y9389 Activity, other specified: Secondary | ICD-10-CM | POA: Insufficient documentation

## 2012-12-03 DIAGNOSIS — Z7901 Long term (current) use of anticoagulants: Secondary | ICD-10-CM | POA: Insufficient documentation

## 2012-12-03 DIAGNOSIS — Z8739 Personal history of other diseases of the musculoskeletal system and connective tissue: Secondary | ICD-10-CM | POA: Insufficient documentation

## 2012-12-03 DIAGNOSIS — I10 Essential (primary) hypertension: Secondary | ICD-10-CM | POA: Insufficient documentation

## 2012-12-03 DIAGNOSIS — IMO0002 Reserved for concepts with insufficient information to code with codable children: Secondary | ICD-10-CM | POA: Insufficient documentation

## 2012-12-03 DIAGNOSIS — Z9861 Coronary angioplasty status: Secondary | ICD-10-CM | POA: Insufficient documentation

## 2012-12-03 DIAGNOSIS — Z87891 Personal history of nicotine dependence: Secondary | ICD-10-CM | POA: Insufficient documentation

## 2012-12-03 DIAGNOSIS — E785 Hyperlipidemia, unspecified: Secondary | ICD-10-CM | POA: Insufficient documentation

## 2012-12-03 DIAGNOSIS — Z8639 Personal history of other endocrine, nutritional and metabolic disease: Secondary | ICD-10-CM | POA: Insufficient documentation
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
## 2012-12-04 ENCOUNTER — Telehealth: Payer: Self-pay | Admitting: Internal Medicine

## 2012-12-04 NOTE — Telephone Encounter (Signed)
Ok to see regina for f/u ER exam
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
## 2012-12-04 NOTE — Telephone Encounter (Signed)
Patient informed and transferred to schedule appointment.

## 2012-12-05 ENCOUNTER — Ambulatory Visit: Payer: Medicare Other | Admitting: Internal Medicine

## 2012-12-05 ENCOUNTER — Encounter: Payer: Self-pay | Admitting: Internal Medicine

## 2012-12-05 ENCOUNTER — Ambulatory Visit (INDEPENDENT_AMBULATORY_CARE_PROVIDER_SITE_OTHER): Payer: Medicare Other | Admitting: Internal Medicine

## 2012-12-05 DIAGNOSIS — Z5189 Encounter for other specified aftercare: Secondary | ICD-10-CM

## 2012-12-05 DIAGNOSIS — S161XXD Strain of muscle, fascia and tendon at neck level, subsequent encounter: Secondary | ICD-10-CM

## 2012-12-05 MED ORDER — TRAMADOL HCL 50 MG PO TABS: 50.0000 mg | ORAL_TABLET | Freq: Three times a day (TID) | ORAL | Status: DC | PRN

## 2012-12-05 MED ORDER — METHOCARBAMOL 500 MG PO TABS: 500.0000 mg | ORAL_TABLET | Freq: Three times a day (TID) | ORAL | Status: DC
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
## 2012-12-10 ENCOUNTER — Ambulatory Visit (INDEPENDENT_AMBULATORY_CARE_PROVIDER_SITE_OTHER): Payer: Medicare Other | Admitting: General Practice

## 2012-12-10 DIAGNOSIS — I4891 Unspecified atrial fibrillation: Secondary | ICD-10-CM

## 2012-12-10 LAB — POCT INR: INR: 2.7

## 2012-12-24 ENCOUNTER — Encounter: Payer: Self-pay | Admitting: *Deleted

## 2012-12-24 ENCOUNTER — Encounter: Payer: Self-pay | Admitting: Internal Medicine

## 2012-12-24 ENCOUNTER — Ambulatory Visit (INDEPENDENT_AMBULATORY_CARE_PROVIDER_SITE_OTHER)
Admission: RE | Admit: 2012-12-24 | Discharge: 2012-12-24 | Disposition: A | Discharge status: Home / Self Care | Payer: Medicare Other | Source: Ambulatory Visit | Attending: Internal Medicine | Admitting: Internal Medicine

## 2012-12-24 ENCOUNTER — Telehealth: Payer: Self-pay

## 2012-12-24 ENCOUNTER — Ambulatory Visit (INDEPENDENT_AMBULATORY_CARE_PROVIDER_SITE_OTHER): Payer: Medicare Other | Admitting: Internal Medicine

## 2012-12-24 ENCOUNTER — Telehealth: Payer: Self-pay | Admitting: Internal Medicine

## 2012-12-24 ENCOUNTER — Other Ambulatory Visit (INDEPENDENT_AMBULATORY_CARE_PROVIDER_SITE_OTHER): Payer: Medicare Other

## 2012-12-24 VITALS — BP 120/68 | HR 72 | Temp 98.2°F | Ht 69.0 in | Wt 166.5 lb

## 2012-12-24 DIAGNOSIS — R9389 Abnormal findings on diagnostic imaging of other specified body structures: Secondary | ICD-10-CM

## 2012-12-24 DIAGNOSIS — I10 Essential (primary) hypertension: Secondary | ICD-10-CM

## 2012-12-24 DIAGNOSIS — J441 Chronic obstructive pulmonary disease with (acute) exacerbation: Secondary | ICD-10-CM

## 2012-12-24 DIAGNOSIS — D72820 Lymphocytosis (symptomatic): Secondary | ICD-10-CM

## 2012-12-24 LAB — CBC WITH DIFFERENTIAL/PLATELET
Basophils Absolute: 0 10*3/uL (ref 0.0–0.1)
Eosinophils Absolute: 0 10*3/uL (ref 0.0–0.7)
MCHC: 32 g/dL (ref 30.0–36.0)
MCV: 91.7 fl (ref 78.0–100.0)
Monocytes Absolute: 0.3 10*3/uL (ref 0.1–1.0)
Neutrophils Relative %: 34.4 % — ABNORMAL LOW (ref 43.0–77.0)
Platelets: 81 10*3/uL — ABNORMAL LOW (ref 150.0–400.0)
RDW: 15.4 % — ABNORMAL HIGH (ref 11.5–14.6)
WBC: 8.1 10*3/uL (ref 4.5–10.5)

## 2012-12-24 MED ORDER — TIOTROPIUM BROMIDE MONOHYDRATE 18 MCG IN CAPS: 18.0000 ug | ORAL_CAPSULE | Freq: Every day | RESPIRATORY_TRACT | Status: DC

## 2012-12-24 MED ORDER — METHYLPREDNISOLONE ACETATE 80 MG/ML IJ SUSP
80.0000 mg | Freq: Once | INTRAMUSCULAR | Status: AC
  Administered 2012-12-24: 80 mg via INTRAMUSCULAR

## 2012-12-24 MED ORDER — METHYLPREDNISOLONE ACETATE 80 MG/ML IJ SUSP: 80.0000 mg | Freq: Once | INTRAMUSCULAR | Status: DC

## 2012-12-24 MED ORDER — FUROSEMIDE 40 MG PO TABS: 20.0000 mg | ORAL_TABLET | Freq: Every day | ORAL | Status: DC

## 2012-12-24 MED ORDER — PREDNISONE 10 MG PO TABS: ORAL_TABLET | ORAL | Status: DC

## 2012-12-24 MED ORDER — MOMETASONE FURO-FORMOTEROL FUM 200-5 MCG/ACT IN AERO: 2.0000 | INHALATION_SPRAY | Freq: Two times a day (BID) | RESPIRATORY_TRACT | Status: DC

## 2012-12-24 MED ORDER — ALBUTEROL SULFATE HFA 108 (90 BASE) MCG/ACT IN AERS: 2.0000 | INHALATION_SPRAY | Freq: Four times a day (QID) | RESPIRATORY_TRACT | Status: DC | PRN

## 2012-12-24 NOTE — Telephone Encounter (Signed)
ro robin - Lasix rx sent in for 40 mg  - 1/2 qd, but remind pt/friend to take the Whole pill for the first 3 days

## 2012-12-24 NOTE — Telephone Encounter (Signed)
Ok to see regina now

## 2012-12-24 NOTE — Progress Notes (Signed)
Subjective:    Patient ID: [name] [name], male    DOB: Feb 18, 1926, 79 y.o.   MRN: [id number]  HPI  Here to f/u as acute walking without appt, with daughter from [place] who drove; pt with dementia but c/o sob/doe and asking for eval for oxygen.  Pt denies chest pain, wheezing, orthopnea, PND, increased LE swelling, palpitations, dizziness or syncope, but has a sense of not being able to get his breath, now walking room to room, ventolin inhaler not helping this time except for 1-2 hours at a time only,  No fever, cough, ST, HA.  No falls.  No hx of dvt/pe.  Not taking his dulera. Did not f/u with pulm regarding feb 2014 CT chest, but now willing to do so.  Saw Dr Gaylyn Rong feb 2014 with plan to f/u in October, with lymphocytosis.  Has card f/u later this month Past Medical History  Diagnosis Date  . CAD (coronary artery disease)   . Hypertension   . Vitamin D deficiency   . Chronic back pain   . Anemia   . ED (erectile dysfunction)   . Arthritis   . RLS (restless legs syndrome)   . GERD (gastroesophageal reflux disease)   . COPD (chronic obstructive pulmonary disease)   . Hyperlipidemia   . Atrial fibrillation   . Second degree Mobitz II AV block     s/p PPM  . Asthma 09/15/2011  . Long term current use of anticoagulant 09/15/2011  . Thrombocytopenia 09/15/2011  . Lymphocytosis    Past Surgical History  Procedure Laterality Date  . Coronary artery bypass graft    . Cataract extraction    . Pacemaker insertion      reports that he has quit smoking. He has never used smokeless tobacco. He reports that  drinks alcohol. He reports that he does not use illicit drugs. family history includes Alcohol abuse in his brother; Cancer in his brother and mother; Diabetes in his sister; and Stroke in his father. Allergies  Allergen Reactions  . Amoxicillin Itching and Rash  . Penicillins Itching and Rash   Current Outpatient Prescriptions on File Prior to Visit  Medication Sig Dispense Refill  .  aspirin 81 MG tablet Take 81 mg by mouth every morning.       Marland Kitchen azelastine (ASTELIN) 137 MCG/SPRAY nasal spray Place 1 spray into the nose 2 (two) times daily. Use in each nostril as directed      . clonazePAM (KLONOPIN) 0.5 MG tablet Take 1-2 tablets (0.5-1 mg total) by mouth at bedtime as needed. For sleep or anxiety  45 tablet  5  . donepezil (ARICEPT) 5 MG tablet Take 5 mg by mouth at bedtime.      . fluticasone (FLONASE) 50 MCG/ACT nasal spray Place 2 sprays into the nose daily.  16 g  6  . isosorbide mononitrate (IMDUR) 30 MG 24 hr tablet Take 0.5 tablets (15 mg total) by mouth every morning.  45 tablet  3  . loratadine (CLARITIN) 10 MG tablet Take 10 mg by mouth every morning.       . methocarbamol (ROBAXIN) 500 MG tablet Take 1 tablet (500 mg total) by mouth 3 (three) times daily.  10 tablet  0  . metoprolol succinate (TOPROL-XL) 25 MG 24 hr tablet Take 1 tablet (25 mg total) by mouth at bedtime.  90 tablet  3  . Multiple Vitamin (MULTIVITAMIN) tablet Take 1 tablet by mouth every morning.       Marland Kitchen  pantoprazole (PROTONIX) 40 MG tablet Take 1 tablet (40 mg total) by mouth every morning.  90 tablet  3  . rOPINIRole (REQUIP) 4 MG tablet Take 1 tablet (4 mg total) by mouth at bedtime.  60 tablet  5  . simvastatin (ZOCOR) 10 MG tablet TAKE 1 TABLET AT BEDTIME  90 tablet  3  . tamsulosin (FLOMAX) 0.4 MG CAPS Take 1 capsule (0.4 mg total) by mouth daily.  90 capsule  3  . traMADol (ULTRAM) 50 MG tablet Take 1 tablet (50 mg total) by mouth every 8 (eight) hours as needed for pain.  30 tablet  0  . warfarin (COUMADIN) 5 MG tablet Take 1 tablet (5 mg total) by mouth at bedtime. Take as directed by coumadin clinic.  90 tablet  1  . furosemide (LASIX) 40 MG tablet Take 0.5 tablets (20 mg total) by mouth daily.  90 tablet  3   No current facility-administered medications on file prior to visit.   Review of Systems  Constitutional: Negative for unexpected weight change, or unusual diaphoresis  HENT:  Negative for tinnitus.   Eyes: Negative for photophobia and visual disturbance.  Respiratory: Negative for choking and stridor.   Gastrointestinal: Negative for vomiting and blood in stool.  Genitourinary: Negative for hematuria and decreased urine volume.  Musculoskeletal: Negative for acute joint swelling Skin: Negative for color change and wound.  Neurological: Negative for tremors and numbness other than noted  Psychiatric/Behavioral: Negative for decreased concentration or  hyperactivity.       Objective:   Physical Exam BP 120/68  Pulse 72  Temp(Src) 98.2 F (36.8 C) (Oral)  Ht 5\' 9"  (1.753 m)  Wt 166 lb 8 oz (75.524 kg)  BMI 24.58 kg/m2  SpO2 97% VS noted,  Constitutional: Pt appears well-developed and well-nourished.  HENT: Head: NCAT.  Right Ear: External ear normal.  Left Ear: External ear normal.  Eyes: Conjunctivae and EOM are normal. Pupils are equal, round, and reactive to light.  Neck: Normal range of motion. Neck supple.  Cardiovascular: Normal rate and regular rhythm.   Pulmonary/Chest: Effort normal and breath sounds decreased with few bilat wheeze, no rales Neurological: Pt is alert. Not confused , motor intact Skin: Skin is warm. No erythema. Has trace LE edema to mid leg Psychiatric: Pt behavior is normal. Thought content normal. 1-2+ nervous    Assessment & Plan:

## 2012-12-24 NOTE — Telephone Encounter (Signed)
Patient Information:  Caller Name: Julieta Bellini  Phone: [phone]  Patient: Majel Homer  Gender: Male  DOB: Apr 28, 1926  Age: 79 Years  PCP: Oliver Barre (Adults only)  Office Follow Up:  Does the office need to follow up with this patient?: Yes  Instructions For The Office: Please follow up with patient regarding possible work in appointment.  He refuses to go to ED related to increased breathing.  No appointments with PCP within the next hour.   Mika requests home oxygen for patient.   Symptoms  Reason For Call & Symptoms: Breathing difficulty.  Worse 12/24/12. "Gasping if he walks 3 feet."  Go to ED Now per Breathing Difficulty protocol for Moderate difficulty breathing.  Patient asks to see Dr. Jonny Ruiz.  Reviewed Health History In EMR: Yes  Reviewed Medications In EMR: Yes  Reviewed Allergies In EMR: Yes  Reviewed Surgeries / Procedures: Yes  Date of Onset of Symptoms: 12/24/2012  Treatments Tried: Inhaler - helps  Treatments Tried Worked: No  Guideline(s) Used:  Breathing Difficulty  Disposition Per Guideline:   Go to ED Now  Reason For Disposition Reached:   Moderate difficulty breathing (e.g., speaks in phrases, SOB even at rest, pulse 100-120) of new onset or worse than normal  Advice Given:  General Care Advice for Breathing Difficulty:  Find position of greatest comfort. For most patients the best position is semi-upright (e.g., sitting up in a comfortable chair or lying back against pillows).  Avoid smoke or fume exposure.  Limit activities or space activities apart during the day. Prioritize activities.  Use a humidifier.  Call Back If:  Severe difficulty breathing occurs  You become worse.  Patient Refused Recommendation:  Patient Refused Care Advice  Patient wants to see PCP only.

## 2012-12-24 NOTE — Patient Instructions (Addendum)
You had the steroid shot today Please take all new medication as prescribed - the prednisone, and the Spiriva Please continue all other medications as before, including the dulera, and the ventolin inhaler Please go to the XRAY Department in the Basement (go straight as you get off the elevator) for the x-ray testing Please go to the LAB in the Basement (turn left off the elevator) for the tests to be done today Please keep your appointments with your specialists as you have planned - Oncology in October (this appt is already made) You will be contacted regarding the referral for: pulmonary for copd and the ? Asbestosis Please return in 6 months, or sooner if needed

## 2012-12-24 NOTE — Assessment & Plan Note (Signed)
stable overall by history and exam, recent data reviewed with pt, and pt to continue medical treatment as before,  to f/u any worsening symptoms or concerns BP Readings from Last 3 Encounters:  12/24/12 120/68  12/05/12 122/64  12/03/12 108/42

## 2012-12-24 NOTE — Assessment & Plan Note (Signed)
For cbc today, and f/u Dr Gaylyn Rong as planned

## 2012-12-24 NOTE — Assessment & Plan Note (Addendum)
Probable with noncompliance with meds recently, though cant r/o chf completely, for dulera re-start, cxr, Please continue all other medications as before, and refills have been done if requested. Also for trial spiriva  Note:  Total time for pt hx, exam, review of record with pt in the room, determination of diagnoses and plan for further eval and tx is > 40 min, with over 50% spent in coordination and counseling of patient

## 2012-12-24 NOTE — Telephone Encounter (Signed)
Patient scheduled with Dr. Jonny Ruiz today 12/24/12.

## 2012-12-24 NOTE — Telephone Encounter (Signed)
The patients daughter called to inform the patient is in need of home oxygen.  Stated that he went home from the Hospital with it from January, but kept only a few days.  Stated he canceled appt. With pulmonary MD in Jan.  The daughter states he now can hardly go to the bathroom without getting short of breath.  Please advise on either OV or home oxygen to be ordered.

## 2012-12-25 LAB — BASIC METABOLIC PANEL
BUN: 15 mg/dL (ref 6–23)
Chloride: 106 mEq/L (ref 96–112)
Potassium: 3.8 mEq/L (ref 3.5–5.1)

## 2012-12-25 LAB — HEPATIC FUNCTION PANEL
ALT: 30 U/L (ref 0–53)
AST: 36 U/L (ref 0–37)
Alkaline Phosphatase: 76 U/L (ref 39–117)
Bilirubin, Direct: 0.2 mg/dL (ref 0.0–0.3)
Total Bilirubin: 0.9 mg/dL (ref 0.3–1.2)

## 2012-12-25 NOTE — Telephone Encounter (Signed)
Called informed the patients daughter of MD instructions on medication.s

## 2012-12-26 ENCOUNTER — Telehealth: Payer: Self-pay

## 2012-12-26 NOTE — Telephone Encounter (Signed)
Prior authorization form for Proventil HFA filled out and waiting for Dr Jonny Ruiz sig.

## 2013-01-01 ENCOUNTER — Encounter: Payer: Self-pay | Admitting: Internal Medicine

## 2013-01-01 ENCOUNTER — Ambulatory Visit (INDEPENDENT_AMBULATORY_CARE_PROVIDER_SITE_OTHER): Payer: Medicare Other | Admitting: Internal Medicine

## 2013-01-01 ENCOUNTER — Other Ambulatory Visit (INDEPENDENT_AMBULATORY_CARE_PROVIDER_SITE_OTHER): Payer: Medicare Other

## 2013-01-01 VITALS — BP 98/62 | HR 76 | Temp 97.0°F | Ht 69.0 in | Wt 163.4 lb

## 2013-01-01 DIAGNOSIS — M25512 Pain in left shoulder: Secondary | ICD-10-CM

## 2013-01-01 DIAGNOSIS — M25519 Pain in unspecified shoulder: Secondary | ICD-10-CM

## 2013-01-01 DIAGNOSIS — Z5181 Encounter for therapeutic drug level monitoring: Secondary | ICD-10-CM

## 2013-01-01 DIAGNOSIS — I4891 Unspecified atrial fibrillation: Secondary | ICD-10-CM

## 2013-01-01 DIAGNOSIS — J441 Chronic obstructive pulmonary disease with (acute) exacerbation: Secondary | ICD-10-CM

## 2013-01-01 DIAGNOSIS — I509 Heart failure, unspecified: Secondary | ICD-10-CM

## 2013-01-01 DIAGNOSIS — I4821 Permanent atrial fibrillation: Secondary | ICD-10-CM

## 2013-01-01 DIAGNOSIS — Z7901 Long term (current) use of anticoagulants: Secondary | ICD-10-CM | POA: Insufficient documentation

## 2013-01-01 HISTORY — DX: Heart failure, unspecified: I50.9

## 2013-01-01 LAB — BASIC METABOLIC PANEL
CO2: 33 mEq/L — ABNORMAL HIGH (ref 19–32)
Calcium: 9 mg/dL (ref 8.4–10.5)
Creatinine, Ser: 1.2 mg/dL (ref 0.4–1.5)
GFR: 64.2 mL/min (ref >60.00)
Potassium: 4.3 mEq/L (ref 3.5–5.1)
Sodium: 140 mEq/L (ref 135–145)

## 2013-01-01 MED ORDER — OXYCODONE HCL 5 MG PO TABS: 5.0000 mg | ORAL_TABLET | Freq: Three times a day (TID) | ORAL | Status: DC | PRN

## 2013-01-01 NOTE — Assessment & Plan Note (Signed)
Unclear exact etiology - for ortho referral, oxycodone prn (has tolerated in past)

## 2013-01-01 NOTE — Patient Instructions (Addendum)
Please continue all other medications as before, including HALF of the new fluid pill as you have been doing Please take all new medication as prescribed - the pain medication You will be contacted regarding the referral for: orthopedic for the left shoulder Please keep your appointments with your specialists as you have planned - cardiology and other Please go to the LAB in the Basement (turn left off the elevator) for the tests to be done today You will be contacted by phone if any changes need to be made immediately.  Otherwise, you will receive a letter about your results with an explanation, but please check with MyChart first. Thank you for enrolling in MyChart. Please follow the instructions below to securely access your online medical record. MyChart allows you to send messages to your doctor, view your test results, renew your prescriptions, schedule appointments, and more. Please return in 6 months, or sooner if needed

## 2013-01-01 NOTE — Assessment & Plan Note (Signed)
stable overall by history and exam, volume stable, and pt to continue medical treatment as before,  to f/u any worsening symptoms or concerns  

## 2013-01-01 NOTE — Progress Notes (Signed)
Subjective:    Patient ID: [name] [name], male    DOB: 13-Jul-1926, 79 y.o.   MRN: [id number]  HPI  Here to f/u with grandson who now lives with him but occas has to leave on business overnight, pt last wk tx for copd exac, CHF now back to baseline with antibx/predpack and start new lasix now at 20 qd, wt down to 163, denies orthostasis, quite pleased with his improvement. Pt denies chest pain, increased sob or doe, wheezing, orthopnea, PND, increased LE swelling, palpitations, dizziness or syncope. Pt denies new neurological symptoms such as new headache, or facial or extremity weakness or numbness.  Pt denies polydipsia, polyuria.  Does have significant left shoulder pain worse acute on chronic after a fall 4 wks ago, after an initial accident 4 yrs ago with chronic dislocation apparently not able to be reduced after seeing ortho.  Has worse ROM and ongoing pain, did have recent films at [facility] without fx, but asks for ortho eval.  No neck or radicular pain. Pt denies falls but has bruise to left arm occurred when grandson was gone overnight 2 days ago. Past Medical History  Diagnosis Date  . CAD (coronary artery disease)   . Hypertension   . Vitamin D deficiency   . Chronic back pain   . Anemia   . ED (erectile dysfunction)   . Arthritis   . RLS (restless legs syndrome)   . GERD (gastroesophageal reflux disease)   . COPD (chronic obstructive pulmonary disease)   . Hyperlipidemia   . Atrial fibrillation   . Second degree Mobitz II AV block     s/p PPM  . Asthma 09/15/2011  . Long term current use of anticoagulant 09/15/2011  . Thrombocytopenia 09/15/2011  . Lymphocytosis    Past Surgical History  Procedure Laterality Date  . Coronary artery bypass graft    . Cataract extraction    . Pacemaker insertion      reports that he has quit smoking. He has never used smokeless tobacco. He reports that  drinks alcohol. He reports that he does not use illicit drugs. family history includes Alcohol  abuse in his brother; Cancer in his brother and mother; Diabetes in his sister; and Stroke in his father. Allergies  Allergen Reactions  . Amoxicillin Itching and Rash  . Penicillins Itching and Rash   Current Outpatient Prescriptions on File Prior to Visit  Medication Sig Dispense Refill  . albuterol (PROVENTIL HFA;VENTOLIN HFA) 108 (90 BASE) MCG/ACT inhaler Inhale 2 puffs into the lungs every 6 (six) hours as needed. For wheezing or shortness of breath  1 Inhaler  11  . aspirin 81 MG tablet Take 81 mg by mouth every morning.       Marland Kitchen azelastine (ASTELIN) 137 MCG/SPRAY nasal spray Place 1 spray into the nose 2 (two) times daily. Use in each nostril as directed      . clonazePAM (KLONOPIN) 0.5 MG tablet Take 1-2 tablets (0.5-1 mg total) by mouth at bedtime as needed. For sleep or anxiety  45 tablet  5  . donepezil (ARICEPT) 5 MG tablet Take 5 mg by mouth at bedtime.      . fluticasone (FLONASE) 50 MCG/ACT nasal spray Place 2 sprays into the nose daily.  16 g  6  . furosemide (LASIX) 40 MG tablet Take 0.5 tablets (20 mg total) by mouth daily.  90 tablet  3  . isosorbide mononitrate (IMDUR) 30 MG 24 hr tablet Take 0.5 tablets (15 mg total)  by mouth every morning.  45 tablet  3  . loratadine (CLARITIN) 10 MG tablet Take 10 mg by mouth every morning.       . methocarbamol (ROBAXIN) 500 MG tablet Take 1 tablet (500 mg total) by mouth 3 (three) times daily.  10 tablet  0  . metoprolol succinate (TOPROL-XL) 25 MG 24 hr tablet Take 1 tablet (25 mg total) by mouth at bedtime.  90 tablet  3  . mometasone-formoterol (DULERA) 200-5 MCG/ACT AERO Inhale 2 puffs into the lungs 2 (two) times daily.  13 g  11  . Multiple Vitamin (MULTIVITAMIN) tablet Take 1 tablet by mouth every morning.       . pantoprazole (PROTONIX) 40 MG tablet Take 1 tablet (40 mg total) by mouth every morning.  90 tablet  3  . rOPINIRole (REQUIP) 4 MG tablet Take 1 tablet (4 mg total) by mouth at bedtime.  60 tablet  5  . simvastatin  (ZOCOR) 10 MG tablet TAKE 1 TABLET AT BEDTIME  90 tablet  3  . tamsulosin (FLOMAX) 0.4 MG CAPS Take 1 capsule (0.4 mg total) by mouth daily.  90 capsule  3  . tiotropium (SPIRIVA HANDIHALER) 18 MCG inhalation capsule Place 1 capsule (18 mcg total) into inhaler and inhale daily.  30 capsule  12  . warfarin (COUMADIN) 5 MG tablet Take 1 tablet (5 mg total) by mouth at bedtime. Take as directed by coumadin clinic.  90 tablet  1   Current Facility-Administered Medications on File Prior to Visit  Medication Dose Route Frequency Provider Last Rate Last Dose  . methylPREDNISolone acetate (DEPO-MEDROL) injection 80 mg  80 mg Intramuscular Once Corwin Levins, MD        Review of Systems  Constitutional: Negative for unexpected weight change, or unusual diaphoresis  HENT: Negative for tinnitus.   Eyes: Negative for photophobia and visual disturbance.  Respiratory: Negative for choking and stridor.   Gastrointestinal: Negative for vomiting and blood in stool.  Genitourinary: Negative for hematuria and decreased urine volume.  Musculoskeletal: Negative for acute joint swelling Skin: Negative for color change and wound.  Neurological: Negative for tremors and numbness other than noted  Psychiatric/Behavioral: Negative for decreased concentration or  hyperactivity.       Objective:   Physical Exam BP 98/62  Pulse 76  Temp(Src) 97 F (36.1 C) (Oral)  Ht 5\' 9"  (1.753 m)  Wt 163 lb 6 oz (74.106 kg)  BMI 24.12 kg/m2  SpO2 95% VS noted, not ill, much more vigorous appearing, stands quickly and gets up on exam table without assist or sob Constitutional: Pt appears well-developed and well-nourished.  HENT: Head: NCAT.  Right Ear: External ear normal.  Left Ear: External ear normal.  Eyes: Conjunctivae and EOM are normal. Pupils are equal, round, and reactive to light.  Neck: Normal range of motion. Neck supple.  Cardiovascular: Normal rate and irregular rhythm.   Pulmonary/Chest: Effort normal and  breath sounds mild decreased, no rales or wheezing.  Neurological: Pt is alert. Not confused  Skin: Skin is warm. No erythema.  No LE edema, does have a bruise to left arm Psychiatric: Pt behavior is normal. Thought content normal.  Left shoulder with apparently chronic anterior dislocation, post mild tenderness, reduced ROM    Assessment & Plan:

## 2013-01-01 NOTE — Assessment & Plan Note (Signed)
Resolved clinically, no further tx needed SpO2 Readings from Last 3 Encounters:  01/01/13 95%  12/24/12 97%  12/05/12 95%

## 2013-01-01 NOTE — Assessment & Plan Note (Signed)
Appears vigorous today, has a significanat bruise to left arm today, denies falling but grandson concerned about ? Worsening unsteadiness, would cont to monitor but if falls would need to consider stopping coumadin

## 2013-01-01 NOTE — Assessment & Plan Note (Signed)
With recent pulm edema in the setting of copd exac, afib - now improved, cont lasix 20 qd and current meds,  to f/u any worsening symptoms or concerns, for card f/u as planned

## 2013-01-03 ENCOUNTER — Other Ambulatory Visit: Payer: Self-pay | Admitting: Internal Medicine

## 2013-01-03 ENCOUNTER — Telehealth: Payer: Self-pay

## 2013-01-03 DIAGNOSIS — M25512 Pain in left shoulder: Secondary | ICD-10-CM

## 2013-01-03 MED ORDER — HYDROCODONE-ACETAMINOPHEN 5-325 MG PO TABS: 1.0000 | ORAL_TABLET | Freq: Four times a day (QID) | ORAL | Status: DC | PRN

## 2013-01-03 MED ORDER — TRAMADOL HCL 50 MG PO TABS: 50.0000 mg | ORAL_TABLET | Freq: Three times a day (TID) | ORAL | Status: DC | PRN

## 2013-01-03 NOTE — Telephone Encounter (Signed)
Per grandson, pt had previously tried Tramadol and it did not work. Grandson states that at [facility] said that if medication was too strong he would change to hydrocodone, please advise

## 2013-01-03 NOTE — Telephone Encounter (Signed)
Try tramadol 

## 2013-01-03 NOTE — Telephone Encounter (Signed)
Pt's grandson called stating that Oxycodone prescribed at last OV is too strong for pt, pt becomes disoriented and confused when he takes it. Lucila Maine is requesting a milder pain medication like hydrocodone, please advise in Dr Raphael Gibney absence, thanks!

## 2013-01-04 NOTE — Telephone Encounter (Signed)
Pt's spouse pick up new Rx for Hydrocodone per office manager Vicie Mutters.

## 2013-01-10 ENCOUNTER — Ambulatory Visit (INDEPENDENT_AMBULATORY_CARE_PROVIDER_SITE_OTHER): Payer: Medicare Other | Admitting: Emergency Medicine

## 2013-01-10 ENCOUNTER — Encounter: Payer: Self-pay | Admitting: Emergency Medicine

## 2013-01-10 VITALS — BP 98/50 | HR 70 | Ht 69.0 in | Wt 150.4 lb

## 2013-01-10 DIAGNOSIS — J449 Chronic obstructive pulmonary disease, unspecified: Secondary | ICD-10-CM

## 2013-01-10 DIAGNOSIS — R9389 Abnormal findings on diagnostic imaging of other specified body structures: Secondary | ICD-10-CM

## 2013-01-10 NOTE — Patient Instructions (Addendum)
Please take these breathing medications:  - Spiriva one inhalation daily - Dulera 2 puffs twice a day - albuterol 2 puffs as needed for shortness of breath Stop the Advair if you have been taking this  We will repeat your CT scan of your chest in August 2014. Please schedule this test today Follow with Dr Delton Coombes in 1 month so we can check your breathing

## 2013-01-10 NOTE — Assessment & Plan Note (Signed)
Stable at this time following recent exacerbation. He dis not desat on walk today. He does not understand his meds - they don't match our med list.  - spiriva + dulera + saba prn - rov 1 month and frequently - may need to recheck walking oximetry

## 2013-01-10 NOTE — Assessment & Plan Note (Signed)
With progressive B LL infiltrate and scar dating back several years. This has progressed on most recent scan, prompting concern for malignancy. This could be the case given his wt loss (although he has other chronic dz that could result in wt loss) , but would also suspect asbestosis and scarring. He has refused pulm appointment in the past, would likely refuse bx.  - at this point will repeat the scan after 6 months. Not clear that he would allow a bx, but we could revisit it at that time if he is changing.

## 2013-01-10 NOTE — Progress Notes (Signed)
Subjective:     Patient ID: [name] [name], male   DOB: 08/11/1926, 79 y.o.   MRN: [id number]  HPI 79 yo man, active drinker, former smoker (> 100 pk-yrs), hx of COPD, CAD, HTN, Mobitz II s/p pacer, A fib, CHF, RLS. Referred by Dr Jonny Ruiz for COPD, recent exacerbations and addition of spiriva to his regimen (to [place]). He tells me today that he thinks he is also taking Advair. Also for abnormal CT scan of the chest with L > RLL infiltrate/scar that has progressed on CT scan from 10/24/12, now also assoc w small effusion.  He has significant asbestos exposure from his past work. Apparently he was referred to [place] by Dr Gaylyn Rong to evaluate the CT scan, but he didn't want to come and didn't want chemo if it was a malignancy.   He is confused today and his grandson's girlfriend today tells me that he has been forgetful, more confused.  He tells me that his breathing is doing better right now, benefited from depo-medrol given by Dr Jonny Ruiz 4/29. He seldom wheezes, has occas cough. He denies hospitalization for COPD. He has been given O2 but he wouldn't wear it so he sent it back.    Review of Systems  Constitutional: Positive for unexpected weight change. Negative for fever.  HENT: Negative for ear pain, nosebleeds, congestion, sore throat, rhinorrhea, sneezing, trouble swallowing, dental problem, postnasal drip and sinus pressure.   Eyes: Negative for redness and itching.  Respiratory: Positive for shortness of breath. Negative for cough, chest tightness and wheezing.   Cardiovascular: Negative for palpitations and leg swelling.  Gastrointestinal: Negative for nausea and vomiting.  Genitourinary: Negative for dysuria.  Musculoskeletal: Negative for joint swelling.  Skin: Negative for rash.  Neurological: Negative for headaches.  Hematological: Does not bruise/bleed easily.  Psychiatric/Behavioral: Negative for dysphoric mood. The patient is not nervous/anxious.    Past Medical History  Diagnosis Date  . CAD  (coronary artery disease)   . Hypertension   . Vitamin D deficiency   . Chronic back pain   . Anemia   . ED (erectile dysfunction)   . Arthritis   . RLS (restless legs syndrome)   . GERD (gastroesophageal reflux disease)   . COPD (chronic obstructive pulmonary disease)   . Hyperlipidemia   . Atrial fibrillation   . Second degree Mobitz II AV block     s/p PPM  . Asthma 09/15/2011  . Long term current use of anticoagulant 09/15/2011  . Thrombocytopenia 09/15/2011  . Lymphocytosis   . CHF (congestive heart failure) 01/01/2013     Family History  Problem Relation Age of Onset  . Diabetes Sister   . Cancer Mother     lung cancer  . Stroke Father   . Cancer Brother     brain cancer  . Alcohol abuse Brother      History   Social History  . Marital Status: Widowed    Spouse Name: N/A    Number of Children: 2  . Years of Education: N/A   Occupational History  .      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    Social History Main Topics  . Smoking status: Former Smoker -- 3.00 packs/day for 80 years    Types: Cigarettes    Quit date: 09/05/2008  . Smokeless tobacco: Never Used  . Alcohol Use: Yes     Comment: occasional 4 drinks per week  . Drug Use: No  .  Sexually Active: Not Currently   Other Topics Concern  . Not on file   Social History Narrative  . No narrative on file     Allergies  Allergen Reactions  . Amoxicillin Itching and Rash  . Penicillins Itching and Rash     Outpatient Prescriptions Prior to Visit  Medication Sig Dispense Refill  . albuterol (PROVENTIL HFA;VENTOLIN HFA) 108 (90 BASE) MCG/ACT inhaler Inhale 2 puffs into the lungs every 6 (six) hours as needed. For wheezing or shortness of breath  1 Inhaler  11  . aspirin 81 MG tablet Take 81 mg by mouth every morning.       Marland Kitchen azelastine (ASTELIN) 137 MCG/SPRAY nasal spray Place 1 spray into the nose 2 (two) times daily. Use in each nostril as directed      .  clonazePAM (KLONOPIN) 0.5 MG tablet Take 1-2 tablets (0.5-1 mg total) by mouth at bedtime as needed. For sleep or anxiety  45 tablet  5  . donepezil (ARICEPT) 5 MG tablet Take 5 mg by mouth at bedtime.      . fluticasone (FLONASE) 50 MCG/ACT nasal spray Place 2 sprays into the nose daily.  16 g  6  . furosemide (LASIX) 40 MG tablet Take 0.5 tablets (20 mg total) by mouth daily.  90 tablet  3  . HYDROcodone-acetaminophen (NORCO/VICODIN) 5-325 MG per tablet Take 1 tablet by mouth every 6 (six) hours as needed for pain.  50 tablet  0  . isosorbide mononitrate (IMDUR) 30 MG 24 hr tablet Take 0.5 tablets (15 mg total) by mouth every morning.  45 tablet  3  . loratadine (CLARITIN) 10 MG tablet Take 10 mg by mouth every morning.       . methocarbamol (ROBAXIN) 500 MG tablet Take 1 tablet (500 mg total) by mouth 3 (three) times daily.  10 tablet  0  . metoprolol succinate (TOPROL-XL) 25 MG 24 hr tablet Take 1 tablet (25 mg total) by mouth at bedtime.  90 tablet  3  . mometasone-formoterol (DULERA) 200-5 MCG/ACT AERO Inhale 2 puffs into the lungs 2 (two) times daily.  13 g  11  . Multiple Vitamin (MULTIVITAMIN) tablet Take 1 tablet by mouth every morning.       . pantoprazole (PROTONIX) 40 MG tablet Take 1 tablet (40 mg total) by mouth every morning.  90 tablet  3  . rOPINIRole (REQUIP) 4 MG tablet Take 1 tablet (4 mg total) by mouth at bedtime.  60 tablet  5  . simvastatin (ZOCOR) 10 MG tablet TAKE 1 TABLET AT BEDTIME  90 tablet  3  . tamsulosin (FLOMAX) 0.4 MG CAPS Take 1 capsule (0.4 mg total) by mouth daily.  90 capsule  3  . tiotropium (SPIRIVA HANDIHALER) 18 MCG inhalation capsule Place 1 capsule (18 mcg total) into inhaler and inhale daily.  30 capsule  12  . warfarin (COUMADIN) 5 MG tablet Take 1 tablet (5 mg total) by mouth at bedtime. Take as directed by coumadin clinic.  90 tablet  1   Facility-Administered Medications Prior to Visit  Medication Dose Route Frequency Provider Last Rate Last Dose   . methylPREDNISolone acetate (DEPO-MEDROL) injection 80 mg  80 mg Intramuscular Once Corwin Levins, MD             Objective:   Physical Exam Filed Vitals:   01/10/13 1558  BP: 98/50  Pulse: 70  Height: 5\' 9"  (1.753 m)  Weight: 150 lb 6.4 oz (68.221 kg)  SpO2: 98%   Gen: Pleasant, elderly man, in no distress, confused  ENT: No lesions,  mouth clear,  oropharynx clear, no postnasal drip  Neck: No JVD, no TMG, no carotid bruits  Lungs: No use of accessory muscles,  No crackles, mild B insp rhonchi, no wheezes  Cardiovascular: RRR, heart sounds normal, no murmur or gallops, no peripheral edema  Musculoskeletal: No deformities, no cyanosis or clubbing  Neuro: alert, non focal  Skin: Warm, no lesions or rashes, scattered bruising      Assessment:     COPD Stable at this time following recent exacerbation. He dis not desat on walk today. He does not understand his meds - they don't match our med list.  - spiriva + dulera + saba prn - rov 1 month and frequently - may need to recheck walking oximetry   Abnormal CT scan, chest With progressive B LL infiltrate and scar dating back several years. This has progressed on most recent scan, prompting concern for malignancy. This could be the case given his wt loss (although he has other chronic dz that could result in wt loss) , but would also suspect asbestosis and scarring. He has refused pulm appointment in the past, would likely refuse bx.  - at this point will repeat the scan after 6 months. Not clear that he would allow a bx, but we could revisit it at that time if he is changing.

## 2013-01-15 ENCOUNTER — Other Ambulatory Visit: Payer: Self-pay | Admitting: Internal Medicine

## 2013-01-15 ENCOUNTER — Ambulatory Visit: Payer: Medicare Other | Admitting: Neurosurgery

## 2013-01-16 ENCOUNTER — Other Ambulatory Visit: Payer: Self-pay | Admitting: Internal Medicine

## 2013-01-16 NOTE — Telephone Encounter (Signed)
Done hardcopy to robin  

## 2013-01-17 ENCOUNTER — Other Ambulatory Visit: Payer: Self-pay

## 2013-01-17 NOTE — Telephone Encounter (Signed)
Opened in error hydrocodone already called to pharmacy

## 2013-01-17 NOTE — Telephone Encounter (Signed)
Hydrocodone has been called to pharmacy

## 2013-01-22 ENCOUNTER — Ambulatory Visit: Payer: Medicare Other

## 2013-01-25 ENCOUNTER — Ambulatory Visit (INDEPENDENT_AMBULATORY_CARE_PROVIDER_SITE_OTHER): Payer: Medicare Other | Admitting: General Practice

## 2013-01-25 DIAGNOSIS — I4891 Unspecified atrial fibrillation: Secondary | ICD-10-CM

## 2013-01-25 LAB — POCT INR: INR: 1.2

## 2013-01-29 ENCOUNTER — Ambulatory Visit (INDEPENDENT_AMBULATORY_CARE_PROVIDER_SITE_OTHER): Payer: Medicare Other | Admitting: *Deleted

## 2013-01-29 ENCOUNTER — Encounter: Payer: Self-pay | Admitting: Internal Medicine

## 2013-01-29 DIAGNOSIS — I441 Atrioventricular block, second degree: Secondary | ICD-10-CM

## 2013-01-29 DIAGNOSIS — Z950 Presence of cardiac pacemaker: Secondary | ICD-10-CM

## 2013-01-29 LAB — REMOTE PACEMAKER DEVICE
ATRIAL PACING PM: 1
BAMS-0003: 70 {beats}/min
BRDY-0002RV: 60 {beats}/min
BRDY-0003RV: 120 {beats}/min
DEVICE MODEL PM: 2326107
RV LEAD AMPLITUDE: 6.7 mv
RV LEAD IMPEDENCE PM: 410 Ohm

## 2013-01-30 ENCOUNTER — Ambulatory Visit (INDEPENDENT_AMBULATORY_CARE_PROVIDER_SITE_OTHER): Payer: Medicare Other | Admitting: Family Medicine

## 2013-01-30 DIAGNOSIS — I4891 Unspecified atrial fibrillation: Secondary | ICD-10-CM

## 2013-01-30 LAB — POCT INR: INR: 1.6

## 2013-02-07 ENCOUNTER — Other Ambulatory Visit (HOSPITAL_BASED_OUTPATIENT_CLINIC_OR_DEPARTMENT_OTHER): Payer: Medicare Other | Admitting: Lab

## 2013-02-07 ENCOUNTER — Other Ambulatory Visit: Payer: Self-pay | Admitting: Oncology

## 2013-02-07 ENCOUNTER — Telehealth: Payer: Self-pay

## 2013-02-07 ENCOUNTER — Encounter: Payer: Self-pay | Admitting: Oncology

## 2013-02-07 DIAGNOSIS — D72820 Lymphocytosis (symptomatic): Secondary | ICD-10-CM

## 2013-02-07 LAB — CBC WITH DIFFERENTIAL/PLATELET
Basophils Absolute: 0 10*3/uL (ref 0.0–0.1)
EOS%: 0.5 % (ref 0.0–7.0)
Eosinophils Absolute: 0.1 10*3/uL (ref 0.0–0.5)
HGB: 13 g/dL (ref 13.0–17.1)
NEUT#: 3.6 10*3/uL (ref 1.5–6.5)
RDW: 15.1 % — ABNORMAL HIGH (ref 11.0–14.6)
WBC: 12.6 10*3/uL — ABNORMAL HIGH (ref 4.0–10.3)
lymph#: 8.3 10*3/uL — ABNORMAL HIGH (ref 0.9–3.3)

## 2013-02-07 NOTE — Telephone Encounter (Signed)
Called the grandson Oswaldo Done) informed letter is ready for pickup at the front desk.

## 2013-02-07 NOTE — Telephone Encounter (Signed)
Ok - to robin to help with letter

## 2013-02-07 NOTE — Telephone Encounter (Signed)
Toya Smothers (Daughter In law) called requesting a letter stating the patients dementia is getting worse and he is unable to make decisions.  Please advise call back number is [phone] or [phone]

## 2013-02-07 NOTE — Progress Notes (Signed)
I sent the following note both via MyChart and regular mail on 02/07/13:  Dear Mr. Hosek:  Your white blood cell counts are still elevated.  Your previous CT scan in 10/2012 showed some enlarged lymph nodes.  This is most likely due to a chronic, slow growing lymphoma. These types of lymphoma do not always need chemo right away since they are often slow growing.   If you are doing well without severe fatigue, weight loss, recurrent infection, I would continue to observe.  However, if you have any of these symptoms or any concern, we may consider diagnostic bone marrow biopsy.   Please let us know if you have any question.  Thank you.  Dr. Gaylyn Rong.

## 2013-02-08 ENCOUNTER — Encounter: Payer: Self-pay | Admitting: *Deleted

## 2013-02-08 ENCOUNTER — Telehealth: Payer: Self-pay

## 2013-02-08 NOTE — Telephone Encounter (Signed)
Letter corrected and to be picked up at the front desk.

## 2013-02-11 ENCOUNTER — Telehealth: Payer: Self-pay | Admitting: Internal Medicine

## 2013-02-11 MED ORDER — HYDROCODONE-ACETAMINOPHEN 5-325 MG PO TABS: ORAL_TABLET | ORAL | Status: DC

## 2013-02-11 NOTE — Telephone Encounter (Signed)
Pt is requesting dose of pain meds to be upped. Is almost out of meds.

## 2013-02-11 NOTE — Telephone Encounter (Signed)
Script faxed to pharmacy  and family notified as requested.

## 2013-02-11 NOTE — Telephone Encounter (Signed)
Daughter has called back requesting refill done by the end of the day.  Informed MD has been notified and hardcopy can be faxed to [facility] once faxed.  Call the patient at [phone] to inform was rx completed

## 2013-02-11 NOTE — Telephone Encounter (Signed)
Done hardcopy to robin  

## 2013-02-11 NOTE — Telephone Encounter (Signed)
Please advise on refill of the patients hydrocodone or increase the dose please due to increase in pain please advise.  Pharmacy [facility] call back number is [phone]

## 2013-02-11 NOTE — Telephone Encounter (Signed)
Pt called to check up on pain med req.

## 2013-02-17 ENCOUNTER — Other Ambulatory Visit: Payer: Self-pay | Admitting: Internal Medicine

## 2013-02-20 ENCOUNTER — Telehealth: Payer: Self-pay | Admitting: General Practice

## 2013-02-20 ENCOUNTER — Ambulatory Visit (INDEPENDENT_AMBULATORY_CARE_PROVIDER_SITE_OTHER): Payer: Medicare Other | Admitting: General Practice

## 2013-02-20 DIAGNOSIS — I4891 Unspecified atrial fibrillation: Secondary | ICD-10-CM

## 2013-02-20 NOTE — Telephone Encounter (Signed)
Spoke with patient's daughter to let her know that I saw patient today in coumadin clinic and as far as I can tell (INR 1.2) pt is not taking coumadin.  I also spoke with patient's grandson Milus Banister who lives with patient to notify him of patient's dosing instructions.  Daughter thanked me for calling her and said that patient has been on a rapid decline since March.  Dtr said that she would be in contact with patient.

## 2013-03-01 ENCOUNTER — Ambulatory Visit (INDEPENDENT_AMBULATORY_CARE_PROVIDER_SITE_OTHER): Payer: Medicare Other | Admitting: General Practice

## 2013-03-01 DIAGNOSIS — I4891 Unspecified atrial fibrillation: Secondary | ICD-10-CM

## 2013-03-22 ENCOUNTER — Ambulatory Visit (INDEPENDENT_AMBULATORY_CARE_PROVIDER_SITE_OTHER): Payer: Medicare Other | Admitting: General Practice

## 2013-03-22 DIAGNOSIS — I4891 Unspecified atrial fibrillation: Secondary | ICD-10-CM

## 2013-03-30 ENCOUNTER — Other Ambulatory Visit: Payer: Self-pay | Admitting: Internal Medicine

## 2013-04-08 ENCOUNTER — Ambulatory Visit (INDEPENDENT_AMBULATORY_CARE_PROVIDER_SITE_OTHER): Payer: Medicare Other | Admitting: Internal Medicine

## 2013-04-08 ENCOUNTER — Encounter: Payer: Self-pay | Admitting: Internal Medicine

## 2013-04-08 VITALS — BP 128/62 | HR 86 | Temp 97.0°F | Wt 150.0 lb

## 2013-04-08 DIAGNOSIS — M25519 Pain in unspecified shoulder: Secondary | ICD-10-CM

## 2013-04-08 DIAGNOSIS — M25512 Pain in left shoulder: Secondary | ICD-10-CM

## 2013-04-08 MED ORDER — TRAMADOL HCL 50 MG PO TABS: ORAL_TABLET | ORAL | Status: DC

## 2013-04-08 MED ORDER — OXYCODONE HCL 5 MG PO TABA: 1.0000 | ORAL_TABLET | Freq: Three times a day (TID) | ORAL | Status: DC | PRN

## 2013-04-08 NOTE — Addendum Note (Signed)
Addended by: Lorre Munroe on: 04/08/2013 02:17 PM   Modules accepted: Orders

## 2013-04-08 NOTE — Patient Instructions (Signed)
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
## 2013-04-15 ENCOUNTER — Other Ambulatory Visit: Payer: Self-pay | Admitting: Internal Medicine

## 2013-04-15 ENCOUNTER — Other Ambulatory Visit: Payer: Medicare Other

## 2013-04-15 NOTE — Telephone Encounter (Signed)
Faxed hardcopy to [facility]

## 2013-04-15 NOTE — Telephone Encounter (Signed)
Done hardcopy to robin  

## 2013-04-21 ENCOUNTER — Other Ambulatory Visit: Payer: Self-pay | Admitting: Internal Medicine

## 2013-04-25 ENCOUNTER — Other Ambulatory Visit: Payer: Self-pay | Admitting: *Deleted

## 2013-04-25 MED ORDER — TRAMADOL HCL 50 MG PO TABS: ORAL_TABLET | ORAL | Status: DC

## 2013-04-26 ENCOUNTER — Encounter (INDEPENDENT_AMBULATORY_CARE_PROVIDER_SITE_OTHER): Payer: Medicare Other

## 2013-04-26 DIAGNOSIS — I4891 Unspecified atrial fibrillation: Secondary | ICD-10-CM

## 2013-05-03 ENCOUNTER — Encounter: Payer: Self-pay | Admitting: Internal Medicine

## 2013-05-03 ENCOUNTER — Ambulatory Visit (INDEPENDENT_AMBULATORY_CARE_PROVIDER_SITE_OTHER): Payer: Medicare Other | Admitting: General Practice

## 2013-05-03 ENCOUNTER — Ambulatory Visit (INDEPENDENT_AMBULATORY_CARE_PROVIDER_SITE_OTHER): Payer: Medicare Other | Admitting: Internal Medicine

## 2013-05-03 VITALS — BP 98/60 | HR 109 | Temp 97.3°F | Ht 69.0 in | Wt 151.2 lb

## 2013-05-03 DIAGNOSIS — I10 Essential (primary) hypertension: Secondary | ICD-10-CM

## 2013-05-03 DIAGNOSIS — M25519 Pain in unspecified shoulder: Secondary | ICD-10-CM

## 2013-05-03 DIAGNOSIS — M25511 Pain in right shoulder: Secondary | ICD-10-CM | POA: Insufficient documentation

## 2013-05-03 DIAGNOSIS — I4891 Unspecified atrial fibrillation: Secondary | ICD-10-CM

## 2013-05-03 DIAGNOSIS — G8929 Other chronic pain: Secondary | ICD-10-CM

## 2013-05-03 LAB — POCT INR: INR: 3

## 2013-05-03 MED ORDER — OXYCODONE-ACETAMINOPHEN 7.5-325 MG PO TABS: 1.0000 | ORAL_TABLET | Freq: Four times a day (QID) | ORAL | Status: DC | PRN
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
## 2013-05-03 NOTE — Patient Instructions (Signed)
Please stop the tramadol and any hydrocodone you may have at home  Please take all new medication as prescribed - the percocet 7.5/325 as prescribed  You will be contacted regarding the referral for: Orthopedic

## 2013-05-05 NOTE — Assessment & Plan Note (Signed)
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

## 2013-05-05 NOTE — Assessment & Plan Note (Signed)
Less likely amenable to tx, but also for ortho re-eval

## 2013-05-05 NOTE — Assessment & Plan Note (Signed)
stable overall by history and exam, recent data reviewed with pt, and pt to continue medical treatment as before,  to f/u any worsening symptoms or concerns BP Readings from Last 3 Encounters:  05/03/13 98/60  04/08/13 128/62  01/10/13 98/50

## 2013-05-07 ENCOUNTER — Ambulatory Visit (INDEPENDENT_AMBULATORY_CARE_PROVIDER_SITE_OTHER): Payer: Medicare Other | Admitting: *Deleted

## 2013-05-07 DIAGNOSIS — I441 Atrioventricular block, second degree: Secondary | ICD-10-CM

## 2013-05-07 DIAGNOSIS — Z950 Presence of cardiac pacemaker: Secondary | ICD-10-CM

## 2013-05-08 LAB — REMOTE PACEMAKER DEVICE
ATRIAL PACING PM: 1
BAMS-0003: 70 {beats}/min
BRDY-0002RV: 60 {beats}/min
BRDY-0003RV: 120 {beats}/min
BRDY-0004RV: 120 {beats}/min
DEVICE MODEL PM: 2326107
RV LEAD IMPEDENCE PM: 430 Ohm
VENTRICULAR PACING PM: 90

## 2013-05-15 ENCOUNTER — Encounter: Payer: Self-pay | Admitting: *Deleted

## 2013-05-20 NOTE — Progress Notes (Signed)
ppm remote check. All functions normal (2 NSVT previously noted), full details in [facility].  ROV w/ Dr. Johney Frame [phone]

## 2013-05-30 ENCOUNTER — Ambulatory Visit: Payer: Medicare Other | Admitting: Internal Medicine

## 2013-06-06 ENCOUNTER — Telehealth: Payer: Self-pay | Admitting: Hematology and Oncology

## 2013-06-06 NOTE — Telephone Encounter (Signed)
called bother # one just rang and the other has no vm....mailed pt appt sched and avs with letter regarding rescheduled appt

## 2013-06-07 ENCOUNTER — Telehealth: Payer: Self-pay | Admitting: Internal Medicine

## 2013-06-07 ENCOUNTER — Ambulatory Visit (INDEPENDENT_AMBULATORY_CARE_PROVIDER_SITE_OTHER): Payer: Medicare Other | Admitting: General Practice

## 2013-06-07 DIAGNOSIS — I4891 Unspecified atrial fibrillation: Secondary | ICD-10-CM

## 2013-06-07 MED ORDER — TAMSULOSIN HCL 0.4 MG PO CAPS: 0.4000 mg | ORAL_CAPSULE | Freq: Every day | ORAL | Status: DC

## 2013-06-07 MED ORDER — ROPINIROLE HCL 4 MG PO TABS: 4.0000 mg | ORAL_TABLET | Freq: Every day | ORAL | Status: DC

## 2013-06-07 MED ORDER — PANTOPRAZOLE SODIUM 40 MG PO TBEC: 40.0000 mg | DELAYED_RELEASE_TABLET | Freq: Every morning | ORAL | Status: DC

## 2013-06-07 NOTE — Telephone Encounter (Signed)
Needs refill on ropinirole, tamsulosin and pantaprazole.  Needs to know if he still needs to take baby aspirin and clonazpam.  Patient is currently with Arline Asp but if he is gone you can reach him by phone.

## 2013-06-07 NOTE — Telephone Encounter (Signed)
First 3 meds refilled  Ok to stop ASA as he is taking coumadin  To continue the clonazepam as needed for stress

## 2013-06-07 NOTE — Telephone Encounter (Signed)
Called left message to call back 

## 2013-06-07 NOTE — Telephone Encounter (Signed)
Called the patient informed of MD instructions. 

## 2013-06-10 ENCOUNTER — Encounter: Payer: Self-pay | Admitting: Internal Medicine

## 2013-06-10 ENCOUNTER — Ambulatory Visit: Payer: Medicare Other | Admitting: Oncology

## 2013-06-10 ENCOUNTER — Other Ambulatory Visit: Payer: Medicare Other | Admitting: Lab

## 2013-06-14 ENCOUNTER — Ambulatory Visit (INDEPENDENT_AMBULATORY_CARE_PROVIDER_SITE_OTHER)
Admission: RE | Admit: 2013-06-14 | Discharge: 2013-06-14 | Disposition: A | Discharge status: Home / Self Care | Payer: Medicare Other | Source: Ambulatory Visit | Attending: Internal Medicine | Admitting: Internal Medicine

## 2013-06-14 ENCOUNTER — Ambulatory Visit (INDEPENDENT_AMBULATORY_CARE_PROVIDER_SITE_OTHER): Payer: Medicare Other | Admitting: Internal Medicine

## 2013-06-14 ENCOUNTER — Encounter: Payer: Self-pay | Admitting: Internal Medicine

## 2013-06-14 VITALS — BP 120/80 | HR 109 | Temp 97.8°F | Wt 151.5 lb

## 2013-06-14 DIAGNOSIS — I10 Essential (primary) hypertension: Secondary | ICD-10-CM

## 2013-06-14 DIAGNOSIS — M549 Dorsalgia, unspecified: Secondary | ICD-10-CM

## 2013-06-14 DIAGNOSIS — J449 Chronic obstructive pulmonary disease, unspecified: Secondary | ICD-10-CM

## 2013-06-14 NOTE — Patient Instructions (Signed)
Please continue all other medications as before, and refills have been done if requested. Please go to the XRAY Department in the Basement (go straight as you get off the elevator) for the x-ray testing You will be contacted by phone if any changes need to be made immediately.  Otherwise, you will receive a letter about your results with an explanation, but please check with MyChart first.  Please remember to sign up for My Chart if you have not done so, as this will be important to you in the future with finding out test results, communicating by private email, and scheduling acute appointments online when needed.

## 2013-06-15 NOTE — Assessment & Plan Note (Signed)
stable overall by history and exam, recent data reviewed with pt, and pt to continue medical treatment as before,  to f/u any worsening symptoms or concerns BP Readings from Last 3 Encounters:  06/14/13 120/80  05/03/13 98/60  04/08/13 128/62

## 2013-06-15 NOTE — Assessment & Plan Note (Signed)
For sacral films,  to f/u any worsening symptoms or concerns

## 2013-06-15 NOTE — Progress Notes (Signed)
Subjective:    Patient ID: [name] [name], male    DOB: 04-Aug-1926, 79 y.o.   MRN: [id number]  HPI  Here to f/u, unfort after a slip and fall backwards landing on sacrum 2 days ago, with persistent marked soreness, hard even now to sit with pain 8/10, no bowel or bladder change, fever, wt loss,  worsening LE pain/numbness/weakness.  Denies urinary symptoms such as dysuria, frequency, urgency, flank pain, hematuria or n/v, fever, chills.  Denies worsening reflux, abd pain, dysphagia, n/v, bowel change or blood. Pt denies chest pain, increased sob or doe, wheezing, orthopnea, PND, increased LE swelling, palpitations, dizziness or syncope.  Past Medical History  Diagnosis Date  . CAD (coronary artery disease)   . Hypertension   . Vitamin D deficiency   . Chronic back pain   . Anemia   . ED (erectile dysfunction)   . Arthritis   . RLS (restless legs syndrome)   . GERD (gastroesophageal reflux disease)   . COPD (chronic obstructive pulmonary disease)   . Hyperlipidemia   . Atrial fibrillation   . Second degree Mobitz II AV block     s/p PPM  . Asthma 09/15/2011  . Long term current use of anticoagulant 09/15/2011  . Thrombocytopenia 09/15/2011  . Lymphocytosis   . CHF (congestive heart failure) 01/01/2013   Past Surgical History  Procedure Laterality Date  . Coronary artery bypass graft    . Cataract extraction    . Pacemaker insertion      reports that he quit smoking about 4 years ago. His smoking use included Cigarettes. He has a 240 pack-year smoking history. He has never used smokeless tobacco. He reports that he drinks alcohol. He reports that he does not use illicit drugs. family history includes Alcohol abuse in his brother; Cancer in his brother and mother; Diabetes in his sister; Stroke in his father. Allergies  Allergen Reactions  . Amoxicillin Itching and Rash  . Penicillins Itching and Rash   Current Outpatient Prescriptions on File Prior to Visit  Medication Sig Dispense  Refill  . albuterol (PROVENTIL HFA;VENTOLIN HFA) 108 (90 BASE) MCG/ACT inhaler Inhale 2 puffs into the lungs every 6 (six) hours as needed. For wheezing or shortness of breath  1 Inhaler  11  . azelastine (ASTELIN) 137 MCG/SPRAY nasal spray Place 1 spray into the nose 2 (two) times daily. Use in each nostril as directed      . clonazePAM (KLONOPIN) 0.5 MG tablet TAKE ONE TO TWO TABLETS BY MOUTH AT BEDTIME AS NEEDED FOR SLEEP OR ANXIETY  45 tablet  5  . donepezil (ARICEPT) 5 MG tablet Take 5 mg by mouth at bedtime.      . fluticasone (FLONASE) 50 MCG/ACT nasal spray Place 2 sprays into the nose daily.  16 g  6  . furosemide (LASIX) 40 MG tablet Take 0.5 tablets (20 mg total) by mouth daily.  90 tablet  3  . isosorbide mononitrate (IMDUR) 30 MG 24 hr tablet Take 0.5 tablets (15 mg total) by mouth every morning.  45 tablet  3  . loratadine (CLARITIN) 10 MG tablet Take 10 mg by mouth every morning.       . methocarbamol (ROBAXIN) 500 MG tablet Take 1 tablet (500 mg total) by mouth 3 (three) times daily.  10 tablet  0  . metoprolol succinate (TOPROL-XL) 25 MG 24 hr tablet Take 1 tablet (25 mg total) by mouth at bedtime.  90 tablet  3  . mometasone-formoterol (DULERA)  200-5 MCG/ACT AERO Inhale 2 puffs into the lungs 2 (two) times daily.  13 g  11  . Multiple Vitamin (MULTIVITAMIN) tablet Take 1 tablet by mouth every morning.       Marland Kitchen oxyCODONE-acetaminophen (PERCOCET) 7.5-325 MG per tablet Take 1 tablet by mouth every 6 (six) hours as needed for pain. With limit 4 per day  120 tablet  0  . pantoprazole (PROTONIX) 40 MG tablet Take 1 tablet (40 mg total) by mouth every morning.  90 tablet  3  . rOPINIRole (REQUIP) 4 MG tablet Take 1 tablet (4 mg total) by mouth at bedtime.  60 tablet  5  . simvastatin (ZOCOR) 10 MG tablet take 1 tablet by mouth at bedtime  30 tablet  11  . tamsulosin (FLOMAX) 0.4 MG CAPS capsule Take 1 capsule (0.4 mg total) by mouth daily.  90 capsule  3  . tiotropium (SPIRIVA HANDIHALER)  18 MCG inhalation capsule Place 1 capsule (18 mcg total) into inhaler and inhale daily.  30 capsule  12  . warfarin (COUMADIN) 5 MG tablet Take 1 tablet (5 mg total) by mouth at bedtime. Take as directed by coumadin clinic.  90 tablet  1   No current facility-administered medications on file prior to visit.   Review of Systems  Constitutional: Negative for unexpected weight change, or unusual diaphoresis  HENT: Negative for tinnitus.   Eyes: Negative for photophobia and visual disturbance.  Respiratory: Negative for choking and stridor.   Gastrointestinal: Negative for vomiting and blood in stool.  Genitourinary: Negative for hematuria and decreased urine volume.  Musculoskeletal: Negative for acute joint swelling Skin: Negative for color change and wound.  Neurological: Negative for tremors and numbness other than noted  Psychiatric/Behavioral: Negative for decreased concentration or  hyperactivity.       Objective:   Physical Exam BP 120/80  Pulse 109  Temp(Src) 97.8 F (36.6 C) (Oral)  Wt 151 lb 8 oz (68.72 kg)  BMI 22.36 kg/m2  SpO2 94% VS noted,  Constitutional: Pt appears well-developed and well-nourished.  HENT: Head: NCAT.  Right Ear: External ear normal.  Left Ear: External ear normal.  Eyes: Conjunctivae and EOM are normal. Pupils are equal, round, and reactive to light.  Neck: Normal range of motion. Neck supple.  Cardiovascular: Normal rate and regular rhythm.   Pulmonary/Chest: Effort normal and breath sounds decreased, no rales or wheezing.  Abd:  Soft, NT, non-distended, + BS Neurological: Pt is alert, motor 5/5 Sacrum with mod tender, no swelilng or obvious defect Skin: Skin is warm. No erythema.  Psychiatric: Pt behavior is normal. Thought content normal.     Assessment & Plan:

## 2013-06-15 NOTE — Assessment & Plan Note (Signed)
stable overall by history and exam, recent data reviewed with pt, and pt to continue medical treatment as before,  to f/u any worsening symptoms or concerns SpO2 Readings from Last 3 Encounters:  06/14/13 94%  05/03/13 97%  04/08/13 97%

## 2013-06-25 ENCOUNTER — Telehealth: Payer: Self-pay | Admitting: Internal Medicine

## 2013-06-25 MED ORDER — CYCLOBENZAPRINE HCL 5 MG PO TABS: 5.0000 mg | ORAL_TABLET | Freq: Three times a day (TID) | ORAL | Status: DC | PRN

## 2013-06-25 MED ORDER — CLONAZEPAM 0.5 MG PO TABS: ORAL_TABLET | ORAL | Status: DC

## 2013-06-25 NOTE — Telephone Encounter (Signed)
Pt should consider OV here, or at urgent care or ER for films for back if pain is significant  Ok for repeat rx - Done hardcopy to [facility]

## 2013-06-25 NOTE — Telephone Encounter (Signed)
He has lost his flexoril and klonipin.   Daughter called who lives in [place].  Pt also fell out of bed and hurt his back.

## 2013-06-26 NOTE — Telephone Encounter (Signed)
Called the Patient and spoke to him informed of MD instructions. Faxed hardcopy of both prescriptions to [facility] per patient request.

## 2013-06-28 ENCOUNTER — Telehealth: Payer: Self-pay | Admitting: Hematology and Oncology

## 2013-06-28 NOTE — Telephone Encounter (Signed)
Pt dtr called and r/s 10/29 lb/NG to 11/4.

## 2013-07-01 ENCOUNTER — Telehealth: Payer: Self-pay | Admitting: Internal Medicine

## 2013-07-01 NOTE — Telephone Encounter (Signed)
07/01/2013   Pt left message stating that he was out of coumadin.  Please contact pt regarding the RX question.  [phone]

## 2013-07-02 ENCOUNTER — Encounter: Payer: Self-pay | Admitting: Internal Medicine

## 2013-07-02 ENCOUNTER — Other Ambulatory Visit: Payer: Self-pay | Admitting: General Practice

## 2013-07-02 ENCOUNTER — Ambulatory Visit (INDEPENDENT_AMBULATORY_CARE_PROVIDER_SITE_OTHER): Payer: Medicare Other | Admitting: Internal Medicine

## 2013-07-02 VITALS — BP 110/62 | HR 116 | Temp 97.5°F | Ht 69.0 in | Wt 146.2 lb

## 2013-07-02 DIAGNOSIS — F0390 Unspecified dementia without behavioral disturbance: Secondary | ICD-10-CM

## 2013-07-02 DIAGNOSIS — M25519 Pain in unspecified shoulder: Secondary | ICD-10-CM

## 2013-07-02 DIAGNOSIS — J449 Chronic obstructive pulmonary disease, unspecified: Secondary | ICD-10-CM

## 2013-07-02 DIAGNOSIS — I10 Essential (primary) hypertension: Secondary | ICD-10-CM

## 2013-07-02 DIAGNOSIS — M25511 Pain in right shoulder: Secondary | ICD-10-CM

## 2013-07-02 DIAGNOSIS — Z23 Encounter for immunization: Secondary | ICD-10-CM

## 2013-07-02 DIAGNOSIS — J4489 Other specified chronic obstructive pulmonary disease: Secondary | ICD-10-CM

## 2013-07-02 MED ORDER — WARFARIN SODIUM 5 MG PO TABS: ORAL_TABLET | ORAL | Status: DC

## 2013-07-02 MED ORDER — OXYCODONE-ACETAMINOPHEN 5-325 MG PO TABS: 1.0000 | ORAL_TABLET | Freq: Two times a day (BID) | ORAL | Status: DC | PRN

## 2013-07-02 MED ORDER — CYCLOBENZAPRINE HCL 5 MG PO TABS: 5.0000 mg | ORAL_TABLET | Freq: Three times a day (TID) | ORAL | Status: DC | PRN

## 2013-07-02 MED ORDER — CLONAZEPAM 0.5 MG PO TABS: ORAL_TABLET | ORAL | Status: DC

## 2013-07-02 NOTE — Patient Instructions (Addendum)
You had the flu shot today Please return in 2 wks for the Nurse Visit for the Prevnar Pneumonia shot OK to stop the claritin Please continue all other medications as before, and refills have been done if requested - the percocet, and flexeril Please have the pharmacy call with any other refills you may need.  No further lab work felt needed today  Please return in 6 months, or sooner if needed

## 2013-07-02 NOTE — Assessment & Plan Note (Signed)
Chronic recurrent, for pain med reduction to percocet 5/325 ,  to f/u any worsening symptoms or concerns

## 2013-07-02 NOTE — Assessment & Plan Note (Signed)
stable overall by history and exam, recent data reviewed with pt, and pt to continue medical treatment as before,  to f/u any worsening symptoms or concerns BP Readings from Last 3 Encounters:  07/02/13 110/62  06/14/13 120/80  05/03/13 98/60

## 2013-07-02 NOTE — Assessment & Plan Note (Signed)
stable overall by history and exam, recent data reviewed with pt, and pt to continue medical treatment as before,  to f/u any worsening symptoms or concerns Lab Results  Component Value Date   WBC 12.6* 02/07/2013   HGB 13.0 02/07/2013   HCT 40.7 02/07/2013   PLT 128* 02/07/2013   GLUCOSE 126* 01/01/2013   CHOL 104 11/27/2012   TRIG 46.0 11/27/2012   HDL 51.60 11/27/2012   LDLCALC 43 11/27/2012   ALT 30 12/24/2012   AST 36 12/24/2012   NA 140 01/01/2013   K 4.3 01/01/2013   CL 102 01/01/2013   CREATININE 1.2 01/01/2013   BUN 22 01/01/2013   CO2 33* 01/01/2013   TSH 1.04 11/27/2012   PSA 3.15 11/25/2009   INR 2.2 06/07/2013   HGBA1C 6.3* 09/05/2012

## 2013-07-02 NOTE — Assessment & Plan Note (Signed)
stable overall by history and exam, recent data reviewed with pt, and pt to continue medical treatment as before,  to f/u any worsening symptoms or concerns SpO2 Readings from Last 3 Encounters:  07/02/13 96%  06/14/13 94%  05/03/13 97%
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
## 2013-07-03 ENCOUNTER — Ambulatory Visit: Payer: Medicare Other | Admitting: Hematology and Oncology

## 2013-07-03 ENCOUNTER — Other Ambulatory Visit: Payer: Medicare Other | Admitting: Lab

## 2013-07-05 ENCOUNTER — Ambulatory Visit (INDEPENDENT_AMBULATORY_CARE_PROVIDER_SITE_OTHER): Payer: Medicare Other | Admitting: General Practice

## 2013-07-05 DIAGNOSIS — I4891 Unspecified atrial fibrillation: Secondary | ICD-10-CM

## 2013-07-05 LAB — POCT INR: INR: 1.6

## 2013-07-09 ENCOUNTER — Telehealth: Payer: Self-pay | Admitting: *Deleted

## 2013-07-09 ENCOUNTER — Other Ambulatory Visit: Payer: Medicare Other

## 2013-07-09 ENCOUNTER — Encounter: Payer: Medicare Other | Admitting: Hematology and Oncology

## 2013-07-09 NOTE — Progress Notes (Signed)
This encounter was created in error - please disregard.

## 2013-07-09 NOTE — Telephone Encounter (Signed)
[facility] Triage Call Report Triage Record Num: [id number] Operator: Kelle Darting Patient Name: [facility] Call Date & Time: 07/05/2013 11:20:27PM Patient Phone: [phone] PCP: Oliver Barre Patient Gender: Male PCP Fax : [phone] Patient DOB: [birth year] [facility] Name: [facility] Reason for Call: Caller: Vince/Grandson; PCP: Oliver Barre (Adults only); CB#: [id number]; Call regarding In Pain, Med Questions; Note: grandson states that pt. fell out of bed (07/03/13), bruised his back and having pain to same telling his grandson that he does not have any pain medications; Grandson states that he has Power of [place], is the only one that is to receive any medical information or pick-up his Rx; pt. is currently not with grandson who is out of state; Per pt. Epic chart, no information is found that grandson has power of attorney; Lucila Maine was not given any further information other than a Rx is seen for Oxycodone written on 07/02/13; advised to call the office on Monday; Note to office. Protocol(s) Used: Office Note Recommended Outcome per Protocol: Information Noted and Sent to Office Reason for Outcome: Caller information to office Care Advice: ~ 11/

## 2013-07-16 ENCOUNTER — Ambulatory Visit (INDEPENDENT_AMBULATORY_CARE_PROVIDER_SITE_OTHER): Payer: Medicare Other

## 2013-07-16 ENCOUNTER — Ambulatory Visit: Payer: Medicare Other

## 2013-07-16 DIAGNOSIS — Z23 Encounter for immunization: Secondary | ICD-10-CM

## 2013-07-29 ENCOUNTER — Other Ambulatory Visit: Payer: Self-pay | Admitting: Internal Medicine

## 2013-08-06 ENCOUNTER — Encounter: Payer: Self-pay | Admitting: *Deleted

## 2013-08-14 ENCOUNTER — Ambulatory Visit (INDEPENDENT_AMBULATORY_CARE_PROVIDER_SITE_OTHER): Payer: Medicare Other | Admitting: General Practice

## 2013-08-14 DIAGNOSIS — I4891 Unspecified atrial fibrillation: Secondary | ICD-10-CM

## 2013-08-14 LAB — POCT INR: INR: 1.8

## 2013-08-14 NOTE — Progress Notes (Signed)
Pre-visit discussion using our clinic review tool. No additional management support is needed unless otherwise documented below in the visit note.  

## 2013-08-15 ENCOUNTER — Telehealth: Payer: Self-pay | Admitting: *Deleted

## 2013-08-15 ENCOUNTER — Telehealth: Payer: Self-pay | Admitting: Internal Medicine

## 2013-08-15 MED ORDER — OXYCODONE-ACETAMINOPHEN 5-325 MG PO TABS: 1.0000 | ORAL_TABLET | Freq: Two times a day (BID) | ORAL | Status: DC | PRN

## 2013-08-15 NOTE — Telephone Encounter (Signed)
Patient's grandson is inquiring on the last time patient picked up scripts.  Please give a call in regard.

## 2013-08-15 NOTE — Telephone Encounter (Signed)
Called the patient left message to call back.  Did check log book at the front desk.  The patient picked up hardcopy's (signed book as pt. Picked up rx) on 06/23/13.  Left message to call back

## 2013-08-15 NOTE — Telephone Encounter (Signed)
Called the patients grandson Gloris Manchester, at [phone]) informed to pickup hardcopy of medication.  Also explained enclosed letter regarding change in pain medication refill policy.

## 2013-08-15 NOTE — Telephone Encounter (Signed)
Done hardcopy to robin, to also let family know:  (pt with dementia)  You are given the letter today explaining the transitional pain medication refill policy due to change in [place] law and [place] Med Board Regulations  Please be aware that I will no longer be able to offer monthly refills of any Schedule II or higher medication starting Oct 06, 2013

## 2013-08-15 NOTE — Telephone Encounter (Signed)
Appears last rx for both were written oct 28  The oxycodone cannot be refilled without new prescription, so it would be understandable if he has no med at this time, since only one month rx was given oct 28  The klonopin is refillable, and family should check with pharmacy regarding last refill date

## 2013-08-15 NOTE — Telephone Encounter (Signed)
Informed the grandson refills should be available on klonopin from the pharmacy.  Informed last prescriptions were picked up by the patient on 10./29/14.  The grandson Gloris [place]) stated he has been out of town for 2 months and that the patient has not had the klonopin or pain medication in his pill box.  Advise, and also he would like a refill on the oxycodone

## 2013-08-15 NOTE — Telephone Encounter (Signed)
[facility] Triage Call Report Triage Record Num: [id number] Operator: Craig Guess Patient Name: [name] [name] Call Date & Time: 08/14/2013 6:15:27PM Patient Phone: [phone] PCP: Oliver Barre Patient Gender: Male PCP Fax : [phone] Patient DOB: 03-Oct-1925 [facility] Name: [facility] Reason for Call: Caller: evince/Grandson; PCP: Oliver Barre (Adults only); CB#: [id number]; Call regarding Medication Issue; Medication(s): klonopin, oxycodone. Grandson calling to ask when last scripts for Klonopin and Oxycodone were picked up at office for this gentleman. Caller reports he believes someone has picked up his prescription for their own use. This RN unable to access [facility] this evening. Caller advised of the same and advised this issue will have to be addressed in the am. He is agreeable and will call in the am and speak with Burna Mortimer, as he has in the past. Protocol(s) Used: Office Note Recommended Outcome per Protocol: Information Noted and Sent to Office Reason for Outcome: Caller information to office Care Advice: ~ 08/14/2013 6:40:38PM Page 1 of 1 [id number]

## 2013-08-15 NOTE — Telephone Encounter (Signed)
Informed [name] [name] prescriptions were picked up by the patient on 07/03/13.

## 2013-08-15 NOTE — Addendum Note (Signed)
Addended by: Corwin Levins on: 08/15/2013 04:58 PM   Modules accepted: Orders

## 2013-09-03 ENCOUNTER — Ambulatory Visit (INDEPENDENT_AMBULATORY_CARE_PROVIDER_SITE_OTHER): Payer: Medicare Other | Admitting: Family Medicine

## 2013-09-03 ENCOUNTER — Other Ambulatory Visit (INDEPENDENT_AMBULATORY_CARE_PROVIDER_SITE_OTHER): Payer: Medicare Other

## 2013-09-03 VITALS — BP 130/76 | HR 89

## 2013-09-03 DIAGNOSIS — M25519 Pain in unspecified shoulder: Secondary | ICD-10-CM

## 2013-09-03 DIAGNOSIS — M25511 Pain in right shoulder: Secondary | ICD-10-CM

## 2013-09-03 DIAGNOSIS — M12519 Traumatic arthropathy, unspecified shoulder: Secondary | ICD-10-CM

## 2013-09-03 DIAGNOSIS — M12811 Other specific arthropathies, not elsewhere classified, right shoulder: Secondary | ICD-10-CM

## 2013-09-03 MED ORDER — GABAPENTIN 100 MG PO CAPS: 100.0000 mg | ORAL_CAPSULE | Freq: Every day | ORAL | Status: DC

## 2013-09-03 NOTE — Progress Notes (Signed)
Pre-visit discussion using our clinic review tool. No additional management support is needed unless otherwise documented below in the visit note.  

## 2013-09-03 NOTE — Patient Instructions (Addendum)
Very nice to meet you Try exercises 3 days a week.  Gabapentin at night.  Start with 1 pill and titrate to 2 pills at night in 1 week.  Ice or heat can help. Take tylenol 650 mg three times a day is the best evidence based medicine we have for arthritis.  Glucosamine sulfate 750mg  twice a day is a supplement that has been shown to help moderate to severe arthritis. Vitamin D 1000 IU daily Fish oil 2 grams daily.  Tumeric 500mg  twice daily.  Capsaicin topically up to four times a day may also help with pain.  Come back in 1 month and come back to make sure you are doing better.

## 2013-09-04 ENCOUNTER — Encounter: Payer: Self-pay | Admitting: Family Medicine

## 2013-09-04 DIAGNOSIS — M12811 Other specific arthropathies, not elsewhere classified, right shoulder: Secondary | ICD-10-CM | POA: Insufficient documentation

## 2013-09-04 NOTE — Assessment & Plan Note (Signed)
Discuss multiple different treatment options with patient as well as his caretaker. Patient's comorbidities limited options. Patient did have an injection today which did give him some relief. Patient is still going to have crepitus and does have some lack of range of motion secondary to be bone-on-bone arthritis. Patient given home exercise program to try to see if we can get some strengthening. Discussed over-the-counter medications that can help with arthritis meniscus protein supplementation. Patient likely will need more injections and we can repeat this every 3-4 months if necessary. Patient may return in the interim to have injection on the contralateral side.

## 2013-09-04 NOTE — Progress Notes (Signed)
I'm seeing this patient by the request  of:  [name] Barre, MD   CC: Bilateral shoulder pain right greater than left  HPI: Patient is a very pleasant 79 year old gentleman with a significant past medical history for coronary artery disease, status post bypass with ICD, congestive heart failure, atrial fibrillation coming in with a complaint of right shoulder pain. Patient states that he has pain in both shoulders but the right one seems to be worse. Hurts with any type of movement. Describes it more as a sharp pain with certain movements. Patient states that he feels is some weakness as well. Denies any radiation of pain denies any numbness. Patient has not tried any home modalities for his pain. Patient rates the pain 8/10 in severity. Patient is accompanied with his daughter who is his primary caretaker.   Past medical, surgical, family and social history reviewed. Medications reviewed all in the electronic medical record.   Review of Systems: No headache, visual changes, nausea, vomiting, diarrhea, constipation, dizziness, abdominal pain, skin rash, fevers, chills, night sweats, weight loss, swollen lymph nodes, body aches, joint swelling, muscle aches, chest pain, shortness of breath, mood changes.   Objective:    Blood pressure 130/76, pulse 89, SpO2 99.00%.   General: No apparent distress alert and oriented x3 mood and affect normal, dressed appropriately. Cachectic man HEENT: Pupils equal, extraocular movements intact Respiratory: Patient's speak in full sentences and does not appear short of breath Cardiovascular: No lower extremity edema, non tender, no erythema Skin: Warm dry intact with no signs of infection or rash on extremities or on axial skeleton. Multiple seborrheic keratosis Abdomen: Soft nontender Neuro: Cranial nerves II through XII are intact, neurovascularly intact in all extremities with 2+ DTRs and 2+ pulses. Lymph: No lymphadenopathy of posterior or anterior cervical  chain or axillae bilaterally.  Gait patient does walk with a shuffling gait MSK: Non tender with full range of motion and good stability and symmetric strength and tone of , elbows, wrist, hip, knee and ankles bilaterally.  Shoulder: Right Inspection reveals patient does have atrophy of the shoulder musculature bilaterally Palpation has mild generalized tenderness of both shoulders right greater than left ROM is full in all planes passively but has significant amount of crepitus. Rotator cuff strength is intact in 4/5 strength that is symmetric with the contralateral side  signs of impingement with positive Neer and Hawkin's tests, empty can sign. Speeds and Yergason's tests normal.  labral pathology noted with positive Obrien's, negative clunk and good stability. Normal scapular function observed.  painful arc noted. No apprehension sign   MSK US performed of: Right shoulder This study was ordered, performed, and interpreted by Terrilee Files D.O.  Shoulder:   Supraspinatus: Show some degenerative changes as well as a tear of the articular side. No retraction noted. Significant osteoarthritic changes in the area. Significant atrophy of the muscle Infraspinatus:  Appears normal on long and transverse views. Mild atrophy Subscapularis:  Appears normal on long and transverse views. Atrophy of the muscle Teres Minor:  Appears normal on long and transverse views. AC joint:  Severe osteoarthritic changes. Glenohumeral Joint:  Severe osteoarthritic changes Glenoid Labrum:  Difficult to visualize Biceps Tendon:  Appears normal on long and transverse views, no fraying of tendon, tendon located in intertubercular groove, no subluxation with shoulder internal or external rotation. No increased power doppler signal. Impression: Rotator cuff arthropathy with severe arthritis  Procedure: Real-time Ultrasound Guided Injection of right glenohumeral joint Device: GE Logiq E  Ultrasound guided  injection  is preferred based studies that show increased duration, increased effect, greater accuracy, decreased procedural pain, increased response rate with ultrasound guided versus blind injection.  Verbal informed consent obtained.  Time-out conducted.  Noted no overlying erythema, induration, or other signs of local infection.  Skin prepped in a sterile fashion.  Local anesthesia: Topical Ethyl chloride.  With sterile technique and under real time ultrasound guidance:  Joint visualized.  23g 1  inch needle inserted posterior approach. Pictures taken for needle placement. Patient did have injection of 2 cc of 1% lidocaine, 2 cc of 0.5% Marcaine, and 1.0 cc of Kenalog 40 mg/dL. Completed without difficulty  Pain immediately resolved suggesting accurate placement of the medication.  Advised to call if fevers/chills, erythema, induration, drainage, or persistent bleeding.  Images permanently stored and available for review in the ultrasound unit.  Impression: Technically successful ultrasound guided injection.   Impression and Recommendations:     This case required medical decision making of moderate complexity.

## 2013-09-11 ENCOUNTER — Ambulatory Visit (INDEPENDENT_AMBULATORY_CARE_PROVIDER_SITE_OTHER): Payer: Medicare Other | Admitting: General Practice

## 2013-09-11 DIAGNOSIS — I4891 Unspecified atrial fibrillation: Secondary | ICD-10-CM

## 2013-09-11 LAB — POCT INR: INR: 1.2

## 2013-09-11 NOTE — Progress Notes (Signed)
Pre-visit discussion using our clinic review tool. No additional management support is needed unless otherwise documented below in the visit note.  

## 2013-09-18 ENCOUNTER — Ambulatory Visit (INDEPENDENT_AMBULATORY_CARE_PROVIDER_SITE_OTHER): Payer: Medicare Other | Admitting: General Practice

## 2013-09-18 DIAGNOSIS — I4891 Unspecified atrial fibrillation: Secondary | ICD-10-CM

## 2013-09-18 LAB — POCT INR: INR: 2

## 2013-09-18 NOTE — Progress Notes (Signed)
Pre-visit discussion using our clinic review tool. No additional management support is needed unless otherwise documented below in the visit note.  

## 2013-09-19 ENCOUNTER — Encounter: Payer: Self-pay | Admitting: Internal Medicine

## 2013-09-19 ENCOUNTER — Ambulatory Visit (INDEPENDENT_AMBULATORY_CARE_PROVIDER_SITE_OTHER): Payer: Medicare Other | Admitting: Internal Medicine

## 2013-09-19 ENCOUNTER — Telehealth: Payer: Self-pay | Admitting: *Deleted

## 2013-09-19 VITALS — BP 115/62 | HR 60 | Ht 69.0 in | Wt 146.4 lb

## 2013-09-19 DIAGNOSIS — I4821 Permanent atrial fibrillation: Secondary | ICD-10-CM

## 2013-09-19 DIAGNOSIS — I10 Essential (primary) hypertension: Secondary | ICD-10-CM

## 2013-09-19 DIAGNOSIS — I2510 Atherosclerotic heart disease of native coronary artery without angina pectoris: Secondary | ICD-10-CM

## 2013-09-19 DIAGNOSIS — I4891 Unspecified atrial fibrillation: Secondary | ICD-10-CM

## 2013-09-19 DIAGNOSIS — I441 Atrioventricular block, second degree: Secondary | ICD-10-CM

## 2013-09-19 DIAGNOSIS — I25810 Atherosclerosis of coronary artery bypass graft(s) without angina pectoris: Secondary | ICD-10-CM

## 2013-09-19 LAB — MDC_IDC_ENUM_SESS_TYPE_INCLINIC
Brady Statistic RA Percent Paced: 0.11 %
Brady Statistic RV Percent Paced: 92 %
Implantable Pulse Generator Model: 2210
Lead Channel Impedance Value: 412.5 Ohm
Lead Channel Impedance Value: 412.5 Ohm
Lead Channel Pacing Threshold Amplitude: 1.25 V
Lead Channel Pacing Threshold Pulse Width: 0.8 ms
Lead Channel Sensing Intrinsic Amplitude: 1.8 mV
Lead Channel Sensing Intrinsic Amplitude: 2.9 mV
Lead Channel Setting Pacing Amplitude: 2.5 V
Lead Channel Setting Pacing Pulse Width: 0.8 ms
MDC IDC MSMT BATTERY REMAINING LONGEVITY: 49.2 mo
MDC IDC MSMT BATTERY VOLTAGE: 2.9 V
MDC IDC PG SERIAL: 2326107
MDC IDC SESS DTM: 20150115130049
MDC IDC SET LEADCHNL RA PACING AMPLITUDE: 2 V
MDC IDC SET LEADCHNL RV SENSING SENSITIVITY: 1 mV

## 2013-09-19 NOTE — Telephone Encounter (Signed)
called home and mobile to see if pt was still coming to appointment. no answer with either number.

## 2013-09-19 NOTE — Progress Notes (Signed)
PCP: Cathlean Cower, MD Primary Cardiologist:  Previously Dr Oswaldo Milian Ormand is a 80 y.o. male who presents today for routine electrophysiology followup.  Since last being seen in our clinic, the patient reports doing very well.  Today, he denies symptoms of palpitations, chest pain,  lower extremity edema, dizziness, presyncope, or syncope.  He has chronic lung disease which continues to be an issue.  The patient is otherwise without complaint today.   Past Medical History  Diagnosis Date  . CAD (coronary artery disease)   . Hypertension   . Vitamin D deficiency   . Chronic back pain   . Anemia   . ED (erectile dysfunction)   . Arthritis   . RLS (restless legs syndrome)   . GERD (gastroesophageal reflux disease)   . COPD (chronic obstructive pulmonary disease)   . Hyperlipidemia   . Atrial fibrillation   . Second degree Mobitz II AV block     s/p PPM  . Asthma 09/15/2011  . Long term current use of anticoagulant 09/15/2011  . Thrombocytopenia 09/15/2011  . Lymphocytosis   . CHF (congestive heart failure) 01/01/2013   Past Surgical History  Procedure Laterality Date  . Coronary artery bypass graft    . Cataract extraction    . Pacemaker insertion      Current Outpatient Prescriptions  Medication Sig Dispense Refill  . albuterol (PROVENTIL HFA;VENTOLIN HFA) 108 (90 BASE) MCG/ACT inhaler Inhale 2 puffs into the lungs every 6 (six) hours as needed. For wheezing or shortness of breath  1 Inhaler  11  . azelastine (ASTELIN) 137 MCG/SPRAY nasal spray Place 1 spray into the nose 2 (two) times daily. Use in each nostril as directed      . clonazePAM (KLONOPIN) 0.5 MG tablet TAKE ONE TO TWO TABLETS BY MOUTH AT BEDTIME AS NEEDED FOR SLEEP OR ANXIETY  45 tablet  5  . cyclobenzaprine (FLEXERIL) 5 MG tablet Take 1 tablet (5 mg total) by mouth 3 (three) times daily as needed for muscle spasms.  90 tablet  1  . donepezil (ARICEPT) 5 MG tablet TAKE ONE TABLET BY MOUTH AT BEDTIME AS NEEDED  90  tablet  3  . fluticasone (FLONASE) 50 MCG/ACT nasal spray Place 2 sprays into the nose daily.  16 g  6  . furosemide (LASIX) 40 MG tablet Take 0.5 tablets (20 mg total) by mouth daily.  90 tablet  3  . gabapentin (NEURONTIN) 100 MG capsule Take 1 capsule (100 mg total) by mouth at bedtime. Increase to 200mg  at night after 1 week.  90 capsule  3  . isosorbide mononitrate (IMDUR) 30 MG 24 hr tablet Take 0.5 tablets (15 mg total) by mouth every morning.  45 tablet  3  . methocarbamol (ROBAXIN) 500 MG tablet Take 1 tablet (500 mg total) by mouth 3 (three) times daily.  10 tablet  0  . metoprolol succinate (TOPROL-XL) 25 MG 24 hr tablet Take 1 tablet (25 mg total) by mouth at bedtime.  90 tablet  3  . mometasone-formoterol (DULERA) 200-5 MCG/ACT AERO Inhale 2 puffs into the lungs 2 (two) times daily.  13 g  11  . Multiple Vitamin (MULTIVITAMIN) tablet Take 1 tablet by mouth every morning.       Marland Kitchen oxyCODONE-acetaminophen (ROXICET) 5-325 MG per tablet Take 1 tablet by mouth 2 (two) times daily as needed.  60 tablet  0  . pantoprazole (PROTONIX) 40 MG tablet Take 1 tablet (40 mg total) by mouth every  morning.  90 tablet  3  . rOPINIRole (REQUIP) 4 MG tablet Take 1 tablet (4 mg total) by mouth at bedtime.  60 tablet  5  . simvastatin (ZOCOR) 10 MG tablet take 1 tablet by mouth at bedtime  30 tablet  11  . tamsulosin (FLOMAX) 0.4 MG CAPS capsule Take 1 capsule (0.4 mg total) by mouth daily.  90 capsule  3  . tiotropium (SPIRIVA HANDIHALER) 18 MCG inhalation capsule Place 1 capsule (18 mcg total) into inhaler and inhale daily.  30 capsule  12  . warfarin (COUMADIN) 5 MG tablet Take as directed by anticoagulation clinic  120 tablet  0   No current facility-administered medications for this visit.    Physical Exam: Filed Vitals:   09/19/13 1241  BP: 115/62  Pulse: 60  Height: 5\' 9"  (1.753 m)  Weight: 146 lb 6 oz (66.395 kg)    GEN- The patient is well appearing, alert and oriented x 3 today.   Head-  normocephalic, atraumatic Eyes-  Sclera clear, conjunctiva pink Ears- hearing intact Oropharynx- clear Lungs- prolonged expiratory phase, + expiratory wheezes Chest- pacemaker pocket is well healed Heart- Regular rate and rhythm, no murmurs, rubs or gallops, PMI not laterally displaced GI- soft, NT, ND, + BS Extremities- no clubbing, cyanosis, or edema  Pacemaker interrogation- reviewed in detail today,  See PACEART report  Assessment and Plan:  MOBITZ II ATRIOVENTRICULAR BLOCK  Normal pacemaker function, though RV threshold is chronically elevated with low R waves (stable).   See Pace Art report  No changes today   ATRIAL FIBRILLATION, PAROXYSMAL  Continue coumadin long term  CHADS2VASC score is at least 5  HTN Stable No change required today  CAD No ischemic symptoms No changes today  Merlin Return to see Richardson Dopp in 6 months I will see in a year

## 2013-09-19 NOTE — Patient Instructions (Addendum)
Your physician wants you to follow-up in: 6 months with [name] [name] and 12 months with Dr Vallery Ridge will receive a reminder letter in the mail two months in advance. If you don't receive a letter, please call our office to schedule the follow-up appointment.    Remote monitoring is used to monitor your Pacemaker of ICD from home. This monitoring reduces the number of office visits required to check your device to one time per year. It allows [place] to keep an eye on the functioning of your device to ensure it is working properly. You are scheduled for a device check from home on 12/23/13. You may send your transmission at any time that day. If you have a wireless device, the transmission will be sent automatically. After your physician reviews your transmission, you will receive a postcard with your next transmission date.

## 2013-10-02 ENCOUNTER — Ambulatory Visit: Payer: Medicare Other | Admitting: Family Medicine

## 2013-10-02 ENCOUNTER — Ambulatory Visit (INDEPENDENT_AMBULATORY_CARE_PROVIDER_SITE_OTHER): Payer: Medicare Other | Admitting: Family Medicine

## 2013-10-02 ENCOUNTER — Encounter: Payer: Self-pay | Admitting: Family Medicine

## 2013-10-02 VITALS — BP 118/62 | HR 70 | Temp 97.3°F | Resp 16 | Wt 140.1 lb

## 2013-10-02 DIAGNOSIS — M12811 Other specific arthropathies, not elsewhere classified, right shoulder: Secondary | ICD-10-CM

## 2013-10-02 DIAGNOSIS — M75101 Unspecified rotator cuff tear or rupture of right shoulder, not specified as traumatic: Principal | ICD-10-CM

## 2013-10-02 DIAGNOSIS — M19019 Primary osteoarthritis, unspecified shoulder: Secondary | ICD-10-CM

## 2013-10-02 NOTE — Progress Notes (Signed)
  I'm seeing this patient by the request  of:  Cathlean Cower, MD   CC: Bilateral shoulder pain right greater than left  HPI: Patient is following up for his right shoulder pain. Patient states that the shoulder pain is approximately 25-50% better. Patient has been using his arm significantly more when she does like but unfortunately does cause some more discomfort. Patient states that his left shoulder seems to be hurting him more at this time. Patient did not start any of the over-the-counter medicines or the home exercises on a regular basis. Patient has not icing her doing any the other suggestions at this time. Patient states he just forgot. Denies any new symptoms.   Past medical, surgical, family and social history reviewed. Medications reviewed all in the electronic medical record.   Review of Systems: No headache, visual changes, nausea, vomiting, diarrhea, constipation, dizziness, abdominal pain, skin rash, fevers, chills, night sweats, weight loss, swollen lymph nodes, body aches, joint swelling, muscle aches, chest pain, shortness of breath, mood changes.   Objective:    Blood pressure 118/62, pulse 70, temperature 97.3 F (36.3 C), temperature source Oral, resp. rate 16, weight 140 lb 1.9 oz (63.558 kg), SpO2 94.00%.   General: No apparent distress alert and oriented x3 mood and affect normal, dressed appropriately. Cachectic man HEENT: Pupils equal, extraocular movements intact Respiratory: Patient's speak in full sentences and does not appear short of breath Cardiovascular: No lower extremity edema, non tender, no erythema Skin: Warm dry intact with no signs of infection or rash on extremities or on axial skeleton. Multiple seborrheic keratosis Abdomen: Soft nontender Neuro: Cranial nerves II through XII are intact, neurovascularly intact in all extremities with 2+ DTRs and 2+ pulses. Lymph: No lymphadenopathy of posterior or anterior cervical chain or axillae bilaterally.  Gait  patient does walk with a shuffling gait MSK: Non tender with full range of motion and good stability and symmetric strength and tone of , elbows, wrist, hip, knee and ankles bilaterally.  Shoulder: Right Inspection reveals patient does have atrophy of the shoulder musculature bilaterally Palpation shows patient is nontender ROM is full in all planes passively but has significant amount of crepitus. Rotator cuff strength is intact in 4/5 strength that is symmetric with the contralateral side  signs of impingement with positive Neer and Hawkin's tests, empty can sign but improved from previous exam Speeds and Yergason's tests normal.  labral pathology noted with positive Obrien's, negative clunk and good stability. Normal scapular function observed.  painful arc noted. No apprehension sign Very similar presentation to contralateral side except left shoulder actually has decreased range of motion compared to the contralateral side lacking the last 10 of forward flexion as well as internal rotation.     Impression and Recommendations:     This case required medical decision making of moderate complexity.

## 2013-10-02 NOTE — Assessment & Plan Note (Addendum)
Patient did respond very well to the steroid injection previously and does have increased range of motion and may be mild increase in strength compared to the previous visit. Discuss potentially trying the same intervention on the left shoulder which patient declined today. Patient was given home exercises again as well as the recommendations of the over-the-counter medicines for arthritis pain. Patient will try these interventions and followup again in 4 weeks. I would like to try to avoid doing injections any sooner than 2 months the patient is not a surgical candidate so that does help his symptoms then we will continue. Patient will followup again in 4 weeks Spent greater than 25 minutes with patient face-to-face and had greater than 50% of counseling including as described above in assessment and plan.

## 2013-10-02 NOTE — Progress Notes (Signed)
Pre-visit discussion using our clinic review tool. No additional management support is needed unless otherwise documented below in the visit note.  

## 2013-10-02 NOTE — Patient Instructions (Signed)
Good to see you It seems to have helped your right shoulder which I am happy You wanted to wait on a injection on your left shoulder and that is ok Try these exercises 3 times a week Take tylenol 650 mg three times a day is the best evidence based medicine we have for arthritis.  Glucosamine sulfate 750mg  twice a day is a supplement that has been shown to help moderate to severe arthritis. Vitamin D 2000 IU daily Fish oil 2 grams daily.  Tumeric 500mg  twice daily.  Capsaicin topically up to four times a day can help  Look at handout and may want to call your insurance to see if it is covered.  Come back in 4 weeks or sooner if pain becomes worse.

## 2013-10-09 ENCOUNTER — Ambulatory Visit (INDEPENDENT_AMBULATORY_CARE_PROVIDER_SITE_OTHER): Payer: Medicare Other | Admitting: General Practice

## 2013-10-09 DIAGNOSIS — Z5181 Encounter for therapeutic drug level monitoring: Secondary | ICD-10-CM

## 2013-10-09 DIAGNOSIS — I4891 Unspecified atrial fibrillation: Secondary | ICD-10-CM

## 2013-10-09 LAB — POCT INR: INR: 1.2

## 2013-10-09 NOTE — Progress Notes (Signed)
Pre-visit discussion using our clinic review tool. No additional management support is needed unless otherwise documented below in the visit note.  

## 2013-10-29 ENCOUNTER — Other Ambulatory Visit: Payer: Self-pay | Admitting: Internal Medicine

## 2013-10-30 ENCOUNTER — Other Ambulatory Visit: Payer: Self-pay | Admitting: Internal Medicine

## 2013-10-30 ENCOUNTER — Ambulatory Visit: Payer: Medicare Other | Admitting: Family Medicine

## 2013-10-30 ENCOUNTER — Ambulatory Visit: Payer: Medicare Other | Admitting: Internal Medicine

## 2013-10-30 ENCOUNTER — Other Ambulatory Visit: Payer: Self-pay | Admitting: General Practice

## 2013-10-30 MED ORDER — WARFARIN SODIUM 5 MG PO TABS: ORAL_TABLET | ORAL | Status: DC

## 2013-10-30 NOTE — Telephone Encounter (Signed)
Ok to forward to coumadin clinic

## 2013-11-03 ENCOUNTER — Other Ambulatory Visit: Payer: Self-pay | Admitting: Internal Medicine

## 2013-11-04 NOTE — Telephone Encounter (Signed)
To forward to coumadin clinic

## 2013-11-05 ENCOUNTER — Encounter: Payer: Self-pay | Admitting: Family Medicine

## 2013-11-05 ENCOUNTER — Ambulatory Visit (INDEPENDENT_AMBULATORY_CARE_PROVIDER_SITE_OTHER): Payer: Medicare Other | Admitting: Family Medicine

## 2013-11-05 VITALS — BP 126/62 | HR 85 | Temp 97.4°F | Resp 16 | Wt 148.4 lb

## 2013-11-05 DIAGNOSIS — M19019 Primary osteoarthritis, unspecified shoulder: Secondary | ICD-10-CM

## 2013-11-05 DIAGNOSIS — M12811 Other specific arthropathies, not elsewhere classified, right shoulder: Secondary | ICD-10-CM

## 2013-11-05 DIAGNOSIS — M75101 Unspecified rotator cuff tear or rupture of right shoulder, not specified as traumatic: Principal | ICD-10-CM

## 2013-11-05 NOTE — Assessment & Plan Note (Signed)
Patient does have bilateral rotator cuff arthropathy. Patient will continue with conservative therapy. We discussed different treatment options including intra-articular injection which patient declined again today. Patient also declined formal physical therapy. Patient will do home exercise and was given theraband today. Patient to follow up on an as needed basis and if he wants any other intervention done.

## 2013-11-05 NOTE — Patient Instructions (Signed)
Good to see you Try exercises 3 times a week.  Ice 20 minutes can help with the pain.  See me when you need me I can do another injection whenever you need! Make an appointment with Dr. Jenny Reichmann for medication reconciliation.

## 2013-11-05 NOTE — Progress Notes (Signed)
  CC: Bilateral shoulder pain left worse.   HPI: Patient is here to followup on his left shoulder pain. Patient previously had pain in the right shoulder and did have an injection with near complete resolution of pain. Patient elected to try formal physical therapy and home exercise program on the left shoulder. Patient does have severe osteophytic changes with rotator cuff degenerative tearing bilaterally. Patient states continuing to have pain the minorly better. Patient is not doing the exercises regularly and is not taking any medicines. Patient did not even remember that he had the appointment. Patient states overall he is about the same.   Past medical, surgical, family and social history reviewed. Medications reviewed all in the electronic medical record.   Review of Systems: No headache, visual changes, nausea, vomiting, diarrhea, constipation, dizziness, abdominal pain, skin rash, fevers, chills, night sweats, weight loss, swollen lymph nodes, body aches, joint swelling, muscle aches, chest pain, shortness of breath, mood changes.   Objective:    Blood pressure 126/62, pulse 85, temperature 97.4 F (36.3 C), temperature source Oral, resp. rate 16, weight 148 lb 6.4 oz (67.314 kg), SpO2 95.00%.   General: No apparent distress alert and oriented x3 mood and affect normal, dressed appropriately. Cachectic man HEENT: Pupils equal, extraocular movements intact Respiratory: Patient's speak in full sentences and does not appear short of breath Cardiovascular: No lower extremity edema, non tender, no erythema Skin: Warm dry intact with no signs of infection or rash on extremities or on axial skeleton. Multiple seborrheic keratosis Abdomen: Soft nontender Neuro: Cranial nerves II through XII are intact, neurovascularly intact in all extremities with 2+ DTRs and 2+ pulses. Lymph: No lymphadenopathy of posterior or anterior cervical chain or axillae bilaterally.  Gait patient does walk with a  shuffling gait MSK: Non tender with full range of motion and good stability and symmetric strength and tone of , elbows, wrist, hip, knee and ankles bilaterally.  Shoulder: Left Inspection reveals patient does have atrophy of the shoulder musculature bilaterally Palpation shows patient is nontender ROM is full in all planes passively but has significant amount of crepitus. Rotator cuff strength is intact in 4/5 strength that is symmetric with the contralateral side  signs of impingement with positive Neer and Hawkin's tests, empty can sign but improved from previous exam Speeds and Yergason's tests normal.  labral pathology noted with positive Obrien's, negative clunk and good stability. Normal scapular function observed.  painful arc noted. No apprehension sign Right shoulder still has significant crepitus and mild pain with range of motion testing. Patient has 4-5 strength compared to the other side as well. Neurovascularly intact distally.     Impression and Recommendations:     This case required medical decision making of moderate complexity.

## 2013-11-05 NOTE — Progress Notes (Signed)
Pre visit review using our clinic review tool, if applicable. No additional management support is needed unless otherwise documented below in the visit note. 

## 2013-11-13 ENCOUNTER — Other Ambulatory Visit: Payer: Self-pay

## 2013-11-13 MED ORDER — ALBUTEROL SULFATE HFA 108 (90 BASE) MCG/ACT IN AERS: 2.0000 | INHALATION_SPRAY | Freq: Four times a day (QID) | RESPIRATORY_TRACT | Status: DC | PRN

## 2013-12-03 ENCOUNTER — Ambulatory Visit (INDEPENDENT_AMBULATORY_CARE_PROVIDER_SITE_OTHER): Payer: Medicare Other | Admitting: General Practice

## 2013-12-03 DIAGNOSIS — Z5181 Encounter for therapeutic drug level monitoring: Secondary | ICD-10-CM

## 2013-12-03 DIAGNOSIS — I4891 Unspecified atrial fibrillation: Secondary | ICD-10-CM

## 2013-12-03 LAB — POCT INR: INR: 1.4

## 2013-12-03 NOTE — Progress Notes (Signed)
Pre visit review using our clinic review tool, if applicable. No additional management support is needed unless otherwise documented below in the visit note. 

## 2013-12-10 ENCOUNTER — Ambulatory Visit (INDEPENDENT_AMBULATORY_CARE_PROVIDER_SITE_OTHER): Payer: Medicare Other | Admitting: General Practice

## 2013-12-10 DIAGNOSIS — Z5181 Encounter for therapeutic drug level monitoring: Secondary | ICD-10-CM

## 2013-12-10 DIAGNOSIS — I4891 Unspecified atrial fibrillation: Secondary | ICD-10-CM

## 2013-12-10 LAB — POCT INR: INR: 1.5

## 2013-12-10 NOTE — Progress Notes (Signed)
Pre visit review using our clinic review tool, if applicable. No additional management support is needed unless otherwise documented below in the visit note. 

## 2013-12-23 ENCOUNTER — Ambulatory Visit (INDEPENDENT_AMBULATORY_CARE_PROVIDER_SITE_OTHER): Payer: Medicare Other | Admitting: *Deleted

## 2013-12-23 ENCOUNTER — Encounter: Payer: Self-pay | Admitting: Internal Medicine

## 2013-12-23 DIAGNOSIS — I441 Atrioventricular block, second degree: Secondary | ICD-10-CM

## 2013-12-23 LAB — MDC_IDC_ENUM_SESS_TYPE_REMOTE
Brady Statistic AP VS Percent: 1 %
Brady Statistic AS VP Percent: 57 %
Brady Statistic AS VS Percent: 43 %
Brady Statistic RA Percent Paced: 1 %
Brady Statistic RV Percent Paced: 57 %
Lead Channel Impedance Value: 390 Ohm
Lead Channel Pacing Threshold Amplitude: 0.875 V
Lead Channel Sensing Intrinsic Amplitude: 4.5 mV
Lead Channel Setting Pacing Amplitude: 2.5 V
Lead Channel Setting Pacing Pulse Width: 0.8 ms
Lead Channel Setting Sensing Sensitivity: 1 mV
MDC IDC MSMT BATTERY REMAINING LONGEVITY: 56 mo
MDC IDC MSMT BATTERY VOLTAGE: 2.9 V
MDC IDC MSMT LEADCHNL RA IMPEDANCE VALUE: 390 Ohm
MDC IDC MSMT LEADCHNL RA PACING THRESHOLD PULSEWIDTH: 0.4 ms
MDC IDC MSMT LEADCHNL RA SENSING INTR AMPL: 1.8 mV
MDC IDC MSMT LEADCHNL RV PACING THRESHOLD AMPLITUDE: 1.25 V
MDC IDC MSMT LEADCHNL RV PACING THRESHOLD PULSEWIDTH: 0.8 ms
MDC IDC PG SERIAL: 2326107
MDC IDC SESS DTM: 20150420061229
MDC IDC SET LEADCHNL RA PACING AMPLITUDE: 2 V
MDC IDC STAT BRADY AP VP PERCENT: 1 %

## 2013-12-24 ENCOUNTER — Other Ambulatory Visit: Payer: Self-pay | Admitting: Internal Medicine

## 2013-12-25 ENCOUNTER — Encounter: Payer: Self-pay | Admitting: Internal Medicine

## 2013-12-27 ENCOUNTER — Ambulatory Visit: Payer: Medicare Other

## 2013-12-31 ENCOUNTER — Encounter: Payer: Self-pay | Admitting: *Deleted

## 2013-12-31 ENCOUNTER — Ambulatory Visit: Payer: Medicare Other | Admitting: Internal Medicine

## 2014-01-06 NOTE — Progress Notes (Signed)
PPM remote 

## 2014-01-09 ENCOUNTER — Telehealth: Payer: Self-pay | Admitting: Internal Medicine

## 2014-01-09 NOTE — Telephone Encounter (Signed)
I am unable to help in this regard;  Will forward to Lebron Conners, Glass blower/designer

## 2014-01-09 NOTE — Telephone Encounter (Signed)
01/09/14 Fonda Kinder (daughter) called to inquire if updated designated party forms restricting privileges for Ortencia Kick and Molly Maduro was received, the envelope was found in incoming mail. DPF exists for caller. Informed caller Healthcare Power of Attorney still lists other designees. She mentioned she will be presenting updated POA that only lists her name.   Currently she is asking for a call to be placed to her father who has changed his cell number to [phone]. Unfortunately Mr. Atteberry does not know his password and cannot clear out his voice mails or check them also he does not keep it charged. The house number has been disconnected and he has moved in with his brother in [place]. If unable to reach him please call Fonda Kinder [phone] (she is listed on the DPF) and relay the results to her please. Miss Rosana Hoes indicated Jenny Reichmann the visiting nurse came out this week to see her father but he is unable to provide information on his coumadin results or any medication changes. rmf

## 2014-01-10 ENCOUNTER — Telehealth: Payer: Self-pay

## 2014-01-10 NOTE — Telephone Encounter (Signed)
Message left to call Amy at [facility] at [phone] [phone] to let them know if we had received PT/INR for this patient.  Did call this number and extension, but no answer and unable to leave a message.  Did check on MD's desk did not see PT/INR at this time.

## 2014-01-10 NOTE — Telephone Encounter (Addendum)
01/10/14-[name] [name] arrived in Medical Records asking for results of coumadin levels and provided a piece of paper requesting all of her fathers records be transferred to [facility]. I am unable to fulfill verbal request, I provided ROI form to her to provide to her father for signature. rmf

## 2014-01-21 NOTE — Telephone Encounter (Signed)
01/20/14-Signed authorization received for records to be transferred to [facility]. Request fulfilled, 67 pages sent, called and verified receipt by [facility]. Called [phone] and left message to update Miss Rosana Hoes.

## 2014-02-03 ENCOUNTER — Ambulatory Visit: Payer: Medicare Other | Admitting: Family Medicine

## 2014-02-19 ENCOUNTER — Emergency Department: Payer: Self-pay | Admitting: Emergency Medicine

## 2014-02-19 LAB — PROTIME-INR
INR: 2.3
PROTHROMBIN TIME: 25.1 s — AB (ref 11.5–14.7)

## 2014-03-12 ENCOUNTER — Encounter: Payer: Self-pay | Admitting: Internal Medicine

## 2014-03-13 ENCOUNTER — Emergency Department: Payer: Self-pay | Admitting: Emergency Medicine

## 2014-03-13 LAB — CBC
HCT: 36.7 % — ABNORMAL LOW (ref 40.0–52.0)
HGB: 11.8 g/dL — AB (ref 13.0–18.0)
MCH: 31.2 pg (ref 26.0–34.0)
MCHC: 32.1 g/dL (ref 32.0–36.0)
MCV: 97 fL (ref 80–100)
Platelet: 66 10*3/uL — ABNORMAL LOW (ref 150–440)
RBC: 3.77 10*6/uL — ABNORMAL LOW (ref 4.40–5.90)
RDW: 14.5 % (ref 11.5–14.5)
WBC: 14.8 10*3/uL — ABNORMAL HIGH (ref 3.8–10.6)

## 2014-03-13 LAB — PRO B NATRIURETIC PEPTIDE: B-Type Natriuretic Peptide: 5171 pg/mL — ABNORMAL HIGH (ref 0–450)

## 2014-03-13 LAB — COMPREHENSIVE METABOLIC PANEL
Albumin: 3.5 g/dL (ref 3.4–5.0)
Alkaline Phosphatase: 76 U/L
Anion Gap: 6 — ABNORMAL LOW (ref 7–16)
BUN: 13 mg/dL (ref 7–18)
Bilirubin,Total: 1.6 mg/dL — ABNORMAL HIGH (ref 0.2–1.0)
CALCIUM: 8.8 mg/dL (ref 8.5–10.1)
CREATININE: 1.17 mg/dL (ref 0.60–1.30)
Chloride: 107 mmol/L (ref 98–107)
Co2: 30 mmol/L (ref 21–32)
EGFR (African American): >60
GFR CALC NON AF AMER: 56 — AB
GLUCOSE: 119 mg/dL — AB (ref 65–99)
OSMOLALITY: 286 (ref 275–301)
Potassium: 3.6 mmol/L (ref 3.5–5.1)
SGOT(AST): 35 U/L (ref 15–37)
SGPT (ALT): 38 U/L (ref 12–78)
SODIUM: 143 mmol/L (ref 136–145)
Total Protein: 6.4 g/dL (ref 6.4–8.2)

## 2014-03-13 LAB — PROTIME-INR
INR: 4.1 — AB
PROTHROMBIN TIME: 38.3 s — AB (ref 11.5–14.7)

## 2014-03-13 LAB — TROPONIN I: Troponin-I: 0.03 ng/mL

## 2014-03-14 ENCOUNTER — Other Ambulatory Visit: Payer: Self-pay | Admitting: Internal Medicine

## 2014-03-26 ENCOUNTER — Telehealth: Payer: Self-pay | Admitting: Cardiology

## 2014-03-26 ENCOUNTER — Ambulatory Visit: Payer: Self-pay | Admitting: Cardiovascular Disease

## 2014-03-26 ENCOUNTER — Ambulatory Visit (INDEPENDENT_AMBULATORY_CARE_PROVIDER_SITE_OTHER): Payer: Medicare Other | Admitting: *Deleted

## 2014-03-26 DIAGNOSIS — I441 Atrioventricular block, second degree: Secondary | ICD-10-CM

## 2014-03-26 NOTE — Telephone Encounter (Signed)
Spoke with pt and reminded pt of remote transmission that is due today. Pt verbalized understanding.   

## 2014-03-26 NOTE — Telephone Encounter (Signed)
Spoke with [name] [name] and confirmed remote transmission. [name] [name] is in [place] now and will be back home tomorrow. [name] [name] wants me to call [name] [phone] and walk her through transmission. [name] [name] leaves with [name]

## 2014-03-27 NOTE — Telephone Encounter (Signed)
Called Ms. [name] [name] back and she wanted me to call her later.

## 2014-03-28 NOTE — Progress Notes (Signed)
Remote pacemaker transmission.   

## 2014-03-31 ENCOUNTER — Ambulatory Visit: Payer: Self-pay | Admitting: Cardiovascular Disease

## 2014-03-31 LAB — MDC_IDC_ENUM_SESS_TYPE_REMOTE
Battery Remaining Longevity: 55 mo
Battery Remaining Percentage: 53 %
Battery Voltage: 2.9 V
Brady Statistic AP VP Percent: 1 %
Brady Statistic AS VS Percent: 40 %
Brady Statistic RA Percent Paced: 1 %
Date Time Interrogation Session: 20150709010233
Implantable Pulse Generator Model: 2210
Lead Channel Impedance Value: 380 Ohm
Lead Channel Impedance Value: 390 Ohm
Lead Channel Sensing Intrinsic Amplitude: 1.8 mV
Lead Channel Setting Pacing Amplitude: 2 V
Lead Channel Setting Pacing Amplitude: 2.5 V
Lead Channel Setting Pacing Pulse Width: 0.8 ms
Lead Channel Setting Sensing Sensitivity: 1 mV
MDC IDC MSMT LEADCHNL RV SENSING INTR AMPL: 5.4 mV
MDC IDC PG SERIAL: 2326107
MDC IDC STAT BRADY AP VS PERCENT: 1 %
MDC IDC STAT BRADY AS VP PERCENT: 60 %
MDC IDC STAT BRADY RV PERCENT PACED: 60 %

## 2014-04-17 ENCOUNTER — Encounter: Payer: Self-pay | Admitting: Cardiology

## 2014-04-21 ENCOUNTER — Encounter: Payer: Self-pay | Admitting: Cardiology

## 2014-04-28 ENCOUNTER — Encounter: Payer: Self-pay | Admitting: Internal Medicine

## 2014-04-28 ENCOUNTER — Ambulatory Visit (INDEPENDENT_AMBULATORY_CARE_PROVIDER_SITE_OTHER): Payer: Medicare Other | Admitting: Internal Medicine

## 2014-04-28 VITALS — BP 124/61 | HR 60 | Ht 69.0 in | Wt 152.8 lb

## 2014-04-28 DIAGNOSIS — Z0000 Encounter for general adult medical examination without abnormal findings: Secondary | ICD-10-CM

## 2014-04-28 DIAGNOSIS — I2510 Atherosclerotic heart disease of native coronary artery without angina pectoris: Secondary | ICD-10-CM

## 2014-04-28 DIAGNOSIS — I25810 Atherosclerosis of coronary artery bypass graft(s) without angina pectoris: Secondary | ICD-10-CM

## 2014-04-28 DIAGNOSIS — Z0189 Encounter for other specified special examinations: Secondary | ICD-10-CM

## 2014-04-28 DIAGNOSIS — I441 Atrioventricular block, second degree: Secondary | ICD-10-CM

## 2014-04-28 DIAGNOSIS — I4891 Unspecified atrial fibrillation: Secondary | ICD-10-CM

## 2014-04-28 DIAGNOSIS — I10 Essential (primary) hypertension: Secondary | ICD-10-CM

## 2014-04-28 LAB — MDC_IDC_ENUM_SESS_TYPE_INCLINIC
Brady Statistic RV Percent Paced: 62 %
Date Time Interrogation Session: 20150824172053
Implantable Pulse Generator Model: 2210
Implantable Pulse Generator Serial Number: 2326107
Lead Channel Impedance Value: 425 Ohm
Lead Channel Pacing Threshold Amplitude: 0.875 V
Lead Channel Pacing Threshold Amplitude: 1.25 V
Lead Channel Pacing Threshold Pulse Width: 0.4 ms
Lead Channel Sensing Intrinsic Amplitude: 1.3 mV
Lead Channel Sensing Intrinsic Amplitude: 2.2 mV
Lead Channel Setting Pacing Amplitude: 2.5 V
MDC IDC MSMT BATTERY REMAINING LONGEVITY: 73.2 mo
MDC IDC MSMT BATTERY VOLTAGE: 2.9 V
MDC IDC MSMT LEADCHNL RA IMPEDANCE VALUE: 400 Ohm
MDC IDC MSMT LEADCHNL RV PACING THRESHOLD PULSEWIDTH: 0.8 ms
MDC IDC SET LEADCHNL RA PACING AMPLITUDE: 2 V
MDC IDC SET LEADCHNL RV PACING PULSEWIDTH: 0.8 ms
MDC IDC SET LEADCHNL RV SENSING SENSITIVITY: 1 mV
MDC IDC STAT BRADY RA PERCENT PACED: 0.01 %

## 2014-04-28 NOTE — Patient Instructions (Addendum)
Your physician wants you to follow-up in: 12 months Dr Caryl Comes in [facility] will receive a reminder letter in the mail two months in advance. If you don't receive a letter, please call our office to schedule the follow-up appointment.  Remote monitoring is used to monitor your Pacemaker of ICD from home. This monitoring reduces the number of office visits required to check your device to one time per year. It allows [place] to keep an eye on the functioning of your device to ensure it is working properly. You are scheduled for a device check from home on 07/30/14. You may send your transmission at any time that day. If you have a wireless device, the transmission will be sent automatically. After your physician reviews your transmission, you will receive a postcard with your next transmission date.  You have been referred to Dr Ronette Deter for his primary care  You have been referred to Dr Rockey Situ or Dr Fletcher Anon for general Cardiology

## 2014-04-28 NOTE — Progress Notes (Signed)
PCP: Elsie Stain, MD Primary Cardiologist:  Previously Dr Oswaldo Milian Chizek is a 80 y.o. male who presents today for routine electrophysiology followup.  Since last being seen in our clinic, the patient reports doing very well.  He appears to be having social issues within his family.  He sold his house in [place] and spent several months living with his sister in [place].  He is now living in [place].  He presents today for follow-up.  He is not certain as to which medicines that he is taking. Today, he denies symptoms of palpitations, chest pain,  lower extremity edema, dizziness, presyncope, or syncope. He has stable SOB (due to chronic lung disease).  His coumadin has been switched to xarelto and he is happy with this change.  The patient is otherwise without complaint today.   Past Medical History  Diagnosis Date  . CAD (coronary artery disease)   . Hypertension   . Vitamin D deficiency   . Chronic back pain   . Anemia   . ED (erectile dysfunction)   . Arthritis   . RLS (restless legs syndrome)   . GERD (gastroesophageal reflux disease)   . COPD (chronic obstructive pulmonary disease)   . Hyperlipidemia   . Atrial fibrillation   . Second degree Mobitz II AV block     s/p PPM  . Asthma 09/15/2011  . Long term current use of anticoagulant 09/15/2011  . Thrombocytopenia 09/15/2011  . Lymphocytosis   . CHF (congestive heart failure) 01/01/2013   Past Surgical History  Procedure Laterality Date  . Coronary artery bypass graft    . Cataract extraction    . Pacemaker insertion      ROS- all systems are reviewed and negative except as per HPI above  Current Outpatient Prescriptions  Medication Sig Dispense Refill  . albuterol (PROVENTIL HFA;VENTOLIN HFA) 108 (90 BASE) MCG/ACT inhaler Inhale 2 puffs into the lungs every 6 (six) hours as needed. For wheezing or shortness of breath  18 g  11  . cyclobenzaprine (FLEXERIL) 5 MG tablet Take 1 tablet (5 mg total) by mouth 3  (three) times daily as needed for muscle spasms.  90 tablet  1  . diphenhydrAMINE (BENADRYL) 25 mg capsule Take 25 mg by mouth every 6 (six) hours as needed for allergies.      Marland Kitchen donepezil (ARICEPT) 5 MG tablet TAKE ONE TABLET BY MOUTH AT BEDTIME AS NEEDED  90 tablet  3  . furosemide (LASIX) 40 MG tablet Take 0.5 tablets (20 mg total) by mouth daily.  90 tablet  3  . gabapentin (NEURONTIN) 100 MG capsule Take 1 capsule (100 mg total) by mouth at bedtime. Increase to 200mg  at night after 1 week.  90 capsule  3  . isosorbide mononitrate (IMDUR) 30 MG 24 hr tablet Take 0.5 tablets (15 mg total) by mouth every morning.  45 tablet  3  . Multiple Vitamin (MULTIVITAMIN) tablet Take 1 tablet by mouth every morning.       . pantoprazole (PROTONIX) 40 MG tablet Take 1 tablet (40 mg total) by mouth every morning.  90 tablet  3  . Rivaroxaban (XARELTO) 15 MG TABS tablet Take 15 mg by mouth daily with supper.      Marland Kitchen rOPINIRole (REQUIP) 4 MG tablet TAKE ONE TABLET BY MOUTH AT BEDTIME  60 tablet  5  . simvastatin (ZOCOR) 10 MG tablet take 1 tablet by mouth at bedtime  30 tablet  11  . tamsulosin (FLOMAX)  0.4 MG CAPS capsule Take 1 capsule (0.4 mg total) by mouth daily.  90 capsule  3  . methocarbamol (ROBAXIN) 500 MG tablet Take 1 tablet (500 mg total) by mouth 3 (three) times daily.  10 tablet  0  . metoprolol succinate (TOPROL-XL) 25 MG 24 hr tablet Take 1 tablet (25 mg total) by mouth at bedtime.  90 tablet  3  . mometasone-formoterol (DULERA) 200-5 MCG/ACT AERO Inhale 2 puffs into the lungs 2 (two) times daily.  13 g  11  . oxyCODONE-acetaminophen (ROXICET) 5-325 MG per tablet Take 1 tablet by mouth 2 (two) times daily as needed.  60 tablet  0  . tiotropium (SPIRIVA HANDIHALER) 18 MCG inhalation capsule Place 1 capsule (18 mcg total) into inhaler and inhale daily.  30 capsule  12   No current facility-administered medications for this visit.    Physical Exam: Filed Vitals:   04/28/14 1646  BP: 124/61   Pulse: 60  Height: 5\' 9"  (1.753 m)  Weight: 152 lb 12.8 oz (69.31 kg)    GEN- The patient is elderly and frail appearing, alert and oriented x 3 today.   Head- normocephalic, atraumatic Eyes-  Sclera clear, conjunctiva pink Ears- hearing intact Oropharynx- clear Lungs- prolonged expiratory phase, normal work of breathing Chest- pacemaker pocket is well healed Heart- irregular rate and rhythm, no murmurs, rubs or gallops, PMI not laterally displaced GI- soft, NT, ND, + BS Extremities- no clubbing, cyanosis, or edema  Pacemaker interrogation- reviewed in detail today,  See PACEART report  Assessment and Plan:  1. Mobitz II second degree AV block Normal pacemaker function though his RV threshold is chronically elevated and R waves are chronically low.  This has been stable for him for quite some time. See Pace Art report No changes today  2. afib chads2vasc score is at lealst 5.  He is doing well on xarelto.  No changes today  3. htn Stable No change required today  4. Cad Stable No change required today  [place] As he has moved to [place], he would like to have his cardiology care there going forward.   I will therefore refer to Dr Rockey Situ for general cardiology and Dr Caryl Comes for device management.  I am happy to see him at anytime going forward.

## 2014-05-02 ENCOUNTER — Encounter: Payer: Self-pay | Admitting: Internal Medicine

## 2014-05-20 ENCOUNTER — Telehealth: Payer: Self-pay | Admitting: *Deleted

## 2014-05-20 MED ORDER — ALBUTEROL SULFATE HFA 108 (90 BASE) MCG/ACT IN AERS: 2.0000 | INHALATION_SPRAY | Freq: Four times a day (QID) | RESPIRATORY_TRACT | Status: DC | PRN

## 2014-05-20 MED ORDER — TIOTROPIUM BROMIDE MONOHYDRATE 18 MCG IN CAPS: 18.0000 ug | ORAL_CAPSULE | Freq: Every day | RESPIRATORY_TRACT | Status: DC

## 2014-05-20 NOTE — Telephone Encounter (Signed)
Called stating brother is needing refill on his inhaler. Verify which inhaler pt wasn't sure of name. Inform him will send refill on spiriva & albuterol but will need to make yearly follow-up in Oct.../lmb

## 2014-06-16 ENCOUNTER — Telehealth: Payer: Self-pay | Admitting: Cardiology

## 2014-06-16 ENCOUNTER — Encounter: Payer: Medicare Other | Admitting: *Deleted

## 2014-06-16 NOTE — Telephone Encounter (Signed)
Spoke with pt and reminded pt of remote transmission that is due today. Pt verbalized understanding.   

## 2014-06-17 ENCOUNTER — Encounter: Payer: Self-pay | Admitting: Cardiology

## 2014-06-23 ENCOUNTER — Telehealth: Payer: Self-pay | Admitting: Cardiology

## 2014-06-23 NOTE — Telephone Encounter (Signed)
Spoke with pt and helped him send manual transmission.

## 2014-07-01 ENCOUNTER — Encounter: Payer: Self-pay | Admitting: *Deleted

## 2014-07-01 ENCOUNTER — Other Ambulatory Visit: Payer: Self-pay | Admitting: *Deleted

## 2014-07-02 ENCOUNTER — Ambulatory Visit: Payer: Medicare Other | Admitting: Cardiovascular Disease

## 2014-07-07 ENCOUNTER — Ambulatory Visit (INDEPENDENT_AMBULATORY_CARE_PROVIDER_SITE_OTHER): Payer: Medicare Other | Admitting: Family Medicine

## 2014-07-07 ENCOUNTER — Encounter: Payer: Self-pay | Admitting: Family Medicine

## 2014-07-07 ENCOUNTER — Other Ambulatory Visit: Payer: Self-pay | Admitting: Family Medicine

## 2014-07-07 VITALS — BP 110/64 | HR 60 | Temp 98.0°F | Resp 18 | Ht 69.0 in | Wt 154.0 lb

## 2014-07-07 DIAGNOSIS — Z23 Encounter for immunization: Secondary | ICD-10-CM

## 2014-07-07 DIAGNOSIS — I25709 Atherosclerosis of coronary artery bypass graft(s), unspecified, with unspecified angina pectoris: Secondary | ICD-10-CM

## 2014-07-07 DIAGNOSIS — Z0000 Encounter for general adult medical examination without abnormal findings: Secondary | ICD-10-CM

## 2014-07-07 DIAGNOSIS — I480 Paroxysmal atrial fibrillation: Secondary | ICD-10-CM

## 2014-07-07 MED ORDER — DONEPEZIL HCL 5 MG PO TABS: 5.0000 mg | ORAL_TABLET | Freq: Every day | ORAL | Status: DC

## 2014-07-07 MED ORDER — FUROSEMIDE 40 MG PO TABS: 20.0000 mg | ORAL_TABLET | Freq: Every day | ORAL | Status: DC

## 2014-07-07 MED ORDER — GABAPENTIN 100 MG PO CAPS: 100.0000 mg | ORAL_CAPSULE | Freq: Every day | ORAL | Status: DC

## 2014-07-07 MED ORDER — ISOSORBIDE MONONITRATE ER 30 MG PO TB24: 15.0000 mg | ORAL_TABLET | Freq: Every morning | ORAL | Status: DC

## 2014-07-07 MED ORDER — ROPINIROLE HCL 4 MG PO TABS: 4.0000 mg | ORAL_TABLET | Freq: Every day | ORAL | Status: DC

## 2014-07-07 MED ORDER — TIOTROPIUM BROMIDE MONOHYDRATE 18 MCG IN CAPS: 18.0000 ug | ORAL_CAPSULE | Freq: Every day | RESPIRATORY_TRACT | Status: DC

## 2014-07-07 MED ORDER — PANTOPRAZOLE SODIUM 40 MG PO TBEC: 40.0000 mg | DELAYED_RELEASE_TABLET | Freq: Every morning | ORAL | Status: DC

## 2014-07-07 MED ORDER — CYCLOBENZAPRINE HCL 5 MG PO TABS: 5.0000 mg | ORAL_TABLET | Freq: Three times a day (TID) | ORAL | Status: DC | PRN

## 2014-07-07 MED ORDER — SIMVASTATIN 10 MG PO TABS: 10.0000 mg | ORAL_TABLET | Freq: Every day | ORAL | Status: DC

## 2014-07-07 MED ORDER — ALBUTEROL SULFATE HFA 108 (90 BASE) MCG/ACT IN AERS: 2.0000 | INHALATION_SPRAY | Freq: Four times a day (QID) | RESPIRATORY_TRACT | Status: AC | PRN

## 2014-07-07 NOTE — Progress Notes (Signed)
Subjective:    Patient ID: [name] [name], male    DOB: Dec 31, 1925, 80 y.o.   MRN: [id number]  HPI Patient is here today for complete physical exam and to establish care. He is accompanied by his sister. He is now living with his sister. He has mild dementia for which he takes Aricept 5 mg by mouth daily.  He also has a history of atrial fibrillation. He has been off anticoagulant therapy  Recently as he ran out of his medication. He was previously on xarelto 15 mg poqday.  When this ran out, the patient resumed Coumadin. He has only been taking Coumadin for a few days. He denies any bleeding or bruising. He also has a history of hypertension, coronary artery disease, COPD/asthma. He currently uses albuterol 2 puffs inhaled every 6 hours as needed for wheezing and shortness of breath. He uses that medication 1-2 times a day. He is requesting a refill on his inhaler. His pneumonia vaccine is up-to-date. He had Prevnar 13 last year. He is due for his flu shot this year. He is also due for a colonoscopy as well as an annual prostate exam. Had a long discussion today with the patient and his sister. Given his complicated medical history and his advanced age I recommended against a screening colonoscopy as well as screening for prostate cancer. Both the patient and his sister agree. They are interested in a flu shot. Past Medical History  Diagnosis Date  . CAD (coronary artery disease)   . Hypertension   . Vitamin D deficiency   . Chronic back pain   . Anemia   . ED (erectile dysfunction)   . Arthritis   . RLS (restless legs syndrome)   . GERD (gastroesophageal reflux disease)   . COPD (chronic obstructive pulmonary disease)   . Hyperlipidemia   . Atrial fibrillation   . Second degree Mobitz II AV block     s/p PPM  . Asthma 09/15/2011  . Long term current use of anticoagulant 09/15/2011  . Thrombocytopenia 09/15/2011  . Lymphocytosis   . CHF (congestive heart failure) 01/01/2013  . Allergy     Past Surgical History  Procedure Laterality Date  . Coronary artery bypass graft    . Cataract extraction    . Pacemaker insertion     Current Outpatient Prescriptions on File Prior to Visit  Medication Sig Dispense Refill  . diphenhydrAMINE (BENADRYL) 25 mg capsule Take 25 mg by mouth every 6 (six) hours as needed for allergies.    . Multiple Vitamin (MULTIVITAMIN) tablet Take 1 tablet by mouth every morning.     . tamsulosin (FLOMAX) 0.4 MG CAPS capsule Take 1 capsule (0.4 mg total) by mouth daily. 90 capsule 3  . Rivaroxaban (XARELTO) 15 MG TABS tablet Take 15 mg by mouth daily with supper.     No current facility-administered medications on file prior to visit.   Allergies  Allergen Reactions  . Amoxicillin Itching and Rash  . Penicillins Itching and Rash   History   Social History  . Marital Status: Widowed    Spouse Name: N/A    Number of Children: 2  . Years of Education: N/A   Occupational History  .      retired Hydrologist   Social History Main Topics  . Smoking status: Former Smoker -- 3.00 packs/day for 80 years    Types: Cigarettes    Quit date: 09/05/2008  . Smokeless tobacco: Never Used  . Alcohol Use:  Yes     Comment: occasional 4 drinks per week  . Drug Use: No  . Sexual Activity: Not Currently   Other Topics Concern  . Not on file   Social History Narrative   Family History  Problem Relation Age of Onset  . Diabetes Sister   . Cancer Mother     lung cancer  . Stroke Father   . Cancer Brother     brain cancer  . Alcohol abuse Brother       Review of Systems  All other systems reviewed and are negative.      Objective:   Physical Exam  Constitutional: He is oriented to person, place, and time. He appears well-developed and well-nourished. No distress.  HENT:  Head: Normocephalic and atraumatic.  Right Ear: External ear normal.  Left Ear: External ear normal.  Nose: Nose normal.  Mouth/Throat: Oropharynx is clear and  moist. No oropharyngeal exudate.  Eyes: Conjunctivae and EOM are normal. Pupils are equal, round, and reactive to light. Right eye exhibits no discharge. Left eye exhibits no discharge. No scleral icterus.  Neck: Normal range of motion. Neck supple. No JVD present. No thyromegaly present.  Cardiovascular: Normal rate and normal heart sounds.  An irregularly irregular rhythm present.  Pulmonary/Chest: Effort normal and breath sounds normal. No respiratory distress. He has no wheezes. He has no rales.  Abdominal: Soft. Bowel sounds are normal. He exhibits no distension. There is no tenderness. There is no rebound and no guarding.  Musculoskeletal: Normal range of motion. He exhibits no edema.  Lymphadenopathy:    He has no cervical adenopathy.  Neurological: He is alert and oriented to person, place, and time. He has normal reflexes. He displays normal reflexes. No cranial nerve deficit. He exhibits normal muscle tone. Coordination normal.  Skin: Skin is warm. No rash noted. He is not diaphoretic. No erythema. No pallor.  Vitals reviewed.         Assessment & Plan:  Need for prophylactic vaccination and inoculation against influenza - Plan: Flu Vaccine QUAD 36+ mos PF IM (Fluarix Quad PF)  Routine general medical examination at a health care facility  Patient's physical exam is significant for an irregular heart rhythm consistent with atrial fibrillation. It is also consistent with mild dementia. Otherwise his exam is normal today. I will have the patient resume xarelto 15 mg poqday and discontinue warfarin. Patient received his flu shot today. After a long discussion with the patient and his sister we agreed not to proceed with a colonoscopy or prostate cancer screening. I will also check a CBC, CMP, fasting lipid panel, and TSH. I've asked the patient to return fasting for this lab work.Marland Kitchen

## 2014-07-07 NOTE — Telephone Encounter (Signed)
Medication refilled per protocol. 

## 2014-07-08 ENCOUNTER — Other Ambulatory Visit: Payer: Medicare Other

## 2014-07-08 DIAGNOSIS — Z0000 Encounter for general adult medical examination without abnormal findings: Secondary | ICD-10-CM

## 2014-07-08 LAB — COMPLETE METABOLIC PANEL WITH GFR
ALBUMIN: 4 g/dL (ref 3.5–5.2)
ALK PHOS: 67 U/L (ref 39–117)
AST: 15 U/L (ref 0–37)
BILIRUBIN TOTAL: 0.9 mg/dL (ref 0.2–1.2)
BUN: 13 mg/dL (ref 6–23)
CO2: 35 meq/L — AB (ref 19–32)
Calcium: 9.3 mg/dL (ref 8.4–10.5)
Chloride: 103 mEq/L (ref 96–112)
Creat: 1.27 mg/dL (ref 0.50–1.35)
GFR, EST AFRICAN AMERICAN: 59 mL/min — AB
GFR, Est Non African American: 51 mL/min — ABNORMAL LOW
Glucose, Bld: 88 mg/dL (ref 70–99)
POTASSIUM: 4.5 meq/L (ref 3.5–5.3)
SODIUM: 142 meq/L (ref 135–145)
TOTAL PROTEIN: 5.8 g/dL — AB (ref 6.0–8.3)

## 2014-07-08 LAB — TSH: TSH: 1.358 u[IU]/mL (ref 0.350–4.500)

## 2014-07-08 LAB — CBC WITH DIFFERENTIAL/PLATELET
BASOS PCT: 0 % (ref 0–1)
Basophils Absolute: 0 10*3/uL (ref 0.0–0.1)
Eosinophils Absolute: 0.1 10*3/uL (ref 0.0–0.7)
Eosinophils Relative: 1 % (ref 0–5)
HEMATOCRIT: 36 % — AB (ref 39.0–52.0)
HEMOGLOBIN: 11.7 g/dL — AB (ref 13.0–17.0)
Lymphocytes Relative: 73 % — ABNORMAL HIGH (ref 12–46)
Lymphs Abs: 9.9 10*3/uL — ABNORMAL HIGH (ref 0.7–4.0)
MCH: 29.1 pg (ref 26.0–34.0)
MCHC: 32.5 g/dL (ref 30.0–36.0)
MCV: 89.6 fL (ref 78.0–100.0)
MONO ABS: 0.7 10*3/uL (ref 0.1–1.0)
Monocytes Relative: 5 % (ref 3–12)
Neutro Abs: 2.8 10*3/uL (ref 1.7–7.7)
Neutrophils Relative %: 21 % — ABNORMAL LOW (ref 43–77)
Platelets: 93 10*3/uL — ABNORMAL LOW (ref 150–400)
RBC: 4.02 MIL/uL — AB (ref 4.22–5.81)
RDW: 14.3 % (ref 11.5–15.5)
WBC: 13.5 10*3/uL — ABNORMAL HIGH (ref 4.0–10.5)

## 2014-07-08 LAB — LIPID PANEL
CHOL/HDL RATIO: 2.5 ratio
Cholesterol: 108 mg/dL (ref 0–200)
HDL: 44 mg/dL (ref >39)
LDL CALC: 53 mg/dL (ref 0–99)
Triglycerides: 57 mg/dL (ref <150)
VLDL: 11 mg/dL (ref 0–40)

## 2014-07-09 ENCOUNTER — Emergency Department: Payer: Self-pay | Admitting: Emergency Medicine

## 2014-07-11 ENCOUNTER — Other Ambulatory Visit: Payer: Self-pay | Admitting: Family Medicine

## 2014-07-11 DIAGNOSIS — R799 Abnormal finding of blood chemistry, unspecified: Secondary | ICD-10-CM

## 2014-07-11 DIAGNOSIS — D72829 Elevated white blood cell count, unspecified: Secondary | ICD-10-CM

## 2014-07-16 ENCOUNTER — Encounter: Payer: Self-pay | Admitting: Cardiology

## 2014-07-17 ENCOUNTER — Encounter: Payer: Self-pay | Admitting: Physician Assistant

## 2014-07-17 ENCOUNTER — Ambulatory Visit (INDEPENDENT_AMBULATORY_CARE_PROVIDER_SITE_OTHER): Payer: Medicare Other | Admitting: Physician Assistant

## 2014-07-17 VITALS — BP 114/64 | HR 64 | Temp 98.2°F | Resp 18 | Wt 155.0 lb

## 2014-07-17 DIAGNOSIS — Z4802 Encounter for removal of sutures: Secondary | ICD-10-CM

## 2014-07-17 NOTE — Progress Notes (Signed)
Patient ID: [name] [name] MRN: [id number], DOB: 11-02-1925, 80 y.o. Date of Encounter: 07/17/2014, 4:01 PM    Chief Complaint:  Chief Complaint  Patient presents with  . staples removed from scalp wound     HPI: 80 y.o. year old white male reports that he got the staples placed into his scalp in the ER in [place] last Thursday which was July 10, 2014. Was told to have the staples removed in 1 week. He has no other complaints or concerns.     Home Meds:   Outpatient Prescriptions Prior to Visit  Medication Sig Dispense Refill  . albuterol (PROVENTIL HFA;VENTOLIN HFA) 108 (90 BASE) MCG/ACT inhaler Inhale 2 puffs into the lungs every 6 (six) hours as needed. For wheezing or shortness of breath 18 g 11  . cyclobenzaprine (FLEXERIL) 5 MG tablet Take 1 tablet (5 mg total) by mouth 3 (three) times daily as needed for muscle spasms. 90 tablet 2  . diphenhydrAMINE (BENADRYL) 25 mg capsule Take 25 mg by mouth every 6 (six) hours as needed for allergies.    Marland Kitchen donepezil (ARICEPT) 5 MG tablet Take 1 tablet (5 mg total) by mouth at bedtime. 90 tablet 1  . furosemide (LASIX) 40 MG tablet Take 0.5 tablets (20 mg total) by mouth daily. 45 tablet 1  . gabapentin (NEURONTIN) 100 MG capsule Take 1 capsule (100 mg total) by mouth at bedtime. Increase to 200mg  at night after 1 week. 90 capsule 1  . isosorbide mononitrate (IMDUR) 30 MG 24 hr tablet Take 0.5 tablets (15 mg total) by mouth every morning. 45 tablet 3  . Multiple Vitamin (MULTIVITAMIN) tablet Take 1 tablet by mouth every morning.     . pantoprazole (PROTONIX) 40 MG tablet Take 1 tablet (40 mg total) by mouth every morning. 90 tablet 1  . Rivaroxaban (XARELTO) 15 MG TABS tablet Take 15 mg by mouth daily with supper.    Marland Kitchen rOPINIRole (REQUIP) 4 MG tablet Take 1 tablet (4 mg total) by mouth at bedtime. 90 tablet 1  . simvastatin (ZOCOR) 10 MG tablet Take 1 tablet (10 mg total) by mouth daily at 6 PM. 90 tablet 1  . tamsulosin (FLOMAX)  0.4 MG CAPS capsule Take 1 capsule (0.4 mg total) by mouth daily. 90 capsule 3  . tiotropium (SPIRIVA HANDIHALER) 18 MCG inhalation capsule Place 1 capsule (18 mcg total) into inhaler and inhale daily. 30 capsule 5   No facility-administered medications prior to visit.    Allergies:  Allergies  Allergen Reactions  . Amoxicillin Itching and Rash  . Penicillins Itching and Rash      Review of Systems: See HPI for pertinent ROS. All other ROS negative.    Physical Exam: Blood pressure 114/64, pulse 64, temperature 98.2 F (36.8 C), temperature source Oral, resp. rate 18, weight 155 lb (70.308 kg)., Body mass index is 22.88 kg/(m^2). General:  WNWD Elderly WM. Appears in no acute distress. Neck: Supple. No thyromegaly. No lymphadenopathy. Lungs: Clear bilaterally to auscultation without wheezes, rales, or rhonchi. Breathing is unlabored. Heart: Regular rhythm. No murmurs, rubs, or gallops. Msk:  Strength and tone normal for age. Extremities/Skin: Warm and dry.  Just Left of Midline--Just inferior to "crown" of head---- there is approx 1 inch laceration site--appears well healed, with adequate closure. 4 staples removed. Site closed. No area of opening. No bleeding. No sign of infection.  Neuro: Alert and oriented X 3. Moves all extremities spontaneously. Gait is normal. CNII-XII grossly in tact. Psych:  Responds to questions appropriately with a normal affect.     ASSESSMENT AND PLAN:  80 y.o. year old male with   1. Encounter for removal of staples  All 4 staples were removed.  Good wound closure and healing.  No sign of infection.  F/U prn.    [address] [place], [place], [facility] 07/17/2014 4:01 PM

## 2014-07-18 ENCOUNTER — Ambulatory Visit: Payer: Self-pay | Admitting: Family Medicine

## 2014-07-26 ENCOUNTER — Other Ambulatory Visit: Payer: Self-pay | Admitting: Internal Medicine

## 2014-07-28 NOTE — Telephone Encounter (Signed)
ok 

## 2014-07-29 NOTE — Telephone Encounter (Signed)
ok 

## 2014-08-04 ENCOUNTER — Telehealth: Payer: Self-pay | Admitting: Family Medicine

## 2014-08-04 NOTE — Telephone Encounter (Signed)
[phone]  [facility] [place], [place] - [address]   PT would like to have an inhaler called in

## 2014-08-07 MED ORDER — TIOTROPIUM BROMIDE MONOHYDRATE 18 MCG IN CAPS: 18.0000 ug | ORAL_CAPSULE | Freq: Every day | RESPIRATORY_TRACT | Status: DC

## 2014-08-07 NOTE — Telephone Encounter (Signed)
Called pt and he needed his spiriva refilled - med sent to [facility]

## 2014-08-14 ENCOUNTER — Encounter: Payer: Self-pay | Admitting: Family Medicine

## 2014-08-14 ENCOUNTER — Ambulatory Visit (INDEPENDENT_AMBULATORY_CARE_PROVIDER_SITE_OTHER): Payer: Medicare Other | Admitting: Family Medicine

## 2014-08-14 VITALS — BP 122/76 | HR 68 | Temp 97.6°F | Resp 16 | Ht 69.0 in | Wt 161.0 lb

## 2014-08-14 DIAGNOSIS — M25511 Pain in right shoulder: Secondary | ICD-10-CM
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
## 2014-10-09 ENCOUNTER — Ambulatory Visit: Payer: Self-pay | Admitting: General Practice

## 2014-10-09 DIAGNOSIS — Z5181 Encounter for therapeutic drug level monitoring: Secondary | ICD-10-CM

## 2014-11-11 ENCOUNTER — Telehealth: Payer: Self-pay | Admitting: Family Medicine

## 2014-11-11 MED ORDER — GABAPENTIN 100 MG PO CAPS: 200.0000 mg | ORAL_CAPSULE | Freq: Every day | ORAL | Status: DC

## 2014-11-11 NOTE — Telephone Encounter (Signed)
[facility] - Need to know what mg he is currently taking!?!

## 2014-11-11 NOTE — Telephone Encounter (Signed)
Patient needs refill on gabapentin  [facility] [place]

## 2014-11-11 NOTE — Telephone Encounter (Signed)
Med sent to pharm 

## 2014-11-13 MED ORDER — GABAPENTIN 100 MG PO CAPS: 200.0000 mg | ORAL_CAPSULE | Freq: Every day | ORAL | Status: DC

## 2014-11-13 NOTE — Telephone Encounter (Signed)
Sent rx to wrong [facility]  - it went to [facility] instead of [facility] - resent med to [facility]

## 2014-11-18 ENCOUNTER — Ambulatory Visit (INDEPENDENT_AMBULATORY_CARE_PROVIDER_SITE_OTHER): Payer: Medicare Other | Admitting: Internal Medicine

## 2014-11-18 ENCOUNTER — Encounter: Payer: Self-pay | Admitting: Internal Medicine

## 2014-11-18 VITALS — BP 132/70 | HR 64 | Ht 68.0 in | Wt 158.8 lb

## 2014-11-18 DIAGNOSIS — I714 Abdominal aortic aneurysm, without rupture, unspecified: Secondary | ICD-10-CM

## 2014-11-18 DIAGNOSIS — I4891 Unspecified atrial fibrillation: Secondary | ICD-10-CM | POA: Diagnosis not present

## 2014-11-18 LAB — MDC_IDC_ENUM_SESS_TYPE_INCLINIC
Battery Remaining Longevity: 64.8 mo
Implantable Pulse Generator Model: 2210
Implantable Pulse Generator Serial Number: 2326107
Lead Channel Impedance Value: 387.5 Ohm
Lead Channel Pacing Threshold Amplitude: 1.25 V
Lead Channel Pacing Threshold Amplitude: 1.25 V
Lead Channel Pacing Threshold Pulse Width: 0.4 ms
Lead Channel Pacing Threshold Pulse Width: 0.8 ms
Lead Channel Pacing Threshold Pulse Width: 0.8 ms
Lead Channel Sensing Intrinsic Amplitude: 0.7 mV
Lead Channel Setting Pacing Amplitude: 2 V
Lead Channel Setting Pacing Amplitude: 2.5 V
Lead Channel Setting Pacing Pulse Width: 0.8 ms
Lead Channel Setting Sensing Sensitivity: 1 mV
MDC IDC MSMT BATTERY VOLTAGE: 2.89 V
MDC IDC MSMT LEADCHNL RA PACING THRESHOLD AMPLITUDE: 0.875 V
MDC IDC MSMT LEADCHNL RV IMPEDANCE VALUE: 400 Ohm
MDC IDC MSMT LEADCHNL RV SENSING INTR AMPL: 7.3 mV
MDC IDC SESS DTM: 20160315114443
MDC IDC STAT BRADY RA PERCENT PACED: 0.01 %
MDC IDC STAT BRADY RV PERCENT PACED: 58 %

## 2014-11-18 MED ORDER — FUROSEMIDE 40 MG PO TABS: 40.0000 mg | ORAL_TABLET | Freq: Every day | ORAL | Status: DC

## 2014-11-18 NOTE — Progress Notes (Signed)
Electrophysiology Office Note   Date:  11/18/2014   ID:  [name] [name], DOB 25-Mar-1926, MRN [id number]  PCP:  Odette Fraction, MD  Cardiologist: NEW Primary Electrophysiologist:   Virl Axe, MD    Chief Complaint  Patient presents with  . other    No complaints. Meds reviewed verbally with pt.     History of Present Illness: [name] [name] is a 81 y.o. male is      Previously seen by Dr. Greggory Brandy and TS who has a pacemaker implanted for atrial fibrillation and Mobitz second-degree heart block. He has a history of coronary artery disease with prior bypass grafting. He has been managed in the past with a combination of aspirin and warfarin, but these were discontinued in favor of Rivaroxaban, not clear by whom. Renal function 11/15 had a GFR of 51  He also has a history of an abdominal aortic aneurysm with old referring to his being followed by [facility] although I don't see any visits in the last greater than 2 years. His last duplex was 11/13 at 4.5-4.6.  he moved in with his sister  and has eaten a lot better and has been taking his medications regularly    He denies symptoms of palpitations, chest pain, shortness of breath, orthopnea, PND,  , claudication, dizziness, presyncope, syncope, bleeding, or neurologic sequela. The patient is tolerating medications without difficulties  His sister is concerned about a knot on his head  He has some peripheral edema which she treats with periodic diuretics.   Past Medical History  Diagnosis Date  . CAD (coronary artery disease)   . Hypertension   . Vitamin D deficiency   . Chronic back pain   . Anemia   . ED (erectile dysfunction)   . Arthritis   . RLS (restless legs syndrome)   . GERD (gastroesophageal reflux disease)   . COPD (chronic obstructive pulmonary disease)   . Hyperlipidemia   . Atrial fibrillation   . Second degree Mobitz II AV block     s/p PPM  . Asthma 09/15/2011  . Long term current use of anticoagulant 09/15/2011    . Thrombocytopenia 09/15/2011  . Lymphocytosis   . CHF (congestive heart failure) 01/01/2013  . Allergy    Past Surgical History  Procedure Laterality Date  . Coronary artery bypass graft    . Cataract extraction    . Pacemaker insertion       Current Outpatient Prescriptions  Medication Sig Dispense Refill  . albuterol (PROVENTIL HFA;VENTOLIN HFA) 108 (90 BASE) MCG/ACT inhaler Inhale 2 puffs into the lungs every 6 (six) hours as needed. For wheezing or shortness of breath 18 g 11  . cyclobenzaprine (FLEXERIL) 5 MG tablet Take 1 tablet (5 mg total) by mouth 3 (three) times daily as needed for muscle spasms. 90 tablet 2  . diphenhydrAMINE (BENADRYL) 25 mg capsule Take 25 mg by mouth every 6 (six) hours as needed for allergies.    Marland Kitchen donepezil (ARICEPT) 5 MG tablet Take 1 tablet (5 mg total) by mouth at bedtime. 90 tablet 1  . furosemide (LASIX) 40 MG tablet Take 1 tablet (40 mg total) by mouth daily. 30 tablet 6  . gabapentin (NEURONTIN) 100 MG capsule Take 2 capsules (200 mg total) by mouth at bedtime. 90 capsule 1  . isosorbide mononitrate (IMDUR) 30 MG 24 hr tablet Take 0.5 tablets (15 mg total) by mouth every morning. 45 tablet 3  . Multiple Vitamin (MULTIVITAMIN) tablet Take 1 tablet by  mouth every morning.     . pantoprazole (PROTONIX) 40 MG tablet Take 1 tablet (40 mg total) by mouth every morning. 90 tablet 1  . Rivaroxaban (XARELTO) 15 MG TABS tablet Take 15 mg by mouth daily with supper.    Marland Kitchen rOPINIRole (REQUIP) 4 MG tablet Take 1 tablet (4 mg total) by mouth at bedtime. 90 tablet 1  . simvastatin (ZOCOR) 10 MG tablet Take 1 tablet (10 mg total) by mouth daily at 6 PM. 90 tablet 1  . tamsulosin (FLOMAX) 0.4 MG CAPS capsule TAKE ONE CAPSULE BY MOUTH ONCE DAILY 90 capsule 2  . tiotropium (SPIRIVA HANDIHALER) 18 MCG inhalation capsule Place 1 capsule (18 mcg total) into inhaler and inhale daily. 30 capsule 5   No current facility-administered medications for this visit.     Allergies:   Amoxicillin and Penicillins   Social History:  The patient  reports that he quit smoking about 6 years ago. His smoking use included Cigarettes. He has a 240 pack-year smoking history. He has never used smokeless tobacco. He reports that he drinks alcohol. He reports that he does not use illicit drugs.   Family History:  The patient's family history includes Alcohol abuse in his brother; Cancer in his brother and mother; Diabetes in his sister; Stroke in his father.    ROS:  Please see the history of present illness and past medical history  Otherwise, all other systems were reviewed and were negative .     PHYSICAL EXAM: VS:  BP 132/70 mmHg  Pulse 64  Ht 5\' 8"  (1.727 m)  Wt 158 lb 12 oz (72.009 kg)  BMI 24.14 kg/m2 , BMI Body mass index is 24.14 kg/(m^2). GEN: Well nourished, well developed, in no acute distress HEENT: normal Neck:  JVD flat, carotid bruits, or masses Cardiac: REGULAR RATE and RHYTHM ; 2/6  murmurs, rubs, +  S4  Back without kyphosis; No CVAT Respiratory:  clear to auscultation bilaterally, normal work of breathing GI: soft, nontender, nondistended, + BS MS: no deformity or atrophy Extremities no clubbing cyanosis 2+  edema Skin: warm and dry,  device pocket is well healed without teathering Neuro:  Strength and sensation are intact Psych: euthymic mood, full affect  EKG:  EKG is ordered today. The ekg ordered today shows Afib with V pacing  Device interrogation is reviewed today in detail.  See PaceArt for details also order CBC plays  Recent Labs: 07/08/2014: ALT <8; BUN 13; Creatinine 1.27; Hemoglobin 11.7*; Platelets 93*; Potassium 4.5; Sodium 142; TSH 1.358    Lipid Panel     Component Value Date/Time   CHOL 108 07/08/2014 0804   TRIG 57 07/08/2014 0804   HDL 44 07/08/2014 0804   CHOLHDL 2.5 07/08/2014 0804   VLDL 11 07/08/2014 0804   LDLCALC 53 07/08/2014 0804     Wt Readings from Last 3 Encounters:  11/18/14 158 lb 12 oz  (72.009 kg)  08/14/14 161 lb (73.029 kg)  07/17/14 155 lb (70.308 kg)      Other studies Reviewed: Additional studies/ records that were reviewed today include:old notes and imaging studies As above      ASSESSMENT AND PLAN:  ATrial fibrillation  CAD  S/p CABG  Bradycardia  Pacemaker [employer]  Thrombocytopenia  Abdominal aortic aneurysm  Congestive Heart failure  Syncope  The patient has permanent atrial fibrillation and is on Rivaroxaban.  Appropriate dosing is a functional renal function and we will clarify that today  He has a history  of thrombocytopenia. This will also need follow-up.  He has an abdominal aortic aneurysm of significant proportion last assessed in 2013. We will recheck it. We will forward the results of that to [facility]  Device function is normal with significant ventricular pacing  His congestive heart failure is notable for peripheral edema and jugular venous distention. We will increase his Lasix from 20-40 mg a day. We will need repeat assessment of his metabolic profile in about 2 weeks time, we will check his CBC then.    Current medicines are reviewed at length with the patient today.   The patient does not have concerns regarding his medicines.  The following changes were made today:  none  Labs/ tests ordered today include:    Orders Placed This Encounter  Procedures  . Basic metabolic panel  . Implantable device check  . EKG 12-Lead  . Abdominal Aortic Aneurysm duplex     Disposition:   FU with me   1 year(s)  Signed, Virl Axe, MD  11/18/2014 12:55 PM     [place] [address] [phone] (office) [phone] (fax)

## 2014-11-18 NOTE — Patient Instructions (Addendum)
Your physician has recommended you make the following change in your medication:  Increase Lasix to 40 mg once daily   Your physician recommends that you return for lab work in:  [facility] today   Your physician has requested that you have an abdominal aorta duplex. During this test, an ultrasound is used to evaluate the aorta. Allow 30 minutes for this exam. Do not eat after midnight the day before and avoid carbonated beverages   Your physician wants you to follow-up in: 1 year. You will receive a reminder letter in the mail two months in advance. If you don't receive a letter, please call our office to schedule the follow-up appointment.  Remote monitoring is used to monitor your Pacemaker of ICD from home. This monitoring reduces the number of office visits required to check your device to one time per year. It allows [place] to keep an eye on the functioning of your device to ensure it is working properly. You are scheduled for a device check from home on 02/17/15. You may send your transmission at any time that day. If you have a wireless device, the transmission will be sent automatically. After your physician reviews your transmission, you will receive a postcard with your next transmission date.

## 2014-11-19 LAB — BASIC METABOLIC PANEL
BUN/Creatinine Ratio: 7 — ABNORMAL LOW (ref 10–22)
BUN: 10 mg/dL (ref 8–27)
CHLORIDE: 105 mmol/L (ref 97–108)
CO2: 25 mmol/L (ref 18–29)
CREATININE: 1.35 mg/dL — AB (ref 0.76–1.27)
Calcium: 9 mg/dL (ref 8.6–10.2)
GFR calc Af Amer: 54 mL/min/{1.73_m2} — ABNORMAL LOW (ref >59)
GFR calc non Af Amer: 47 mL/min/{1.73_m2} — ABNORMAL LOW (ref >59)
Glucose: 118 mg/dL — ABNORMAL HIGH (ref 65–99)
Potassium: 3.8 mmol/L (ref 3.5–5.2)
Sodium: 146 mmol/L — ABNORMAL HIGH (ref 134–144)

## 2014-11-28 ENCOUNTER — Encounter: Payer: Self-pay | Admitting: Family Medicine

## 2014-12-02 ENCOUNTER — Telehealth: Payer: Self-pay | Admitting: *Deleted

## 2014-12-02 ENCOUNTER — Ambulatory Visit: Payer: Medicare Other | Admitting: Family Medicine

## 2014-12-02 NOTE — Telephone Encounter (Signed)
LVM to inform patient Dr. Caryl Comes would like him to have cbc and bmp on or around 12/10/2014

## 2014-12-12 ENCOUNTER — Encounter (INDEPENDENT_AMBULATORY_CARE_PROVIDER_SITE_OTHER): Payer: Medicare Other

## 2014-12-12 DIAGNOSIS — I714 Abdominal aortic aneurysm, without rupture, unspecified: Secondary | ICD-10-CM

## 2015-01-02 ENCOUNTER — Telehealth: Payer: Self-pay | Admitting: Internal Medicine

## 2015-01-02 NOTE — Telephone Encounter (Signed)
Notes Recorded by Antonieta Iba, RN on 01/02/2015 at 10:07 AM Patient notified of abdominal aortic aneurysm duplex study results - "abnormality stable, repeat scan in 6 mo and reestablish with [facility]", per Dr. Caryl Comes. Patient's sister [facility]) verbalized understanding and appreciation for phone call. Denies further questions or concerns.

## 2015-01-02 NOTE — Telephone Encounter (Signed)
New message ° ° ° ° ° °Returning a nurses call to get test results °

## 2015-01-05 ENCOUNTER — Telehealth: Payer: Self-pay

## 2015-01-05 DIAGNOSIS — I25709 Atherosclerosis of coronary artery bypass graft(s), unspecified, with unspecified angina pectoris: Secondary | ICD-10-CM

## 2015-01-05 DIAGNOSIS — I480 Paroxysmal atrial fibrillation: Secondary | ICD-10-CM

## 2015-01-05 MED ORDER — RIVAROXABAN 15 MG PO TABS: 15.0000 mg | ORAL_TABLET | Freq: Every day | ORAL | Status: DC

## 2015-01-05 MED ORDER — ISOSORBIDE MONONITRATE ER 30 MG PO TB24: 15.0000 mg | ORAL_TABLET | Freq: Every morning | ORAL | Status: DC

## 2015-01-05 NOTE — Telephone Encounter (Signed)
See BMET regarding xarelto

## 2015-01-07 ENCOUNTER — Other Ambulatory Visit: Payer: Self-pay | Admitting: Family Medicine

## 2015-01-07 MED ORDER — DONEPEZIL HCL 5 MG PO TABS: 5.0000 mg | ORAL_TABLET | Freq: Every day | ORAL | Status: DC

## 2015-01-07 MED ORDER — ROPINIROLE HCL 4 MG PO TABS: 4.0000 mg | ORAL_TABLET | Freq: Every day | ORAL | Status: DC

## 2015-01-09 ENCOUNTER — Other Ambulatory Visit: Payer: Self-pay | Admitting: Family Medicine

## 2015-01-09 MED ORDER — SIMVASTATIN 10 MG PO TABS: 10.0000 mg | ORAL_TABLET | Freq: Every day | ORAL | Status: DC

## 2015-01-09 MED ORDER — ROPINIROLE HCL 4 MG PO TABS: 4.0000 mg | ORAL_TABLET | Freq: Every day | ORAL | Status: DC

## 2015-01-09 MED ORDER — PANTOPRAZOLE SODIUM 40 MG PO TBEC: 40.0000 mg | DELAYED_RELEASE_TABLET | Freq: Every morning | ORAL | Status: DC

## 2015-01-09 MED ORDER — DONEPEZIL HCL 5 MG PO TABS: 5.0000 mg | ORAL_TABLET | Freq: Every day | ORAL | Status: DC

## 2015-01-12 ENCOUNTER — Encounter: Payer: Self-pay | Admitting: Emergency Medicine

## 2015-01-12 ENCOUNTER — Emergency Department: Payer: Medicare Other

## 2015-01-12 ENCOUNTER — Emergency Department
Admission: EM | Admit: 2015-01-12 | Discharge: 2015-01-12 | Disposition: A | Discharge status: Home / Self Care | Payer: Medicare Other | Attending: Emergency Medicine | Admitting: Emergency Medicine

## 2015-01-12 ENCOUNTER — Other Ambulatory Visit: Payer: Self-pay

## 2015-01-12 ENCOUNTER — Telehealth: Payer: Self-pay

## 2015-01-12 DIAGNOSIS — Z79899 Other long term (current) drug therapy: Secondary | ICD-10-CM | POA: Insufficient documentation

## 2015-01-12 DIAGNOSIS — Z880 Allergy status to penicillin: Secondary | ICD-10-CM | POA: Insufficient documentation

## 2015-01-12 DIAGNOSIS — D72829 Elevated white blood cell count, unspecified: Secondary | ICD-10-CM | POA: Diagnosis not present

## 2015-01-12 DIAGNOSIS — R079 Chest pain, unspecified: Secondary | ICD-10-CM | POA: Diagnosis not present

## 2015-01-12 DIAGNOSIS — Z91040 Latex allergy status: Secondary | ICD-10-CM | POA: Insufficient documentation

## 2015-01-12 DIAGNOSIS — I10 Essential (primary) hypertension: Secondary | ICD-10-CM | POA: Diagnosis not present

## 2015-01-12 LAB — BASIC METABOLIC PANEL
Anion gap: 6 (ref 5–15)
BUN: 22 mg/dL — AB (ref 6–20)
CHLORIDE: 102 mmol/L (ref 101–111)
CO2: 32 mmol/L (ref 22–32)
Calcium: 9.2 mg/dL (ref 8.9–10.3)
Creatinine, Ser: 1.38 mg/dL — ABNORMAL HIGH (ref 0.61–1.24)
GFR calc Af Amer: 51 mL/min — ABNORMAL LOW (ref >60)
GFR calc non Af Amer: 44 mL/min — ABNORMAL LOW (ref >60)
GLUCOSE: 120 mg/dL — AB (ref 65–99)
Potassium: 3.9 mmol/L (ref 3.5–5.1)
SODIUM: 140 mmol/L (ref 135–145)

## 2015-01-12 LAB — CBC
HCT: 36.8 % — ABNORMAL LOW (ref 40.0–52.0)
Hemoglobin: 11.8 g/dL — ABNORMAL LOW (ref 13.0–18.0)
MCH: 29.7 pg (ref 26.0–34.0)
MCHC: 32.2 g/dL (ref 32.0–36.0)
MCV: 92.2 fL (ref 80.0–100.0)
Platelets: 84 10*3/uL — ABNORMAL LOW (ref 150–440)
RBC: 3.99 MIL/uL — ABNORMAL LOW (ref 4.40–5.90)
RDW: 14.5 % (ref 11.5–14.5)
WBC: 19.8 10*3/uL — ABNORMAL HIGH (ref 3.8–10.6)

## 2015-01-12 LAB — TROPONIN I: Troponin I: 0.03 ng/mL (ref <0.031)

## 2015-01-12 MED ORDER — IPRATROPIUM-ALBUTEROL 0.5-2.5 (3) MG/3ML IN SOLN
3.0000 mL | Freq: Once | RESPIRATORY_TRACT | Status: AC
  Administered 2015-01-12: 3 mL via RESPIRATORY_TRACT

## 2015-01-12 MED ORDER — ALBUTEROL SULFATE HFA 108 (90 BASE) MCG/ACT IN AERS: 2.0000 | INHALATION_SPRAY | Freq: Four times a day (QID) | RESPIRATORY_TRACT | Status: DC | PRN

## 2015-01-12 MED ORDER — FLONASE 50 MCG/ACT NA SUSP: 1.0000 | Freq: Every day | NASAL | Status: DC

## 2015-01-12 MED ORDER — IPRATROPIUM-ALBUTEROL 0.5-2.5 (3) MG/3ML IN SOLN
RESPIRATORY_TRACT | Status: AC
Start: 2015-01-12 — End: 2015-01-12
  Administered 2015-01-12: 3 mL via RESPIRATORY_TRACT
  Filled 2015-01-12: qty 3

## 2015-01-12 NOTE — ED Notes (Signed)
Patient c/o right sided chest pain for several days. No obvious distress at this time. Hx CABG, former smoker, pacemaker.

## 2015-01-12 NOTE — Discharge Instructions (Signed)
Chest Wall Pain Chest wall pain is pain in or around the bones and muscles of your chest. It may take up to 6 weeks to get better. It may take longer if you must stay physically active in your work and activities.  CAUSES  Chest wall pain may happen on its own. However, it may be caused by:  A viral illness like the flu.  Injury.  Coughing.  Exercise.  Arthritis.  Fibromyalgia.  Shingles. HOME CARE INSTRUCTIONS   Avoid overtiring physical activity. Try not to strain or perform activities that cause pain. This includes any activities using your chest or your abdominal and side muscles, especially if heavy weights are used.  Put ice on the sore area.  Put ice in a plastic bag.  Place a towel between your skin and the bag.  Leave the ice on for 15-20 minutes per hour while awake for the first 2 days.  Only take over-the-counter or prescription medicines for pain, discomfort, or fever as directed by your caregiver. SEEK IMMEDIATE MEDICAL CARE IF:   Your pain increases, or you are very uncomfortable.  You have a fever.  Your chest pain becomes worse.  You have new, unexplained symptoms.  You have nausea or vomiting.  You feel sweaty or lightheaded.  You have a cough with phlegm (sputum), or you cough up blood. MAKE SURE YOU:   Understand these instructions.  Will watch your condition.  Will get help right away if you are not doing well or get worse. Document Released: 08/22/2005 Document Revised: 11/14/2011 Document Reviewed: 04/18/2011 [facility] Patient Information 2015 [place], [place]. This information is not intended to replace advice given to you by your health care provider. Make sure you discuss any questions you have with your health care provider. Leukocytosis Leukocytosis means you have more white blood cells than normal. White blood cells are made in your bone marrow. The main job of white blood cells is to fight infection. Having too many white blood cells  is a common condition. It can develop as a result of many types of medical problems. CAUSES  In some cases, your bone marrow may be normal, but it is still making too many white blood cells. This could be the result of:  Infection.  Injury.  Physical stress.  Emotional stress.  Surgery.  Allergic reactions.  Tumors that do not start in the blood or bone marrow.  An inherited disease.  Certain medicines.  Pregnancy and labor. In other cases, you may have a bone marrow disorder that is causing your body to make too many white blood cells. Bone marrow disorders include:  Leukemia. This is a type of blood cancer.  Myeloproliferative disorders. These disorders cause blood cells to grow abnormally. SYMPTOMS  Some people have no symptoms. Others have symptoms due to the medical problem that is causing their leukocytosis. These symptoms may include:  Bleeding.  Bruising.  Fever.  Night sweats.  Repeated infections.  Weakness.  Weight loss. DIAGNOSIS  Leukocytosis is often found during blood tests that are done as part of a normal physical exam. Your caregiver will probably order other tests to help determine why you have too many white blood cells. These tests may include:  A complete blood count (CBC). This test measures all the types of blood cells in your body.  Chest X-rays, urine tests (urinalysis), or other tests to look for signs of infection.  Bone marrow aspiration. For this test, a needle is put into your bone. Cells from  the bone marrow are removed through the needle. The cells are then examined under a microscope. TREATMENT  Treatment is usually not needed for leukocytosis. However, if a disorder is causing your leukocytosis, it will need to be treated. Treatment may include:  Antibiotic medicines if you have a bacterial infection.  Bone marrow transplant. Your diseased bone marrow is replaced with healthy cells that will grow new bone  marrow.  Chemotherapy. This is the use of drugs to kill cancer cells. HOME CARE INSTRUCTIONS  Only take over-the-counter or prescription medicines as directed by your caregiver.  Maintain a healthy weight. Ask your caregiver what weight is best for you.  Eat foods that are low in saturated fats and high in fiber. Eat plenty of fruits and vegetables.  Drink enough fluids to keep your urine clear or pale yellow.  Get 30 minutes of exercise at least 5 times a week. Check with your caregiver before starting a new exercise routine.  Limit caffeine and alcohol.  Do not smoke.  Keep all follow-up appointments as directed by your caregiver. SEEK MEDICAL CARE IF:  You feel weak or more tired than usual.  You develop chills, a cough, or nasal congestion.  You lose weight without trying.  You have night sweats.  You bruise easily. SEEK IMMEDIATE MEDICAL CARE IF:  You bleed more than normal.  You have chest pain.  You have trouble breathing.  You have a fever.  You have uncontrolled nausea or vomiting.  You feel dizzy or lightheaded. MAKE SURE YOU:  Understand these instructions.  Will watch your condition.  Will get help right away if you are not doing well or get worse. Document Released: 08/11/2011 Document Revised: 11/14/2011 Document Reviewed: 08/11/2011 [facility] Patient Information 2015 [place], [place]. This information is not intended to replace advice given to you by your health care provider. Make sure you discuss any questions you have with your health care provider.

## 2015-01-12 NOTE — ED Notes (Signed)
D/c instructions reviewed w/ pt - pt denies any further questions or concerns at present.

## 2015-01-12 NOTE — ED Notes (Signed)
Patient resting in stretcher. Respirations even and unlabored. No obvious distress. Cardiac monitor in place. No needs/concerns verbalized at this time. Family at bedside. Call bell within reach. Encouraged to call with needs. Will continue to monitor.

## 2015-01-12 NOTE — Telephone Encounter (Signed)
Pt presented to the office w/ his wife asking to see Dr. Caryl Comes. Spoke w/ them in the waiting room, primarily w/ wife, as pt was grooming his nails and would not speak.  Wife states that pt has had swelling and pain in both breasts, as well as pain in his left forearm.  She denies any SOB or other swelling or pain.  She states that she thinks he hit his arm on something, that she "doctored on it and it's feeling better, but his boobies are still swollen".  Advised her that Dr. Caryl Comes is not in the office, but offered to assess pt's pacer site.  Pt and wife state that his issue is not r/t pacer, but pt's PCP is in [place] and too far away, so they presented to our office.  After some discussion, advised them to contact pt's PCP, but they prefer to go to Urgent Care here at [facility]. Asked them to call back if we can be of further assistance.

## 2015-01-12 NOTE — ED Provider Notes (Signed)
[facility] Emergency Department Provider Note    Time seen: 11:19 AM  I have reviewed the triage vital signs and the nursing notes.   HISTORY  Chief Complaint Chest Pain    HPI [name] [name] is a 81 y.o. male who presents to ED for right-sided chest pain for several days. Family states he lays all day long. Denies any recent trauma. Has had a cough, has COPD. Pain is sharp right-sided mild movement or pressure in their makes it worse. No other associated symptoms.     Past Medical History  Diagnosis Date  . CAD (coronary artery disease)   . Hypertension   . Vitamin D deficiency   . Chronic back pain   . Anemia   . ED (erectile dysfunction)   . Arthritis   . RLS (restless legs syndrome)   . GERD (gastroesophageal reflux disease)   . COPD (chronic obstructive pulmonary disease)   . Hyperlipidemia   . Atrial fibrillation   . Second degree Mobitz II AV block     s/p PPM  . Asthma 09/15/2011  . Long term current use of anticoagulant 09/15/2011  . Thrombocytopenia 09/15/2011  . Lymphocytosis   . CHF (congestive heart failure) 01/01/2013  . Allergy     Patient Active Problem List   Diagnosis Date Noted  . Encounter for therapeutic drug monitoring 10/09/2013  . Rotator cuff tear arthropathy of right shoulder 09/04/2013  . Back pain, acute 06/14/2013  . Right shoulder pain 05/03/2013  . Chronic pain in left shoulder 05/03/2013  . Abnormal CT scan, chest 01/10/2013  . Anticoagulated on Coumadin 01/01/2013  . Left shoulder pain 01/01/2013  . CHF (congestive heart failure) 01/01/2013  . Permanent atrial fibrillation 11/02/2012  . Lymphocytosis   . Balanitis 09/11/2012  . UTI (lower urinary tract infection) 09/05/2012  . Shortness of breath 09/05/2012  . Altered mental status 09/05/2012  . Dementia 08/14/2012  . Acute low back pain 07/02/2012  . Cellulitis of right leg 05/28/2012  . Hematoma 05/17/2012  . Leg pain 05/17/2012  .  Pacemaker-St.Jude 04/26/2012  . Eczema 03/14/2012  . Right carotid bruit 01/06/2012  . Long term current use of anticoagulant 09/15/2011  . Thrombocytopenia 09/15/2011  . Preventative health care 09/10/2011  . CONSTIPATION 06/01/2010  . CLOSED DISLOCATION OF ACROMIOCLAVICULAR 06/01/2010  . Abdominal aortic aneurysm 01/28/2010  . COSTOCHONDRITIS 10/22/2009  . Cholesterolosis of gallbladder 07/08/2009  . CHOLELITHIASIS 06/30/2009  . MOBITZ II ATRIOVENTRICULAR BLOCK 06/17/2009  . ATRIAL FIBRILLATION, PAROXYSMAL 06/17/2009  . CAD 01/16/2009  . HYPERTENSION, BENIGN 12/31/2008  . BACK PAIN, CHRONIC 12/31/2008  . Edema 12/31/2008  . HYPOTHYROIDISM 11/05/2008  . VITAMIN D DEFICIENCY 11/05/2008  . UNS ADVRS EFF OTH RX MEDICINAL&BIOLOGICAL SBSTNC 11/05/2008  . ERECTILE DYSFUNCTION 09/02/2008  . ARTHRITIS, HIP 09/02/2008  . RESTLESS LEG SYNDROME 12/27/2007  . INSOMNIA 12/27/2007  . HYPERLIPIDEMIA 08/15/2007  . CORONARY ATHEROSCLEROSIS, ARTERY BYPASS GRAFT 08/15/2007  . COPD 08/15/2007  . GERD 08/15/2007  . FREQUENCY, URINARY 08/15/2007  . BPH (benign prostatic hypertrophy) 08/15/2007    Past Surgical History  Procedure Laterality Date  . Coronary artery bypass graft    . Cataract extraction    . Pacemaker insertion      Current Outpatient Rx  Name  Route  Sig  Dispense  Refill  . albuterol (PROVENTIL HFA;VENTOLIN HFA) 108 (90 BASE) MCG/ACT inhaler   Inhalation   Inhale 2 puffs into the lungs every 6 (six) hours as needed. For wheezing or shortness  of breath   18 g   11   . cyclobenzaprine (FLEXERIL) 5 MG tablet   Oral   Take 1 tablet (5 mg total) by mouth 3 (three) times daily as needed for muscle spasms.   90 tablet   2   . diphenhydrAMINE (BENADRYL) 25 mg capsule   Oral   Take 25 mg by mouth every 6 (six) hours as needed for allergies.         Marland Kitchen donepezil (ARICEPT) 5 MG tablet   Oral   Take 1 tablet (5 mg total) by mouth at bedtime.   90 tablet   1   .  furosemide (LASIX) 40 MG tablet   Oral   Take 1 tablet (40 mg total) by mouth daily.   30 tablet   6   . gabapentin (NEURONTIN) 100 MG capsule   Oral   Take 2 capsules (200 mg total) by mouth at bedtime.   90 capsule   1   . isosorbide mononitrate (IMDUR) 30 MG 24 hr tablet   Oral   Take 0.5 tablets (15 mg total) by mouth every morning.   45 tablet   3   . Multiple Vitamin (MULTIVITAMIN) tablet   Oral   Take 1 tablet by mouth every morning.          . pantoprazole (PROTONIX) 40 MG tablet   Oral   Take 1 tablet (40 mg total) by mouth every morning.   90 tablet   1   . Rivaroxaban (XARELTO) 15 MG TABS tablet   Oral   Take 1 tablet (15 mg total) by mouth daily with supper.   30 tablet   6   . rOPINIRole (REQUIP) 4 MG tablet   Oral   Take 1 tablet (4 mg total) by mouth at bedtime.   90 tablet   1   . simvastatin (ZOCOR) 10 MG tablet   Oral   Take 1 tablet (10 mg total) by mouth daily at 6 PM.   90 tablet   1   . tamsulosin (FLOMAX) 0.4 MG CAPS capsule      TAKE ONE CAPSULE BY MOUTH ONCE DAILY   90 capsule   2   . tiotropium (SPIRIVA HANDIHALER) 18 MCG inhalation capsule   Inhalation   Place 1 capsule (18 mcg total) into inhaler and inhale daily.   30 capsule   5     Allergies Amoxicillin and Penicillins  Family History  Problem Relation Age of Onset  . Diabetes Sister   . Cancer Mother     lung cancer  . Stroke Father   . Cancer Brother     brain cancer  . Alcohol abuse Brother     Social History History  Substance Use Topics  . Smoking status: Former Smoker -- 3.00 packs/day for 80 years    Types: Cigarettes    Quit date: 09/05/2008  . Smokeless tobacco: Never Used  . Alcohol Use: Yes     Comment: occasional 4 drinks per week    Review of Systems Constitutional: Negative for fever. Eyes: Negative for visual changes. ENT: Negative for sore throat. Cardiovascular: Negative for chest pain. Respiratory: Negative for shortness of  breath. Gastrointestinal: Negative for abdominal pain, vomiting and diarrhea. Genitourinary: Negative for dysuria. Musculoskeletal: Negative for back pain. Skin: Negative for rash. Neurological: Negative for headaches, focal weakness or numbness.  10-point ROS otherwise negative.  ____________________________________________   PHYSICAL EXAM:  VITAL SIGNS: ED Triage Vitals  Enc Vitals  Group     BP 01/12/15 1101 123/76 mmHg     Pulse Rate 01/12/15 1101 62     Resp 01/12/15 1101 18     Temp 01/12/15 1101 97.8 F (36.6 C)     Temp Source 01/12/15 1101 Oral     SpO2 01/12/15 1101 100 %     Weight 01/12/15 1101 159 lb 9.8 oz (72.4 kg)     Height 01/12/15 1101 5\' 9"  (1.753 m)     Head Cir --      Peak Flow --      Pain Score 01/12/15 1103 3     Pain Loc --      Pain Edu? --      Excl. in [place]? --     Constitutional: Alert and oriented. Well appearing and in no distress. Eyes: Conjunctivae are normal. PERRL. Normal extraocular movements. ENT   Head: Normocephalic and atraumatic.   Nose: No congestion/rhinnorhea.   Mouth/Throat: Mucous membranes are moist.   Neck: No stridor. Hematological/Lymphatic/Immunilogical: No cervical lymphadenopathy. Cardiovascular: Normal rate, regular rhythm. Normal and symmetric distal pulses are present in all extremities. No murmurs, rubs, or gallops. Respiratory: Normal respiratory effort without tachypnea nor retractions. Breath sounds are clear and equal bilaterally. No wheezes/rales/rhonchi. Gastrointestinal: Soft and nontender. No distention. No abdominal bruits. There is no CVA tenderness. Musculoskeletal: Nontender with normal range of motion in all extremities. No joint effusions.  No lower extremity tenderness nor edema. Neurologic:  Normal speech and language. No gross focal neurologic deficits are appreciated. Speech is normal. No gait instability. Skin:  Skin is warm, dry and intact. No rash noted. Psychiatric: Mood and  affect are normal. Speech and behavior are normal. Patient exhibits appropriate insight and judgment.  ____________________________________________    LABS (pertinent positives/negatives)  Labs Reviewed  CBC - Abnormal; Notable for the following:    WBC 19.8 (*)    RBC 3.99 (*)    Hemoglobin 11.8 (*)    HCT 36.8 (*)    Platelets 84 (*)    All other components within normal limits  BASIC METABOLIC PANEL - Abnormal; Notable for the following:    Glucose, Bld 120 (*)    BUN 22 (*)    Creatinine, Ser 1.38 (*)    GFR calc non Af Amer 44 (*)    GFR calc Af Amer 51 (*)    All other components within normal limits  TROPONIN I     ____________________________________________  EKG: Ventricular pacemaker with a rate of 59. No acute events identified  RADIOLOGY  Chest x-ray is normal  ____________________________________________    ED COURSE  Pertinent labs & imaging results that were available during my care of the patient were reviewed by me and considered in my medical decision making (see chart for details).  Cardiac labs were checked. Chest x-ray was also ordered  FINAL ASSESSMENT AND PLAN  Assessment: Chest pain right sided, leukocytosis  Plan: Patient is about half White blood cell count rechecked next week. He denies any fever or any other symptoms of acute illness. Right-sided chest pain appears to be chest wall related. He is in no acute distress denies complaint currently stable for discharge. His primary care doctor.    Earleen Newport, MD   Earleen Newport, MD 01/12/15 [phone]

## 2015-01-15 ENCOUNTER — Encounter: Payer: Self-pay | Admitting: Family Medicine

## 2015-01-15 ENCOUNTER — Ambulatory Visit (INDEPENDENT_AMBULATORY_CARE_PROVIDER_SITE_OTHER): Payer: Medicare Other | Admitting: Family Medicine

## 2015-01-15 ENCOUNTER — Other Ambulatory Visit: Payer: Self-pay | Admitting: Family Medicine

## 2015-01-15 VITALS — BP 110/62 | HR 64 | Temp 97.5°F | Resp 20 | Wt 154.0 lb

## 2015-01-15 DIAGNOSIS — D72829 Elevated white blood cell count, unspecified: Secondary | ICD-10-CM | POA: Diagnosis not present

## 2015-01-15 DIAGNOSIS — R079 Chest pain, unspecified: Secondary | ICD-10-CM

## 2015-01-15 LAB — URINALYSIS, MICROSCOPIC ONLY
Casts: NONE SEEN
Crystals: NONE SEEN

## 2015-01-15 LAB — URINALYSIS, ROUTINE W REFLEX MICROSCOPIC
BILIRUBIN URINE: NEGATIVE
Glucose, UA: NEGATIVE mg/dL
Ketones, ur: NEGATIVE mg/dL
Leukocytes, UA: NEGATIVE
Nitrite: NEGATIVE
Protein, ur: NEGATIVE mg/dL
SPECIFIC GRAVITY, URINE: 1.025 (ref 1.005–1.030)
Urobilinogen, UA: 0.2 mg/dL (ref 0.0–1.0)
pH: 5.5 (ref 5.0–8.0)

## 2015-01-16 ENCOUNTER — Encounter: Payer: Self-pay | Admitting: Family Medicine

## 2015-01-16 LAB — COMPLETE METABOLIC PANEL WITH GFR
ALT: 12 U/L (ref 0–53)
AST: 20 U/L (ref 0–37)
Albumin: 4.3 g/dL (ref 3.5–5.2)
Alkaline Phosphatase: 76 U/L (ref 39–117)
BUN: 18 mg/dL (ref 6–23)
CO2: 28 mEq/L (ref 19–32)
CREATININE: 1.33 mg/dL (ref 0.50–1.35)
Calcium: 9.6 mg/dL (ref 8.4–10.5)
Chloride: 100 mEq/L (ref 96–112)
GFR, EST AFRICAN AMERICAN: 55 mL/min — AB
GFR, Est Non African American: 48 mL/min — ABNORMAL LOW
Glucose, Bld: 92 mg/dL (ref 70–99)
POTASSIUM: 4 meq/L (ref 3.5–5.3)
SODIUM: 140 meq/L (ref 135–145)
TOTAL PROTEIN: 6.8 g/dL (ref 6.0–8.3)
Total Bilirubin: 0.7 mg/dL (ref 0.2–1.2)

## 2015-01-16 LAB — CBC WITH DIFFERENTIAL/PLATELET
BASOS PCT: 0 % (ref 0–1)
Basophils Absolute: 0 10*3/uL (ref 0.0–0.1)
Eosinophils Absolute: 0.2 10*3/uL (ref 0.0–0.7)
Eosinophils Relative: 1 % (ref 0–5)
HCT: 39 % (ref 39.0–52.0)
HEMOGLOBIN: 12.5 g/dL — AB (ref 13.0–17.0)
Lymphocytes Relative: 79 % — ABNORMAL HIGH (ref 12–46)
Lymphs Abs: 14.2 10*3/uL — ABNORMAL HIGH (ref 0.7–4.0)
MCH: 29.3 pg (ref 26.0–34.0)
MCHC: 32.1 g/dL (ref 30.0–36.0)
MCV: 91.5 fL (ref 78.0–100.0)
MONO ABS: 0.4 10*3/uL (ref 0.1–1.0)
MPV: 11.3 fL (ref 8.6–12.4)
Monocytes Relative: 2 % — ABNORMAL LOW (ref 3–12)
NEUTROS ABS: 3.2 10*3/uL (ref 1.7–7.7)
Neutrophils Relative %: 18 % — ABNORMAL LOW (ref 43–77)
Platelets: 103 10*3/uL — ABNORMAL LOW (ref 150–400)
RBC: 4.26 MIL/uL (ref 4.22–5.81)
RDW: 14.5 % (ref 11.5–15.5)
WBC: 18 10*3/uL — ABNORMAL HIGH (ref 4.0–10.5)

## 2015-01-16 LAB — SEDIMENTATION RATE: Sed Rate: 4 mm/hr (ref 0–20)

## 2015-01-16 LAB — PATHOLOGIST SMEAR REVIEW

## 2015-01-16 NOTE — Progress Notes (Signed)
Subjective:    Patient ID: [name] [name], male    DOB: 06-24-1926, 81 y.o.   MRN: [id number]  HPI Patient went to the emergency room recently with chest pain in the right side of his chest and swelling in his left arm. The pain is constant. He describes it as a soreness. The soreness is located near and slightly above his right nipple. He denies any shortness of breath beyond his baseline COPD. He denies any angina. He denies any nausea or vomiting. Cardiac enzymes and a chest x-ray were obtained in the emergency room and were normal. However a CBC revealed an elevated white blood cell count of 19. He was instructed to follow-up here to determine the cause of his elevated white blood cell count area and the last time I checked a CBC was in November and at that time the patient's white blood cell count was slightly over 13. He denies any fevers or chills. He denies any dysuria. He denies any nausea vomiting or diarrhea. He denies any new cough, or cough productive of purulent sputum. He denies any symptoms of illness. Past Medical History  Diagnosis Date  . CAD (coronary artery disease)   . Hypertension   . Vitamin D deficiency   . Chronic back pain   . Anemia   . ED (erectile dysfunction)   . Arthritis   . RLS (restless legs syndrome)   . GERD (gastroesophageal reflux disease)   . COPD (chronic obstructive pulmonary disease)   . Hyperlipidemia   . Atrial fibrillation   . Second degree Mobitz II AV block     s/p PPM  . Asthma 09/15/2011  . Long term current use of anticoagulant 09/15/2011  . Thrombocytopenia 09/15/2011  . Lymphocytosis   . CHF (congestive heart failure) 01/01/2013  . Allergy    Past Surgical History  Procedure Laterality Date  . Coronary artery bypass graft    . Cataract extraction    . Pacemaker insertion     Current Outpatient Prescriptions on File Prior to Visit  Medication Sig Dispense Refill  . albuterol (PROVENTIL HFA;VENTOLIN HFA) 108 (90 BASE) MCG/ACT  inhaler Inhale 2 puffs into the lungs every 6 (six) hours as needed. For wheezing or shortness of breath 18 g 11  . albuterol (PROVENTIL HFA;VENTOLIN HFA) 108 (90 BASE) MCG/ACT inhaler Inhale 2 puffs into the lungs every 6 (six) hours as needed for wheezing or shortness of breath. 1 Inhaler 2  . cyclobenzaprine (FLEXERIL) 5 MG tablet Take 1 tablet (5 mg total) by mouth 3 (three) times daily as needed for muscle spasms. 90 tablet 2  . diphenhydrAMINE (BENADRYL) 25 mg capsule Take 25 mg by mouth every 6 (six) hours as needed for allergies.    Marland Kitchen donepezil (ARICEPT) 5 MG tablet Take 1 tablet (5 mg total) by mouth at bedtime. 90 tablet 1  . FLONASE 50 MCG/ACT nasal spray Place 1 spray into both nostrils daily. 1 g 2  . furosemide (LASIX) 40 MG tablet Take 1 tablet (40 mg total) by mouth daily. 30 tablet 6  . gabapentin (NEURONTIN) 100 MG capsule Take 2 capsules (200 mg total) by mouth at bedtime. 90 capsule 1  . isosorbide mononitrate (IMDUR) 30 MG 24 hr tablet Take 0.5 tablets (15 mg total) by mouth every morning. 45 tablet 3  . Multiple Vitamin (MULTIVITAMIN) tablet Take 1 tablet by mouth every morning.     . pantoprazole (PROTONIX) 40 MG tablet Take 1 tablet (40 mg total) by mouth  every morning. 90 tablet 1  . Rivaroxaban (XARELTO) 15 MG TABS tablet Take 1 tablet (15 mg total) by mouth daily with supper. 30 tablet 6  . rOPINIRole (REQUIP) 4 MG tablet Take 1 tablet (4 mg total) by mouth at bedtime. 90 tablet 1  . simvastatin (ZOCOR) 10 MG tablet Take 1 tablet (10 mg total) by mouth daily at 6 PM. 90 tablet 1  . tamsulosin (FLOMAX) 0.4 MG CAPS capsule TAKE ONE CAPSULE BY MOUTH ONCE DAILY 90 capsule 2  . tiotropium (SPIRIVA HANDIHALER) 18 MCG inhalation capsule Place 1 capsule (18 mcg total) into inhaler and inhale daily. 30 capsule 5   No current facility-administered medications on file prior to visit.   Allergies  Allergen Reactions  . Amoxicillin Itching and Rash  . Penicillins Itching and Rash     History   Social History  . Marital Status: Widowed    Spouse Name: N/A  . Number of Children: 2  . Years of Education: N/A   Occupational History  .      retired Hydrologist   Social History Main Topics  . Smoking status: Former Smoker -- 3.00 packs/day for 80 years    Types: Cigarettes    Quit date: 09/05/2008  . Smokeless tobacco: Never Used  . Alcohol Use: Yes     Comment: occasional 4 drinks per week  . Drug Use: No  . Sexual Activity: Not Currently   Other Topics Concern  . Not on file   Social History Narrative      Review of Systems  All other systems reviewed and are negative.      Objective:   Physical Exam  Constitutional: He appears well-developed and well-nourished.  HENT:  Right Ear: External ear normal.  Left Ear: External ear normal.  Nose: Nose normal.  Mouth/Throat: Oropharynx is clear and moist. No oropharyngeal exudate.  Eyes: Conjunctivae and EOM are normal. Pupils are equal, round, and reactive to light.  Neck: Neck supple. No thyromegaly present.  Cardiovascular: Normal rate, regular rhythm and normal heart sounds.   Pulmonary/Chest: Effort normal. He has decreased breath sounds. He has wheezes. He has no rhonchi. He has no rales.  Abdominal: Soft. Bowel sounds are normal. He exhibits no distension and no mass. There is no tenderness. There is no rebound and no guarding.  Lymphadenopathy:    He has no cervical adenopathy.  Skin: No rash noted. No erythema.  Vitals reviewed.         Assessment & Plan:  Leukocytosis - Plan: CBC with Differential/Platelet, COMPLETE METABOLIC PANEL WITH GFR, Sedimentation rate, Urinalysis, Routine w reflex microscopic, Culture, blood (single), CANCELED: Save smear  Right-sided chest pain  I suspect the patient has some type of myelodysplastic syndrome. He has a history of thrombocytopenia as well as mild leukocytosis. I will repeat a CBC. I will also check a peripheral smear. Given the  patient's advanced age, and his dementia, I would be hesitant to refer the patient for a bone marrow biopsy unless there is signs of acute leukemia.  I will also workup potential causes of infection including a urinalysis, blood culture. Consider a CT scan of the chest given his right-sided chest wall pain. I do not believe the chest pain is cardiac in nature. I will await the results of his lab work and then determine the next step of the workup

## 2015-01-19 LAB — IMMUNOPHENOTYPING BY FLOW CYTOMETRY

## 2015-01-19 LAB — REFLEX BLAST SCREEN

## 2015-01-20 ENCOUNTER — Telehealth: Payer: Self-pay | Admitting: Family Medicine

## 2015-01-20 DIAGNOSIS — R888 Abnormal findings in other body fluids and substances: Secondary | ICD-10-CM

## 2015-01-20 DIAGNOSIS — D7289 Other specified disorders of white blood cells: Secondary | ICD-10-CM

## 2015-01-20 NOTE — Telephone Encounter (Signed)
LMTRC

## 2015-01-20 NOTE — Telephone Encounter (Signed)
Patient is calling to ask if lab results were in  [phone]

## 2015-01-20 NOTE — Telephone Encounter (Signed)
Pt's sister aware and referral placed

## 2015-01-22 LAB — CULTURE, BLOOD (SINGLE): ORGANISM ID, BACTERIA: NO GROWTH

## 2015-02-03 ENCOUNTER — Other Ambulatory Visit: Payer: Self-pay | Admitting: Family Medicine

## 2015-02-03 ENCOUNTER — Inpatient Hospital Stay: Payer: Medicare Other | Attending: Oncology | Admitting: Oncology

## 2015-02-03 ENCOUNTER — Inpatient Hospital Stay: Payer: Medicare Other

## 2015-02-03 VITALS — BP 117/64 | HR 60 | Temp 97.5°F | Resp 16 | Ht 67.72 in | Wt 151.9 lb

## 2015-02-03 DIAGNOSIS — E785 Hyperlipidemia, unspecified: Secondary | ICD-10-CM | POA: Diagnosis not present

## 2015-02-03 DIAGNOSIS — I4891 Unspecified atrial fibrillation: Secondary | ICD-10-CM

## 2015-02-03 DIAGNOSIS — G8929 Other chronic pain: Secondary | ICD-10-CM | POA: Diagnosis not present

## 2015-02-03 DIAGNOSIS — E559 Vitamin D deficiency, unspecified: Secondary | ICD-10-CM

## 2015-02-03 DIAGNOSIS — I10 Essential (primary) hypertension: Secondary | ICD-10-CM

## 2015-02-03 DIAGNOSIS — D72820 Lymphocytosis (symptomatic): Secondary | ICD-10-CM | POA: Diagnosis not present

## 2015-02-03 DIAGNOSIS — D72829 Elevated white blood cell count, unspecified: Secondary | ICD-10-CM

## 2015-02-03 DIAGNOSIS — G2581 Restless legs syndrome: Secondary | ICD-10-CM

## 2015-02-03 DIAGNOSIS — J449 Chronic obstructive pulmonary disease, unspecified: Secondary | ICD-10-CM | POA: Diagnosis not present

## 2015-02-03 DIAGNOSIS — Z7901 Long term (current) use of anticoagulants: Secondary | ICD-10-CM

## 2015-02-03 DIAGNOSIS — Z79899 Other long term (current) drug therapy: Secondary | ICD-10-CM | POA: Diagnosis not present

## 2015-02-03 DIAGNOSIS — Z809 Family history of malignant neoplasm, unspecified: Secondary | ICD-10-CM

## 2015-02-03 DIAGNOSIS — I2510 Atherosclerotic heart disease of native coronary artery without angina pectoris: Secondary | ICD-10-CM | POA: Diagnosis not present

## 2015-02-03 DIAGNOSIS — Z801 Family history of malignant neoplasm of trachea, bronchus and lung: Secondary | ICD-10-CM | POA: Diagnosis not present

## 2015-02-03 DIAGNOSIS — N529 Male erectile dysfunction, unspecified: Secondary | ICD-10-CM

## 2015-02-03 DIAGNOSIS — Z87891 Personal history of nicotine dependence: Secondary | ICD-10-CM

## 2015-02-03 DIAGNOSIS — J45909 Unspecified asthma, uncomplicated: Secondary | ICD-10-CM | POA: Diagnosis not present

## 2015-02-03 DIAGNOSIS — M549 Dorsalgia, unspecified: Secondary | ICD-10-CM

## 2015-02-03 DIAGNOSIS — D649 Anemia, unspecified: Secondary | ICD-10-CM

## 2015-02-03 DIAGNOSIS — K219 Gastro-esophageal reflux disease without esophagitis: Secondary | ICD-10-CM

## 2015-02-03 LAB — CBC WITH DIFFERENTIAL/PLATELET
BASOS ABS: 0 10*3/uL (ref 0–0.1)
Basophils Relative: 0 %
EOS ABS: 0.3 10*3/uL (ref 0–0.7)
EOS PCT: 2 %
HCT: 32.4 % — ABNORMAL LOW (ref 40.0–52.0)
Hemoglobin: 10.4 g/dL — ABNORMAL LOW (ref 13.0–18.0)
Lymphocytes Relative: 68 %
Lymphs Abs: 11.4 10*3/uL — ABNORMAL HIGH (ref 1.0–3.6)
MCH: 29.1 pg (ref 26.0–34.0)
MCHC: 32 g/dL (ref 32.0–36.0)
MCV: 90.9 fL (ref 80.0–100.0)
MONO ABS: 0.8 10*3/uL (ref 0.2–1.0)
Monocytes Relative: 5 %
NEUTROS PCT: 25 %
Neutro Abs: 4.2 10*3/uL (ref 1.4–6.5)
PLATELETS: 114 10*3/uL — AB (ref 150–440)
RBC: 3.56 MIL/uL — ABNORMAL LOW (ref 4.40–5.90)
RDW: 14.1 % (ref 11.5–14.5)
WBC: 16.7 10*3/uL — ABNORMAL HIGH (ref 3.8–10.6)

## 2015-02-17 ENCOUNTER — Telehealth: Payer: Self-pay | Admitting: Cardiology

## 2015-02-17 ENCOUNTER — Encounter: Payer: Medicare Other | Admitting: *Deleted

## 2015-02-17 NOTE — Telephone Encounter (Signed)
LMOVM reminding pt to send remote transmission.   

## 2015-02-18 ENCOUNTER — Encounter: Payer: Self-pay | Admitting: Cardiology

## 2015-02-26 ENCOUNTER — Encounter: Payer: Self-pay | Admitting: Family Medicine

## 2015-02-26 ENCOUNTER — Ambulatory Visit (INDEPENDENT_AMBULATORY_CARE_PROVIDER_SITE_OTHER): Payer: Medicare Other | Admitting: Family Medicine

## 2015-02-26 VITALS — BP 98/48 | HR 68 | Temp 98.1°F | Resp 18 | Wt 154.0 lb

## 2015-02-26 DIAGNOSIS — S40021A Contusion of right upper arm, initial encounter: Secondary | ICD-10-CM

## 2015-02-26 NOTE — Progress Notes (Signed)
Subjective:    Patient ID: [name] [name], male    DOB: [birth year], 81 y.o.   MRN: [id number]  HPI  Symptoms began Saturday night. Patient denies any injury to his arm. He denies any pain in his arm. Patient has a large bruise covering his entire right bicep. There is also a visible deformity in the bicep. There is a large lump towards the distal portion of his bicep. There is also a small lump at the superior portion of his bicep. There is no tenderness to palpation in the arm. Patient is able to flex and extend his elbow. Muscle strength in the right upper extremity is 5 over 5 equal and symmetric with the left upper extremity. Past Medical History  Diagnosis Date  . CAD (coronary artery disease)   . Hypertension   . Vitamin D deficiency   . Chronic back pain   . Anemia   . ED (erectile dysfunction)   . Arthritis   . RLS (restless legs syndrome)   . GERD (gastroesophageal reflux disease)   . COPD (chronic obstructive pulmonary disease)   . Hyperlipidemia   . Atrial fibrillation   . Second degree Mobitz II AV block     s/p PPM  . Asthma 09/15/2011  . Long term current use of anticoagulant 09/15/2011  . Thrombocytopenia 09/15/2011  . Lymphocytosis   . CHF (congestive heart failure) 01/01/2013  . Allergy    Past Surgical History  Procedure Laterality Date  . Coronary artery bypass graft    . Cataract extraction    . Pacemaker insertion     Current Outpatient Prescriptions on File Prior to Visit  Medication Sig Dispense Refill  . albuterol (PROVENTIL HFA;VENTOLIN HFA) 108 (90 BASE) MCG/ACT inhaler Inhale 2 puffs into the lungs every 6 (six) hours as needed. For wheezing or shortness of breath 18 g 11  . albuterol (PROVENTIL HFA;VENTOLIN HFA) 108 (90 BASE) MCG/ACT inhaler Inhale 2 puffs into the lungs every 6 (six) hours as needed for wheezing or shortness of breath. 1 Inhaler 2  . cyclobenzaprine (FLEXERIL) 5 MG tablet Take 1 tablet (5 mg total) by mouth 3 (three) times daily as  needed for muscle spasms. 90 tablet 2  . diphenhydrAMINE (BENADRYL) 25 mg capsule Take 25 mg by mouth every 6 (six) hours as needed for allergies.    Marland Kitchen donepezil (ARICEPT) 5 MG tablet Take 1 tablet (5 mg total) by mouth at bedtime. 90 tablet 1  . FLONASE 50 MCG/ACT nasal spray Place 1 spray into both nostrils daily. 1 g 2  . furosemide (LASIX) 40 MG tablet Take 1 tablet (40 mg total) by mouth daily. 30 tablet 6  . gabapentin (NEURONTIN) 100 MG capsule TAKE TWO CAPSULES BY MOUTH AT BEDTIME 90 capsule 3  . isosorbide mononitrate (IMDUR) 30 MG 24 hr tablet Take 0.5 tablets (15 mg total) by mouth every morning. 45 tablet 3  . Multiple Vitamin (MULTIVITAMIN) tablet Take 1 tablet by mouth every morning.     . pantoprazole (PROTONIX) 40 MG tablet Take 1 tablet (40 mg total) by mouth every morning. 90 tablet 1  . Rivaroxaban (XARELTO) 15 MG TABS tablet Take 1 tablet (15 mg total) by mouth daily with supper. 30 tablet 6  . rOPINIRole (REQUIP) 4 MG tablet Take 1 tablet (4 mg total) by mouth at bedtime. 90 tablet 1  . simvastatin (ZOCOR) 10 MG tablet Take 1 tablet (10 mg total) by mouth daily at 6 PM. 90 tablet 1  .  tamsulosin (FLOMAX) 0.4 MG CAPS capsule TAKE ONE CAPSULE BY MOUTH ONCE DAILY 90 capsule 2  . tiotropium (SPIRIVA HANDIHALER) 18 MCG inhalation capsule Place 1 capsule (18 mcg total) into inhaler and inhale daily. 30 capsule 5   No current facility-administered medications on file prior to visit.   Allergies  Allergen Reactions  . Amoxicillin Itching and Rash  . Penicillins Itching and Rash   History   Social History  . Marital Status: Widowed    Spouse Name: N/A  . Number of Children: 2  . Years of Education: N/A   Occupational History  .      retired Hydrologist   Social History Main Topics  . Smoking status: Former Smoker -- 3.00 packs/day for 80 years    Types: Cigarettes    Quit date: 09/05/2008  . Smokeless tobacco: Never Used  . Alcohol Use: Yes     Comment:  occasional 4 drinks per week  . Drug Use: No  . Sexual Activity: Not Currently   Other Topics Concern  . Not on file   Social History Narrative     Review of Systems  All other systems reviewed and are negative.      Objective:   Physical Exam  Cardiovascular: Normal rate and normal heart sounds.   Pulmonary/Chest: Effort normal and breath sounds normal.  Vitals reviewed.  please see the description in the history present illness. There is a large bruise covering the entire bicep from his elbow to the shoulder. There is a large lump in the distal portion of the biceps. There is a small lump at the superior portion of the bicep near the deltoid.        Assessment & Plan:  Hematoma of arm, right, initial encounter  I suspect that the patient has torn his biceps. I suspect that he developed a large hematoma due to his anticoagulation including Xarelto an aspirin. I recommended that they temporarily discontinue the aspirin. I applied a pressure dressing to his bicep to prevent further bleeding. At the present time there is no indication for surgery as he still has adequate range of motion in his elbow and shoulder and no significant pain. Therefore I recommended rechecking the patient in 3-4 weeks or sooner if worse. Patient can resume his aspirin in 5 or 6 days

## 2015-03-02 ENCOUNTER — Other Ambulatory Visit: Payer: Self-pay

## 2015-03-03 NOTE — Progress Notes (Signed)
Center  [phone] [phone]  ID: [name] [name] OB: 24-Jan-1926  MR#: [id number]  POE#:[id number]  Patient Care Team: Susy Frizzle, MD as PCP - General (Family Medicine) Hillary Bow, MD as Attending Physician (Cardiology)  CHIEF COMPLAINT:  Chief Complaint  Patient presents with  . New Evaluation    INTERVAL HISTORY: Patient is an 81 year old male with multiple medical problems who was found to have atypical lymphocytes on peripheral blood smear review. He also has an elevated white count with a lymphocyte predominance. He currently feels well and is asymptomatic. He denies any fevers, chills, or night sweats. He has good appetite and denies weight loss. He has no chest pain or shortness of breath. He denies any nausea, vomiting, constipation, or diarrhea. He has no urinary complaints. Patient feels at his baseline and offers no specific complaints today.  REVIEW OF SYSTEMS:   Review of Systems  Constitutional: Negative for fever, weight loss and malaise/fatigue.  Respiratory: Negative.   Cardiovascular: Negative.   Neurological: Negative for weakness.    As per HPI. Otherwise, a complete review of systems is negatve.  PAST MEDICAL HISTORY: Past Medical History  Diagnosis Date  . CAD (coronary artery disease)   . Hypertension   . Vitamin D deficiency   . Chronic back pain   . Anemia   . ED (erectile dysfunction)   . Arthritis   . RLS (restless legs syndrome)   . GERD (gastroesophageal reflux disease)   . COPD (chronic obstructive pulmonary disease)   . Hyperlipidemia   . Atrial fibrillation   . Second degree Mobitz II AV block     s/p PPM  . Asthma 09/15/2011  . Long term current use of anticoagulant 09/15/2011  . Thrombocytopenia 09/15/2011  . Lymphocytosis   . CHF (congestive heart failure) 01/01/2013  . Allergy     PAST SURGICAL HISTORY: Past Surgical History  Procedure Laterality Date  . Coronary artery  bypass graft    . Cataract extraction    . Pacemaker insertion      FAMILY HISTORY Family History  Problem Relation Age of Onset  . Diabetes Sister   . Cancer Mother     lung cancer  . Stroke Father   . Cancer Brother     brain cancer  . Alcohol abuse Brother        ADVANCED DIRECTIVES:    HEALTH MAINTENANCE: History  Substance Use Topics  . Smoking status: Former Smoker -- 3.00 packs/day for 80 years    Types: Cigarettes    Quit date: 09/05/2008  . Smokeless tobacco: Never Used  . Alcohol Use: Yes     Comment: occasional 4 drinks per week     Colonoscopy:  PAP:  Bone density:  Lipid panel:  Allergies  Allergen Reactions  . Amoxicillin Itching and Rash  . Penicillins Itching and Rash    Current Outpatient Prescriptions  Medication Sig Dispense Refill  . albuterol (PROVENTIL HFA;VENTOLIN HFA) 108 (90 BASE) MCG/ACT inhaler Inhale 2 puffs into the lungs every 6 (six) hours as needed. For wheezing or shortness of breath 18 g 11  . diphenhydrAMINE (BENADRYL) 25 mg capsule Take 25 mg by mouth every 6 (six) hours as needed for allergies.    Marland Kitchen donepezil (ARICEPT) 5 MG tablet Take 1 tablet (5 mg total) by mouth at bedtime. 90 tablet 1  . furosemide (LASIX) 40 MG tablet Take 1 tablet (40 mg total) by mouth daily. 30 tablet 6  .  isosorbide mononitrate (IMDUR) 30 MG 24 hr tablet Take 0.5 tablets (15 mg total) by mouth every morning. 45 tablet 3  . Multiple Vitamin (MULTIVITAMIN) tablet Take 1 tablet by mouth every morning.     . pantoprazole (PROTONIX) 40 MG tablet Take 1 tablet (40 mg total) by mouth every morning. 90 tablet 1  . Rivaroxaban (XARELTO) 15 MG TABS tablet Take 1 tablet (15 mg total) by mouth daily with supper. 30 tablet 6  . rOPINIRole (REQUIP) 4 MG tablet Take 1 tablet (4 mg total) by mouth at bedtime. 90 tablet 1  . simvastatin (ZOCOR) 10 MG tablet Take 1 tablet (10 mg total) by mouth daily at 6 PM. 90 tablet 1  . tamsulosin (FLOMAX) 0.4 MG CAPS capsule  TAKE ONE CAPSULE BY MOUTH ONCE DAILY 90 capsule 2  . albuterol (PROVENTIL HFA;VENTOLIN HFA) 108 (90 BASE) MCG/ACT inhaler Inhale 2 puffs into the lungs every 6 (six) hours as needed for wheezing or shortness of breath. 1 Inhaler 2  . cyclobenzaprine (FLEXERIL) 5 MG tablet Take 1 tablet (5 mg total) by mouth 3 (three) times daily as needed for muscle spasms. 90 tablet 2  . FLONASE 50 MCG/ACT nasal spray Place 1 spray into both nostrils daily. 1 g 2  . gabapentin (NEURONTIN) 100 MG capsule TAKE TWO CAPSULES BY MOUTH AT BEDTIME 90 capsule 3  . tiotropium (SPIRIVA HANDIHALER) 18 MCG inhalation capsule Place 1 capsule (18 mcg total) into inhaler and inhale daily. 30 capsule 5   No current facility-administered medications for this visit.    OBJECTIVE: Filed Vitals:   02/03/15 1631  BP: 117/64  Pulse: 60  Temp: 97.5 F (36.4 C)  Resp: 16     Body mass index is 23.29 kg/(m^2).    ECOG FS:1 - Symptomatic but completely ambulatory  General: Well-developed, well-nourished, no acute distress. Eyes: Pink conjunctiva, anicteric sclera. HEENT: Normocephalic, moist mucous membranes, clear oropharnyx. Lungs: Clear to auscultation bilaterally. Heart: Regular rate and rhythm. No rubs, murmurs, or gallops. Abdomen: Soft, nontender, nondistended. No organomegaly noted, normoactive bowel sounds. Musculoskeletal: No edema, cyanosis, or clubbing. Neuro: Alert, answering all questions appropriately. Cranial nerves grossly intact. Skin: No rashes or petechiae noted. Psych: Normal affect. Lymphatics: No cervical, calvicular, axillary or inguinal LAD.   LAB RESULTS:  Lab Results  Component Value Date   NA 140 01/15/2015   K 4.0 01/15/2015   CL 100 01/15/2015   CO2 28 01/15/2015   GLUCOSE 92 01/15/2015   BUN 18 01/15/2015   CREATININE 1.33 01/15/2015   CALCIUM 9.6 01/15/2015   PROT 6.8 01/15/2015   ALBUMIN 4.3 01/15/2015   AST 20 01/15/2015   ALT 12 01/15/2015   ALKPHOS 76 01/15/2015   BILITOT  0.7 01/15/2015   GFRNONAA 48* 01/15/2015   GFRAA 55* 01/15/2015    Lab Results  Component Value Date   WBC 16.7* 02/03/2015   NEUTROABS 4.2 02/03/2015   HGB 10.4* 02/03/2015   HCT 32.4* 02/03/2015   MCV 90.9 02/03/2015   PLT 114* 02/03/2015     STUDIES: No results found.  ASSESSMENT: Atypical lymphocytes, probable CLL.  PLAN:    1. Atypical lymphocytes: Peripheral smear along with leukocytosis with lymphocyte predominance as well as mild anemia and thrombocytopenia is highly suggestive of underlying CLL. Unfortunately, flow cytometry was collected incorrectly and not finalized. If CLL were confirmed, patient would be considered a stage 0. No intervention is needed at this time. Patient does not require bone marrow biopsy. If CLL can be confirmed,  we will get a initial staging CT to rule out any lymphadenopathy. Return to clinic in 3 months with repeat laboratory work and further evaluation.  Patient expressed understanding and was in agreement with this plan. He also understands that He can call clinic at any time with any questions, concerns, or complaints.   No matching staging information was found for the patient.  Lloyd Huger, MD   03/03/2015 6:21 PM

## 2015-03-30 ENCOUNTER — Ambulatory Visit (INDEPENDENT_AMBULATORY_CARE_PROVIDER_SITE_OTHER): Payer: Medicare Other | Admitting: Family Medicine

## 2015-03-30 ENCOUNTER — Encounter: Payer: Self-pay | Admitting: Family Medicine

## 2015-03-30 VITALS — BP 100/60 | HR 60 | Temp 97.8°F | Resp 22 | Wt 156.0 lb

## 2015-03-30 DIAGNOSIS — D72829 Elevated white blood cell count, unspecified: Secondary | ICD-10-CM

## 2015-03-30 DIAGNOSIS — R888 Abnormal findings in other body fluids and substances: Secondary | ICD-10-CM | POA: Diagnosis not present

## 2015-03-30 DIAGNOSIS — S40021A Contusion of right upper arm, initial encounter: Secondary | ICD-10-CM | POA: Diagnosis not present

## 2015-03-30 DIAGNOSIS — D7289 Other specified disorders of white blood cells: Secondary | ICD-10-CM

## 2015-03-30 DIAGNOSIS — J3489 Other specified disorders of nose and nasal sinuses: Secondary | ICD-10-CM

## 2015-03-30 MED ORDER — IPRATROPIUM BROMIDE 0.06 % NA SOLN: 2.0000 | Freq: Four times a day (QID) | NASAL | Status: DC

## 2015-03-30 NOTE — Progress Notes (Signed)
Subjective:    Patient ID: [name] [name], male    DOB: 1926/03/15, 81 y.o.   MRN: [id number]  HPI Please see my last office visit. The hematoma on his right upper arm has completely resolved. He is able to flex and extend his right elbow without pain or difficulty. He denies any problems with his arm. Patient had atypical lymphocytes on peripheral smear with leukocytosis. He is currently being followed by the oncologist. At the present time they're just monitoring the patient clinically. Patient's lab work was recently checked in May and was all well within normal limits except for his white blood cell count. Patient's only complaint today is persistent rhinorrhea. He is tried [facility] without benefit. He is tried Zyrtec and Claritin and other over-the-counter decongestants such as Coricidin without benefit. Primarily the rhinorrhea is worse in the morning. It is clear. He denies any fevers or chills or sinus pain. Past Medical History  Diagnosis Date  . CAD (coronary artery disease)   . Hypertension   . Vitamin D deficiency   . Chronic back pain   . Anemia   . ED (erectile dysfunction)   . Arthritis   . RLS (restless legs syndrome)   . GERD (gastroesophageal reflux disease)   . COPD (chronic obstructive pulmonary disease)   . Hyperlipidemia   . Atrial fibrillation   . Second degree Mobitz II AV block     s/p PPM  . Asthma 09/15/2011  . Long term current use of anticoagulant 09/15/2011  . Thrombocytopenia 09/15/2011  . Lymphocytosis   . CHF (congestive heart failure) 01/01/2013  . Allergy    Past Surgical History  Procedure Laterality Date  . Coronary artery bypass graft    . Cataract extraction    . Pacemaker insertion     Current Outpatient Prescriptions on File Prior to Visit  Medication Sig Dispense Refill  . albuterol (PROVENTIL HFA;VENTOLIN HFA) 108 (90 BASE) MCG/ACT inhaler Inhale 2 puffs into the lungs every 6 (six) hours as needed. For wheezing or shortness of breath 18 g  11  . albuterol (PROVENTIL HFA;VENTOLIN HFA) 108 (90 BASE) MCG/ACT inhaler Inhale 2 puffs into the lungs every 6 (six) hours as needed for wheezing or shortness of breath. 1 Inhaler 2  . cyclobenzaprine (FLEXERIL) 5 MG tablet Take 1 tablet (5 mg total) by mouth 3 (three) times daily as needed for muscle spasms. 90 tablet 2  . diphenhydrAMINE (BENADRYL) 25 mg capsule Take 25 mg by mouth every 6 (six) hours as needed for allergies.    Marland Kitchen donepezil (ARICEPT) 5 MG tablet Take 1 tablet (5 mg total) by mouth at bedtime. 90 tablet 1  . FLONASE 50 MCG/ACT nasal spray Place 1 spray into both nostrils daily. 1 g 2  . furosemide (LASIX) 40 MG tablet Take 1 tablet (40 mg total) by mouth daily. 30 tablet 6  . gabapentin (NEURONTIN) 100 MG capsule TAKE TWO CAPSULES BY MOUTH AT BEDTIME 90 capsule 3  . isosorbide mononitrate (IMDUR) 30 MG 24 hr tablet Take 0.5 tablets (15 mg total) by mouth every morning. 45 tablet 3  . Multiple Vitamin (MULTIVITAMIN) tablet Take 1 tablet by mouth every morning.     . pantoprazole (PROTONIX) 40 MG tablet Take 1 tablet (40 mg total) by mouth every morning. 90 tablet 1  . Rivaroxaban (XARELTO) 15 MG TABS tablet Take 1 tablet (15 mg total) by mouth daily with supper. 30 tablet 6  . rOPINIRole (REQUIP) 4 MG tablet Take 1 tablet (  4 mg total) by mouth at bedtime. 90 tablet 1  . simvastatin (ZOCOR) 10 MG tablet Take 1 tablet (10 mg total) by mouth daily at 6 PM. 90 tablet 1  . tamsulosin (FLOMAX) 0.4 MG CAPS capsule TAKE ONE CAPSULE BY MOUTH ONCE DAILY 90 capsule 2  . tiotropium (SPIRIVA HANDIHALER) 18 MCG inhalation capsule Place 1 capsule (18 mcg total) into inhaler and inhale daily. 30 capsule 5   No current facility-administered medications on file prior to visit.   Allergies  Allergen Reactions  . Amoxicillin Itching and Rash  . Penicillins Itching and Rash   History   Social History  . Marital Status: Widowed    Spouse Name: N/A  . Number of Children: 2  . Years of  Education: N/A   Occupational History  .      retired Hydrologist   Social History Main Topics  . Smoking status: Former Smoker -- 3.00 packs/day for 80 years    Types: Cigarettes    Quit date: 09/05/2008  . Smokeless tobacco: Never Used  . Alcohol Use: Yes     Comment: occasional 4 drinks per week  . Drug Use: No  . Sexual Activity: Not Currently   Other Topics Concern  . Not on file   Social History Narrative      Review of Systems  All other systems reviewed and are negative.      Objective:   Physical Exam  Constitutional: He appears well-developed and well-nourished.  HENT:  Nose: Rhinorrhea present. No mucosal edema, nasal deformity, septal deviation or nasal septal hematoma. No epistaxis. Right sinus exhibits no maxillary sinus tenderness and no frontal sinus tenderness. Left sinus exhibits no maxillary sinus tenderness and no frontal sinus tenderness.  Cardiovascular: Normal rate and normal heart sounds.   Pulmonary/Chest: Effort normal. He has wheezes.  Abdominal: Soft. Bowel sounds are normal. He exhibits no distension. There is no tenderness. There is no rebound and no guarding.  Musculoskeletal: He exhibits no edema.  Vitals reviewed.         Assessment & Plan:  Rhinorrhea - Plan: ipratropium (ATROVENT) 0.06 % nasal spray  Hematoma of arm, right, initial encounter  Leukocytosis  Atypical lymphocytes present on peripheral blood smear  Hematoma has clinically resolved. There is no further need for follow-up. Patient's leukocytosis is being monitored by oncology. He neck sees them in August. At the present time we are monitoring him clinically with no treatment. I will try the patient on Atrovent nasal spray 2 sprays  Up to 4 times a day as needed for rhinorrhea. I believe his symptoms may be vasomotor rhinitis.

## 2015-04-25 ENCOUNTER — Other Ambulatory Visit: Payer: Self-pay | Admitting: Family Medicine

## 2015-05-05 ENCOUNTER — Inpatient Hospital Stay: Payer: Medicare Other | Admitting: Oncology

## 2015-05-05 ENCOUNTER — Inpatient Hospital Stay: Payer: Medicare Other

## 2015-05-07 ENCOUNTER — Other Ambulatory Visit: Payer: Medicare Other

## 2015-05-07 ENCOUNTER — Ambulatory Visit: Payer: Medicare Other | Admitting: Oncology

## 2015-06-02 DIAGNOSIS — Z880 Allergy status to penicillin: Secondary | ICD-10-CM | POA: Diagnosis not present

## 2015-06-02 DIAGNOSIS — Z7951 Long term (current) use of inhaled steroids: Secondary | ICD-10-CM | POA: Diagnosis not present

## 2015-06-02 DIAGNOSIS — Z87891 Personal history of nicotine dependence: Secondary | ICD-10-CM | POA: Diagnosis not present

## 2015-06-02 DIAGNOSIS — Z7901 Long term (current) use of anticoagulants: Secondary | ICD-10-CM | POA: Diagnosis not present

## 2015-06-02 DIAGNOSIS — Y92009 Unspecified place in unspecified non-institutional (private) residence as the place of occurrence of the external cause: Secondary | ICD-10-CM | POA: Diagnosis not present

## 2015-06-02 DIAGNOSIS — Y998 Other external cause status: Secondary | ICD-10-CM | POA: Insufficient documentation

## 2015-06-02 DIAGNOSIS — S0101XA Laceration without foreign body of scalp, initial encounter: Secondary | ICD-10-CM | POA: Insufficient documentation

## 2015-06-02 DIAGNOSIS — Y9389 Activity, other specified: Secondary | ICD-10-CM | POA: Insufficient documentation

## 2015-06-02 DIAGNOSIS — Z79899 Other long term (current) drug therapy: Secondary | ICD-10-CM | POA: Diagnosis not present

## 2015-06-02 DIAGNOSIS — W01198A Fall on same level from slipping, tripping and stumbling with subsequent striking against other object, initial encounter: Secondary | ICD-10-CM | POA: Insufficient documentation

## 2015-06-02 DIAGNOSIS — I10 Essential (primary) hypertension: Secondary | ICD-10-CM | POA: Insufficient documentation

## 2015-06-02 DIAGNOSIS — S0990XA Unspecified injury of head, initial encounter: Secondary | ICD-10-CM | POA: Diagnosis present

## 2015-06-02 NOTE — ED Notes (Addendum)
Pt tripped over a vacum cleaner hose and fell tonight.  Pt has a laceration to left side of scalp.  Pt also has skin tear to right forearm.  bleeding controlled. Pt has a pacemaker.  No chest pain or sob.  Pt alert.

## 2015-06-03 ENCOUNTER — Encounter: Payer: Self-pay | Admitting: *Deleted

## 2015-06-03 ENCOUNTER — Emergency Department
Admission: EM | Admit: 2015-06-03 | Discharge: 2015-06-03 | Disposition: A | Discharge status: Home / Self Care | Payer: Medicare Other | Attending: Emergency Medicine | Admitting: Emergency Medicine

## 2015-06-03 ENCOUNTER — Emergency Department: Payer: Medicare Other

## 2015-06-03 DIAGNOSIS — S0101XA Laceration without foreign body of scalp, initial encounter: Secondary | ICD-10-CM

## 2015-06-03 MED ORDER — LIDOCAINE HCL (PF) 1 % IJ SOLN
5.0000 mL | Freq: Once | INTRAMUSCULAR | Status: AC
  Administered 2015-06-03: 5 mL via INTRADERMAL
  Filled 2015-06-03: qty 5

## 2015-06-03 NOTE — ED Notes (Signed)
Pt returned from CT °

## 2015-06-03 NOTE — ED Notes (Signed)
Pt was cleaned and bandaged by Mallie Mussel, RN.

## 2015-06-03 NOTE — ED Provider Notes (Signed)
[facility] Emergency Department Provider Note  ____________________________________________  Time seen: 12:20 AM  I have reviewed the triage vital signs and the nursing notes.   HISTORY  Chief Complaint Laceration and Head Injury      HPI [name] [name] is a 82 y.o. male presents with history of accidental trip and fall over his vacuum cleaner hose tonight. Patient admits to hitting his head, no LOC. Patient's sister states considerable bleeding that was unrelieved at home.     Past Medical History  Diagnosis Date  . CAD (coronary artery disease)   . Hypertension   . Vitamin D deficiency   . Chronic back pain   . Anemia   . ED (erectile dysfunction)   . Arthritis   . RLS (restless legs syndrome)   . GERD (gastroesophageal reflux disease)   . COPD (chronic obstructive pulmonary disease)   . Hyperlipidemia   . Atrial fibrillation   . Second degree Mobitz II AV block     s/p PPM  . Asthma 09/15/2011  . Long term current use of anticoagulant 09/15/2011  . Thrombocytopenia 09/15/2011  . Lymphocytosis   . CHF (congestive heart failure) 01/01/2013  . Allergy     Patient Active Problem List   Diagnosis Date Noted  . Encounter for therapeutic drug monitoring 10/09/2013  . Rotator cuff tear arthropathy of right shoulder 09/04/2013  . Back pain, acute 06/14/2013  . Right shoulder pain 05/03/2013  . Chronic pain in left shoulder 05/03/2013  . Abnormal CT scan, chest 01/10/2013  . Anticoagulated on Coumadin 01/01/2013  . Left shoulder pain 01/01/2013  . CHF (congestive heart failure) 01/01/2013  . Permanent atrial fibrillation 11/02/2012  . Lymphocytosis   . Balanitis 09/11/2012  . UTI (lower urinary tract infection) 09/05/2012  . Shortness of breath 09/05/2012  . Altered mental status 09/05/2012  . Dementia 08/14/2012  . Acute low back pain 07/02/2012  . Cellulitis of right leg 05/28/2012  . Hematoma 05/17/2012  . Leg pain 05/17/2012  .  Pacemaker-St.Jude 04/26/2012  . Eczema 03/14/2012  . Right carotid bruit 01/06/2012  . Long term current use of anticoagulant 09/15/2011  . Thrombocytopenia 09/15/2011  . Preventative health care 09/10/2011  . CONSTIPATION 06/01/2010  . CLOSED DISLOCATION OF ACROMIOCLAVICULAR 06/01/2010  . Abdominal aortic aneurysm 01/28/2010  . COSTOCHONDRITIS 10/22/2009  . Cholesterolosis of gallbladder 07/08/2009  . CHOLELITHIASIS 06/30/2009  . MOBITZ II ATRIOVENTRICULAR BLOCK 06/17/2009  . ATRIAL FIBRILLATION, PAROXYSMAL 06/17/2009  . CAD 01/16/2009  . HYPERTENSION, BENIGN 12/31/2008  . BACK PAIN, CHRONIC 12/31/2008  . Edema 12/31/2008  . HYPOTHYROIDISM 11/05/2008  . VITAMIN D DEFICIENCY 11/05/2008  . UNS ADVRS EFF OTH RX MEDICINAL&BIOLOGICAL SBSTNC 11/05/2008  . ERECTILE DYSFUNCTION 09/02/2008  . ARTHRITIS, HIP 09/02/2008  . RESTLESS LEG SYNDROME 12/27/2007  . INSOMNIA 12/27/2007  . HYPERLIPIDEMIA 08/15/2007  . CORONARY ATHEROSCLEROSIS, ARTERY BYPASS GRAFT 08/15/2007  . COPD 08/15/2007  . GERD 08/15/2007  . FREQUENCY, URINARY 08/15/2007  . BPH (benign prostatic hypertrophy) 08/15/2007    Past Surgical History  Procedure Laterality Date  . Coronary artery bypass graft    . Cataract extraction    . Pacemaker insertion      Current Outpatient Rx  Name  Route  Sig  Dispense  Refill  . albuterol (PROVENTIL HFA;VENTOLIN HFA) 108 (90 BASE) MCG/ACT inhaler   Inhalation   Inhale 2 puffs into the lungs every 6 (six) hours as needed. For wheezing or shortness of breath   18 g   11   .  albuterol (PROVENTIL HFA;VENTOLIN HFA) 108 (90 BASE) MCG/ACT inhaler   Inhalation   Inhale 2 puffs into the lungs every 6 (six) hours as needed for wheezing or shortness of breath.   1 Inhaler   2   . cyclobenzaprine (FLEXERIL) 5 MG tablet   Oral   Take 1 tablet (5 mg total) by mouth 3 (three) times daily as needed for muscle spasms.   90 tablet   2   . diphenhydrAMINE (BENADRYL) 25 mg capsule    Oral   Take 25 mg by mouth every 6 (six) hours as needed for allergies.         Marland Kitchen donepezil (ARICEPT) 5 MG tablet   Oral   Take 1 tablet (5 mg total) by mouth at bedtime.   90 tablet   1   . FLONASE 50 MCG/ACT nasal spray   Each Nare   Place 1 spray into both nostrils daily.   1 g   2     Dispense as written.   . furosemide (LASIX) 40 MG tablet   Oral   Take 1 tablet (40 mg total) by mouth daily.   30 tablet   6   . gabapentin (NEURONTIN) 100 MG capsule      TAKE TWO CAPSULES BY MOUTH AT BEDTIME   90 capsule   3   . ipratropium (ATROVENT) 0.06 % nasal spray   Each Nare   Place 2 sprays into both nostrils 4 (four) times daily.   15 mL   12   . isosorbide mononitrate (IMDUR) 30 MG 24 hr tablet   Oral   Take 0.5 tablets (15 mg total) by mouth every morning.   45 tablet   3   . Multiple Vitamin (MULTIVITAMIN) tablet   Oral   Take 1 tablet by mouth every morning.          . pantoprazole (PROTONIX) 40 MG tablet   Oral   Take 1 tablet (40 mg total) by mouth every morning.   90 tablet   1   . Rivaroxaban (XARELTO) 15 MG TABS tablet   Oral   Take 1 tablet (15 mg total) by mouth daily with supper.   30 tablet   6   . rOPINIRole (REQUIP) 4 MG tablet   Oral   Take 1 tablet (4 mg total) by mouth at bedtime.   90 tablet   1   . simvastatin (ZOCOR) 10 MG tablet   Oral   Take 1 tablet (10 mg total) by mouth daily at 6 PM.   90 tablet   1   . tamsulosin (FLOMAX) 0.4 MG CAPS capsule      TAKE ONE CAPSULE BY MOUTH ONCE DAILY   90 capsule   3   . tiotropium (SPIRIVA HANDIHALER) 18 MCG inhalation capsule   Inhalation   Place 1 capsule (18 mcg total) into inhaler and inhale daily.   30 capsule   5     Allergies Amoxicillin and Penicillins  Family History  Problem Relation Age of Onset  . Diabetes Sister   . Cancer Mother     lung cancer  . Stroke Father   . Cancer Brother     brain cancer  . Alcohol abuse Brother     Social  History Social History  Substance Use Topics  . Smoking status: Former Smoker -- 3.00 packs/day for 80 years    Types: Cigarettes    Quit date: 09/05/2008  . Smokeless tobacco: Never Used  .  Alcohol Use: No     Comment: occasional 4 drinks per week    Review of Systems  Constitutional: Negative for fever. Eyes: Negative for visual changes. ENT: Negative for sore throat. Cardiovascular: Negative for chest pain. Respiratory: Negative for shortness of breath. Gastrointestinal: Negative for abdominal pain, vomiting and diarrhea. Genitourinary: Negative for dysuria. Musculoskeletal: Negative for back pain. Skin: Positive head laceration Neurological: Negative for headaches, focal weakness or numbness.   10-point ROS otherwise negative.  ____________________________________________   PHYSICAL EXAM:  VITAL SIGNS: ED Triage Vitals  Enc Vitals Group     BP 06/02/15 2358 120/64 mmHg     Pulse Rate 06/03/15 0004 61     Resp 06/02/15 2358 20     Temp 06/02/15 2358 98.6 F (37 C)     Temp Source 06/02/15 2358 Oral     SpO2 06/03/15 0004 100 %     Weight 06/02/15 2358 152 lb (68.947 kg)     Height 06/02/15 2358 5\' 9"  (1.753 m)     Head Cir --      Peak Flow --      Pain Score --      Pain Loc --      Pain Edu? --      Excl. in [place]? --      Constitutional: Alert and oriented. Well appearing and in no distress. Eyes: Conjunctivae are normal. PERRL. Normal extraocular movements. ENT   Head: Normocephalic and atraumatic.   Nose: No congestion/rhinnorhea.   Mouth/Throat: Mucous membranes are moist.   Neck: No stridor. Cardiovascular: Normal rate, regular rhythm. Normal and symmetric distal pulses are present in all extremities. No murmurs, rubs, or gallops. Respiratory: Normal respiratory effort without tachypnea nor retractions. Breath sounds are clear and equal bilaterally. No wheezes/rales/rhonchi. Gastrointestinal: Soft and nontender. No distention. There is  no CVA tenderness. Genitourinary: deferred Musculoskeletal: Nontender with normal range of motion in all extremities. No joint effusions.  No lower extremity tenderness nor edema. Neurologic:  Normal speech and language. No gross focal neurologic deficits are appreciated. Speech is normal.  Skin:  3 cm linear left frontal temporal scalp wound Psychiatric: Mood and affect are normal. Speech and behavior are normal. Patient exhibits appropriate insight and judgment.      RADIOLOGY  CT Head Wo Contrast (Final result) Result time: 06/03/15 00:49:03   Final result by Rad Results In Interface (06/03/15 00:49:03)   Narrative:   CLINICAL DATA: Initial evaluation for acute trauma, fall.  EXAM: CT HEAD WITHOUT CONTRAST  CT CERVICAL SPINE WITHOUT CONTRAST  TECHNIQUE: Multidetector CT imaging of the head and cervical spine was performed following the standard protocol without intravenous contrast. Multiplanar CT image reconstructions of the cervical spine were also generated.  COMPARISON: Prior study from 07/09/2014.  FINDINGS: CT HEAD FINDINGS  Left frontal scalp contusion present. Scalp soft tissues otherwise unremarkable. No acute abnormality about the orbits.  No acute calvarial fracture. Paranasal sinuses and mastoid air cells are clear.  Vascular calcifications within the carotid siphons. Advanced cerebral atrophy with chronic microvascular ischemic disease.  No acute large vessel territory infarct. No intracranial hemorrhage. No mass lesion or midline shift. No mass effect. Ventricular prominence related global parenchymal volume loss present without hydrocephalus.  CT CERVICAL SPINE FINDINGS  Trace anterolisthesis of C3 on C4 and T2 on T3. Otherwise, the vertebral bodies are normally aligned with preservation of the normal cervical lordosis. Vertebral body heights are preserved. Normal C1-2 articulations are intact. No prevertebral soft tissue swelling. No acute  fracture or listhesis.  Advanced degenerative spondylolysis as evidenced by intervertebral disc space narrowing, endplate sclerosis, and osteophytosis present at C4-5 and C5-6, and to a lesser extent C6-7 and C7-T1.  Visualized soft tissues of the neck demonstrate no acute abnormality. Prominent vascular calcifications about the carotid bifurcations. Visualized lung apices are clear without evidence of apical pneumothorax.  IMPRESSION: CT BRAIN:  1. No acute intracranial process. 2. Left frontal scalp contusion. 3. Advanced cerebral atrophy with chronic small vessel ischemic disease CT CERVICAL SPINE:  No acute traumatic injury within the cervical spine.   Electronically Signed By: Jeannine Boga M.D. On: 06/03/2015 00:49          CT Cervical Spine Wo Contrast (Final result) Result time: 06/03/15 00:49:03   Final result by Rad Results In Interface (06/03/15 00:49:03)   Narrative:   CLINICAL DATA: Initial evaluation for acute trauma, fall.  EXAM: CT HEAD WITHOUT CONTRAST  CT CERVICAL SPINE WITHOUT CONTRAST  TECHNIQUE: Multidetector CT imaging of the head and cervical spine was performed following the standard protocol without intravenous contrast. Multiplanar CT image reconstructions of the cervical spine were also generated.  COMPARISON: Prior study from 07/09/2014.  FINDINGS: CT HEAD FINDINGS  Left frontal scalp contusion present. Scalp soft tissues otherwise unremarkable. No acute abnormality about the orbits.  No acute calvarial fracture. Paranasal sinuses and mastoid air cells are clear.  Vascular calcifications within the carotid siphons. Advanced cerebral atrophy with chronic microvascular ischemic disease.  No acute large vessel territory infarct. No intracranial hemorrhage. No mass lesion or midline shift. No mass effect. Ventricular prominence related global parenchymal volume loss present without hydrocephalus.  CT CERVICAL  SPINE FINDINGS  Trace anterolisthesis of C3 on C4 and T2 on T3. Otherwise, the vertebral bodies are normally aligned with preservation of the normal cervical lordosis. Vertebral body heights are preserved. Normal C1-2 articulations are intact. No prevertebral soft tissue swelling. No acute fracture or listhesis.  Advanced degenerative spondylolysis as evidenced by intervertebral disc space narrowing, endplate sclerosis, and osteophytosis present at C4-5 and C5-6, and to a lesser extent C6-7 and C7-T1.  Visualized soft tissues of the neck demonstrate no acute abnormality. Prominent vascular calcifications about the carotid bifurcations. Visualized lung apices are clear without evidence of apical pneumothorax.  IMPRESSION: CT BRAIN:  1. No acute intracranial process. 2. Left frontal scalp contusion. 3. Advanced cerebral atrophy with chronic small vessel ischemic disease CT CERVICAL SPINE:  No acute traumatic injury within the cervical spine.   Electronically Signed By: Jeannine Boga M.D. On: 06/03/2015 00:49      Procedure note:LACERATION REPAIR Performed by: Gregor Hams Authorized by: Gregor Hams Consent: Verbal consent obtained. Risks and benefits: risks, benefits and alternatives were discussed Consent given by: patient Patient identity confirmed: provided demographic data Prepped and Draped in normal sterile fashion Wound explored  Laceration Location: left scalp  Laceration Length: 3cm  No Foreign Bodies seen or palpated  Anesthesia: local infiltration  Local anesthetic: lidocaine 1%  Anesthetic total: 3 ml  Irrigation method: syringe Amount of cleaning: standard  Skin closure: Staple closure   Number of Staples: 3    patient's wound. Bleeding controlled. Patient tolerance: Patient tolerated the procedure well with no immediate complications.  INITIAL IMPRESSION / ASSESSMENT AND PLAN / ED COURSE  Pertinent labs & imaging  results that were available during my care of the patient were reviewed by me and considered in my medical decision making (see chart for details).    ____________________________________________   FINAL CLINICAL  IMPRESSION(S) / ED DIAGNOSES  Final diagnoses:  Scalp laceration, initial encounter      Gregor Hams, MD 06/03/15 [phone]

## 2015-06-03 NOTE — ED Notes (Signed)
Pt went to CT

## 2015-06-03 NOTE — Discharge Instructions (Signed)
Laceration Care, Adult °A laceration is a cut or lesion that goes through all layers of the skin and into the tissue just beneath the skin. °TREATMENT  °Some lacerations may not require closure. Some lacerations may not be able to be closed due to an increased risk of infection. It is important to see your caregiver as soon as possible after an injury to minimize the risk of infection and maximize the opportunity for successful closure. °If closure is appropriate, pain medicines may be given, if needed. The wound will be cleaned to help prevent infection. Your caregiver will use stitches (sutures), staples, wound glue (adhesive), or skin adhesive strips to repair the laceration. These tools bring the skin edges together to allow for faster healing and a better cosmetic outcome. However, all wounds will heal with a scar. Once the wound has healed, scarring can be minimized by covering the wound with sunscreen during the day for 1 full year. °HOME CARE INSTRUCTIONS  °For sutures or staples: °· Keep the wound clean and dry. °· If you were given a bandage (dressing), you should change it at least once a day. Also, change the dressing if it becomes wet or dirty, or as directed by your caregiver. °· Wash the wound with soap and water 2 times a day. Rinse the wound off with water to remove all soap. Pat the wound dry with a clean towel. °· After cleaning, apply a thin layer of the antibiotic ointment as recommended by your caregiver. This will help prevent infection and keep the dressing from sticking. °· You may shower as usual after the first 24 hours. Do not soak the wound in water until the sutures are removed. °· Only take over-the-counter or prescription medicines for pain, discomfort, or fever as directed by your caregiver. °· Get your sutures or staples removed as directed by your caregiver. °For skin adhesive strips: °· Keep the wound clean and dry. °· Do not get the skin adhesive strips wet. You may bathe  carefully, using caution to keep the wound dry. °· If the wound gets wet, pat it dry with a clean towel. °· Skin adhesive strips will fall off on their own. You may trim the strips as the wound heals. Do not remove skin adhesive strips that are still stuck to the wound. They will fall off in time. °For wound adhesive: °· You may briefly wet your wound in the shower or bath. Do not soak or scrub the wound. Do not swim. Avoid periods of heavy perspiration until the skin adhesive has fallen off on its own. After showering or bathing, gently pat the wound dry with a clean towel. °· Do not apply liquid medicine, cream medicine, or ointment medicine to your wound while the skin adhesive is in place. This may loosen the film before your wound is healed. °· If a dressing is placed over the wound, be careful not to apply tape directly over the skin adhesive. This may cause the adhesive to be pulled off before the wound is healed. °· Avoid prolonged exposure to sunlight or tanning lamps while the skin adhesive is in place. Exposure to ultraviolet light in the first year will darken the scar. °· The skin adhesive will usually remain in place for 5 to 10 days, then naturally fall off the skin. Do not pick at the adhesive film. °You may need a tetanus shot if: °· You cannot remember when you had your last tetanus shot. °· You have never had a tetanus   shot. If you get a tetanus shot, your arm may swell, get red, and feel warm to the touch. This is common and not a problem. If you need a tetanus shot and you choose not to have one, there is a rare chance of getting tetanus. Sickness from tetanus can be serious. SEEK MEDICAL CARE IF:   You have redness, swelling, or increasing pain in the wound.  You see a red line that goes away from the wound.  You have yellowish-white fluid (pus) coming from the wound.  You have a fever.  You notice a bad smell coming from the wound or dressing.  Your wound breaks open before or  after sutures have been removed.  You notice something coming out of the wound such as wood or glass.  Your wound is on your hand or foot and you cannot move a finger or toe. SEEK IMMEDIATE MEDICAL CARE IF:   Your pain is not controlled with prescribed medicine.  You have severe swelling around the wound causing pain and numbness or a change in color in your arm, hand, leg, or foot.  Your wound splits open and starts bleeding.  You have worsening numbness, weakness, or loss of function of any joint around or beyond the wound.  You develop painful lumps near the wound or on the skin anywhere on your body. MAKE SURE YOU:   Understand these instructions.  Will watch your condition.  Will get help right away if you are not doing well or get worse. Document Released: 08/22/2005 Document Revised: 11/14/2011 Document Reviewed: 02/15/2011 [facility] Patient Information 2015 [place], [place]. This information is not intended to replace advice given to you by your health care provider. Make sure you discuss any questions you have with your health care provider. Please have staples removed in 7 days

## 2015-07-10 ENCOUNTER — Other Ambulatory Visit: Payer: Self-pay | Admitting: Family Medicine

## 2015-07-13 ENCOUNTER — Other Ambulatory Visit: Payer: Self-pay | Admitting: *Deleted

## 2015-07-13 MED ORDER — PANTOPRAZOLE SODIUM 40 MG PO TBEC: 40.0000 mg | DELAYED_RELEASE_TABLET | Freq: Every morning | ORAL | Status: DC

## 2015-07-13 NOTE — Telephone Encounter (Signed)
Received fax requesting refill on Pantoprazole.   Refill appropriate and filled per protocol. 

## 2015-07-13 NOTE — Telephone Encounter (Signed)
Medication refilled per protocol. 

## 2015-07-14 ENCOUNTER — Telehealth: Payer: Self-pay | Admitting: *Deleted

## 2015-07-14 NOTE — Telephone Encounter (Signed)
Lmom to call our office to schedule a Abd Aorta u/s (6 mth f/u).

## 2015-07-17 ENCOUNTER — Other Ambulatory Visit: Payer: Self-pay | Admitting: Family Medicine

## 2015-07-24 ENCOUNTER — Other Ambulatory Visit: Payer: Self-pay | Admitting: Family Medicine

## 2015-07-27 ENCOUNTER — Other Ambulatory Visit: Payer: Self-pay | Admitting: Family Medicine

## 2015-07-27 MED ORDER — ALBUTEROL SULFATE HFA 108 (90 BASE) MCG/ACT IN AERS: 2.0000 | INHALATION_SPRAY | Freq: Four times a day (QID) | RESPIRATORY_TRACT | Status: DC | PRN

## 2015-08-04 ENCOUNTER — Other Ambulatory Visit: Payer: Self-pay | Admitting: Internal Medicine

## 2015-08-04 ENCOUNTER — Other Ambulatory Visit: Payer: Self-pay | Admitting: Family Medicine

## 2015-08-04 ENCOUNTER — Encounter: Payer: Self-pay | Admitting: *Deleted

## 2015-08-07 ENCOUNTER — Other Ambulatory Visit: Payer: Self-pay | Admitting: Internal Medicine

## 2015-08-07 DIAGNOSIS — I714 Abdominal aortic aneurysm, without rupture, unspecified: Secondary | ICD-10-CM

## 2015-08-26 ENCOUNTER — Ambulatory Visit: Payer: Medicare Other

## 2015-08-26 DIAGNOSIS — I714 Abdominal aortic aneurysm, without rupture, unspecified: Secondary | ICD-10-CM

## 2015-10-23 ENCOUNTER — Other Ambulatory Visit: Payer: Self-pay | Admitting: Family Medicine

## 2015-10-23 NOTE — Telephone Encounter (Signed)
Refill appropriate and filled per protocol. 

## 2015-10-30 ENCOUNTER — Other Ambulatory Visit: Payer: Self-pay | Admitting: Family Medicine

## 2015-10-30 NOTE — Telephone Encounter (Signed)
Refill appropriate and filled per protocol. 

## 2015-11-10 ENCOUNTER — Encounter (HOSPITAL_COMMUNITY): Payer: Self-pay | Admitting: Emergency Medicine

## 2015-11-10 ENCOUNTER — Emergency Department (HOSPITAL_COMMUNITY): Payer: Medicare Other

## 2015-11-10 ENCOUNTER — Observation Stay (HOSPITAL_COMMUNITY): Payer: Medicare Other

## 2015-11-10 ENCOUNTER — Inpatient Hospital Stay (HOSPITAL_COMMUNITY)
Admission: EM | Admit: 2015-11-10 | Discharge: 2015-12-01 | DRG: 870 | Disposition: A | Discharge status: Skilled Nursing Facility | Payer: Medicare Other | Attending: Internal Medicine | Admitting: Internal Medicine

## 2015-11-10 DIAGNOSIS — I4901 Ventricular fibrillation: Secondary | ICD-10-CM | POA: Diagnosis not present

## 2015-11-10 DIAGNOSIS — N400 Enlarged prostate without lower urinary tract symptoms: Secondary | ICD-10-CM | POA: Diagnosis present

## 2015-11-10 DIAGNOSIS — A419 Sepsis, unspecified organism: Secondary | ICD-10-CM | POA: Diagnosis not present

## 2015-11-10 DIAGNOSIS — G9340 Encephalopathy, unspecified: Secondary | ICD-10-CM

## 2015-11-10 DIAGNOSIS — J189 Pneumonia, unspecified organism: Secondary | ICD-10-CM

## 2015-11-10 DIAGNOSIS — N183 Chronic kidney disease, stage 3 (moderate): Secondary | ICD-10-CM | POA: Diagnosis present

## 2015-11-10 DIAGNOSIS — I4729 Other ventricular tachycardia: Secondary | ICD-10-CM | POA: Insufficient documentation

## 2015-11-10 DIAGNOSIS — I959 Hypotension, unspecified: Secondary | ICD-10-CM | POA: Diagnosis present

## 2015-11-10 DIAGNOSIS — Z950 Presence of cardiac pacemaker: Secondary | ICD-10-CM

## 2015-11-10 DIAGNOSIS — J1100 Influenza due to unidentified influenza virus with unspecified type of pneumonia: Secondary | ICD-10-CM

## 2015-11-10 DIAGNOSIS — I469 Cardiac arrest, cause unspecified: Secondary | ICD-10-CM

## 2015-11-10 DIAGNOSIS — B955 Unspecified streptococcus as the cause of diseases classified elsewhere: Secondary | ICD-10-CM | POA: Insufficient documentation

## 2015-11-10 DIAGNOSIS — I4891 Unspecified atrial fibrillation: Secondary | ICD-10-CM | POA: Diagnosis present

## 2015-11-10 DIAGNOSIS — R7881 Bacteremia: Secondary | ICD-10-CM

## 2015-11-10 DIAGNOSIS — R05 Cough: Secondary | ICD-10-CM

## 2015-11-10 DIAGNOSIS — Z978 Presence of other specified devices: Secondary | ICD-10-CM

## 2015-11-10 DIAGNOSIS — J90 Pleural effusion, not elsewhere classified: Secondary | ICD-10-CM

## 2015-11-10 DIAGNOSIS — Z87891 Personal history of nicotine dependence: Secondary | ICD-10-CM

## 2015-11-10 DIAGNOSIS — D6959 Other secondary thrombocytopenia: Secondary | ICD-10-CM | POA: Diagnosis present

## 2015-11-10 DIAGNOSIS — Z951 Presence of aortocoronary bypass graft: Secondary | ICD-10-CM

## 2015-11-10 DIAGNOSIS — I272 Other secondary pulmonary hypertension: Secondary | ICD-10-CM | POA: Diagnosis present

## 2015-11-10 DIAGNOSIS — Z7901 Long term (current) use of anticoagulants: Secondary | ICD-10-CM

## 2015-11-10 DIAGNOSIS — A409 Streptococcal sepsis, unspecified: Principal | ICD-10-CM | POA: Diagnosis present

## 2015-11-10 DIAGNOSIS — R0602 Shortness of breath: Secondary | ICD-10-CM

## 2015-11-10 DIAGNOSIS — Z0189 Encounter for other specified special examinations: Secondary | ICD-10-CM

## 2015-11-10 DIAGNOSIS — D72820 Lymphocytosis (symptomatic): Secondary | ICD-10-CM | POA: Diagnosis not present

## 2015-11-10 DIAGNOSIS — I5023 Acute on chronic systolic (congestive) heart failure: Secondary | ICD-10-CM | POA: Insufficient documentation

## 2015-11-10 DIAGNOSIS — I472 Ventricular tachycardia: Secondary | ICD-10-CM | POA: Insufficient documentation

## 2015-11-10 DIAGNOSIS — E872 Acidosis: Secondary | ICD-10-CM | POA: Diagnosis present

## 2015-11-10 DIAGNOSIS — I2510 Atherosclerotic heart disease of native coronary artery without angina pectoris: Secondary | ICD-10-CM | POA: Diagnosis present

## 2015-11-10 DIAGNOSIS — F0390 Unspecified dementia without behavioral disturbance: Secondary | ICD-10-CM | POA: Diagnosis present

## 2015-11-10 DIAGNOSIS — L8990 Pressure ulcer of unspecified site, unspecified stage: Secondary | ICD-10-CM | POA: Insufficient documentation

## 2015-11-10 DIAGNOSIS — G2581 Restless legs syndrome: Secondary | ICD-10-CM | POA: Diagnosis present

## 2015-11-10 DIAGNOSIS — D638 Anemia in other chronic diseases classified elsewhere: Secondary | ICD-10-CM | POA: Diagnosis present

## 2015-11-10 DIAGNOSIS — R059 Cough, unspecified: Secondary | ICD-10-CM

## 2015-11-10 DIAGNOSIS — E785 Hyperlipidemia, unspecified: Secondary | ICD-10-CM | POA: Diagnosis present

## 2015-11-10 DIAGNOSIS — Z66 Do not resuscitate: Secondary | ICD-10-CM | POA: Diagnosis not present

## 2015-11-10 DIAGNOSIS — E43 Unspecified severe protein-calorie malnutrition: Secondary | ICD-10-CM | POA: Diagnosis present

## 2015-11-10 DIAGNOSIS — E876 Hypokalemia: Secondary | ICD-10-CM | POA: Diagnosis not present

## 2015-11-10 DIAGNOSIS — I428 Other cardiomyopathies: Secondary | ICD-10-CM | POA: Diagnosis not present

## 2015-11-10 DIAGNOSIS — N179 Acute kidney failure, unspecified: Secondary | ICD-10-CM | POA: Diagnosis not present

## 2015-11-10 DIAGNOSIS — I130 Hypertensive heart and chronic kidney disease with heart failure and stage 1 through stage 4 chronic kidney disease, or unspecified chronic kidney disease: Secondary | ICD-10-CM | POA: Diagnosis present

## 2015-11-10 DIAGNOSIS — Z7951 Long term (current) use of inhaled steroids: Secondary | ICD-10-CM

## 2015-11-10 DIAGNOSIS — K219 Gastro-esophageal reflux disease without esophagitis: Secondary | ICD-10-CM | POA: Diagnosis present

## 2015-11-10 DIAGNOSIS — J9601 Acute respiratory failure with hypoxia: Secondary | ICD-10-CM

## 2015-11-10 DIAGNOSIS — J440 Chronic obstructive pulmonary disease with acute lower respiratory infection: Secondary | ICD-10-CM | POA: Diagnosis present

## 2015-11-10 DIAGNOSIS — J111 Influenza due to unidentified influenza virus with other respiratory manifestations: Secondary | ICD-10-CM

## 2015-11-10 DIAGNOSIS — Z79899 Other long term (current) drug therapy: Secondary | ICD-10-CM

## 2015-11-10 DIAGNOSIS — Z681 Body mass index (BMI) 19 or less, adult: Secondary | ICD-10-CM

## 2015-11-10 DIAGNOSIS — I9589 Other hypotension: Secondary | ICD-10-CM

## 2015-11-10 DIAGNOSIS — I5021 Acute systolic (congestive) heart failure: Secondary | ICD-10-CM

## 2015-11-10 DIAGNOSIS — J9690 Respiratory failure, unspecified, unspecified whether with hypoxia or hypercapnia: Secondary | ICD-10-CM

## 2015-11-10 DIAGNOSIS — R739 Hyperglycemia, unspecified: Secondary | ICD-10-CM | POA: Diagnosis not present

## 2015-11-10 LAB — CBC WITH DIFFERENTIAL/PLATELET
BASOS ABS: 0 10*3/uL (ref 0.0–0.1)
BASOS ABS: 0 10*3/uL (ref 0.0–0.1)
BASOS PCT: 0 %
Basophils Relative: 0 %
EOS ABS: 0 10*3/uL (ref 0.0–0.7)
EOS PCT: 0 %
Eosinophils Absolute: 0 10*3/uL (ref 0.0–0.7)
Eosinophils Relative: 0 %
HCT: 31.8 % — ABNORMAL LOW (ref 39.0–52.0)
HEMATOCRIT: 29.9 % — AB (ref 39.0–52.0)
HEMOGLOBIN: 10 g/dL — AB (ref 13.0–17.0)
HEMOGLOBIN: 9.3 g/dL — AB (ref 13.0–17.0)
LYMPHS ABS: 8.7 10*3/uL — AB (ref 0.7–4.0)
LYMPHS PCT: 62 %
Lymphocytes Relative: 62 %
Lymphs Abs: 6.7 10*3/uL — ABNORMAL HIGH (ref 0.7–4.0)
MCH: 28.1 pg (ref 26.0–34.0)
MCH: 28.1 pg (ref 26.0–34.0)
MCHC: 31.4 g/dL (ref 30.0–36.0)
MCHC: 31.1 g/dL (ref 30.0–36.0)
MCV: 89.3 fL (ref 78.0–100.0)
MCV: 90.3 fL (ref 78.0–100.0)
MONO ABS: 0.6 10*3/uL (ref 0.1–1.0)
MONOS PCT: 2 %
Monocytes Absolute: 0.2 10*3/uL (ref 0.1–1.0)
Monocytes Relative: 4 %
NEUTROS ABS: 3.9 10*3/uL (ref 1.7–7.7)
NEUTROS PCT: 36 %
Neutro Abs: 4.8 10*3/uL (ref 1.7–7.7)
Neutrophils Relative %: 34 %
Platelets: 51 10*3/uL — ABNORMAL LOW (ref 150–400)
Platelets: 50 10*3/uL — ABNORMAL LOW (ref 150–400)
RBC: 3.56 MIL/uL — AB (ref 4.22–5.81)
RBC: 3.31 MIL/uL — ABNORMAL LOW (ref 4.22–5.81)
RDW: 18.2 % — ABNORMAL HIGH (ref 11.5–15.5)
RDW: 18.6 % — AB (ref 11.5–15.5)
Smear Review: DECREASED
WBC: 14.1 10*3/uL — AB (ref 4.0–10.5)
WBC: 10.8 10*3/uL — ABNORMAL HIGH (ref 4.0–10.5)

## 2015-11-10 LAB — COMPREHENSIVE METABOLIC PANEL
ALK PHOS: 60 U/L (ref 38–126)
ALK PHOS: 63 U/L (ref 38–126)
ALT: 11 U/L — AB (ref 17–63)
ALT: 13 U/L — ABNORMAL LOW (ref 17–63)
AST: 24 U/L (ref 15–41)
AST: 35 U/L (ref 15–41)
Albumin: 3.6 g/dL (ref 3.5–5.0)
Albumin: 3.3 g/dL — ABNORMAL LOW (ref 3.5–5.0)
Anion gap: 12 (ref 5–15)
Anion gap: 12 (ref 5–15)
BILIRUBIN TOTAL: 1.4 mg/dL — AB (ref 0.3–1.2)
BUN: 19 mg/dL (ref 6–20)
BUN: 21 mg/dL — AB (ref 6–20)
CALCIUM: 9.2 mg/dL (ref 8.9–10.3)
CALCIUM: 8.8 mg/dL — AB (ref 8.9–10.3)
CO2: 24 mmol/L (ref 22–32)
CO2: 23 mmol/L (ref 22–32)
CREATININE: 1.59 mg/dL — AB (ref 0.61–1.24)
Chloride: 106 mmol/L (ref 101–111)
Chloride: 107 mmol/L (ref 101–111)
Creatinine, Ser: 1.62 mg/dL — ABNORMAL HIGH (ref 0.61–1.24)
GFR calc Af Amer: 42 mL/min — ABNORMAL LOW (ref >60)
GFR, EST AFRICAN AMERICAN: 43 mL/min — AB (ref >60)
GFR, EST NON AFRICAN AMERICAN: 37 mL/min — AB (ref >60)
GFR, EST NON AFRICAN AMERICAN: 36 mL/min — AB (ref >60)
GLUCOSE: 200 mg/dL — AB (ref 65–99)
Glucose, Bld: 155 mg/dL — ABNORMAL HIGH (ref 65–99)
POTASSIUM: 4.2 mmol/L (ref 3.5–5.1)
Potassium: 4.1 mmol/L (ref 3.5–5.1)
Sodium: 142 mmol/L (ref 135–145)
Sodium: 142 mmol/L (ref 135–145)
TOTAL PROTEIN: 6 g/dL — AB (ref 6.5–8.1)
TOTAL PROTEIN: 5.7 g/dL — AB (ref 6.5–8.1)
Total Bilirubin: 1.1 mg/dL (ref 0.3–1.2)

## 2015-11-10 LAB — URINE MICROSCOPIC-ADD ON

## 2015-11-10 LAB — BRAIN NATRIURETIC PEPTIDE: B Natriuretic Peptide: 1792.8 pg/mL — ABNORMAL HIGH (ref 0.0–100.0)

## 2015-11-10 LAB — INFLUENZA PANEL BY PCR (TYPE A & B)
H1N1 flu by pcr: NOT DETECTED
INFLAPCR: POSITIVE — AB
Influenza B By PCR: NEGATIVE

## 2015-11-10 LAB — AMMONIA: AMMONIA: 25 umol/L (ref 9–35)

## 2015-11-10 LAB — PROCALCITONIN: Procalcitonin: 2.14 ng/mL

## 2015-11-10 LAB — URINALYSIS, ROUTINE W REFLEX MICROSCOPIC
BILIRUBIN URINE: NEGATIVE
Glucose, UA: NEGATIVE mg/dL
Ketones, ur: NEGATIVE mg/dL
Leukocytes, UA: NEGATIVE
Nitrite: NEGATIVE
Protein, ur: 100 mg/dL — AB
pH: 5 (ref 5.0–8.0)

## 2015-11-10 LAB — I-STAT CG4 LACTIC ACID, ED
LACTIC ACID, VENOUS: 1.36 mmol/L (ref 0.5–2.0)
Lactic Acid, Venous: 2.64 mmol/L (ref 0.5–2.0)

## 2015-11-10 LAB — TSH: TSH: 0.556 u[IU]/mL (ref 0.350–4.500)

## 2015-11-10 LAB — VITAMIN B12: VITAMIN B 12: 436 pg/mL (ref 180–914)

## 2015-11-10 LAB — PROTIME-INR
INR: 1.35 (ref 0.00–1.49)
Prothrombin Time: 16.8 seconds — ABNORMAL HIGH (ref 11.6–15.2)

## 2015-11-10 LAB — APTT: aPTT: 32 seconds (ref 24–37)

## 2015-11-10 LAB — LACTIC ACID, PLASMA: LACTIC ACID, VENOUS: 2.6 mmol/L — AB (ref 0.5–2.0)

## 2015-11-10 MED ORDER — DEXTROSE 5 % IV SOLN
500.0000 mg | Freq: Once | INTRAVENOUS | Status: AC
  Administered 2015-11-10: 500 mg via INTRAVENOUS
  Filled 2015-11-10: qty 500

## 2015-11-10 MED ORDER — DEXTROSE 5 % IV SOLN
1.0000 g | Freq: Once | INTRAVENOUS | Status: AC
  Administered 2015-11-10: 1 g via INTRAVENOUS
  Filled 2015-11-10: qty 10

## 2015-11-10 MED ORDER — ACETAMINOPHEN 325 MG PO TABS
650.0000 mg | ORAL_TABLET | Freq: Once | ORAL | Status: AC
  Administered 2015-11-10: 650 mg via ORAL
  Filled 2015-11-10: qty 2

## 2015-11-10 MED ORDER — DEXTROSE 5 % IV SOLN
500.0000 mg | INTRAVENOUS | Status: DC
  Administered 2015-11-11: 500 mg via INTRAVENOUS
  Filled 2015-11-10: qty 500

## 2015-11-10 MED ORDER — SODIUM CHLORIDE 0.9% FLUSH: 3.0000 mL | INTRAVENOUS | Status: DC | PRN

## 2015-11-10 MED ORDER — SODIUM CHLORIDE 0.9 % IV SOLN: 250.0000 mL | INTRAVENOUS | Status: DC | PRN

## 2015-11-10 MED ORDER — METHYLPREDNISOLONE SODIUM SUCC 125 MG IJ SOLR
125.0000 mg | Freq: Once | INTRAMUSCULAR | Status: AC
  Administered 2015-11-10: 125 mg via INTRAVENOUS
  Filled 2015-11-10: qty 2

## 2015-11-10 MED ORDER — DEXTROSE 5 % IV SOLN
1.0000 g | INTRAVENOUS | Status: DC
  Administered 2015-11-11: 1 g via INTRAVENOUS
  Filled 2015-11-10: qty 10

## 2015-11-10 MED ORDER — OSELTAMIVIR PHOSPHATE 6 MG/ML PO SUSR
75.0000 mg | Freq: Two times a day (BID) | ORAL | Status: DC
  Administered 2015-11-10 – 2015-11-11 (×3): 75 mg via ORAL
  Filled 2015-11-10 (×3): qty 12.5

## 2015-11-10 MED ORDER — SODIUM CHLORIDE 0.9 % IV BOLUS (SEPSIS)
500.0000 mL | INTRAVENOUS | Status: AC
  Administered 2015-11-10: 500 mL via INTRAVENOUS

## 2015-11-10 MED ORDER — SODIUM CHLORIDE 0.9 % IV BOLUS (SEPSIS)
1000.0000 mL | INTRAVENOUS | Status: AC
Start: 2015-11-10 — End: 2015-11-10
  Administered 2015-11-10: 1000 mL via INTRAVENOUS

## 2015-11-10 MED ORDER — SODIUM CHLORIDE 0.9% FLUSH
3.0000 mL | Freq: Two times a day (BID) | INTRAVENOUS | Status: DC
  Administered 2015-11-10 (×2): 3 mL via INTRAVENOUS

## 2015-11-10 MED ORDER — IPRATROPIUM-ALBUTEROL 0.5-2.5 (3) MG/3ML IN SOLN
3.0000 mL | RESPIRATORY_TRACT | Status: AC
Start: 2015-11-10 — End: 2015-11-10
  Administered 2015-11-10: 3 mL via RESPIRATORY_TRACT
  Filled 2015-11-10: qty 3

## 2015-11-10 NOTE — ED Notes (Signed)
Pt stood up , got out of bed, IV pulled out-- assisted back into bed, pt is able to alert to person and place.

## 2015-11-10 NOTE — Progress Notes (Signed)
Received patient from ED accompanied by NT and patient Sister Inez Catalina. Stable on oxygen at 2l Denies chest pain or SOB. Oriented to self  And place.Bed alarm on.

## 2015-11-10 NOTE — H&P (Signed)
[facility] History and Physical  [name] [name] [id number] DOB: 1925-12-15 DOA: 11/10/2015  Referring physician:  PCP: Odette Fraction, MD   Chief Complaint:  HPI: [name] [name] is a 82 y.o. male  With a history of chronic leukocytosis on observation, COPD brought to the ED with generalized weakness, and confusion per family report. He had  fevers up to 102,  and brown productive cough. No hemoptysis. No recent pneumonia. He was exposed to Flu last week though sister. No chest pain. No  nausea, vomiting or diarrhea. At the time of the visit he is somewhat confused, removing  His IV lines. Sister reports that he is "a little worse than usual" but he has chronic dementia. [name], who lives with patient, reports patient having left eye droop yesterday, with numb tongue that never went away, disoriented, and with an episode of soiling himself"    At the ED, he is febrile at 102.2, BP normal, tachycardic, Osats 97 on RA, normal on O2. Chest x-ray is negative for acute cardiopulmonary disease. Cultures pending. WBC is elevated at 14.4. Lactic acid 1.36. otherwise BMET normal  Flu PCR pending. UA negative. Cultures pending. He was placed prophilactically on Azithromycin and Rocephin, given IVF, antipyretics, nebs and steroids   Review of Systems: limites due to mild confusion  Past Medical History  Diagnosis Date  . CAD (coronary artery disease)   . Hypertension   . Vitamin D deficiency   . Chronic back pain   . Anemia   . ED (erectile dysfunction)   . Arthritis   . RLS (restless legs syndrome)   . GERD (gastroesophageal reflux disease)   . COPD (chronic obstructive pulmonary disease) ([place])   . Hyperlipidemia   . Atrial fibrillation ([place])   . Second degree Mobitz II AV block     s/p PPM  . Asthma 09/15/2011  . Long term current use of anticoagulant 09/15/2011  . Thrombocytopenia ([place]) 09/15/2011  . Lymphocytosis   . CHF (congestive heart failure) ([place]) 01/01/2013  .  Allergy    Past Surgical History  Procedure Laterality Date  . Coronary artery bypass graft    . Cataract extraction    . Pacemaker insertion     Social History:  reports that he quit smoking about 7 years ago. His smoking use included Cigarettes. He has a 240 pack-year smoking history. He has never used smokeless tobacco. He reports that he does not drink alcohol or use illicit drugs.  Allergies  Allergen Reactions  . Amoxicillin Itching and Rash  . Penicillins Itching and Rash    Has patient had a PCN reaction causing immediate rash, facial/tongue/throat swelling, SOB or lightheadedness with hypotension: Yes Has patient had a PCN reaction causing severe rash involving mucus membranes or skin necrosis: No Has patient had a PCN reaction that required hospitalization No Has patient had a PCN reaction occurring within the last 10 years: No If all of the above answers are "NO", then may proceed with Cephalosporin use.     Family History  Problem Relation Age of Onset  . Diabetes Sister   . Cancer Mother     lung cancer  . Stroke Father   . Cancer Brother     brain cancer  . Alcohol abuse Brother      Prior to Admission medications   Medication Sig Start Date End Date Taking? Authorizing Provider  albuterol (PROVENTIL HFA;VENTOLIN HFA) 108 (90 BASE) MCG/ACT inhaler Inhale 2 puffs into the lungs every 6 (six)  hours as needed. For wheezing or shortness of breath 07/07/14  Yes Susy Frizzle, MD  cyclobenzaprine (FLEXERIL) 5 MG tablet Take 1 tablet (5 mg total) by mouth 3 (three) times daily as needed for muscle spasms. 07/07/14  Yes Susy Frizzle, MD  donepezil (ARICEPT) 5 MG tablet Take 1 tablet (5 mg total) by mouth at bedtime. 01/09/15  Yes Susy Frizzle, MD  FLONASE 50 MCG/ACT nasal spray Place 1 spray into both nostrils daily. 01/12/15 01/12/16 Yes Earleen Newport, MD  furosemide (LASIX) 40 MG tablet Take 1 tablet (40 mg total) by mouth daily. 11/18/14  Yes Deboraha Sprang, MD   gabapentin (NEURONTIN) 100 MG capsule TAKE TWO CAPSULES BY MOUTH AT BEDTIME 08/05/15  Yes Susy Frizzle, MD  isosorbide mononitrate (IMDUR) 30 MG 24 hr tablet Take 0.5 tablets (15 mg total) by mouth every morning. 01/05/15  Yes Deboraha Sprang, MD  Multiple Vitamin (MULTIVITAMIN) tablet Take 1 tablet by mouth every morning.    Yes Historical Provider, MD  pantoprazole (PROTONIX) 40 MG tablet Take 1 tablet (40 mg total) by mouth every morning. 07/13/15  Yes Susy Frizzle, MD  rOPINIRole (REQUIP) 4 MG tablet Take 1 tablet (4 mg total) by mouth at bedtime. 01/09/15  Yes Susy Frizzle, MD  simvastatin (ZOCOR) 10 MG tablet TAKE ONE TABLET BY MOUTH ONCE DAILY AT 6 PM. 10/30/15  Yes Susy Frizzle, MD  tamsulosin (FLOMAX) 0.4 MG CAPS capsule TAKE ONE CAPSULE BY MOUTH ONCE DAILY 04/27/15  Yes Susy Frizzle, MD  XARELTO 15 MG TABS tablet TAKE ONE TABLET BY MOUTH ONCE DAILY WITH SUPPER 08/05/15  Yes Deboraha Sprang, MD  albuterol (PROVENTIL HFA;VENTOLIN HFA) 108 (90 BASE) MCG/ACT inhaler Inhale 2 puffs into the lungs every 6 (six) hours as needed for wheezing or shortness of breath. Patient not taking: Reported on 11/10/2015 07/27/15   Susy Frizzle, MD  ipratropium (ATROVENT) 0.06 % nasal spray Place 2 sprays into both nostrils 4 (four) times daily. Patient not taking: Reported on 11/10/2015 03/30/15   Susy Frizzle, MD  pantoprazole (PROTONIX) 40 MG tablet TAKE ONE TABLET BY MOUTH EVERY MORNING Patient not taking: Reported on 11/10/2015 07/20/15   Susy Frizzle, MD  tiotropium (SPIRIVA HANDIHALER) 18 MCG inhalation capsule Place 1 capsule (18 mcg total) into inhaler and inhale daily. Patient not taking: Reported on 11/10/2015 08/07/14   Susy Frizzle, MD   Physical Exam: Filed Vitals:   11/10/15 1045 11/10/15 1100 11/10/15 1115 11/10/15 1130  BP:   122/78 135/123  Pulse: 72 47 83 68  Temp:      TempSrc:      Resp: 35 15 17 22   SpO2: 98% 94% 98% 97%    Wt Readings from Last 3 Encounters:    06/02/15 68.947 kg (152 lb)  03/30/15 70.761 kg (156 lb)  02/26/15 69.854 kg (154 lb)    General: Appears calm and comfortable Eyes:  PERRL, EOMI, normal lids, iris ENT: grossly normal hearing, lips & tongue Neck: no lymphadenopathy, masses or thyromegaly Cardiovascular: regular rate and rythm, no murmurs, rubs or gallops. No lower extremity edema   Respiratory: clear to auscultation bilaterally, no wheezing, rhonhci or rales. Normal respiratory effort. Abdomen: soft,non-tender, normal bowel sounds Skin: no rash or induration seen on limited exam. No open lesions. Musculoskeletal:  grossly normal tone in both upper and lower extremities Psychiatric: grossly normal mood and affect, speech fluent and appropriate Neurologic: CN 2-12 grossly intact, moves  all extremities in coordinated fashion.          Labs on Admission:  Basic Metabolic Panel:  Recent Labs Lab 11/10/15 0910  NA 142  K 4.1  CL 106  CO2 24  GLUCOSE 155*  BUN 19  CREATININE 1.59*  CALCIUM 9.2    Liver Function Tests:  Recent Labs Lab 11/10/15 0910  AST 24  ALT 11*  ALKPHOS 60  BILITOT 1.1  PROT 6.0*  ALBUMIN 3.6   No results for input(s): LIPASE, AMYLASE in the last 168 hours. No results for input(s): AMMONIA in the last 168 hours.  CBC:  Recent Labs Lab 11/10/15 0910  WBC 14.1*  NEUTROABS 4.8  HGB 10.0*  HCT 31.8*  MCV 89.3  PLT 51*    Cardiac Enzymes: No results for input(s): CKTOTAL, CKMB, CKMBINDEX, TROPONINI in the last 168 hours.   Radiological Exams on Admission: Dg Chest 2 View  11/10/2015  CLINICAL DATA:  Weakness and difficulty walking since 4 p.m. yesterday. Initial encounter. EXAM: CHEST  2 VIEW COMPARISON:  PA and lateral chest 12/24/2012. CT chest, abdomen and pelvis 10/24/2012. FINDINGS: There is cardiomegaly without edema. The chest is hyperexpanded. Minimal left basilar atelectasis is noted. No pneumothorax or pleural effusion is seen. No focal bony abnormality is  identified. The patient is status post CABG with a pacing device in place. Remote left rib fractures are identified. IMPRESSION: Cardiomegaly without edema. Mild left basilar atelectasis. Emphysema. Electronically Signed   By: Inge Rise M.D.   On: 11/10/2015 08:40      Assessment/Plan Active Problems:   Sepsis ([place])   Physical deconditioning likely due to viral illness versus pneumonia in the setting of COPD Presenting with fevers up to 102, cough with brown sputum  and congestion  CXR  today is negative for acute disease. Placed on  Azithromycin and Rocephin, received nebs and steroids.Patient meets sepsis criteria based on vital sign changes and initial  hypoxia. Patient's acute respiratory failure likely secondary to viral illness such as flu or pneumonia which is not seen on chest x-ray at this point time. Cultures pending. WBC is elevated at 14.4. Lactic acid 1.36. otherwise BMET normal  Flu PCR pending. UA negative. -Will admit to obs med-surg for evaluation and management of symptoms  May need to initiate Tamiflu pending on Flu PCR  Continue antibiotics, change to Levaquin and Vanco per Pharmacy per PNA order set recommendation and due to allergy to Rocephin  -Oxygen as needed  Sputum cultures Respiratory Viral panel -IV Nebulizers with Xopenex q6 hrs and Albuterol every 2 hrs prn  -Mucinex as needed  -IV fluids  -antipyretics  Acute encephalopathy in a patient with chronic dementia, viral versus bacterial. Febrile. No metabolic derangement.UA normal . Level V Caveat.  Will continue to monitor for now Continue IVF, antipyretics and await cultures results CT head  Ammonia levels,TSH, B12, RPR  Chronic Leukocytosis,at baseline, current WBC 14.1. CXR negative  -  abx , and cultures as above -  Repeat CBC  in AM  Chronic thrombocytopenia, due to chronic leukocytosis as above, suspicious for CLL on observation. Current plt count 51 k, baseline 100, no bleeding issues  noted.  No transfusion indicated at this time Hold Heparin products Repeat CBC ibn am   Diastolic CHF, last 2 D echo 2010 w Grade 1 DD EF 55% Repeat 2 D echo  Careful not to overload with fluids   Dementia, acute on chronic Continue Aricept  as above   Code Status:  Full Code  DVT Prophylaxis: SCDs Family Communication: at bedside Disposition Plan: Pending Improvement. Admitted for observation in tele bed. Expected LOS 24-48 hrs    [facility] E,PA-C [facility] [email or web address] Password [email or web address]

## 2015-11-10 NOTE — ED Notes (Signed)
[name] [name] [phone]  Sister== pt lives with sister.

## 2015-11-10 NOTE — ED Notes (Signed)
Per [facility] initially called out to possible stroke.  Patient complains of weakness and difficulty walking since yesterday at 1600, patient is normally independently ambulatory.  EMS states patient was dyspneic, lethargic, and not answering questions PTA to hospital.  On arrival to hospital, patient following commands and breathing normally.

## 2015-11-10 NOTE — ED Provider Notes (Signed)
CSN: [id number]     Arrival date & time 11/10/15  0746 History   First MD Initiated Contact with Patient 11/10/15 [phone]     Chief Complaint  Patient presents with  . Weakness     (Consider location/radiation/quality/duration/timing/severity/associated sxs/prior Treatment) HPI Comments: Lived with sister x54yrs, functional, ambulates normally, doesn't seem confused however yesterday AM he couldn't eat breakfast Thought that left eye seemed droopy yesterday and that he couldn't coordinate enough to swallow versus had generalized weakness Cough at baseline, wasn't concerned regarding any specific infectious symptoms More altered this AM, generalized weakness, not walking or standing 2 days of generalized weakness, AMS    Patient is a 82 y.o. male presenting with weakness.  Weakness Pertinent negatives include no chest pain, no abdominal pain, no headaches and no shortness of breath (although sister thinks he might have).    Past Medical History  Diagnosis Date  . CAD (coronary artery disease)   . Hypertension   . Vitamin D deficiency   . Chronic back pain   . Anemia   . ED (erectile dysfunction)   . Arthritis   . RLS (restless legs syndrome)   . GERD (gastroesophageal reflux disease)   . COPD (chronic obstructive pulmonary disease) ([place])   . Hyperlipidemia   . Atrial fibrillation ([place])   . Second degree Mobitz II AV block     s/p PPM  . Asthma 09/15/2011  . Long term current use of anticoagulant 09/15/2011  . Thrombocytopenia ([place]) 09/15/2011  . Lymphocytosis   . CHF (congestive heart failure) ([place]) 01/01/2013  . Allergy    Past Surgical History  Procedure Laterality Date  . Coronary artery bypass graft    . Cataract extraction    . Pacemaker insertion     Family History  Problem Relation Age of Onset  . Diabetes Sister   . Cancer Mother     lung cancer  . Stroke Father   . Cancer Brother     brain cancer  . Alcohol abuse Brother    Social History  Substance Use  Topics  . Smoking status: Former Smoker -- 3.00 packs/day for 80 years    Types: Cigarettes    Quit date: 09/05/2008  . Smokeless tobacco: Never Used  . Alcohol Use: No     Comment: occasional 4 drinks per week    Review of Systems  Constitutional: Negative for fever (denies at home, however was present on arrival to ED).  HENT: Positive for congestion.   Eyes: Negative for visual disturbance.  Respiratory: Positive for cough. Negative for shortness of breath (although sister thinks he might have).   Cardiovascular: Negative for chest pain.  Gastrointestinal: Negative for nausea, vomiting and abdominal pain.  Genitourinary: Negative for dysuria.  Musculoskeletal: Negative for back pain.  Skin: Negative for rash.  Neurological: Positive for weakness. Negative for syncope, facial asymmetry, light-headedness, numbness and headaches.  Psychiatric/Behavioral: Positive for confusion.      Allergies  Amoxicillin and Penicillins  Home Medications   Prior to Admission medications   Medication Sig Start Date End Date Taking? Authorizing Provider  albuterol (PROVENTIL HFA;VENTOLIN HFA) 108 (90 BASE) MCG/ACT inhaler Inhale 2 puffs into the lungs every 6 (six) hours as needed. For wheezing or shortness of breath 07/07/14  Yes Susy Frizzle, MD  cyclobenzaprine (FLEXERIL) 5 MG tablet Take 1 tablet (5 mg total) by mouth 3 (three) times daily as needed for muscle spasms. 07/07/14  Yes Susy Frizzle, MD  donepezil (ARICEPT) 5 MG  tablet Take 1 tablet (5 mg total) by mouth at bedtime. 01/09/15  Yes Susy Frizzle, MD  FLONASE 50 MCG/ACT nasal spray Place 1 spray into both nostrils daily. 01/12/15 01/12/16 Yes Earleen Newport, MD  furosemide (LASIX) 40 MG tablet Take 1 tablet (40 mg total) by mouth daily. 11/18/14  Yes Deboraha Sprang, MD  gabapentin (NEURONTIN) 100 MG capsule TAKE TWO CAPSULES BY MOUTH AT BEDTIME 08/05/15  Yes Susy Frizzle, MD  isosorbide mononitrate (IMDUR) 30 MG 24 hr  tablet Take 0.5 tablets (15 mg total) by mouth every morning. 01/05/15  Yes Deboraha Sprang, MD  Multiple Vitamin (MULTIVITAMIN) tablet Take 1 tablet by mouth every morning.    Yes Historical Provider, MD  pantoprazole (PROTONIX) 40 MG tablet Take 1 tablet (40 mg total) by mouth every morning. 07/13/15  Yes Susy Frizzle, MD  rOPINIRole (REQUIP) 4 MG tablet Take 1 tablet (4 mg total) by mouth at bedtime. 01/09/15  Yes Susy Frizzle, MD  simvastatin (ZOCOR) 10 MG tablet TAKE ONE TABLET BY MOUTH ONCE DAILY AT 6 PM. 10/30/15  Yes Susy Frizzle, MD  tamsulosin (FLOMAX) 0.4 MG CAPS capsule TAKE ONE CAPSULE BY MOUTH ONCE DAILY 04/27/15  Yes Susy Frizzle, MD  XARELTO 15 MG TABS tablet TAKE ONE TABLET BY MOUTH ONCE DAILY WITH SUPPER 08/05/15  Yes Deboraha Sprang, MD  albuterol (PROVENTIL HFA;VENTOLIN HFA) 108 (90 BASE) MCG/ACT inhaler Inhale 2 puffs into the lungs every 6 (six) hours as needed for wheezing or shortness of breath. Patient not taking: Reported on 11/10/2015 07/27/15   Susy Frizzle, MD  ipratropium (ATROVENT) 0.06 % nasal spray Place 2 sprays into both nostrils 4 (four) times daily. Patient not taking: Reported on 11/10/2015 03/30/15   Susy Frizzle, MD  pantoprazole (PROTONIX) 40 MG tablet TAKE ONE TABLET BY MOUTH EVERY MORNING Patient not taking: Reported on 11/10/2015 07/20/15   Susy Frizzle, MD  tiotropium (SPIRIVA HANDIHALER) 18 MCG inhalation capsule Place 1 capsule (18 mcg total) into inhaler and inhale daily. Patient not taking: Reported on 11/10/2015 08/07/14   Susy Frizzle, MD   BP 109/66 mmHg  Pulse 60  Temp(Src) 97.8 F (36.6 C) (Oral)  Resp 24  SpO2 100% Physical Exam  Constitutional: He appears well-developed and well-nourished. No distress.  HENT:  Head: Normocephalic and atraumatic.  Eyes: Conjunctivae and EOM are normal.  Neck: Normal range of motion.  Cardiovascular: Normal rate, regular rhythm, normal heart sounds and intact distal pulses.  Exam reveals  no gallop and no friction rub.   No murmur heard. Pulmonary/Chest: Effort normal. Tachypnea noted. No respiratory distress. He has wheezes. He has no rales.  Abdominal: Soft. He exhibits no distension. There is no tenderness. There is no guarding.  Musculoskeletal: He exhibits no edema.  Neurological: He is alert. He has normal strength. No cranial nerve deficit or sensory deficit. Coordination normal. GCS eye subscore is 4. GCS verbal subscore is 4. GCS motor subscore is 6.  Oriented to self and location  Skin: Skin is warm and dry. No rash noted. He is not diaphoretic.  Nursing note and vitals reviewed.   ED Course  Procedures (including critical care time) Labs Review Labs Reviewed  COMPREHENSIVE METABOLIC PANEL - Abnormal; Notable for the following:    Glucose, Bld 155 (*)    Creatinine, Ser 1.59 (*)    Total Protein 6.0 (*)    ALT 11 (*)    GFR calc non Af  Amer 37 (*)    GFR calc Af Amer 43 (*)    All other components within normal limits  CBC WITH DIFFERENTIAL/PLATELET - Abnormal; Notable for the following:    WBC 14.1 (*)    RBC 3.56 (*)    Hemoglobin 10.0 (*)    HCT 31.8 (*)    RDW 18.2 (*)    Platelets 51 (*)    Lymphs Abs 8.7 (*)    All other components within normal limits  URINALYSIS, ROUTINE W REFLEX MICROSCOPIC (NOT AT [facility]) - Abnormal; Notable for the following:    APPearance CLOUDY (*)    Specific Gravity, Urine >1.030 (*)    Hgb urine dipstick LARGE (*)    Protein, ur 100 (*)    All other components within normal limits  INFLUENZA PANEL BY PCR (TYPE A & B, H1N1) - Abnormal; Notable for the following:    Influenza A By PCR POSITIVE (*)    All other components within normal limits  URINE MICROSCOPIC-ADD ON - Abnormal; Notable for the following:    Squamous Epithelial / LPF 0-5 (*)    Bacteria, UA FEW (*)    Casts HYALINE CASTS (*)    All other components within normal limits  CBC WITH DIFFERENTIAL/PLATELET - Abnormal; Notable for the following:    WBC  10.8 (*)    RBC 3.31 (*)    Hemoglobin 9.3 (*)    HCT 29.9 (*)    RDW 18.6 (*)    Platelets 50 (*)    Lymphs Abs 6.7 (*)    All other components within normal limits  COMPREHENSIVE METABOLIC PANEL - Abnormal; Notable for the following:    Glucose, Bld 200 (*)    BUN 21 (*)    Creatinine, Ser 1.62 (*)    Calcium 8.8 (*)    Total Protein 5.7 (*)    Albumin 3.3 (*)    ALT 13 (*)    Total Bilirubin 1.4 (*)    GFR calc non Af Amer 36 (*)    GFR calc Af Amer 42 (*)    All other components within normal limits  LACTIC ACID, PLASMA - Abnormal; Notable for the following:    Lactic Acid, Venous 2.6 (*)    All other components within normal limits  PROTIME-INR - Abnormal; Notable for the following:    Prothrombin Time 16.8 (*)    All other components within normal limits  BRAIN NATRIURETIC PEPTIDE - Abnormal; Notable for the following:    B Natriuretic Peptide 1792.8 (*)    All other components within normal limits  I-STAT CG4 LACTIC ACID, ED - Abnormal; Notable for the following:    Lactic Acid, Venous 2.64 (*)    All other components within normal limits  CULTURE, BLOOD (ROUTINE X 2)  CULTURE, BLOOD (ROUTINE X 2)  URINE CULTURE  CULTURE, EXPECTORATED SPUTUM-ASSESSMENT  GRAM STAIN  CULTURE, RESPIRATORY (NON-EXPECTORATED)  PROCALCITONIN  APTT  TSH  VITAMIN B12  AMMONIA  HIV ANTIBODY (ROUTINE TESTING)  LEGIONELLA ANTIGEN, URINE  STREP PNEUMONIAE URINARY ANTIGEN  BASIC METABOLIC PANEL  CBC  I-STAT CG4 LACTIC ACID, ED    Imaging Review Dg Chest 2 View  11/10/2015  CLINICAL DATA:  Weakness and difficulty walking since 4 p.m. yesterday. Initial encounter. EXAM: CHEST  2 VIEW COMPARISON:  PA and lateral chest 12/24/2012. CT chest, abdomen and pelvis 10/24/2012. FINDINGS: There is cardiomegaly without edema. The chest is hyperexpanded. Minimal left basilar atelectasis is noted. No pneumothorax or pleural effusion is  seen. No focal bony abnormality is identified. The patient is  status post CABG with a pacing device in place. Remote left rib fractures are identified. IMPRESSION: Cardiomegaly without edema. Mild left basilar atelectasis. Emphysema. Electronically Signed   By: Inge Rise M.D.   On: 11/10/2015 08:40   Dg Chest Port 1 View  11/10/2015  CLINICAL DATA:  Shortness breath, cough for 2-3 days EXAM: PORTABLE CHEST 1 VIEW COMPARISON:  11/10/2015 FINDINGS: There is mild left basilar scarring. There is no focal consolidation. There is no pleural effusion or pneumothorax. There is stable cardiomegaly. There is prior CABG. There is a cardiac pacemaker present. The osseous structures are unremarkable. IMPRESSION: No active disease. Electronically Signed   By: Kathreen Devoid   On: 11/10/2015 13:43   I have personally reviewed and evaluated these images and lab results as part of my medical decision-making.   EKG Interpretation None      MDM   Final diagnoses:  Influenza with pneumonia  Acute encephalopathy  Sepsis due to pneumonia [facility])   82 year old male with a history coronary artery disease, hypertension, anemia, restless leg syndrome, COPD, hyperlipidemia, atrial fibrillation, CHF who presents with concern for generalized weakness and fever.  Patient febrile on arrival to the emergency department, tachypnea, with cough noted, and initial concern was for possible community acquired pneumonia. Blood cultures were.drawn and patient was given Rocephin and azithromycin. Chest x-ray returned showing possible area of pneumonia versus atelectasis.  Influenza eval sent and pending.  Urinalysis showed no sign of infection.  Patient's neurologic exam is nonfocal, and have low suspicion for CVA or other intracranial cause of patient's symptoms  Patient with wheezing on reevaluation, and was given do not absence Solu-Medrol for additional COPD exacerbation. He'll be admitted to Dr. Barbaraann Faster hospitalist for further care.  Gareth Morgan, MD 11/10/15 [phone]

## 2015-11-11 ENCOUNTER — Other Ambulatory Visit (HOSPITAL_COMMUNITY): Payer: Medicare Other

## 2015-11-11 DIAGNOSIS — F0390 Unspecified dementia without behavioral disturbance: Secondary | ICD-10-CM | POA: Diagnosis present

## 2015-11-11 DIAGNOSIS — Z951 Presence of aortocoronary bypass graft: Secondary | ICD-10-CM | POA: Diagnosis not present

## 2015-11-11 DIAGNOSIS — J948 Other specified pleural conditions: Secondary | ICD-10-CM | POA: Diagnosis not present

## 2015-11-11 DIAGNOSIS — E876 Hypokalemia: Secondary | ICD-10-CM | POA: Diagnosis not present

## 2015-11-11 DIAGNOSIS — E872 Acidosis: Secondary | ICD-10-CM | POA: Diagnosis present

## 2015-11-11 DIAGNOSIS — I4901 Ventricular fibrillation: Secondary | ICD-10-CM | POA: Diagnosis not present

## 2015-11-11 DIAGNOSIS — I428 Other cardiomyopathies: Secondary | ICD-10-CM | POA: Diagnosis not present

## 2015-11-11 DIAGNOSIS — D638 Anemia in other chronic diseases classified elsewhere: Secondary | ICD-10-CM | POA: Diagnosis present

## 2015-11-11 DIAGNOSIS — I482 Chronic atrial fibrillation: Secondary | ICD-10-CM | POA: Diagnosis not present

## 2015-11-11 DIAGNOSIS — Z66 Do not resuscitate: Secondary | ICD-10-CM | POA: Diagnosis not present

## 2015-11-11 DIAGNOSIS — I5021 Acute systolic (congestive) heart failure: Secondary | ICD-10-CM | POA: Diagnosis not present

## 2015-11-11 DIAGNOSIS — A409 Streptococcal sepsis, unspecified: Secondary | ICD-10-CM | POA: Diagnosis not present

## 2015-11-11 DIAGNOSIS — I5031 Acute diastolic (congestive) heart failure: Secondary | ICD-10-CM

## 2015-11-11 DIAGNOSIS — N183 Chronic kidney disease, stage 3 (moderate): Secondary | ICD-10-CM | POA: Diagnosis present

## 2015-11-11 DIAGNOSIS — G9340 Encephalopathy, unspecified: Secondary | ICD-10-CM | POA: Diagnosis not present

## 2015-11-11 DIAGNOSIS — E43 Unspecified severe protein-calorie malnutrition: Secondary | ICD-10-CM | POA: Diagnosis not present

## 2015-11-11 DIAGNOSIS — G2581 Restless legs syndrome: Secondary | ICD-10-CM | POA: Diagnosis present

## 2015-11-11 DIAGNOSIS — R7881 Bacteremia: Secondary | ICD-10-CM | POA: Diagnosis not present

## 2015-11-11 DIAGNOSIS — Z7901 Long term (current) use of anticoagulants: Secondary | ICD-10-CM | POA: Diagnosis not present

## 2015-11-11 DIAGNOSIS — Z87891 Personal history of nicotine dependence: Secondary | ICD-10-CM | POA: Diagnosis not present

## 2015-11-11 DIAGNOSIS — I509 Heart failure, unspecified: Secondary | ICD-10-CM | POA: Diagnosis not present

## 2015-11-11 DIAGNOSIS — J90 Pleural effusion, not elsewhere classified: Secondary | ICD-10-CM | POA: Diagnosis not present

## 2015-11-11 DIAGNOSIS — I5023 Acute on chronic systolic (congestive) heart failure: Secondary | ICD-10-CM | POA: Diagnosis not present

## 2015-11-11 DIAGNOSIS — N400 Enlarged prostate without lower urinary tract symptoms: Secondary | ICD-10-CM | POA: Diagnosis present

## 2015-11-11 DIAGNOSIS — J440 Chronic obstructive pulmonary disease with acute lower respiratory infection: Secondary | ICD-10-CM | POA: Diagnosis present

## 2015-11-11 DIAGNOSIS — J9601 Acute respiratory failure with hypoxia: Secondary | ICD-10-CM | POA: Diagnosis not present

## 2015-11-11 DIAGNOSIS — L8990 Pressure ulcer of unspecified site, unspecified stage: Secondary | ICD-10-CM | POA: Diagnosis not present

## 2015-11-11 DIAGNOSIS — N179 Acute kidney failure, unspecified: Secondary | ICD-10-CM | POA: Diagnosis not present

## 2015-11-11 DIAGNOSIS — E785 Hyperlipidemia, unspecified: Secondary | ICD-10-CM | POA: Diagnosis present

## 2015-11-11 DIAGNOSIS — Z7951 Long term (current) use of inhaled steroids: Secondary | ICD-10-CM | POA: Diagnosis not present

## 2015-11-11 DIAGNOSIS — I272 Other secondary pulmonary hypertension: Secondary | ICD-10-CM | POA: Diagnosis present

## 2015-11-11 DIAGNOSIS — I2510 Atherosclerotic heart disease of native coronary artery without angina pectoris: Secondary | ICD-10-CM | POA: Diagnosis present

## 2015-11-11 DIAGNOSIS — J189 Pneumonia, unspecified organism: Secondary | ICD-10-CM | POA: Diagnosis not present

## 2015-11-11 DIAGNOSIS — D6959 Other secondary thrombocytopenia: Secondary | ICD-10-CM | POA: Diagnosis present

## 2015-11-11 DIAGNOSIS — J1100 Influenza due to unidentified influenza virus with unspecified type of pneumonia: Secondary | ICD-10-CM | POA: Diagnosis not present

## 2015-11-11 DIAGNOSIS — Z0189 Encounter for other specified special examinations: Secondary | ICD-10-CM | POA: Diagnosis not present

## 2015-11-11 DIAGNOSIS — I469 Cardiac arrest, cause unspecified: Secondary | ICD-10-CM | POA: Diagnosis not present

## 2015-11-11 DIAGNOSIS — R739 Hyperglycemia, unspecified: Secondary | ICD-10-CM | POA: Diagnosis not present

## 2015-11-11 DIAGNOSIS — Z681 Body mass index (BMI) 19 or less, adult: Secondary | ICD-10-CM | POA: Diagnosis not present

## 2015-11-11 DIAGNOSIS — A419 Sepsis, unspecified organism: Secondary | ICD-10-CM | POA: Diagnosis present

## 2015-11-11 DIAGNOSIS — B954 Other streptococcus as the cause of diseases classified elsewhere: Secondary | ICD-10-CM | POA: Diagnosis not present

## 2015-11-11 DIAGNOSIS — I130 Hypertensive heart and chronic kidney disease with heart failure and stage 1 through stage 4 chronic kidney disease, or unspecified chronic kidney disease: Secondary | ICD-10-CM | POA: Diagnosis present

## 2015-11-11 DIAGNOSIS — I472 Ventricular tachycardia: Secondary | ICD-10-CM | POA: Diagnosis not present

## 2015-11-11 DIAGNOSIS — I4891 Unspecified atrial fibrillation: Secondary | ICD-10-CM | POA: Diagnosis present

## 2015-11-11 DIAGNOSIS — K219 Gastro-esophageal reflux disease without esophagitis: Secondary | ICD-10-CM | POA: Diagnosis present

## 2015-11-11 DIAGNOSIS — Z950 Presence of cardiac pacemaker: Secondary | ICD-10-CM | POA: Diagnosis not present

## 2015-11-11 DIAGNOSIS — I330 Acute and subacute infective endocarditis: Secondary | ICD-10-CM | POA: Diagnosis not present

## 2015-11-11 DIAGNOSIS — Z79899 Other long term (current) drug therapy: Secondary | ICD-10-CM | POA: Diagnosis not present

## 2015-11-11 LAB — LACTIC ACID, PLASMA: Lactic Acid, Venous: 4.6 mmol/L (ref 0.5–2.0)

## 2015-11-11 LAB — BASIC METABOLIC PANEL
Anion gap: 10 (ref 5–15)
BUN: 23 mg/dL — AB (ref 6–20)
CHLORIDE: 108 mmol/L (ref 101–111)
CO2: 26 mmol/L (ref 22–32)
CREATININE: 1.37 mg/dL — AB (ref 0.61–1.24)
Calcium: 9.3 mg/dL (ref 8.9–10.3)
GFR calc Af Amer: 51 mL/min — ABNORMAL LOW (ref >60)
GFR calc non Af Amer: 44 mL/min — ABNORMAL LOW (ref >60)
GLUCOSE: 165 mg/dL — AB (ref 65–99)
POTASSIUM: 4.5 mmol/L (ref 3.5–5.1)
Sodium: 144 mmol/L (ref 135–145)

## 2015-11-11 LAB — HIV ANTIBODY (ROUTINE TESTING W REFLEX): HIV SCREEN 4TH GENERATION: NONREACTIVE

## 2015-11-11 LAB — CBC
HEMATOCRIT: 32.5 % — AB (ref 39.0–52.0)
Hemoglobin: 10.1 g/dL — ABNORMAL LOW (ref 13.0–17.0)
MCH: 28.1 pg (ref 26.0–34.0)
MCHC: 31.1 g/dL (ref 30.0–36.0)
MCV: 90.3 fL (ref 78.0–100.0)
PLATELETS: 47 10*3/uL — AB (ref 150–400)
RBC: 3.6 MIL/uL — ABNORMAL LOW (ref 4.22–5.81)
RDW: 18.4 % — AB (ref 11.5–15.5)
WBC: 11.8 10*3/uL — ABNORMAL HIGH (ref 4.0–10.5)

## 2015-11-11 LAB — STREP PNEUMONIAE URINARY ANTIGEN: STREP PNEUMO URINARY ANTIGEN: NEGATIVE

## 2015-11-11 LAB — EXPECTORATED SPUTUM ASSESSMENT W REFEX TO RESP CULTURE

## 2015-11-11 LAB — EXPECTORATED SPUTUM ASSESSMENT W GRAM STAIN, RFLX TO RESP C

## 2015-11-11 LAB — URINE CULTURE

## 2015-11-11 LAB — PATHOLOGIST SMEAR REVIEW

## 2015-11-11 LAB — GLUCOSE, CAPILLARY: Glucose-Capillary: 168 mg/dL — ABNORMAL HIGH (ref 65–99)

## 2015-11-11 MED ORDER — SODIUM CHLORIDE 0.9 % IV SOLN
INTRAVENOUS | Status: DC
  Administered 2015-11-11 – 2015-11-12 (×2): via INTRAVENOUS

## 2015-11-11 MED ORDER — VANCOMYCIN HCL IN DEXTROSE 1-5 GM/200ML-% IV SOLN
1000.0000 mg | INTRAVENOUS | Status: DC
  Administered 2015-11-11 – 2015-11-14 (×4): 1000 mg via INTRAVENOUS
  Filled 2015-11-11 (×6): qty 200

## 2015-11-11 MED ORDER — OSELTAMIVIR PHOSPHATE 6 MG/ML PO SUSR
30.0000 mg | Freq: Two times a day (BID) | ORAL | Status: AC
  Administered 2015-11-11 – 2015-11-14 (×7): 30 mg via ORAL
  Filled 2015-11-11 (×7): qty 5

## 2015-11-11 MED ORDER — DEXTROSE 5 % IV SOLN: 2.0000 g | INTRAVENOUS | Status: DC

## 2015-11-11 MED ORDER — FUROSEMIDE 10 MG/ML IJ SOLN
20.0000 mg | Freq: Two times a day (BID) | INTRAMUSCULAR | Status: DC
Start: 2015-11-11 — End: 2015-11-11
  Administered 2015-11-11: 20 mg via INTRAVENOUS
  Filled 2015-11-11: qty 2

## 2015-11-11 MED ORDER — DEXTROSE 5 % IV SOLN
1.0000 g | Freq: Once | INTRAVENOUS | Status: DC
  Filled 2015-11-11: qty 10

## 2015-11-11 NOTE — Progress Notes (Signed)
Critical lab called for Blood cultures in both aerobic and anaerobic bottles: Gram Positive Cocci in chains.  Floor coverage MD paged and issue an order for Vancomycin consult with Pharmacy and Gram stain STAT.

## 2015-11-11 NOTE — Progress Notes (Signed)
PROGRESS NOTE  [name] [name] QGB:[id number] DOB: 07-Aug-1926 DOA: 11/10/2015 PCP: Odette Fraction, MD  HPI/Recap of past 102 hours: 82 year old male with past medical history of CAD, diastolic CHF, atrial fibrillation and COPD who presented to the emergency room on 3/7 after several days of generalized weakness and confusion plus fever and productive cough with brown sputum. Noted to be febrile in the emergency room plus tachycardia and leukocytosis although normal chest x-ray, normal lactic acid level and good oxygen saturations. Influenza A cultures positive as were blood cultures for gram-positive cocci in chains. Given history of CHF, BNP ordered and came back elevated at 1792.  Following admission, lactic acid level trended upward  Patient seen today and feels like breathing is better.  Still weak.  Assessment/Plan: Active Problems:   Acute diastolic heart failure ([place]): checking echo, have started lasix   Sepsis due to gram positive community acquired-pneumonia [facility]) & influenza: Abx narrowed to Vancomycin, suspect strep.  Have also started Tamiflu.  Sepsis stabilized, checking repeat lactic acid level.  Pt met criteria on admission given leukocytosis, + blood culture, tachycardia, tachypnea   Acute encephalopathy: resolved, back to baseline.  Secondary to sepsis.    Code Status: full   Family Communication: daughter at bedside   Disposition Plan: likely in hospital for     Consultants:  None   Procedures:  Echo ordered  Antibiotics:  IV rocephin 3/7-3/8  IV Vancomycin 3/8-present  IV Zithromax 3/7-present   Objective: BP 122/68 mmHg  Pulse 77  Temp(Src) 98 F (36.7 C) (Oral)  Resp 20  Wt 70.4 kg (155 lb 3.3 oz)  SpO2 99%  Intake/Output Summary (Last 24 hours) at 11/11/15 1429 Last data filed at 11/11/15 1426  Gross per 24 hour  Intake 4538.33 ml  Output    825 ml  Net 3713.33 ml   Filed Weights   11/11/15 0500  Weight: 70.4 kg (155 lb 3.3 oz)     Exam:   General:  Alert & oriented x 2, NAD   Cardiovascular: RRR S1, S2, II/VI SEM   Respiratory: decreased sounds bibasilar   Abdomen: soft/nt/nd/+BS   Musculoskeletal: no clubbing, cyanosis or edema    Data Reviewed: Basic Metabolic Panel:  Recent Labs Lab 11/10/15 0910 11/10/15 1456 11/11/15 0530  NA 142 142 144  K 4.1 4.2 4.5  CL 106 107 108  CO2 _0 GLUCOSE 155* 200* 165*  BUN 19 21* 23*  CREATININE 1.59* 1.62* 1.37*  CALCIUM 9.2 8.8* 9.3   Liver Function Tests:  Recent Labs Lab 11/10/15 0910 11/10/15 1456  AST 24 35  ALT 11* 13*  ALKPHOS 60 63  BILITOT 1.1 1.4*  PROT 6.0* 5.7*  ALBUMIN 3.6 3.3*   No results for input(s): LIPASE, AMYLASE in the last 168 hours.  Recent Labs Lab 11/10/15 1456  AMMONIA 25   CBC:  Recent Labs Lab 11/10/15 0910 11/10/15 1456 11/11/15 0530  WBC 14.1* 10.8* 11.8*  NEUTROABS 4.8 3.9  --   HGB 10.0* 9.3* 10.1*  HCT 31.8* 29.9* 32.5*  MCV 89.3 90.3 90.3  PLT 51* 50* 47*   Cardiac Enzymes:   No results for input(s): CKTOTAL, CKMB, CKMBINDEX, TROPONINI in the last 168 hours. BNP (last 3 results)  Recent Labs  11/10/15 1456  BNP 1792.8*    ProBNP (last 3 results) No results for input(s): PROBNP in the last 8760 hours.  CBG:  Recent Labs Lab 11/11/15 0732  GLUCAP 168*    Recent Results (from  the past 240 hour(s))  Culture, blood (routine x 2)     Status: None (Preliminary result)   Collection Time: 11/10/15  9:00 AM  Result Value Ref Range Status   Specimen Description BLOOD RIGHT ANTECUBITAL  Final   Special Requests BOTTLES DRAWN AEROBIC AND ANAEROBIC 5CC  Final   Culture  Setup Time   Final    GRAM POSITIVE COCCI IN CHAINS IN BOTH AEROBIC AND ANAEROBIC BOTTLES CRITICAL RESULT CALLED TO, READ BACK BY AND VERIFIED WITH: C CISON,RN _0  11/11/15 MKELLY    Culture TOO YOUNG TO READ  Final   Report Status PENDING  Incomplete  Culture, blood (routine x 2)     Status: None (Preliminary  result)   Collection Time: 11/10/15  9:10 AM  Result Value Ref Range Status   Specimen Description BLOOD RIGHT ARM  Final   Special Requests BOTTLES DRAWN AEROBIC AND ANAEROBIC 5CC  Final   Culture  Setup Time   Final    GRAM POSITIVE COCCI IN CHAINS IN BOTH AEROBIC AND ANAEROBIC BOTTLES CRITICAL RESULT CALLED TO, READ BACK BY AND VERIFIED WITH: C CISON,RN _1  11/11/15 MKELLY    Culture TOO YOUNG TO READ  Final   Report Status PENDING  Incomplete  Urine culture     Status: None   Collection Time: 11/10/15 10:21 AM  Result Value Ref Range Status   Specimen Description URINE, CATHETERIZED  Final   Special Requests NONE  Final   Culture 1,000 COLONIES/mL INSIGNIFICANT GROWTH  Final   Report Status 11/11/2015 FINAL  Final  Culture, sputum-assessment     Status: None   Collection Time: 11/10/15  2:11 PM  Result Value Ref Range Status   Specimen Description SPUTUM  Final   Special Requests NONE  Final   Sputum evaluation   Final    THIS SPECIMEN IS ACCEPTABLE. RESPIRATORY CULTURE REPORT TO FOLLOW.   Report Status 11/11/2015 FINAL  Final  Culture, respiratory (NON-Expectorated)     Status: None (Preliminary result)   Collection Time: 11/10/15  2:11 PM  Result Value Ref Range Status   Specimen Description SPUTUM  Final   Special Requests NONE  Final   Gram Stain   Final    ABUNDANT WBC PRESENT,BOTH PMN AND MONONUCLEAR FEW SQUAMOUS EPITHELIAL CELLS PRESENT FEW GRAM POSITIVE COCCI IN PAIRS FEW GRAM POSITIVE RODS THIS SPECIMEN IS ACCEPTABLE FOR SPUTUM CULTURE Performed at [facility]    Culture PENDING  Incomplete   Report Status PENDING  Incomplete     Studies: No results found.  Scheduled Meds: . azithromycin (ZITHROMAX) 500 MG IVPB  500 mg Intravenous Q24H  . cefTRIAXone (ROCEPHIN)  IV  1 g Intravenous Once  . [START ON 11/12/2015] cefTRIAXone (ROCEPHIN)  IV  2 g Intravenous Q24H  . furosemide  20 mg Intravenous Q12H  . oseltamivir  30 mg Oral BID  . vancomycin   1,000 mg Intravenous Q24H    Continuous Infusions:    Time spent: 35 minutes  [place] [facility] Pager [phone] . If 7PM-7AM, please contact night-coverage at [email or web address], password [facility] 11/11/2015, 2:29 PM

## 2015-11-11 NOTE — Evaluation (Addendum)
Physical Therapy Evaluation Patient Details Name: [name] [name] MRN: [id number] DOB: 1925-12-28 Today's Date: 11/11/2015   History of Present Illness  presented with fever and dyspnea. r/o sepsis. +flu and CHF exacerbation PMHx-HIV, Hepatitis C, CAD, CHF, afib, dementia, COPD  Clinical Impression  Pt admitted with above diagnoses. Per daughter, pt is often alone although he lives with his sister and brother-in-law. (Brother-in-law is currently hospitalized). + history of falls (most recent ~4 months ago). Pt currently with functional limitations due to the deficits listed below (see PT Problem List).  Pt will benefit from skilled PT to increase their independence and safety with mobility to allow discharge to the venue listed below.       Follow Up Recommendations SNF;Supervision/Assistance - 24 hour (per daughter, anticipate patient will refuse; in that case, would recommend max HH services including HHPT--which pt will likely refuse)    Equipment Recommendations  Rolling walker with 5" wheels    Recommendations for Other Services OT consult     Precautions / Restrictions Precautions Precautions: Fall Precaution Comments: last fell ~4 months ago per daughter      Mobility  Bed Mobility Overal bed mobility: Needs Assistance Bed Mobility: Supine to Sit;Sit to Supine     Supine to sit: Min guard;HOB elevated Sit to supine: Min guard   General bed mobility comments: HOB elevated due to dyspnea; +use of rail and near need for assist   Transfers Overall transfer level: Needs assistance Equipment used: 1 person hand held assist Transfers: Sit to/from Stand Sit to Stand: Mod assist         General transfer comment: posterior LOB as coming to stand;  Ambulation/Gait Ambulation/Gait assistance: Mod assist Ambulation Distance (Feet): 2 Feet Assistive device: 1 person hand held assist Gait Pattern/deviations: Step-to pattern     General Gait Details: side step to [facility]; very  limited due to dyspnea  Stairs            Wheelchair Mobility    Modified Rankin (Stroke Patients Only)       Balance Overall balance assessment: History of Falls;Needs assistance Sitting-balance support: No upper extremity supported;Feet supported Sitting balance-Leahy Scale: Fair Sitting balance - Comments: >fair not tested; sat EOB 4 minutes with attempt to get pulse ox reading   Standing balance support: Single extremity supported Standing balance-Leahy Scale: Poor Standing balance comment: posterior bias as comes to stand                             Pertinent Vitals/Pain HR 61-66, on 2L O2 with severe dyspnea and expiratory wheezing; unable to get accurate pulse ox reading  Pain Assessment: No/denies pain    Home Living Family/patient expects to be discharged to:: Private residence Living Arrangements: Other relatives (sister and brother-in-law) Available Help at Discharge: Family;Available PRN/intermittently Type of Home: House Home Access: Ramped entrance     Home Layout: One level Home Equipment: Cane - single point (daughter has shower seat he could borrow) Additional Comments: per daughter, home alone quite often; brother-in-law currently hospitalized     Prior Function Level of Independence: Independent               Hand Dominance        Extremity/Trunk Assessment   Upper Extremity Assessment: Generalized weakness           Lower Extremity Assessment: Generalized weakness      Cervical / Trunk Assessment: Kyphotic  Communication  Communication: HOH  Cognition Arousal/Alertness: Awake/alert Behavior During Therapy: WFL for tasks assessed/performed Overall Cognitive Status: History of cognitive impairments - at baseline       Memory: Decreased short-term memory (daughter corrected almost every answer he gave)              General Comments General comments (skin integrity, edema, etc.): Daughter present.  Describes pt as "very hard headed and independent" states he will not agree to SNF    Exercises        Assessment/Plan    PT Assessment Patient needs continued PT services  PT Diagnosis Difficulty walking;Generalized weakness   PT Problem List Decreased strength;Decreased activity tolerance;Decreased balance;Decreased mobility;Decreased cognition;Decreased knowledge of use of DME;Decreased safety awareness;Cardiopulmonary status limiting activity  PT Treatment Interventions DME instruction;Gait training;Functional mobility training;Therapeutic activities;Therapeutic exercise;Balance training;Cognitive remediation;Patient/family education   PT Goals (Current goals can be found in the Care Plan section) Acute Rehab PT Goals Patient Stated Goal: return home soon PT Goal Formulation: With patient/family Time For Goal Achievement: 11/25/15 Potential to Achieve Goals: Fair Additional Goals Additional Goal #1: Maintain SaO2>88% on RA, or able to manipulate O2 tubing with minimal cues    Frequency Min 3X/week   Barriers to discharge Decreased caregiver support      Co-evaluation               End of Session Equipment Utilized During Treatment: Gait belt;Oxygen Activity Tolerance: Patient limited by fatigue;Treatment limited secondary to medical complications (Comment) (dyspnea) Patient left: in bed;with call bell/phone within reach;with bed alarm set;with nursing/sitter in room;with family/visitor present Nurse Communication: Mobility status;Other (comment) (dyspnea; no recliner available for pt's room)    Functional Assessment Tool Used: clinical judgement Functional Limitation: Mobility: Walking and moving around Mobility: Walking and Moving Around Current Status [id number]): At least 80 percent but less than 100 percent impaired, limited or restricted (due to respiratory status) Mobility: Walking and Moving Around Goal Status [phone]): At least 1 percent but less than 20 percent  impaired, limited or restricted    Time: 1131-1158 PT Time Calculation (min) (ACUTE ONLY): 27 min   Charges:   PT Evaluation $PT Eval Low Complexity: 1 Procedure PT Treatments $Therapeutic Activity: 8-22 mins   PT G Codes:   PT G-Codes **NOT FOR INPATIENT CLASS** Functional Assessment Tool Used: clinical judgement Functional Limitation: Mobility: Walking and moving around Mobility: Walking and Moving Around Current Status [id number]): At least 80 percent but less than 100 percent impaired, limited or restricted (due to respiratory status) Mobility: Walking and Moving Around Goal Status [phone]): At least 1 percent but less than 20 percent impaired, limited or restricted    Jaritza Duignan 11/11/2015, 12:38 PM Pager [phone]

## 2015-11-11 NOTE — Progress Notes (Signed)
CRITICAL VALUE ALERT  Critical value received: Lactic acid 4.6  Date of notification:  11/11/15  Time of notification:  [id number] Critical value read back:yes  Nurse who received alert:  Jeralene Peters, RN MD notified (1st page):  Dr. Maryland Pink  Time of first page:  1605; Face to face  MD notified (2nd page): N/A  Time of second page:N/A  Responding MD:  Dr. Maryland Pink  Time MD responded:  [phone]

## 2015-11-11 NOTE — Progress Notes (Signed)
Pharmacy Antibiotic Note  [name] [name] is a 82 y.o. male admitted on 11/10/2015 with bacteremia.  Pharmacy has been consulted for Vancomycin dosing. Pt on Azithromycin and Rocephin for CAP.  Since blood cx's show 2/2 GPC in chains will adjust ceftriaxone dose. Afebrile, WBC 11.8.  Plan: Continue vancomycin 1g IV Q24  Increase ceftriaxone to 2g IV Q24 per MD Azithromycin 500mg  IV Q24 per MD Follow up cx results to de-escalate abx   Weight: 155 lb 3.3 oz (70.4 kg)  Temp (24hrs), Avg:98.2 F (36.8 C), Min:97.8 F (36.6 C), Max:98.7 F (37.1 C)   Recent Labs Lab 11/10/15 0910 11/10/15 0925 11/10/15 1230 11/10/15 1456 11/11/15 0530  WBC 14.1*  --   --  10.8* 11.8*  CREATININE 1.59*  --   --  1.62* 1.37*  LATICACIDVEN  --  1.36 2.64* 2.6*  --     Estimated Creatinine Clearance: 37.1 mL/min (by C-G formula based on Cr of 1.37).    Allergies  Allergen Reactions  . Amoxicillin Itching and Rash  . Penicillins Itching and Rash    Has patient had a PCN reaction causing immediate rash, facial/tongue/throat swelling, SOB or lightheadedness with hypotension: Yes Has patient had a PCN reaction causing severe rash involving mucus membranes or skin necrosis: No Has patient had a PCN reaction that required hospitalization No Has patient had a PCN reaction occurring within the last 10 years: No If all of the above answers are "NO", then may proceed with Cephalosporin use.     Antimicrobials this admission: 3/8 Vanc >>  3/7 Rocephin >>  3/7 Azith>> 3/7 Tamiflu>>  Dose adjustments this admission: n/a  Microbiology results: 3/7 BCx x 2: 2/2 GPC in chains 3/7 Urine:  3/7 Sputum:    Thank you for allowing pharmacy to be a part of this patient's care.  Elenor Quinones, PharmD, BCPS Clinical Pharmacist Pager [phone] 11/11/2015 1:28 PM

## 2015-11-11 NOTE — Progress Notes (Signed)
Pt transferred to 3S13 via bed from 6N23 with telemetry in place.  Pt AAO X 4.  Pt on 3L02 via [place].  Pt has 22G to Lt FA SL.  Belongings sent with patient.  Attempted to call sister and notify of transfer, but no answer.  Report given to [facility] [place].

## 2015-11-11 NOTE — Progress Notes (Signed)
Pharmacy Antibiotic Note  [name] [name] is a 82 y.o. male admitted on 11/10/2015 with bacteremia.  Pharmacy has been consulted for Vancomycin dosing. Pt on Azithromycin and Rocephin for CAP.  Plan: Vancomycin 1gm IV q24h Will f/u micro data, renal function, and pt's clinical condition Vanc trough prn   Weight: 155 lb 3.3 oz (70.4 kg)  Temp (24hrs), Avg:99.6 F (37.6 C), Min:97.8 F (36.6 C), Max:102.2 F (39 C)   Recent Labs Lab 11/10/15 0910 11/10/15 0925 11/10/15 1230 11/10/15 1456  WBC 14.1*  --   --  10.8*  CREATININE 1.59*  --   --  1.62*  LATICACIDVEN  --  1.36 2.64* 2.6*    Estimated Creatinine Clearance: 31.4 mL/min (by C-G formula based on Cr of 1.62).    Allergies  Allergen Reactions  . Amoxicillin Itching and Rash  . Penicillins Itching and Rash    Has patient had a PCN reaction causing immediate rash, facial/tongue/throat swelling, SOB or lightheadedness with hypotension: Yes Has patient had a PCN reaction causing severe rash involving mucus membranes or skin necrosis: No Has patient had a PCN reaction that required hospitalization No Has patient had a PCN reaction occurring within the last 10 years: No If all of the above answers are "NO", then may proceed with Cephalosporin use.     Antimicrobials this admission: 3/8 Vanc >>  3/7 Rocephin >>  3/7 Azith>> 3/7 Tamiflu>>  Dose adjustments this admission: n/a  Microbiology results: 3/7 BCx x 2: 2/2 GPC 3/7 Urine:  3/7 Sputum:    Thank you for allowing pharmacy to be a part of this patient's care.  Sherlon Handing, PharmD, BCPS Clinical pharmacist, pager [phone] 11/11/2015 5:22 AM

## 2015-11-12 ENCOUNTER — Inpatient Hospital Stay (HOSPITAL_COMMUNITY): Payer: Medicare Other

## 2015-11-12 DIAGNOSIS — N400 Enlarged prostate without lower urinary tract symptoms: Secondary | ICD-10-CM

## 2015-11-12 DIAGNOSIS — I482 Chronic atrial fibrillation: Secondary | ICD-10-CM

## 2015-11-12 LAB — ECHOCARDIOGRAM COMPLETE
Height: 69 in
Weight: 2289.26 oz

## 2015-11-12 LAB — GRAM STAIN

## 2015-11-12 LAB — LEGIONELLA ANTIGEN, URINE

## 2015-11-12 LAB — CBC
HCT: 32.8 % — ABNORMAL LOW (ref 39.0–52.0)
Hemoglobin: 10.2 g/dL — ABNORMAL LOW (ref 13.0–17.0)
MCH: 28.4 pg (ref 26.0–34.0)
MCHC: 31.1 g/dL (ref 30.0–36.0)
MCV: 91.4 fL (ref 78.0–100.0)
PLATELETS: 59 10*3/uL — AB (ref 150–400)
RBC: 3.59 MIL/uL — ABNORMAL LOW (ref 4.22–5.81)
RDW: 18.2 % — AB (ref 11.5–15.5)
WBC: 19.3 10*3/uL — ABNORMAL HIGH (ref 4.0–10.5)

## 2015-11-12 LAB — GLUCOSE, CAPILLARY: GLUCOSE-CAPILLARY: 130 mg/dL — AB (ref 65–99)

## 2015-11-12 LAB — LACTIC ACID, PLASMA
LACTIC ACID, VENOUS: 3.3 mmol/L — AB (ref 0.5–2.0)
Lactic Acid, Venous: 1.8 mmol/L (ref 0.5–2.0)

## 2015-11-12 MED ORDER — SODIUM CHLORIDE 0.9 % IV SOLN
INTRAVENOUS | Status: DC
  Administered 2015-11-12: 100 mL/h via INTRAVENOUS
  Administered 2015-11-13: 06:00:00 via INTRAVENOUS

## 2015-11-12 MED ORDER — SIMVASTATIN 10 MG PO TABS
10.0000 mg | ORAL_TABLET | Freq: Every day | ORAL | Status: DC
  Administered 2015-11-12 – 2015-12-01 (×19): 10 mg via ORAL
  Filled 2015-11-12 (×20): qty 1

## 2015-11-12 MED ORDER — ROPINIROLE HCL 1 MG PO TABS
4.0000 mg | ORAL_TABLET | Freq: Every day | ORAL | Status: DC
  Administered 2015-11-12 – 2015-11-14 (×3): 4 mg via ORAL
  Filled 2015-11-12 (×3): qty 4

## 2015-11-12 MED ORDER — TAMSULOSIN HCL 0.4 MG PO CAPS
0.4000 mg | ORAL_CAPSULE | Freq: Every day | ORAL | Status: DC
  Administered 2015-11-12 – 2015-12-01 (×20): 0.4 mg via ORAL
  Filled 2015-11-12 (×24): qty 1

## 2015-11-12 MED ORDER — RIVAROXABAN 15 MG PO TABS
15.0000 mg | ORAL_TABLET | Freq: Every day | ORAL | Status: DC
  Administered 2015-11-12 – 2015-11-13 (×2): 15 mg via ORAL
  Filled 2015-11-12 (×2): qty 1

## 2015-11-12 MED ORDER — DONEPEZIL HCL 5 MG PO TABS
5.0000 mg | ORAL_TABLET | Freq: Every day | ORAL | Status: DC
  Administered 2015-11-12 – 2015-12-01 (×19): 5 mg via ORAL
  Filled 2015-11-12 (×21): qty 1

## 2015-11-12 MED ORDER — FUROSEMIDE 10 MG/ML IJ SOLN
40.0000 mg | Freq: Once | INTRAMUSCULAR | Status: AC
  Administered 2015-11-12: 40 mg via INTRAVENOUS
  Filled 2015-11-12: qty 4

## 2015-11-12 NOTE — Progress Notes (Signed)
Dr. Maryland Pink notified via text message of patient's tachypnea, dyspnea, and short session of non-rebreather mask on nights.

## 2015-11-12 NOTE — Progress Notes (Signed)
Physical Therapy Treatment Patient Details Name: [name] [name] MRN: [id number] DOB: Feb 08, 1926 Today's Date: 11/12/2015    History of Present Illness presented with fever and dyspnea. r/o sepsis. +flu and CHF exacerbation PMHx-HIV, Hepatitis C, CAD, CHF, afib, dementia, COPD    PT Comments    Mr. Levis remains limited by dyspnea but ambulated 10 ft w/ 1 person mod assist due to posterior lean.  SpO2 96% on 4 LPM O2 while ambulating but poor reading at end of ambulation and at rest sitting in chair.  Pt will benefit from continued skilled PT services to increase functional independence and safety.   Follow Up Recommendations  SNF;Supervision/Assistance - 24 hour (per daughter, anticipate patient will refuse)     Equipment Recommendations  Rolling walker with 5" wheels    Recommendations for Other Services OT consult     Precautions / Restrictions Precautions Precautions: Fall Precaution Comments: last fell ~4 months ago per daughter Restrictions Weight Bearing Restrictions: No    Mobility  Bed Mobility Overal bed mobility: Needs Assistance Bed Mobility: Supine to Sit     Supine to sit: Min guard;HOB elevated     General bed mobility comments: HOB elevated due to dyspnea; +use of rail, cues to scoot EOB  Transfers Overall transfer level: Needs assistance Equipment used: 1 person hand held assist Transfers: Sit to/from [facility] Sit to Stand: Mod assist Stand pivot transfers: Mod assist       General transfer comment: posterior LOB as coming to stand and braces leg against bed.  Assist for controlled descent to chair.  Ambulation/Gait Ambulation/Gait assistance: Mod assist Ambulation Distance (Feet): 10 Feet Assistive device: 1 person hand held assist Gait Pattern/deviations: Step-through pattern;Decreased stride length;Leaning posteriorly;Staggering left;Staggering right;Trunk flexed   Gait velocity interpretation: <1.8 ft/sec, indicative of  risk for recurrent falls General Gait Details: very limited due to dyspnea; Mod assist to steady due to trunk flexion w/ posterior lean.  Pt begins reaching out for bed w/ other UE due to dyspnea.     Stairs            Wheelchair Mobility    Modified Rankin (Stroke Patients Only)       Balance Overall balance assessment: Needs assistance;History of Falls Sitting-balance support: No upper extremity supported;Feet supported Sitting balance-Leahy Scale: Fair Sitting balance - Comments: Min guard for safety   Standing balance support: During functional activity;Single extremity supported Standing balance-Leahy Scale: Poor Standing balance comment: Unsteady and requires HHA due to posterior lean w/ trunk flexion                    Cognition Arousal/Alertness: Awake/alert Behavior During Therapy: WFL for tasks assessed/performed Overall Cognitive Status: History of cognitive impairments - at baseline       Memory: Decreased short-term memory              Exercises      General Comments General comments (skin integrity, edema, etc.): SpO2 reading 96% while ambulating short distance in room on 4 LPM O2.  Poor waveform w/ return to recliner chair.      Pertinent Vitals/Pain Pain Assessment: No/denies pain    Home Living                      Prior Function            PT Goals (current goals can now be found in the care plan section) Acute Rehab PT Goals Patient Stated Goal: none  stated PT Goal Formulation: With patient Time For Goal Achievement: 11/25/15 Potential to Achieve Goals: Fair Progress towards PT goals: Progressing toward goals (very modestly)    Frequency  Min 3X/week    PT Plan Current plan remains appropriate    Co-evaluation             End of Session Equipment Utilized During Treatment: Gait belt;Oxygen Activity Tolerance: Patient limited by fatigue;Treatment limited secondary to medical complications (Comment)  (dyspnea) Patient left: in chair;with call bell/phone within reach;with chair alarm set     Time: 1406-1430 PT Time Calculation (min) (ACUTE ONLY): 24 min  Charges:  $Therapeutic Activity: 23-37 mins                    G Codes:      Collie Siad PT, DPT  Pager: [phone] Phone: [phone] 11/12/2015, 2:43 PM

## 2015-11-12 NOTE — Evaluation (Signed)
Occupational Therapy Evaluation Patient Details Name: [name] [name] MRN: [id number] DOB: 07-12-1926 Today's Date: 11/12/2015    History of Present Illness presented with fever and dyspnea. r/o sepsis. +flu and CHF exacerbation PMHx-HIV, Hepatitis C, CAD, CHF, afib, dementia, COPD   Clinical Impression   Patient presenting with decreased ADL and functional mobility independence secondary to above. Patient independent PTA, living with sister and his brother-in-law. Patient currently functioning at an overall min to mod assist level. Patient will benefit from acute OT to increase overall independence in the areas of ADLs, functional mobility, and overall safety in order to safely discharge to venue listed below.   Patient's sats ranged during OT eval. Sats decreased and pulse increased during simple activity of doffing and re-donning socks. Pt limited by dyspnea, but willing to work with therapist. Cued patient on pursed lip breathing and seated rest breaks prn.     Follow Up Recommendations  SNF;Supervision/Assistance - 24 hour    Equipment Recommendations  Other (comment) (TBD next venue of care)    Recommendations for Other Services  None at this time    Precautions / Restrictions Precautions Precautions: Fall Precaution Comments: last fell ~4 months ago per daughter Restrictions Weight Bearing Restrictions: No    Mobility Bed Mobility Overal bed mobility: Needs Assistance Bed Mobility: Supine to Sit     Supine to sit: Min guard;HOB elevated     General bed mobility comments: Pt found seated in recliner upon OT entering/exiting room  Transfers Overall transfer level: Needs assistance Equipment used: 1 person hand held assist Transfers: Sit to/from [facility] Sit to Stand: Mod assist Stand pivot transfers: Mod assist       General transfer comment: Posterior lean in standing. Pt transferred recliner to/from [facility]. Cues for safety, sequencing, and energy  conservation prior to and post transfer. Mod assist needed for lift/lower assistance.     Balance Overall balance assessment: Needs assistance;History of Falls Sitting-balance support: No upper extremity supported;Feet supported Sitting balance-Leahy Scale: Fair Sitting balance - Comments: Min guard for safety   Standing balance support: No upper extremity supported;During functional activity Standing balance-Leahy Scale: Poor Standing balance comment: unsteady and requires HHA due to posterior lean with trunk flexion    ADL Overall ADL's : Needs assistance/impaired Eating/Feeding: Set up;Sitting   Grooming: Set up;Sitting   Upper Body Bathing: Minimal assitance;Sitting   Lower Body Bathing: Minimal assistance;Sit to/from stand   Upper Body Dressing : Minimal assistance;Sitting   Lower Body Dressing: Minimal assistance;Sit to/from stand   Toilet Transfer: Moderate assistance;Stand-pivot;BSC;Cueing for sequencing;Cueing for safety     Toileting - Clothing Manipulation Details (indicate cue type and reason): did not occur   Tub/Shower Transfer Details (indicate cue type and reason): did not occur   General ADL Comments: Pt mod assist for sit to/from stands for functional transfers and for ADL. Pt requires increased time and cueing for safety. Pt able to cross BLEs for LB ADLs, but when doing so pulse increases and 02 sats decrease. Cued pt for pursed lip breathing and seated rest break. Unsure how much patient learned due to h/o cognitive deficits.     Vision Vision Assessment?: No apparent visual deficits          Pertinent Vitals/Pain Pain Assessment: No/denies pain Faces Pain Scale: No hurt     Hand Dominance  ("both")   Extremity/Trunk Assessment Upper Extremity Assessment Upper Extremity Assessment: Generalized weakness (right weaker than left)   Lower Extremity Assessment Lower Extremity Assessment: Generalized weakness  Cervical / Trunk Assessment Cervical  / Trunk Assessment: Kyphotic   Communication Communication Communication: HOH   Cognition Arousal/Alertness: Awake/alert Behavior During Therapy: WFL for tasks assessed/performed Overall Cognitive Status: History of cognitive impairments - at baseline (Pt not oriented to situation or year)       Memory: Decreased short-term memory              Home Living Family/patient expects to be discharged to:: Private residence Living Arrangements: Other relatives (sister and brother-in-law) Available Help at Discharge: Family;Available PRN/intermittently (per daughter, home alone quite often; brother-in-law currently hospitalized ) Type of Home: House Home Access: Ramped entrance     Home Layout: One level     Bathroom Shower/Tub: Tub/shower unit   Home Equipment: Kasandra Knudsen - single point (daughter has shower seat he can borrow)   Additional Comments: Above is per PT evaluation, no family present to discuss home set-up or PLOF. According to RN, pt's sister to assist, but she is currently helping her husband who is also hospitilized       Prior Functioning/Environment Level of Independence: Independent      OT Diagnosis: Generalized weakness   OT Problem List: Decreased strength;Decreased activity tolerance;Impaired balance (sitting and/or standing);Decreased coordination;Decreased safety awareness;Decreased knowledge of use of DME or AE;Decreased knowledge of precautions;Impaired UE functional use   OT Treatment/Interventions: Self-care/ADL training;Therapeutic exercise;Energy conservation;DME and/or AE instruction;Therapeutic activities;Patient/family education;Balance training    OT Goals(Current goals can be found in the care plan section) Acute Rehab OT Goals Patient Stated Goal: none stated OT Goal Formulation: Patient unable to participate in goal setting Time For Goal Achievement: 11/26/15 Potential to Achieve Goals: Good ADL Goals Pt Will Perform Grooming: with min guard  assist;standing Pt Will Perform Lower Body Bathing: with min guard assist;sit to/from stand Pt Will Perform Lower Body Dressing: with min guard assist;sit to/from stand Pt Will Transfer to Toilet: with min guard assist;ambulating;bedside commode Additional ADL Goal #1: Pt will be supervision with sit to/from stands during ADL to decrease burden of care   OT Frequency: Min 2X/week   Barriers to D/C: Decreased caregiver support   End of Session Equipment Utilized During Treatment: Gait belt  Activity Tolerance: Patient tolerated treatment well Patient left: in chair;with call bell/phone within reach;with chair alarm set;with restraints reapplied   Time: [id number] OT Time Calculation (min): 17 min Charges:  OT General Charges $OT Visit: 1 Procedure OT Evaluation $OT Eval Moderate Complexity: 1 Procedure  Chrys Racer , MS, OTR/L, CLT Pager: [phone]  11/12/2015, 3:48 PM

## 2015-11-12 NOTE — Progress Notes (Signed)
Pt;s lactic acid is still critical but down to 3.3.

## 2015-11-12 NOTE — Progress Notes (Signed)
Pt de stats 70 to low 80's when urinating. Nonrebreather placed. Respiratory called. Pt O2 sat elevated after approximately 20 to 25 minutes. Will continue to monitor.

## 2015-11-12 NOTE — Progress Notes (Signed)
  Echocardiogram 2D Echocardiogram has been performed.  [name] [name] 11/12/2015, 12:44 PM

## 2015-11-12 NOTE — Progress Notes (Signed)
Patient provided a table top fan.  States he is "hot".

## 2015-11-12 NOTE — Progress Notes (Signed)
PROGRESS NOTE  [name] [name] JEH:[id number] DOB: 05-22-1926 DOA: 11/10/2015 PCP: Odette Fraction, MD  HPI/Recap of past 33 hours: 82 year old male with past medical history of CAD, diastolic CHF, atrial fibrillation and COPD who presented to the emergency room on 3/7 after several days of generalized weakness and confusion plus fever and productive cough with brown sputum. Noted to be febrile in the emergency room plus tachycardia and leukocytosis although normal chest x-ray, normal lactic acid level and good oxygen saturations. Influenza A cultures positive as were blood cultures for gram-positive cocci in chains. Given history of CHF, BNP ordered and came back elevated at 1792.  Following admission, lactic acid level trended upward & given need for agrressive fluids but in CHF, patient moved to stepdown on evening of 3/8.  This morning, patient more tachypneic and tachycardic requiring more oxygen. Lactic acid level down, but still elevated, currently at 3.3. Initially fluids stopped and given one dose of Lasix and fluids resumed. Patient himself feels like breathing is still rough, no pain  Assessment/Plan: Active Problems: Acute respiratory failure with hypoxia secondary to diastolic heart failure and pneumonia and flu  Acute diastolic heart failure ([place]): Checking echo. Given tachypnea and acute respiratory failure, have given 1 dose of IV Lasix, but given that sepsis is not resolved, need to resume IV fluids    Sepsis due to gram positive community acquired-pneumonia [facility]) & influenza: Abx narrowed to Vancomycin, suspect strep.  Have also started Tamiflu.  Patient initially looked improved, but with rising lactic acid level, sepsis improving, but not yet stabilized. Patient met criteria for sepsis on admission given positive blood cultures, lactic acidosis, tachypnea, tachycardia and leukocytosis.  Given concerns for further respiratory decline, have alerted critical care medicine about  patient. They will consult.    Acute encephalopathy: resolved, back to baseline.  Secondary to sepsis.    Atrial fibrillation on chronic anticoagulation: Chance to vascular score of 5. Resuming xarelto.  BPH: Resume Flomax   Code Status: full   Family Communication: Spoke with daughter yesterday  Disposition Plan: Will likely remain inpatient for several days.   Consultants:  Critical care  Procedures:  Echo done 3/9: Results pending  Antibiotics:  IV rocephin 3/7-3/8  IV Vancomycin 3/8-present  IV Zithromax 3/7-present   Objective: BP 125/77 mmHg  Pulse 118  Temp(Src) 97.8 F (36.6 C) (Oral)  Resp 25  Ht _0  (1.753 m)  Wt 64.9 kg (143 lb 1.3 oz)  BMI 21.12 kg/m2  SpO2 100%  Intake/Output Summary (Last 24 hours) at 11/12/15 1036 Last data filed at 11/12/15 1007  Gross per 24 hour  Intake 2237.08 ml  Output    650 ml  Net 1587.08 ml   Filed Weights   11/11/15 0500 11/11/15 2000 11/12/15 0500  Weight: 70.4 kg (155 lb 3.3 oz) 66.2 kg (145 lb 15.1 oz) 64.9 kg (143 lb 1.3 oz)    Exam:   General:  Alert & oriented x 2, NAD   Cardiovascular: Irregular rhythm, mild tachycardia  Respiratory: decreased sounds bibasilar , bilateral expiratory wheezing  Abdomen: soft/nt/nd/+BS   Musculoskeletal: no clubbing, cyanosis, trace pitting edema  Data Reviewed: Basic Metabolic Panel:  Recent Labs Lab 11/10/15 0910 11/10/15 1456 11/11/15 0530  NA 142 142 144  K 4.1 4.2 4.5  CL 106 107 108  CO2 _1 GLUCOSE 155* 200* 165*  BUN 19 21* 23*  CREATININE 1.59* 1.62* 1.37*  CALCIUM 9.2 8.8* 9.3   Liver Function Tests:  Recent  Labs Lab 11/10/15 0910 11/10/15 1456  AST 24 35  ALT 11* 13*  ALKPHOS 60 63  BILITOT 1.1 1.4*  PROT 6.0* 5.7*  ALBUMIN 3.6 3.3*   No results for input(s): LIPASE, AMYLASE in the last 168 hours.  Recent Labs Lab 11/10/15 1456  AMMONIA 25   CBC:  Recent Labs Lab 11/10/15 0910 11/10/15 1456 11/11/15 0530    WBC 14.1* 10.8* 11.8*  NEUTROABS 4.8 3.9  --   HGB 10.0* 9.3* 10.1*  HCT 31.8* 29.9* 32.5*  MCV 89.3 90.3 90.3  PLT 51* 50* 47*   Cardiac Enzymes:   No results for input(s): CKTOTAL, CKMB, CKMBINDEX, TROPONINI in the last 168 hours. BNP (last 3 results)  Recent Labs  11/10/15 1456  BNP 1792.8*    ProBNP (last 3 results) No results for input(s): PROBNP in the last 8760 hours.  CBG:  Recent Labs Lab 11/11/15 0732 11/12/15 0831  GLUCAP 168* 130*    Recent Results (from the past 240 hour(s))  Culture, blood (routine x 2)     Status: None (Preliminary result)   Collection Time: 11/10/15  9:00 AM  Result Value Ref Range Status   Specimen Description BLOOD RIGHT ANTECUBITAL  Final   Special Requests BOTTLES DRAWN AEROBIC AND ANAEROBIC 5CC  Final   Culture  Setup Time   Final    GRAM POSITIVE COCCI IN CHAINS IN BOTH AEROBIC AND ANAEROBIC BOTTLES CRITICAL RESULT CALLED TO, READ BACK BY AND VERIFIED WITH: C CISON,RN _0  11/11/15 MKELLY    Culture TOO YOUNG TO READ  Final   Report Status PENDING  Incomplete  Culture, blood (routine x 2)     Status: None (Preliminary result)   Collection Time: 11/10/15  9:10 AM  Result Value Ref Range Status   Specimen Description BLOOD RIGHT ARM  Final   Special Requests BOTTLES DRAWN AEROBIC AND ANAEROBIC 5CC  Final   Culture  Setup Time   Final    GRAM POSITIVE COCCI IN CHAINS IN BOTH AEROBIC AND ANAEROBIC BOTTLES CRITICAL RESULT CALLED TO, READ BACK BY AND VERIFIED WITH: C CISON,RN _1  11/11/15 MKELLY    Culture TOO YOUNG TO READ  Final   Report Status PENDING  Incomplete  Urine culture     Status: None   Collection Time: 11/10/15 10:21 AM  Result Value Ref Range Status   Specimen Description URINE, CATHETERIZED  Final   Special Requests NONE  Final   Culture 1,000 COLONIES/mL INSIGNIFICANT GROWTH  Final   Report Status 11/11/2015 FINAL  Final  Culture, sputum-assessment     Status: None   Collection Time: 11/10/15  2:11 PM   Result Value Ref Range Status   Specimen Description SPUTUM  Final   Special Requests NONE  Final   Sputum evaluation   Final    THIS SPECIMEN IS ACCEPTABLE. RESPIRATORY CULTURE REPORT TO FOLLOW.   Report Status 11/11/2015 FINAL  Final  Culture, respiratory (NON-Expectorated)     Status: None (Preliminary result)   Collection Time: 11/10/15  2:11 PM  Result Value Ref Range Status   Specimen Description SPUTUM  Final   Special Requests NONE  Final   Gram Stain   Final    ABUNDANT WBC PRESENT,BOTH PMN AND MONONUCLEAR FEW SQUAMOUS EPITHELIAL CELLS PRESENT FEW GRAM POSITIVE COCCI IN PAIRS FEW GRAM POSITIVE RODS THIS SPECIMEN IS ACCEPTABLE FOR SPUTUM CULTURE Performed at [facility]    Culture   Final    Culture reincubated for better growth Performed at [facility]    Report Status PENDING  Incomplete     Studies: No results found.  Scheduled Meds: . oseltamivir  30 mg Oral BID  . vancomycin  1,000 mg Intravenous Q24H    Continuous Infusions:    Time spent: 35 minutes  [place] [facility] Pager [phone] . If 7PM-7AM, please contact night-coverage at [email or web address], password [facility] 11/12/2015, 10:36 AM  LOS: 1 day

## 2015-11-13 ENCOUNTER — Inpatient Hospital Stay (HOSPITAL_COMMUNITY): Payer: Medicare Other

## 2015-11-13 DIAGNOSIS — E43 Unspecified severe protein-calorie malnutrition: Secondary | ICD-10-CM | POA: Diagnosis present

## 2015-11-13 LAB — CBC
HEMATOCRIT: 29.5 % — AB (ref 39.0–52.0)
HEMATOCRIT: 31.8 % — AB (ref 39.0–52.0)
HEMOGLOBIN: 9.7 g/dL — AB (ref 13.0–17.0)
Hemoglobin: 9.1 g/dL — ABNORMAL LOW (ref 13.0–17.0)
MCH: 28.3 pg (ref 26.0–34.0)
MCH: 27.2 pg (ref 26.0–34.0)
MCHC: 30.8 g/dL (ref 30.0–36.0)
MCHC: 30.5 g/dL (ref 30.0–36.0)
MCV: 91.6 fL (ref 78.0–100.0)
MCV: 89.3 fL (ref 78.0–100.0)
Platelets: 56 10*3/uL — ABNORMAL LOW (ref 150–400)
Platelets: 80 10*3/uL — ABNORMAL LOW (ref 150–400)
RBC: 3.22 MIL/uL — AB (ref 4.22–5.81)
RBC: 3.56 MIL/uL — AB (ref 4.22–5.81)
RDW: 18.1 % — ABNORMAL HIGH (ref 11.5–15.5)
RDW: 18.2 % — ABNORMAL HIGH (ref 11.5–15.5)
WBC: 13.1 10*3/uL — AB (ref 4.0–10.5)
WBC: 21 10*3/uL — AB (ref 4.0–10.5)

## 2015-11-13 LAB — BASIC METABOLIC PANEL
ANION GAP: 10 (ref 5–15)
BUN: 33 mg/dL — ABNORMAL HIGH (ref 6–20)
CHLORIDE: 114 mmol/L — AB (ref 101–111)
CO2: 23 mmol/L (ref 22–32)
CREATININE: 1.32 mg/dL — AB (ref 0.61–1.24)
Calcium: 8.6 mg/dL — ABNORMAL LOW (ref 8.9–10.3)
GFR calc non Af Amer: 46 mL/min — ABNORMAL LOW (ref >60)
GFR, EST AFRICAN AMERICAN: 54 mL/min — AB (ref >60)
Glucose, Bld: 121 mg/dL — ABNORMAL HIGH (ref 65–99)
Potassium: 3.8 mmol/L (ref 3.5–5.1)
Sodium: 147 mmol/L — ABNORMAL HIGH (ref 135–145)

## 2015-11-13 LAB — MRSA PCR SCREENING: MRSA BY PCR: NEGATIVE

## 2015-11-13 LAB — GLUCOSE, CAPILLARY: Glucose-Capillary: 122 mg/dL — ABNORMAL HIGH (ref 65–99)

## 2015-11-13 LAB — CULTURE, RESPIRATORY W GRAM STAIN: Culture: NORMAL

## 2015-11-13 LAB — CULTURE, RESPIRATORY

## 2015-11-13 MED ORDER — MIDAZOLAM HCL 2 MG/2ML IJ SOLN
1.0000 mg | INTRAMUSCULAR | Status: DC | PRN
  Administered 2015-11-14 – 2015-11-16 (×3): 1 mg via INTRAVENOUS
  Filled 2015-11-13 (×3): qty 2

## 2015-11-13 MED ORDER — AMIODARONE HCL IN DEXTROSE 360-4.14 MG/200ML-% IV SOLN
INTRAVENOUS | Status: AC
  Administered 2015-11-13: 60 mg/h via INTRAVENOUS
  Filled 2015-11-13: qty 200

## 2015-11-13 MED ORDER — IPRATROPIUM-ALBUTEROL 0.5-2.5 (3) MG/3ML IN SOLN
3.0000 mL | RESPIRATORY_TRACT | Status: DC
  Administered 2015-11-13 – 2015-11-14 (×7): 3 mL via RESPIRATORY_TRACT
  Filled 2015-11-13 (×7): qty 3

## 2015-11-13 MED ORDER — ALBUTEROL SULFATE (2.5 MG/3ML) 0.083% IN NEBU
2.5000 mg | INHALATION_SOLUTION | RESPIRATORY_TRACT | Status: DC | PRN
  Administered 2015-11-13 – 2015-11-23 (×3): 2.5 mg via RESPIRATORY_TRACT
  Filled 2015-11-13 (×3): qty 3

## 2015-11-13 MED ORDER — OMEPRAZOLE 2 MG/ML ORAL SUSPENSION
40.0000 mg | Freq: Every day | ORAL | Status: DC
  Administered 2015-11-14 – 2015-11-23 (×9): 40 mg
  Filled 2015-11-13 (×17): qty 20

## 2015-11-13 MED ORDER — FENTANYL CITRATE (PF) 100 MCG/2ML IJ SOLN
50.0000 ug | INTRAMUSCULAR | Status: DC | PRN
  Administered 2015-11-14: 50 ug via INTRAVENOUS
  Filled 2015-11-13 (×2): qty 2

## 2015-11-13 MED ORDER — FUROSEMIDE 10 MG/ML IJ SOLN
20.0000 mg | Freq: Two times a day (BID) | INTRAMUSCULAR | Status: DC
Start: 2015-11-13 — End: 2015-11-14
  Administered 2015-11-13 – 2015-11-14 (×3): 20 mg via INTRAVENOUS
  Filled 2015-11-13 (×3): qty 2

## 2015-11-13 MED ORDER — BUDESONIDE 0.25 MG/2ML IN SUSP
0.2500 mg | Freq: Two times a day (BID) | RESPIRATORY_TRACT | Status: DC
  Administered 2015-11-13 – 2015-12-01 (×37): 0.25 mg via RESPIRATORY_TRACT
  Filled 2015-11-13 (×40): qty 2

## 2015-11-13 MED ORDER — BOOST / RESOURCE BREEZE PO LIQD
1.0000 | Freq: Three times a day (TID) | ORAL | Status: DC
  Administered 2015-11-13: 1 via ORAL

## 2015-11-13 MED ORDER — DEXTROSE 5 % IV SOLN
2.0000 g | Freq: Every day | INTRAVENOUS | Status: DC
  Administered 2015-11-14 – 2015-11-23 (×10): 2 g via INTRAVENOUS
  Filled 2015-11-13 (×11): qty 2

## 2015-11-13 MED ORDER — MIDAZOLAM HCL 2 MG/2ML IJ SOLN: 1.0000 mg | INTRAMUSCULAR | Status: DC | PRN

## 2015-11-13 MED ORDER — AMIODARONE HCL IN DEXTROSE 360-4.14 MG/200ML-% IV SOLN
30.0000 mg/h | INTRAVENOUS | Status: DC
Start: 2015-11-14 — End: 2015-11-24
  Administered 2015-11-14 – 2015-11-24 (×19): 30 mg/h via INTRAVENOUS
  Filled 2015-11-13 (×29): qty 200

## 2015-11-13 MED ORDER — FENTANYL CITRATE (PF) 100 MCG/2ML IJ SOLN
50.0000 ug | INTRAMUSCULAR | Status: DC | PRN
  Administered 2015-11-13 – 2015-11-19 (×25): 50 ug via INTRAVENOUS
  Filled 2015-11-13 (×24): qty 2

## 2015-11-13 MED ORDER — FUROSEMIDE 10 MG/ML IJ SOLN: 40.0000 mg | Freq: Once | INTRAMUSCULAR | Status: AC

## 2015-11-13 MED ORDER — WHITE PETROLATUM GEL
Status: AC
  Filled 2015-11-13: qty 1

## 2015-11-13 MED ORDER — ARFORMOTEROL TARTRATE 15 MCG/2ML IN NEBU
15.0000 ug | INHALATION_SOLUTION | Freq: Two times a day (BID) | RESPIRATORY_TRACT | Status: DC
  Administered 2015-11-13 – 2015-12-01 (×35): 15 ug via RESPIRATORY_TRACT
  Filled 2015-11-13 (×41): qty 2

## 2015-11-13 MED ORDER — FUROSEMIDE 10 MG/ML IJ SOLN
INTRAMUSCULAR | Status: AC
  Administered 2015-11-13: 08:00:00
  Filled 2015-11-13: qty 4

## 2015-11-13 MED ORDER — AMIODARONE HCL IN DEXTROSE 360-4.14 MG/200ML-% IV SOLN
60.0000 mg/h | INTRAVENOUS | Status: DC
  Administered 2015-11-13 – 2015-11-14 (×2): 60 mg/h via INTRAVENOUS
  Filled 2015-11-13 (×2): qty 200

## 2015-11-13 MED ORDER — AMIODARONE LOAD VIA INFUSION
150.0000 mg | Freq: Once | INTRAVENOUS | Status: AC
  Administered 2015-11-13: 150 mg via INTRAVENOUS

## 2015-11-13 NOTE — Code Documentation (Signed)
  Patient Name: [name] [name]   MRN: [id number]   Date of Birth/ Sex: 1926/08/08 , male      Admission Date: 11/10/2015  Attending Provider: Annita Brod, MD  Primary Diagnosis: <principal problem not specified>   Indication: Pt was in his usual state of health until this PM, when he was noted to be in VFib and unresponsive. Code blue was subsequently called. At the time of arrival on scene, ACLS protocol was underway.   Technical Description:  - CPR performance duration:  8  minutes  - Was defibrillation or cardioversion used? Yes   - Was external pacer placed? No  - Was patient intubated pre/post CPR? Yes   Medications Administered: Y = Yes; Blank = No Amiodarone    Atropine    Calcium    Epinephrine  3  Lidocaine    Magnesium    Norepinephrine    Phenylephrine    Sodium bicarbonate  1  Vasopressin     Post CPR evaluation:  - Final Status - Was patient successfully resuscitated ? Yes - What is current rhythm? A. Fib - What is current hemodynamic status? Stable  Miscellaneous Information:  - Labs sent, including: CXR, ABG, CBC, BMP, Lactate, Troponin  - Primary team notified?  Yes  - Family Notified? Yes  - Additional notes/ transfer status: Transferred to ICU     Maryellen Pile, MD  11/13/2015, 10:55 PM

## 2015-11-13 NOTE — Procedures (Signed)
Intubation Procedure Note DIAZ MATUSIK [place]:[id number] [birth year]  Procedure: Intubation Indications: Respiratory insufficiency  Procedure Details Consent: Unable to obtain consent because of emergent medical necessity. Time Out: Verified patient identification, verified procedure, site/side was marked, verified correct patient position, special equipment/implants available, medications/allergies/relevent history reviewed, required imaging and test results available.  Performed  Maximum sterile technique was used including gloves and mask.  MAC and 4    Evaluation Hemodynamic Status: Transient hypertension requiring treatment; O2 sats: currently acceptable Patient's Current Condition: unstable Complications: No apparent complications Patient did tolerate procedure well. Chest X-ray ordered to verify placement.  CXR: pending. RT placed 8.0 ET tube on first attempt during code. Positive ETCO2 and easy cap.   Dimple Nanas 11/13/2015

## 2015-11-13 NOTE — Progress Notes (Addendum)
PROGRESS NOTE  [name] [name] CVE:[id number] DOB: 1925/12/08 DOA: 11/10/2015 PCP: Odette Fraction, MD  HPI/Recap of past 30 hours: 82 year old male with past medical history of CAD, diastolic CHF, atrial fibrillation and COPD who presented to the emergency room on 3/7 after several days of generalized weakness and confusion plus fever and productive cough with brown sputum. Noted to be febrile in the emergency room plus tachycardia and leukocytosis although normal chest x-ray, normal lactic acid level and good oxygen saturations. Influenza A cultures positive as were blood cultures for gram-positive cocci in chains. Given history of CHF, BNP ordered and came back elevated at 1792.  Following admission, lactic acid level trended upward & given need for agrressive fluids but in CHF, patient moved to stepdown on evening of 3/8.  Over the next few days, lactic acid level slowly improved, but not normalized until 3/10. Patient continued to receive aggressive volume resuscitation until lactic acid level normalized. Has become more tachypneic and labored with his breathing as well as tachycardic. Today, feeling better although having a rough time breathing requiring additional oxygen support. IV fluids stopped and Lasix started  Assessment/Plan: Active Problems: Acute respiratory failure with hypoxia secondary to diastolic heart failure and pneumonia and flu  Acute systolic heart failure [facility]): Echocardiogram noting ejection fraction of 30-35 percent. Now that sepsis stabilized, has stopped IV fluids and started Lasix. Following weights.     Sepsis due to gram positive community acquired-pneumonia [facility]) & influenza: Abx narrowed to Vancomycin, suspect strep.  Have also started Tamiflu.   Patient met criteria for sepsis on admission given positive blood cultures, lactic acidosis, tachypnea, tachycardia and leukocytosis.  Now stabilized    Acute encephalopathy: resolved, back to baseline.  Secondary to  sepsis.    Atrial fibrillation on chronic anticoagulation: Chance to vascular score of 5. Resuming xarelto.  BPH: Resume Flomax  Severe protein calorie malnutrition: Nursing reports patient has no appetite. Patient meets criteria for severe malnutrition in the context of acute illness and injury. Seen by nutrition. Started on boost breeze 3 times a day.  Acute kidney injury in the setting of stage III chronic kidney disease: Initially secondary to hypotension from sepsis. Has been aggressively hydrated. Started on diuretics.   Code Status: full   Family Communication: Left message with family  Disposition Plan: Continue in stepdown, transfer as breathing improves   Consultants:  Critical care  Procedures:  Echo done 3/9: Ejection fraction 30-35 percent  Antibiotics:  IV rocephin 3/7-3/8  IV Vancomycin 3/8-present  IV Zithromax 3/7-present   Objective: BP 150/70 mmHg  Pulse 79  Temp(Src) 97.7 F (36.5 C) (Oral)  Resp 26  Ht 5' 9"  (1.753 m)  Wt 69 kg (152 lb 1.9 oz)  BMI 22.45 kg/m2  SpO2 100%  Intake/Output Summary (Last 24 hours) at 11/13/15 1104 Last data filed at 11/13/15 0857  Gross per 24 hour  Intake   1280 ml  Output   1475 ml  Net   -195 ml   Filed Weights   11/11/15 2000 11/12/15 0500 11/13/15 0500  Weight: 66.2 kg (145 lb 15.1 oz) 64.9 kg (143 lb 1.3 oz) 69 kg (152 lb 1.9 oz)    Exam:   General:  Alert & oriented x 2, NAD , Ventimask  Cardiovascular: Irregular rhythm, mild tachycardia  Respiratory: decreased sounds bibasilar , Persistent end expiratory wheezing bilaterally  Abdomen: soft/nt/nd/+BS   Musculoskeletal: no clubbing, cyanosis, trace pitting edema  Data Reviewed: Basic Metabolic Panel:  Recent Labs Lab 11/10/15  6195 11/10/15 1456 11/11/15 0530 11/13/15 0519  NA 142 142 144 147*  K 4.1 4.2 4.5 3.8  CL 106 107 108 114*  CO2 24 23 26 23   GLUCOSE 155* 200* 165* 121*  BUN 19 21* 23* 33*  CREATININE 1.59* 1.62* 1.37*  1.32*  CALCIUM 9.2 8.8* 9.3 8.6*   Liver Function Tests:  Recent Labs Lab 11/10/15 0910 11/10/15 1456  AST 24 35  ALT 11* 13*  ALKPHOS 60 63  BILITOT 1.1 1.4*  PROT 6.0* 5.7*  ALBUMIN 3.6 3.3*   No results for input(s): LIPASE, AMYLASE in the last 168 hours.  Recent Labs Lab 11/10/15 1456  AMMONIA 25   CBC:  Recent Labs Lab 11/10/15 0910 11/10/15 1456 11/11/15 0530 11/12/15 1102 11/13/15 0519  WBC 14.1* 10.8* 11.8* 19.3* 13.1*  NEUTROABS 4.8 3.9  --   --   --   HGB 10.0* 9.3* 10.1* 10.2* 9.1*  HCT 31.8* 29.9* 32.5* 32.8* 29.5*  MCV 89.3 90.3 90.3 91.4 91.6  PLT 51* 50* 47* 59* 56*   Cardiac Enzymes:   No results for input(s): CKTOTAL, CKMB, CKMBINDEX, TROPONINI in the last 168 hours. BNP (last 3 results)  Recent Labs  11/10/15 1456  BNP 1792.8*    ProBNP (last 3 results) No results for input(s): PROBNP in the last 8760 hours.  CBG:  Recent Labs Lab 11/11/15 0732 11/12/15 0831 11/13/15 0728  GLUCAP 168* 130* 122*    Recent Results (from the past 240 hour(s))  Culture, blood (routine x 2)     Status: None (Preliminary result)   Collection Time: 11/10/15  9:00 AM  Result Value Ref Range Status   Specimen Description BLOOD RIGHT ANTECUBITAL  Final   Special Requests BOTTLES DRAWN AEROBIC AND ANAEROBIC 5CC  Final   Culture  Setup Time   Final    GRAM POSITIVE COCCI IN CHAINS IN BOTH AEROBIC AND ANAEROBIC BOTTLES CRITICAL RESULT CALLED TO, READ BACK BY AND VERIFIED WITH: C CISON,RN @0508  11/11/15 MKELLY    Culture CULTURE REINCUBATED FOR BETTER GROWTH  Final   Report Status PENDING  Incomplete  Culture, blood (routine x 2)     Status: None (Preliminary result)   Collection Time: 11/10/15  9:10 AM  Result Value Ref Range Status   Specimen Description BLOOD RIGHT ARM  Final   Special Requests BOTTLES DRAWN AEROBIC AND ANAEROBIC 5CC  Final   Culture  Setup Time   Final    GRAM POSITIVE COCCI IN CHAINS IN BOTH AEROBIC AND ANAEROBIC  BOTTLES CRITICAL RESULT CALLED TO, READ BACK BY AND VERIFIED WITH: C CISON,RN @0509  11/11/15 MKELLY    Culture CULTURE REINCUBATED FOR BETTER GROWTH  Final   Report Status PENDING  Incomplete  Urine culture     Status: None   Collection Time: 11/10/15 10:21 AM  Result Value Ref Range Status   Specimen Description URINE, CATHETERIZED  Final   Special Requests NONE  Final   Culture 1,000 COLONIES/mL INSIGNIFICANT GROWTH  Final   Report Status 11/11/2015 FINAL  Final  Culture, sputum-assessment     Status: None   Collection Time: 11/10/15  2:11 PM  Result Value Ref Range Status   Specimen Description SPUTUM  Final   Special Requests NONE  Final   Sputum evaluation   Final    THIS SPECIMEN IS ACCEPTABLE. RESPIRATORY CULTURE REPORT TO FOLLOW.   Report Status 11/11/2015 FINAL  Final  Culture, respiratory (NON-Expectorated)     Status: None   Collection Time: 11/10/15  2:11 PM  Result Value Ref Range Status   Specimen Description SPUTUM  Final   Special Requests NONE  Final   Gram Stain   Final    ABUNDANT WBC PRESENT,BOTH PMN AND MONONUCLEAR FEW SQUAMOUS EPITHELIAL CELLS PRESENT FEW GRAM POSITIVE COCCI IN PAIRS FEW GRAM POSITIVE RODS THIS SPECIMEN IS ACCEPTABLE FOR SPUTUM CULTURE Performed at [facility]    Culture   Final    NORMAL OROPHARYNGEAL FLORA Performed at [facility]    Report Status 11/13/2015 FINAL  Final  Gram stain     Status: None   Collection Time: 11/12/15  6:09 PM  Result Value Ref Range Status   Specimen Description TRACHEAL SITE  Final   Special Requests NONE  Final   Gram Stain   Final    ABUNDANT WBC PRESENT,BOTH PMN AND MONONUCLEAR FEW GRAM NEGATIVE RODS RARE GRAM POSITIVE RODS RARE GRAM POSITIVE COCCI IN PAIRS SQUAMOUS EPITHELIAL CELLS PRESENT Gram Stain Report Called to,Read Back By and Verified With: D Rana Snare RN 1944 11/12/15 A BROWNING    Report Status 11/12/2015 FINAL  Final     Studies: No results found.  Scheduled  Meds: . arformoterol  15 mcg Nebulization BID  . budesonide (PULMICORT) nebulizer solution  0.25 mg Nebulization BID  . donepezil  5 mg Oral QHS  . furosemide  20 mg Intravenous Q12H  . ipratropium-albuterol  3 mL Nebulization Q4H  . oseltamivir  30 mg Oral BID  . Rivaroxaban  15 mg Oral QAC supper  . rOPINIRole  4 mg Oral QHS  . simvastatin  10 mg Oral q1800  . tamsulosin  0.4 mg Oral Daily  . vancomycin  1,000 mg Intravenous Q24H    Continuous Infusions:    Time spent: 35 minutes  [facility] Pager [phone] . If 7PM-7AM, please contact night-coverage at [email or web address], password [facility] 11/13/2015, 11:04 AM  LOS: 2 days

## 2015-11-13 NOTE — Progress Notes (Signed)
Physical Therapy Treatment Patient Details Name: [name] [name] MRN: [id number] DOB: 07/02/1926 Today's Date: 11/13/2015    History of Present Illness presented with fever and dyspnea. r/o sepsis. +flu and CHF exacerbation PMHx-HIV, Hepatitis C, CAD, CHF, afib, dementia, COPD    PT Comments    Patient progressing with hallway ambulation this session and, though some SOB noted, pushed himself to walk farther than encouraged.  Patient will continue to benefit from skilled PT in the acute setting and anticipate him to prefer home with HHPT.  Follow Up Recommendations  SNF;Supervision/Assistance - 24 hour (likely to refuse and go home needing HHPT)     Equipment Recommendations  Rolling walker with 5" wheels    Recommendations for Other Services       Precautions / Restrictions Precautions Precautions: Fall    Mobility  Bed Mobility Overal bed mobility: Needs Assistance Bed Mobility: Supine to Sit     Supine to sit: Min assist;HOB elevated Sit to supine: Min assist   General bed mobility comments: cues to use rail, assist for trunk when transitioning hands, to supine assist for positioning/feet in bed  Transfers Overall transfer level: Needs assistance Equipment used: Rolling walker (2 wheeled) Transfers: Sit to/from Stand Sit to Stand: Min assist         General transfer comment: up from edge of bed, cues for scooting to edge of bed first  Ambulation/Gait Ambulation/Gait assistance: Min assist Ambulation Distance (Feet): 140 Feet Assistive device: Rolling walker (2 wheeled) Gait Pattern/deviations: Step-through pattern;Decreased stride length;Trunk flexed     General Gait Details: ambulated on 2L O2 with inconsistent reading during ambulation, but back up to 90% + once not putting pressure on his hands; noted slight dyspnea, but pt pushed on past goal set by PT   Stairs            Wheelchair Mobility    Modified Rankin (Stroke Patients Only)        Balance Overall balance assessment: Needs assistance   Sitting balance-Leahy Scale: Fair     Standing balance support: Bilateral upper extremity supported Standing balance-Leahy Scale: Poor Standing balance comment: UE support needed for balance                    Cognition Arousal/Alertness: Awake/alert Behavior During Therapy: WFL for tasks assessed/performed Overall Cognitive Status: History of cognitive impairments - at baseline                      Exercises      General Comments        Pertinent Vitals/Pain Pain Assessment: No/denies pain    Home Living                      Prior Function            PT Goals (current goals can now be found in the care plan section) Progress towards PT goals: Progressing toward goals    Frequency  Min 3X/week    PT Plan Current plan remains appropriate    Co-evaluation             End of Session Equipment Utilized During Treatment: Gait belt;Oxygen Activity Tolerance: Patient tolerated treatment well Patient left: in bed;with call bell/phone within reach;with bed alarm set     Time: 1415-1440 PT Time Calculation (min) (ACUTE ONLY): 25 min  Charges:  $Gait Training: 8-22 mins $Therapeutic Activity: 8-22 mins  G CodesReginia Naas 11/13/2015, 4:59 PM Magda Kiel, [place] 11/13/2015

## 2015-11-13 NOTE — Progress Notes (Signed)
Paged that patient was unresponsive in ventricular fibrillation. Arrived at bedside and Code Blue resuscitation per ACL in progress. Patient intubated and successfully defibrillated. Given amiodarone and placed in drip. Stable hemodynamics and rhythm atrial fibrillation - see code blue documentation.  Transferred to 68M - bed 12. CCM managing care

## 2015-11-13 NOTE — Progress Notes (Addendum)
Initial Nutrition Assessment  DOCUMENTATION CODES:   Severe malnutrition in context of acute illness/injury  INTERVENTION:    Offer Boost Breeze PO TID, each supplement provides 250 kcal and 9 grams of protein  Recommend diet advancement to regular as able, patient unhappy with clear liquids and requests solid food.  NUTRITION DIAGNOSIS:   Malnutrition related to acute illness as evidenced by moderate depletions of muscle mass, severe depletion of muscle mass, energy intake < or equal to 50% for > or equal to 5 days.  GOAL:   Patient will meet greater than or equal to 90% of their needs  MONITOR:   Diet advancement, PO intake, Supplement acceptance, Labs, I & O's, Weight trends  REASON FOR ASSESSMENT:   Consult Assessment of nutrition requirement/status  ASSESSMENT:   82 year old male with past medical history of CAD, diastolic CHF, atrial fibrillation, and COPD who presented to the emergency room on 3/7 after several days of generalized weakness and confusion plus fever and productive cough with brown sputum. Influenza A cultures positive as were blood cultures for gram-positive cocci in chains.   Patient somewhat confused during RD visit. When asked how he is eating, he stated, "I can't eat that stuff!" but was unable to state why. He has been eating 0% of meals since admission; intake < 50% of estimated energy requirement for at least the past 5 days.   Per review of usual weights, weight stable at 152 lbs since September, 2016. Weight down to ~145 lbs at beginning of admission, likely decreased with dehydration, back up to 152 lbs today.   Per discussion with RN, patient has been eating nothing on his trays, currently on clear liquids. He asked for coffee this AM, but not really drinking much either.   Nutrition-Focused physical exam completed. Findings are mild-moderate fat depletion, severe muscle depletion, and no edema.   Labs reviewed: sodium elevated.   Diet  Order:  Diet Heart Room service appropriate?: Yes; Fluid consistency:: Thin  Skin:  Reviewed, no issues  Last BM:  3/7  Height:   Ht Readings from Last 1 Encounters:  11/11/15 5\' 9"  (1.753 m)    Weight:   Wt Readings from Last 1 Encounters:  11/13/15 152 lb 1.9 oz (69 kg)    Ideal Body Weight:  72.7 kg  BMI:  Body mass index is 22.45 kg/(m^2).  Estimated Nutritional Needs:   Kcal:  1800-2000  Protein:  90-110 gm  Fluid:  1.8-2 L  EDUCATION NEEDS:   No education needs identified at this time  Molli Barrows, [place], [place], [place] Pager [phone] After Hours Pager [phone]

## 2015-11-13 NOTE — Progress Notes (Signed)
Dr. Lyman Speller notified by text of patient's respiratory status.

## 2015-11-13 NOTE — NC FL2 (Signed)
[place] LEVEL OF CARE SCREENING TOOL     IDENTIFICATION  Patient Name: [name] [name] Birthdate: [birth year] Sex: male Admission Date (Current Location): 11/10/2015  [facility] and [place] Number:  Herbalist and Address:  [facility], [place] [address], [place], [place] [address]      Provider Number: [id number]  Attending Physician Name and Address:  Annita Brod, MD  Relative Name and Phone Number:       Current Level of Care: Hospital Recommended Level of Care: [place] Prior Approval Number:    Date Approved/Denied: 11/13/15 PASRR Number: [id number]  Discharge Plan: SNF    Current Diagnoses: Patient Active Problem List   Diagnosis Date Noted  . Protein-calorie malnutrition, severe ([place]) 11/13/2015  . Acute systolic heart failure ([place]) 11/10/2015  . Sepsis due to pneumonia ([place]) 11/10/2015  . Acute encephalopathy   . Influenza with pneumonia   . Hypotension   . Encounter for therapeutic drug monitoring 10/09/2013  . Rotator cuff tear arthropathy of right shoulder 09/04/2013  . Back pain, acute 06/14/2013  . Right shoulder pain 05/03/2013  . Chronic pain in left shoulder 05/03/2013  . Abnormal CT scan, chest 01/10/2013  . Anticoagulated on Coumadin 01/01/2013  . Left shoulder pain 01/01/2013  . CHF (congestive heart failure) ([place]) 01/01/2013  . Permanent atrial fibrillation ([place]) 11/02/2012  . Lymphocytosis   . Balanitis 09/11/2012  . UTI (lower urinary tract infection) 09/05/2012  . Shortness of breath 09/05/2012  . Altered mental status 09/05/2012  . Dementia 08/14/2012  . Acute low back pain 07/02/2012  . Cellulitis of right leg 05/28/2012  . Hematoma 05/17/2012  . Leg pain 05/17/2012  . Pacemaker-St.Jude 04/26/2012  . Eczema 03/14/2012  . Right carotid bruit 01/06/2012  . Long term current use of anticoagulant 09/15/2011  . Thrombocytopenia ([place]) 09/15/2011  . Preventative health care  09/10/2011  . CONSTIPATION 06/01/2010  . CLOSED DISLOCATION OF ACROMIOCLAVICULAR 06/01/2010  . Abdominal aortic aneurysm ([place]) 01/28/2010  . COSTOCHONDRITIS 10/22/2009  . Cholesterolosis of gallbladder 07/08/2009  . CHOLELITHIASIS 06/30/2009  . MOBITZ II ATRIOVENTRICULAR BLOCK 06/17/2009  . ATRIAL FIBRILLATION, PAROXYSMAL 06/17/2009  . CAD 01/16/2009  . HYPERTENSION, BENIGN 12/31/2008  . BACK PAIN, CHRONIC 12/31/2008  . Edema 12/31/2008  . HYPOTHYROIDISM 11/05/2008  . VITAMIN D DEFICIENCY 11/05/2008  . UNS ADVRS EFF OTH RX MEDICINAL&BIOLOGICAL SBSTNC 11/05/2008  . ERECTILE DYSFUNCTION 09/02/2008  . ARTHRITIS, HIP 09/02/2008  . RESTLESS LEG SYNDROME 12/27/2007  . INSOMNIA 12/27/2007  . HYPERLIPIDEMIA 08/15/2007  . CORONARY ATHEROSCLEROSIS, ARTERY BYPASS GRAFT 08/15/2007  . COPD 08/15/2007  . GERD 08/15/2007  . FREQUENCY, URINARY 08/15/2007  . BPH (benign prostatic hypertrophy) 08/15/2007    Orientation RESPIRATION BLADDER Height & Weight     Self  O2 (2 liters) Indwelling catheter Weight: 152 lb 1.9 oz (69 kg) Height:  5\' 9"  (175.3 cm)  BEHAVIORAL SYMPTOMS/MOOD NEUROLOGICAL BOWEL NUTRITION STATUS      Continent Diet  AMBULATORY STATUS COMMUNICATION OF NEEDS Skin   Extensive Assist Verbally Normal                       Personal Care Assistance Level of Assistance  Bathing, Feeding, Dressing Bathing Assistance: Maximum assistance Feeding assistance: Independent Dressing Assistance: Maximum assistance     Functional Limitations Info  Sight, Hearing, Speech Sight Info: Adequate Hearing Info: Adequate Speech Info: Adequate    SPECIAL CARE FACTORS FREQUENCY  PT (By licensed PT), OT (  By licensed OT)                    Contractures      Additional Factors Info  Code Status, Allergies Code Status Info: FULL Allergies Info: Amoxicillin, Penicillins           Current Medications (11/13/2015):  This is the current hospital active medication  list Current Facility-Administered Medications  Medication Dose Route Frequency Provider Last Rate Last Dose  . albuterol (PROVENTIL) (2.5 MG/3ML) 0.083% nebulizer solution 2.5 mg  2.5 mg Nebulization Q4H PRN Annita Brod, MD   2.5 mg at 11/13/15 0757  . arformoterol (BROVANA) nebulizer solution 15 mcg  15 mcg Nebulization BID Annita Brod, MD   15 mcg at 11/13/15 0949  . budesonide (PULMICORT) nebulizer solution 0.25 mg  0.25 mg Nebulization BID Annita Brod, MD   0.25 mg at 11/13/15 0949  . donepezil (ARICEPT) tablet 5 mg  5 mg Oral QHS Annita Brod, MD   5 mg at 11/12/15 2242  . feeding supplement (BOOST / RESOURCE BREEZE) liquid 1 Container  1 Container Oral TID BM Annita Brod, MD   1 Container at 11/13/15 1115  . furosemide (LASIX) injection 20 mg  20 mg Intravenous Q12H Annita Brod, MD   20 mg at 11/13/15 1106  . ipratropium-albuterol (DUONEB) 0.5-2.5 (3) MG/3ML nebulizer solution 3 mL  3 mL Nebulization Q4H Annita Brod, MD   3 mL at 11/13/15 1144  . oseltamivir (TAMIFLU) 6 MG/ML suspension 30 mg  30 mg Oral BID Annita Brod, MD   30 mg at 11/13/15 0930  . Rivaroxaban (XARELTO) tablet 15 mg  15 mg Oral QAC supper Annita Brod, MD   15 mg at 11/12/15 1654  . rOPINIRole (REQUIP) tablet 4 mg  4 mg Oral QHS Annita Brod, MD   4 mg at 11/12/15 2242  . simvastatin (ZOCOR) tablet 10 mg  10 mg Oral q1800 Annita Brod, MD   10 mg at 11/12/15 1654  . tamsulosin (FLOMAX) capsule 0.4 mg  0.4 mg Oral Daily Annita Brod, MD   0.4 mg at 11/13/15 0930  . vancomycin (VANCOCIN) IVPB 1000 mg/200 mL premix  1,000 mg Intravenous Q24H Franky Macho, RPH   1,000 mg at 11/13/15 [id number]     Discharge Medications: Please see discharge summary for a list of discharge medications.  Relevant Imaging Results:  Relevant Lab Results:   Additional Information SS#: [id number]  Raymondo Band, LCSW

## 2015-11-13 NOTE — Consult Note (Signed)
PULMONARY / CRITICAL CARE MEDICINE   Name: [name] [name] MRN: [id number] DOB: Apr 17, 1926    ADMISSION DATE:  11/10/2015 CONSULTATION DATE:  11/13/2015  REFERRING MD:  Lyman Speller  CHIEF COMPLAINT:  VF arrest, hypoxemic respiratory failure  HISTORY OF PRESENT ILLNESS:   45M admitted 11/10/2015 with acute hypoxemic respiratory failure secondary to acute influenza A. Subsequent workup also revealed positive blood cultures with GPCs in chains. He has been treated with vanc, rocephin and azithro as well as tamiflu. He was given IVF, but has a history of HFpEF and developed worsening respiratory distress. He has a significant cardiac history with prior A fib and is s/p pacemaker placement. He was noted to have non-sustained VT on the monitor earlier in the admission. 3/10 around 10:30pm he was noted to be in VF on the monitor and was found unresponsive and pulseless. Code Blue was called and per report (documentation not yet reviewed) he received CPR, 3 rounds of epi, was shocked twice and given an amp of HCO3. Pulses returned prior to amiodarone bolus being given. He was moved to 26M. His family was contacted; his HCPOA is unable to come to the hospital. His daughter is en route from [place].   [place] is significant for COPD, prior CABG, A fib and Mobitz II heart block s/p pacemaker placement, chronic leukocytosis, congestive heart failure, dementia. He moved in with his sister a few months ago and she is his 53.   PAST MEDICAL HISTORY :  He  has a past medical history of CAD (coronary artery disease); Hypertension; Vitamin D deficiency; Chronic back pain; Anemia; ED (erectile dysfunction); Arthritis; RLS (restless legs syndrome); GERD (gastroesophageal reflux disease); COPD (chronic obstructive pulmonary disease) ([place]); Hyperlipidemia; Atrial fibrillation ([place]); Second degree Mobitz II AV block; Asthma (09/15/2011); Long term current use of anticoagulant (09/15/2011); Thrombocytopenia ([place]) (09/15/2011);  Lymphocytosis; CHF (congestive heart failure) ([place]) (01/01/2013); and Allergy.  PAST SURGICAL HISTORY: He  has past surgical history that includes Coronary artery bypass graft; Cataract extraction; and Pacemaker insertion.  Allergies  Allergen Reactions  . Amoxicillin Itching and Rash  . Penicillins Itching and Rash    Has patient had a PCN reaction causing immediate rash, facial/tongue/throat swelling, SOB or lightheadedness with hypotension: Yes Has patient had a PCN reaction causing severe rash involving mucus membranes or skin necrosis: No Has patient had a PCN reaction that required hospitalization No Has patient had a PCN reaction occurring within the last 10 years: No If all of the above answers are "NO", then may proceed with Cephalosporin use.     No current facility-administered medications on file prior to encounter.   Current Outpatient Prescriptions on File Prior to Encounter  Medication Sig  . albuterol (PROVENTIL HFA;VENTOLIN HFA) 108 (90 BASE) MCG/ACT inhaler Inhale 2 puffs into the lungs every 6 (six) hours as needed. For wheezing or shortness of breath  . cyclobenzaprine (FLEXERIL) 5 MG tablet Take 1 tablet (5 mg total) by mouth 3 (three) times daily as needed for muscle spasms.  Marland Kitchen donepezil (ARICEPT) 5 MG tablet Take 1 tablet (5 mg total) by mouth at bedtime.  Marland Kitchen FLONASE 50 MCG/ACT nasal spray Place 1 spray into both nostrils daily.  . furosemide (LASIX) 40 MG tablet Take 1 tablet (40 mg total) by mouth daily.  Marland Kitchen gabapentin (NEURONTIN) 100 MG capsule TAKE TWO CAPSULES BY MOUTH AT BEDTIME  . isosorbide mononitrate (IMDUR) 30 MG 24 hr tablet Take 0.5 tablets (15 mg total) by mouth every morning.  . Multiple Vitamin (  MULTIVITAMIN) tablet Take 1 tablet by mouth every morning.   . pantoprazole (PROTONIX) 40 MG tablet Take 1 tablet (40 mg total) by mouth every morning.  Marland Kitchen rOPINIRole (REQUIP) 4 MG tablet Take 1 tablet (4 mg total) by mouth at bedtime.  . simvastatin (ZOCOR)  10 MG tablet TAKE ONE TABLET BY MOUTH ONCE DAILY AT 6 PM.  . tamsulosin (FLOMAX) 0.4 MG CAPS capsule TAKE ONE CAPSULE BY MOUTH ONCE DAILY  . XARELTO 15 MG TABS tablet TAKE ONE TABLET BY MOUTH ONCE DAILY WITH SUPPER    FAMILY HISTORY:  His indicated that his mother is deceased. He indicated that his father is deceased. He indicated that his sister is alive. He indicated that his brother is deceased. He indicated that his maternal grandmother is deceased. He indicated that his maternal grandfather is deceased. He indicated that his paternal grandmother is deceased. He indicated that his paternal grandfather is deceased.   SOCIAL HISTORY: He  reports that he quit smoking about 7 years ago. His smoking use included Cigarettes. He has a 240 pack-year smoking history. He has never used smokeless tobacco. He reports that he does not drink alcohol or use illicit drugs.  REVIEW OF SYSTEMS:   Unable to obtain 2/2 intubation  VITAL SIGNS: BP 118/61 mmHg  Pulse 60  Temp(Src) 98.4 F (36.9 C) (Axillary)  Resp 19  Ht 5\' 9"  (1.753 m)  Wt 69 kg (152 lb 1.9 oz)  BMI 22.45 kg/m2  SpO2 100%  HEMODYNAMICS:    VENTILATOR SETTINGS: Vent Mode:  [-]  FiO2 (%):  [28 %-32 %] 28 %  INTAKE / OUTPUT: I/O last 3 completed shifts: In: 1620 [P.O.:520; I.V.:1100] Out: 3200 [Urine:3200]  PHYSICAL EXAMINATION: Physical Exam: Temp:  [97.7 F (36.5 C)-98.4 F (36.9 C)] 98.4 F (36.9 C) (03/10 2005) Pulse Rate:  [45-98] 45 (03/10 2308) Resp:  [18-26] 21 (03/10 2308) BP: (116-150)/(56-98) 118/61 mmHg (03/10 2005) SpO2:  [87 %-100 %] 98 % (03/10 2308) FiO2 (%):  [28 %-100 %] 100 % (03/10 2308) Weight:  [69 kg (152 lb 1.9 oz)] 69 kg (152 lb 1.9 oz) (03/10 0500)  General Well nourished, well developed, no apparent distress, thin elderly male.   HEENT No gross abnormalities.ETT in place.   Pulmonary Tachypneic. Coarse breath sounds bilaterally. No wheezes, rales or ronchi. Good effort. Symmetrical  expansion.    Cardiovascular Regular rhythm. S1, s2. No m/r/g. Distal pulses palpable.  Abdomen Soft, non-tender, non-distended, positive bowel sounds, no palpable organomegaly or masses. Normoresonant to percussion.  Musculoskeletal No bony abnormalities  Lymphatics No cervical, supraclavicular or axillary adenopathy.   Neurologic Arouses to voice, opens eyes spontaneously. Moves all extremities. No focal deficits.   Skin/Integuement No rash, no cyanosis, no clubbing. Ecchymoses over bilateral upper extremities.    LABS:  BMET  Recent Labs Lab 11/10/15 1456 11/11/15 0530 11/13/15 0519  NA 142 144 147*  K 4.2 4.5 3.8  CL 107 108 114*  CO2 23 26 23   BUN 21* 23* 33*  CREATININE 1.62* 1.37* 1.32*  GLUCOSE 200* 165* 121*    Electrolytes  Recent Labs Lab 11/10/15 1456 11/11/15 0530 11/13/15 0519  CALCIUM 8.8* 9.3 8.6*    CBC  Recent Labs Lab 11/11/15 0530 11/12/15 1102 11/13/15 0519  WBC 11.8* 19.3* 13.1*  HGB 10.1* 10.2* 9.1*  HCT 32.5* 32.8* 29.5*  PLT 47* 59* 56*    Coag's  Recent Labs Lab 11/10/15 1456  APTT 32  INR 1.35    Sepsis Markers  Recent Labs Lab 11/10/15 1456 11/11/15 1504 11/12/15 0412 11/12/15 1526  LATICACIDVEN 2.6* 4.6* 3.3* 1.8  PROCALCITON 2.14  --   --   --     ABG No results for input(s): PHART, PCO2ART, PO2ART in the last 168 hours.  Liver Enzymes  Recent Labs Lab 11/10/15 0910 11/10/15 1456  AST 24 35  ALT 11* 13*  ALKPHOS 60 63  BILITOT 1.1 1.4*  ALBUMIN 3.6 3.3*    Cardiac Enzymes No results for input(s): TROPONINI, PROBNP in the last 168 hours.  Glucose  Recent Labs Lab 11/11/15 0732 11/12/15 0831 11/13/15 0728  GLUCAP 168* 130* 122*    Imaging No results found.   STUDIES:  3/9 TTE  - EF 30-35%, Moderate diffuse hypokinesis. Regional wall  motion abnormalities cannot be excluded. - Ventricular septum: Septal motion showed moderate dyssynergy.  These changes are consistent with right  ventricular pacing. - Mitral valve: Calcified annulus. There was moderate  regurgitation. Valve area by pressure half-time: 1.71 cm^2. - Left atrium: The atrium was severely dilated. - Right ventricle: The cavity size was mildly dilated. Wall  thickness was normal. Systolic function was mildly reduced. - Right atrium: The atrium was moderately to severely dilated. - Pulmonary arteries: Systolic pressure was moderately increased.  PA peak pressure: 59 mm Hg (S).  CULTURES: Blood 3/7 >> GPC in chains, GPR,  Urine 3/7 >> NGTD Sputum 3/7 >> normal flora  ANTIBIOTICS: Tamiflu 3/7 >> Vancomycin 3/7 >> Ceftriaxone 3/7 >> 3/9; 3/10 >> Azithromycin 3/7>>3/9  SIGNIFICANT EVENTS: VF arrest   LINES/TUBES: ETT / OGT 3/10 Foley 3/10  DISCUSSION: Mr. Lapiana is an 57M s/p VF arrest in the setting of sepsis secondary to Influenza A and bacteremia with GPC in chains, decompensated mixed CHF, underlying COPD, history of A fib on xarelto, prior CABG, history of mobitz II heart block, and dementia.    ASSESSMENT / PLAN:  PULMONARY A: Acute hypoxemic respiratory failure  P:   Continue ventilatory support Wean FiO2 as able SBTs when able  Continue treatment of underlying infections  CARDIOVASCULAR A:  VF arrest  Non-sustained VT Decompensated systolic and diastolic CHF Hx A fib on Xarelto Hx CAD s/p CABG Hx Mobitz II heart block  Pacemaker P:  Amiodarone load and bolus Follow labs; correct lytes ECG Trend troponins Cardiology to see (consult called to Dr. Eula Fried) Interrogate pacemaker Hold Xarelto Therapeutic lovenox in AM Diurese  RENAL A:   Metabolic acidosis 2/2 arrest  CKD II-III (baseline Cr ~1.2) P:   Trend lytes, creatinine Avoid nephrotoxins  GASTROINTESTINAL A:   No acute issues P:   NPO for now PPI for gi ppx while intubated  HEMATOLOGIC A:   Chronic leukocytosis - worsened Chronic thrombocytopenia - stable  P:  Trend CBC  INFECTIOUS A:    Sepsis secondary to influenza A, bacteremia (GPC in chains) P:   Continue oseltamivir Continue vancomycin Add back ceftriaxone Follow cultures  ENDOCRINE A:   No acute issues P:   Accuchecks and SSI as needed  NEUROLOGIC A:   Hx dementia P:   RASS goal: 0, -1 Follow exam Will defer hypothermia given patient age, active infection, current neuro exam and hx of dementia Target 36 degrees - avoid fever  FAMILY  - Updates: sister and daughter updated via phone  - Inter-disciplinary family meet or Palliative Care meeting due by:  day 7  The patient is critically ill with multiple organ system failure and requires high complexity decision making for assessment and support,  frequent evaluation and titration of therapies, advanced monitoring, review of radiographic studies and interpretation of complex data.   Critical Care Time devoted to patient care services, exclusive of separately billable procedures, described in this note is 70 minutes.   Yisroel Ramming, MD Pulmonary and [place] Pager: [phone] 11/13/2015, 11:05 PM

## 2015-11-13 NOTE — Progress Notes (Signed)
Dr. Lyman Speller notified by text of critical lab value:  Gram positive rods and cocci in blood cultures.

## 2015-11-13 NOTE — Progress Notes (Signed)
RT, Kal, recommends the following:  Scheduled duoneb q 4 hours; brovana bid; and pulmicort 0.5mg  bid.  These recommendations texted to Dr. Lyman Speller.

## 2015-11-14 ENCOUNTER — Inpatient Hospital Stay (HOSPITAL_COMMUNITY): Payer: Medicare Other

## 2015-11-14 DIAGNOSIS — I5021 Acute systolic (congestive) heart failure: Secondary | ICD-10-CM

## 2015-11-14 DIAGNOSIS — I4729 Other ventricular tachycardia: Secondary | ICD-10-CM | POA: Insufficient documentation

## 2015-11-14 DIAGNOSIS — A419 Sepsis, unspecified organism: Secondary | ICD-10-CM

## 2015-11-14 DIAGNOSIS — I472 Ventricular tachycardia: Secondary | ICD-10-CM | POA: Insufficient documentation

## 2015-11-14 DIAGNOSIS — I4901 Ventricular fibrillation: Secondary | ICD-10-CM

## 2015-11-14 DIAGNOSIS — J189 Pneumonia, unspecified organism: Secondary | ICD-10-CM

## 2015-11-14 DIAGNOSIS — L8990 Pressure ulcer of unspecified site, unspecified stage: Secondary | ICD-10-CM | POA: Insufficient documentation

## 2015-11-14 DIAGNOSIS — J9601 Acute respiratory failure with hypoxia: Secondary | ICD-10-CM

## 2015-11-14 DIAGNOSIS — I469 Cardiac arrest, cause unspecified: Secondary | ICD-10-CM | POA: Insufficient documentation

## 2015-11-14 LAB — BLOOD GAS, ARTERIAL
ACID-BASE EXCESS: 0 mmol/L (ref 0.0–2.0)
BICARBONATE: 24.6 meq/L — AB (ref 20.0–24.0)
Drawn by: 236041
FIO2: 1
LHR: 18 {breaths}/min
O2 SAT: 99.8 %
PATIENT TEMPERATURE: 98.6
PEEP: 5 cmH2O
PH ART: 7.378 (ref 7.350–7.450)
TCO2: 25.9 mmol/L (ref 0–100)
VT: 550 mL
pCO2 arterial: 42.7 mmHg (ref 35.0–45.0)
pO2, Arterial: 294 mmHg — ABNORMAL HIGH (ref 80.0–100.0)

## 2015-11-14 LAB — BASIC METABOLIC PANEL
Anion gap: 14 (ref 5–15)
Anion gap: 11 (ref 5–15)
BUN: 31 mg/dL — AB (ref 6–20)
BUN: 31 mg/dL — AB (ref 6–20)
CALCIUM: 8.2 mg/dL — AB (ref 8.9–10.3)
CHLORIDE: 106 mmol/L (ref 101–111)
CHLORIDE: 108 mmol/L (ref 101–111)
CO2: 24 mmol/L (ref 22–32)
CO2: 26 mmol/L (ref 22–32)
CREATININE: 1.47 mg/dL — AB (ref 0.61–1.24)
CREATININE: 1.57 mg/dL — AB (ref 0.61–1.24)
Calcium: 8.2 mg/dL — ABNORMAL LOW (ref 8.9–10.3)
GFR calc Af Amer: 47 mL/min — ABNORMAL LOW (ref >60)
GFR calc Af Amer: 44 mL/min — ABNORMAL LOW (ref >60)
GFR calc non Af Amer: 41 mL/min — ABNORMAL LOW (ref >60)
GFR calc non Af Amer: 38 mL/min — ABNORMAL LOW (ref >60)
GLUCOSE: 188 mg/dL — AB (ref 65–99)
Glucose, Bld: 273 mg/dL — ABNORMAL HIGH (ref 65–99)
POTASSIUM: 3.4 mmol/L — AB (ref 3.5–5.1)
Potassium: 3.1 mmol/L — ABNORMAL LOW (ref 3.5–5.1)
Sodium: 144 mmol/L (ref 135–145)
Sodium: 145 mmol/L (ref 135–145)

## 2015-11-14 LAB — GLUCOSE, CAPILLARY
Glucose-Capillary: 236 mg/dL — ABNORMAL HIGH (ref 65–99)
Glucose-Capillary: 220 mg/dL — ABNORMAL HIGH (ref 65–99)
Glucose-Capillary: 143 mg/dL — ABNORMAL HIGH (ref 65–99)
Glucose-Capillary: 111 mg/dL — ABNORMAL HIGH (ref 65–99)

## 2015-11-14 LAB — TROPONIN I
Troponin I: 0.05 ng/mL — ABNORMAL HIGH (ref <0.031)
Troponin I: 6.03 ng/mL (ref <0.031)
Troponin I: 7.34 ng/mL (ref <0.031)

## 2015-11-14 LAB — LACTIC ACID, PLASMA
Lactic Acid, Venous: 4.3 mmol/L (ref 0.5–2.0)
Lactic Acid, Venous: 1.9 mmol/L (ref 0.5–2.0)

## 2015-11-14 LAB — VANCOMYCIN, TROUGH: Vancomycin Tr: 12 ug/mL (ref 10.0–20.0)

## 2015-11-14 LAB — APTT: aPTT: 88 seconds — ABNORMAL HIGH (ref 24–37)

## 2015-11-14 LAB — MAGNESIUM: Magnesium: 2.4 mg/dL (ref 1.7–2.4)

## 2015-11-14 LAB — HEPARIN LEVEL (UNFRACTIONATED): Heparin Unfractionated: 1.86 IU/mL — ABNORMAL HIGH (ref 0.30–0.70)

## 2015-11-14 MED ORDER — FUROSEMIDE 10 MG/ML IJ SOLN
20.0000 mg | Freq: Every day | INTRAMUSCULAR | Status: DC
  Administered 2015-11-15: 20 mg via INTRAVENOUS
  Filled 2015-11-14 (×2): qty 2

## 2015-11-14 MED ORDER — INSULIN ASPART 100 UNIT/ML ~~LOC~~ SOLN
0.0000 [IU] | SUBCUTANEOUS | Status: DC
  Administered 2015-11-14: 2 [IU] via SUBCUTANEOUS
  Administered 2015-11-14: 5 [IU] via SUBCUTANEOUS
  Administered 2015-11-14: 2 [IU] via SUBCUTANEOUS
  Administered 2015-11-14: 5 [IU] via SUBCUTANEOUS
  Administered 2015-11-15 – 2015-11-18 (×15): 2 [IU] via SUBCUTANEOUS
  Administered 2015-11-20: 3 [IU] via SUBCUTANEOUS
  Administered 2015-11-21 (×2): 2 [IU] via SUBCUTANEOUS
  Administered 2015-11-21: 3 [IU] via SUBCUTANEOUS
  Administered 2015-11-22 (×2): 2 [IU] via SUBCUTANEOUS
  Administered 2015-11-22 – 2015-11-23 (×3): 3 [IU] via SUBCUTANEOUS
  Administered 2015-11-23: 2 [IU] via SUBCUTANEOUS
  Administered 2015-11-23: 3 [IU] via SUBCUTANEOUS
  Administered 2015-11-24: 2 [IU] via SUBCUTANEOUS
  Administered 2015-11-24: 3 [IU] via SUBCUTANEOUS

## 2015-11-14 MED ORDER — VANCOMYCIN HCL 10 G IV SOLR
1250.0000 mg | INTRAVENOUS | Status: DC
  Administered 2015-11-14: 1250 mg via INTRAVENOUS
  Filled 2015-11-14 (×2): qty 1250

## 2015-11-14 MED ORDER — HEPARIN (PORCINE) IN NACL 100-0.45 UNIT/ML-% IJ SOLN
1400.0000 [IU]/h | INTRAMUSCULAR | Status: AC
  Administered 2015-11-14 – 2015-11-15 (×2): 1000 [IU]/h via INTRAVENOUS
  Administered 2015-11-16: 1150 [IU]/h via INTRAVENOUS
  Administered 2015-11-17 – 2015-11-21 (×3): 1200 [IU]/h via INTRAVENOUS
  Administered 2015-11-23: 1500 [IU]/h via INTRAVENOUS
  Administered 2015-11-24: 1400 [IU]/h via INTRAVENOUS
  Filled 2015-11-14 (×14): qty 250

## 2015-11-14 MED ORDER — CHLORHEXIDINE GLUCONATE 0.12% ORAL RINSE (MEDLINE KIT)
15.0000 mL | Freq: Two times a day (BID) | OROMUCOSAL | Status: DC
  Administered 2015-11-14 – 2015-12-01 (×25): 15 mL via OROMUCOSAL
  Filled 2015-11-14: qty 15

## 2015-11-14 MED ORDER — IPRATROPIUM-ALBUTEROL 0.5-2.5 (3) MG/3ML IN SOLN
3.0000 mL | RESPIRATORY_TRACT | Status: DC | PRN
  Administered 2015-11-20 – 2015-11-21 (×2): 3 mL via RESPIRATORY_TRACT
  Filled 2015-11-14 (×3): qty 3

## 2015-11-14 MED ORDER — POTASSIUM CHLORIDE 10 MEQ/100ML IV SOLN
10.0000 meq | INTRAVENOUS | Status: DC
  Administered 2015-11-14 (×6): 10 meq via INTRAVENOUS
  Filled 2015-11-14 (×6): qty 100

## 2015-11-14 MED ORDER — PRO-STAT SUGAR FREE PO LIQD
30.0000 mL | Freq: Two times a day (BID) | ORAL | Status: DC
  Administered 2015-11-14 – 2015-11-23 (×14): 30 mL
  Filled 2015-11-14 (×19): qty 30

## 2015-11-14 MED ORDER — VITAL AF 1.2 CAL PO LIQD
1000.0000 mL | ORAL | Status: DC
  Administered 2015-11-14 – 2015-11-16 (×3): 1000 mL
  Filled 2015-11-14 (×3): qty 1000

## 2015-11-14 MED ORDER — ANTISEPTIC ORAL RINSE SOLUTION (CORINZ)
7.0000 mL | Freq: Four times a day (QID) | OROMUCOSAL | Status: DC
  Administered 2015-11-14 – 2015-11-30 (×39): 7 mL via OROMUCOSAL

## 2015-11-14 NOTE — Consult Note (Signed)
PULMONARY / CRITICAL CARE MEDICINE   Name: [name] [name] MRN: [id number] DOB: 02-06-1926    ADMISSION DATE:  11/10/2015 CONSULTATION DATE:  11/13/2015  REFERRING MD:  Lyman Speller  CHIEF COMPLAINT:  VF arrest, hypoxemic respiratory failure  HISTORY OF PRESENT ILLNESS:   3M admitted 11/10/2015 with acute hypoxemic respiratory failure secondary to acute influenza A. Subsequent workup also revealed positive blood cultures with GPCs in chains. He has been treated with vanc, rocephin and azithro as well as tamiflu. He was given IVF, but has a history of HFpEF and developed worsening respiratory distress. He has a significant cardiac history with prior A fib and is s/p pacemaker placement. He was noted to have non-sustained VT on the monitor earlier in the admission. 3/10 around 10:30pm he was noted to be in VF on the monitor and was found unresponsive and pulseless. Code Blue was called and per report (documentation not yet reviewed) he received CPR, 3 rounds of epi, was shocked twice and given an amp of HCO3. Pulses returned prior to amiodarone bolus being given. He was moved to 88M. His family was contacted; his HCPOA is unable to come to the hospital. His daughter is en route from [place].   [place] is significant for COPD, prior CABG, A fib and Mobitz II heart block s/p pacemaker placement, chronic leukocytosis, congestive heart failure, dementia. He moved in with his sister a few months ago and she is his 52.   Subjective:  pt is alert and following commands on vent  Weaning this am -good vol on PS 5/5  Cards consult this am with Acute CHF-decreased EF on echo/VF arrest w/ elevated troponin  VITAL SIGNS: BP 106/64 mmHg  Pulse 69  Temp(Src) 98.6 F (37 C) (Axillary)  Resp 14  Ht 5\' 9"  (1.753 m)  Wt 156 lb 4.9 oz (70.9 kg)  BMI 23.07 kg/m2  SpO2 100%  HEMODYNAMICS:    VENTILATOR SETTINGS: Vent Mode:  [-] CPAP;PSV FiO2 (%):  [28 %-100 %] 40 % Set Rate:  [18 bmp] 18 bmp Vt Set:  [550 mL]  550 mL PEEP:  [5 cmH20] 5 cmH20 Pressure Support:  [5 cmH20] 5 cmH20 Plateau Pressure:  [14 cmH20-23 cmH20] 14 cmH20  INTAKE / OUTPUT: I/O last 3 completed shifts: In: 2223.2 [P.O.:460; I.V.:1313.2; IV Piggyback:450] Out: 2665 [Urine:2665]  PHYSICAL EXAMINATION: Physical Exam: Temp:  [96.8 F (36 C)-99.3 F (37.4 C)] 98.6 F (37 C) (03/11 0715) Pulse Rate:  [45-100] 69 (03/11 0834) Resp:  [14-26] 14 (03/11 0834) BP: (84-146)/(46-88) 106/64 mmHg (03/11 0834) SpO2:  [95 %-100 %] 100 % (03/11 0836) FiO2 (%):  [28 %-100 %] 40 % (03/11 0836) Weight:  [156 lb 4.9 oz (70.9 kg)] 156 lb 4.9 oz (70.9 kg) (03/10 2315)  General , thin elderly male on vent   HEENT No gross abnormalities.ETT in place.   Pulmonary Tachypneic. Coarse breath sounds bilaterally. No wheezes, rales or ronchi. Good effort. Symmetrical expansion.    Cardiovascular Regular rhythm. S1, s2. No m/r/g. Distal pulses palpable.  Abdomen Soft, non-tender, non-distended, positive bowel sounds, no palpable organomegaly or masses. Normoresonant to percussion.  Musculoskeletal No bony abnormalities  Lymphatics No cervical, supraclavicular or axillary adenopathy.   Neurologic Arouses to voice, opens eyes spontaneously. Moves all extremities. No focal deficits.  Followed simple commands   Skin/Integuement No rash, no cyanosis, no clubbing. Ecchymoses over bilateral upper extremities.    LABS:  BMET  Recent Labs Lab 11/13/15 0519 11/13/15 2317 11/14/15 0314  NA 147* 144  145  K 3.8 3.4* 3.1*  CL 114* 106 108  CO2 23 24 26   BUN 33* 31* 31*  CREATININE 1.32* 1.47* 1.57*  GLUCOSE 121* 188* 273*    Electrolytes  Recent Labs Lab 11/13/15 0519 11/13/15 2317 11/14/15 0314  CALCIUM 8.6* 8.2* 8.2*  MG  --  2.4  --     CBC  Recent Labs Lab 11/12/15 1102 11/13/15 0519 11/13/15 2317  WBC 19.3* 13.1* 21.0*  HGB 10.2* 9.1* 9.7*  HCT 32.8* 29.5* 31.8*  PLT 59* 56* 80*    Coag's  Recent Labs Lab  11/10/15 1456  APTT 32  INR 1.35    Sepsis Markers  Recent Labs Lab 11/10/15 1456  11/12/15 1526 11/13/15 2316 11/14/15 0306  LATICACIDVEN 2.6*  < > 1.8 4.3* 1.9  PROCALCITON 2.14  --   --   --   --   < > = values in this interval not displayed.  ABG  Recent Labs Lab 11/13/15 2350  PHART 7.378  PCO2ART 42.7  PO2ART 294*    Liver Enzymes  Recent Labs Lab 11/10/15 0910 11/10/15 1456  AST 24 35  ALT 11* 13*  ALKPHOS 60 63  BILITOT 1.1 1.4*  ALBUMIN 3.6 3.3*    Cardiac Enzymes  Recent Labs Lab 11/13/15 2317 11/14/15 0310  TROPONINI 0.05* 6.03*    Glucose  Recent Labs Lab 11/11/15 0732 11/12/15 0831 11/13/15 0728 11/14/15 0347  GLUCAP 168* 130* 122* 236*    Imaging Dg Chest Port 1 View  11/14/2015  CLINICAL DATA:  Cardiac arrest.  Intubation. EXAM: PORTABLE CHEST 1 VIEW COMPARISON:  11/10/2015 FINDINGS: Endotracheal tube 5.7 cm from the carina. Enteric tube tip below the diaphragm not included in field of view. Patient is post median sternotomy. Left-sided pacemaker in place. Cardiomegaly is unchanged. Development of diffuse perihilar and suprahilar right lung opacities, new from prior. Left lung grossly clear. No evidence of pneumothorax. Old left rib fractures. IMPRESSION: 1. Endotracheal tube 5.7 cm from the carina.  Enteric tube in place. 2. Development of right perihilar and suprahilar opacities, may reflect asymmetric pulmonary edema, pneumonia or aspiration. Cardiomegaly is stable. Electronically Signed   By: Jeb Levering M.D.   On: 11/14/2015 00:27   Dg Abd Portable 1v  11/14/2015  CLINICAL DATA:  Orogastric tube placement. EXAM: PORTABLE ABDOMEN - 1 VIEW COMPARISON:  10/24/2012 FINDINGS: Tip and side port of the enteric tube below the diaphragm in the stomach, however looped and tip directed towards the fundus. Air-filled transverse colon. No definite small bowel dilatation. IMPRESSION: Enteric tube below the stomach, however tip directed  towards the fundus. Nonobstructive bowel gas pattern. Electronically Signed   By: Jeb Levering M.D.   On: 11/14/2015 00:28     STUDIES:  3/9 TTE  - EF 30-35%, Moderate diffuse hypokinesis. Regional wall  motion abnormalities cannot be excluded. - Ventricular septum: Septal motion showed moderate dyssynergy.  These changes are consistent with right ventricular pacing. - Mitral valve: Calcified annulus. There was moderate  regurgitation. Valve area by pressure half-time: 1.71 cm^2. - Left atrium: The atrium was severely dilated. - Right ventricle: The cavity size was mildly dilated. Wall  thickness was normal. Systolic function was mildly reduced. - Right atrium: The atrium was moderately to severely dilated. - Pulmonary arteries: Systolic pressure was moderately increased.  PA peak pressure: 59 mm Hg (S).  CULTURES: Blood 3/7 >> GPC in chains, GPR,  Urine 3/7 >> NGTD Sputum 3/7 >> normal flora  ANTIBIOTICS: Tamiflu 3/7 >>  Vancomycin 3/7 >> Ceftriaxone 3/7 >> 3/9; 3/10 >> Azithromycin 3/7>>3/9  SIGNIFICANT EVENTS: VF arrest 3/10 Card Consult 3/10   LINES/TUBES: ETT / OGT 3/10 Foley 3/10  DISCUSSION: Mr. Segovia is an 20M s/p VF arrest in the setting of sepsis secondary to Influenza A and bacteremia with GPC in chains, decompensated mixed CHF, underlying COPD, history of A fib on xarelto, prior CABG, history of mobitz II heart block, and dementia.    ASSESSMENT / PLAN:  PULMONARY A: Acute hypoxemic respiratory failure  ?Asymmetrical Pulmonary Edema vs aspiration PNA  P:   Continue ventilatory support Wean FiO2 as able SBTs when able  Continue treatment of underlying infections Check cxr today  See ID [id number]   CARDIOVASCULAR A:  VF arrest  3/10 Non-sustained VT Decompensated systolic and diastolic CHF Hx A fib on Xarelto Hx CAD s/p CABG Hx Mobitz II heart block  Pacemaker Elevated Troponin  P:  Cont Amiodarone  Appreciate Cardiology consult  ? Hold  Xarelto. Begin Heparin  Cont lasix   RENAL A:   Metabolic acidosis 2/2 arrest  CKD II-III (baseline Cr ~1.2) P:   Trend lytes, creatinine Avoid nephrotoxins  GASTROINTESTINAL A:   No acute issues P:   TF consult  PPI for gi ppx while intubated  HEMATOLOGIC A:   Chronic leukocytosis - worsened Chronic thrombocytopenia - stable  Anemia  P:  Trend CBC Transfuse per protocol   INFECTIOUS A:   Sepsis secondary to influenza A, bacteremia (GPC in chains) P:   Continue oseltamivir Continue vancomycin Cont ceftriaxone Follow cultures  ENDOCRINE A:   Hyperglycemia  P:   Cont SSI   NEUROLOGIC A:   Hx dementia S/p arrest 3/10 >hypothermia deferred given patient age, active infection, current neuro exam and hx of dementia  P:   RASS goal: 0, -1 Follow exam  Target 36 degrees - avoid fever  FAMILY  - Updates: sister and daughter updated via phone  - Inter-disciplinary family meet or Palliative Care meeting due by:  day 7  Idamay Hosein NP-C  [facility]  [phone]   11/14/2015, 8:57 AM

## 2015-11-14 NOTE — Consult Note (Addendum)
CARDIOLOGY INPATIENT CONSULTATION NOTE  Patient ID: [name] [name] MRN: [id number], DOB/AGE: July 04, 1926   Admit date: 11/10/2015   Primary Physician: Odette Fraction, MD Primary Cardiologist: Thompson Grayer MD  Reason for Consult:   [name] [name] arrest  Requesting Physician: Germain Osgood MD  HPI: This is a 82 y.o.white male with atrial fibrillation on coumadin, second degree AV block Mobitz II s/p [employer] DDD dual chamber PPM (basal rate 60 bpm), history of CAD s/p CABG, HTN, HLD, COPD, chronic diastolic CHF and thrombocytopenia who presented with acute respiratory failure on 11/10/2015. Patient is admitted under internal medicine service with acute hypoxemic respiratory failure secondary to acute influenza A virus and was found to have GPC bacteremia and is being treated with Abx. Patient on admission was having ectopy and was found to have new low LVEF of 30-35% on 3/9 echo. Today at 2241 hours patient was found to have polymorphic VT on the monitor. He became unresponsive and pulseless per report. Patient received CPR, 3 rounds of epinephrine and was shocked x 2. He was also given a dose of bicarb. Amiodarone bolus and drip were started. ROSC was obtained after 10 minutes.  Patient's daughter was contacted and she arrived from [place]. She is recovering from cancer, her husband cannot walk and her aunt who lives nearby cannot leave her husband at night to come to see him.  Patient is not responding purposefully. He is not following commands.    Problem List: Past Medical History  Diagnosis Date  . CAD (coronary artery disease)   . Hypertension   . Vitamin D deficiency   . Chronic back pain   . Anemia   . ED (erectile dysfunction)   . Arthritis   . RLS (restless legs syndrome)   . GERD (gastroesophageal reflux disease)   . COPD (chronic obstructive pulmonary disease) ([place])   . Hyperlipidemia   . Atrial fibrillation ([place])   . Second degree Mobitz II AV block     s/p PPM  . Asthma  09/15/2011  . Long term current use of anticoagulant 09/15/2011  . Thrombocytopenia ([place]) 09/15/2011  . Lymphocytosis   . CHF (congestive heart failure) ([place]) 01/01/2013  . Allergy     Past Surgical History  Procedure Laterality Date  . Coronary artery bypass graft    . Cataract extraction    . Pacemaker insertion       Allergies:  Allergies  Allergen Reactions  . Amoxicillin Itching and Rash  . Penicillins Itching and Rash    Has patient had a PCN reaction causing immediate rash, facial/tongue/throat swelling, SOB or lightheadedness with hypotension: Yes Has patient had a PCN reaction causing severe rash involving mucus membranes or skin necrosis: No Has patient had a PCN reaction that required hospitalization No Has patient had a PCN reaction occurring within the last 10 years: No If all of the above answers are "NO", then may proceed with Cephalosporin use.      Home Medications Current Facility-Administered Medications  Medication Dose Route Frequency Provider Last Rate Last Dose  . albuterol (PROVENTIL) (2.5 MG/3ML) 0.083% nebulizer solution 2.5 mg  2.5 mg Nebulization Q4H PRN Annita Brod, MD   2.5 mg at 11/13/15 0757  . amiodarone (NEXTERONE PREMIX) 360 MG/200ML (1.8 mg/mL) IV infusion  60 mg/hr Intravenous Continuous Dannielle Burn, MD 33.3 mL/hr at 11/13/15 2318 60 mg/hr at 11/13/15 2318   Followed by  . amiodarone (NEXTERONE PREMIX) 360 MG/200ML (1.8 mg/mL) IV infusion  30 mg/hr Intravenous  Continuous Dannielle Burn, MD      . arformoterol [facility]) nebulizer solution 15 mcg  15 mcg Nebulization BID Annita Brod, MD   15 mcg at 11/13/15 2100  . budesonide (PULMICORT) nebulizer solution 0.25 mg  0.25 mg Nebulization BID Annita Brod, MD   0.25 mg at 11/13/15 2100  . cefTRIAXone (ROCEPHIN) 2 g in dextrose 5 % 50 mL IVPB  2 g Intravenous QHS Dannielle Burn, MD      . donepezil (ARICEPT) tablet 5 mg  5 mg Oral QHS Annita Brod, MD   5 mg  at 11/13/15 2154  . feeding supplement (BOOST / RESOURCE BREEZE) liquid 1 Container  1 Container Oral TID BM Annita Brod, MD   1 Container at 11/13/15 2153  . fentaNYL (SUBLIMAZE) injection 50 mcg  50 mcg Intravenous Q15 min PRN Dannielle Burn, MD      . fentaNYL (SUBLIMAZE) injection 50 mcg  50 mcg Intravenous Q2H PRN Dannielle Burn, MD   50 mcg at 11/13/15 2337  . furosemide (LASIX) injection 20 mg  20 mg Intravenous Q12H Annita Brod, MD   20 mg at 11/13/15 2154  . ipratropium-albuterol (DUONEB) 0.5-2.5 (3) MG/3ML nebulizer solution 3 mL  3 mL Nebulization Q4H Annita Brod, MD   3 mL at 11/13/15 2057  . midazolam (VERSED) injection 1 mg  1 mg Intravenous Q15 min PRN Dannielle Burn, MD      . midazolam (VERSED) injection 1 mg  1 mg Intravenous Q2H PRN Dannielle Burn, MD      . omeprazole (PRILOSEC) 2 mg/mL oral suspension SUSP 40 mg  40 mg Per Tube Daily Dannielle Burn, MD      . oseltamivir (TAMIFLU) 6 MG/ML suspension 30 mg  30 mg Oral BID Annita Brod, MD   30 mg at 11/13/15 2154  . Rivaroxaban (XARELTO) tablet 15 mg  15 mg Oral QAC supper Annita Brod, MD   15 mg at 11/13/15 1755  . rOPINIRole (REQUIP) tablet 4 mg  4 mg Oral QHS Annita Brod, MD   4 mg at 11/13/15 2154  . simvastatin (ZOCOR) tablet 10 mg  10 mg Oral q1800 Annita Brod, MD   10 mg at 11/13/15 1754  . tamsulosin (FLOMAX) capsule 0.4 mg  0.4 mg Oral Daily Annita Brod, MD   0.4 mg at 11/13/15 0930  . vancomycin (VANCOCIN) IVPB 1000 mg/200 mL premix  1,000 mg Intravenous Q24H Franky Macho, RPH   1,000 mg at 11/13/15 0535  . white petrolatum (VASELINE) gel              Family History  Problem Relation Age of Onset  . Diabetes Sister   . Cancer Mother     lung cancer  . Stroke Father   . Cancer Brother     brain cancer  . Alcohol abuse Brother      Social History   Social History  . Marital Status: Widowed    Spouse Name: N/A  . Number of  Children: 2  . Years of Education: N/A   Occupational History  .      retired Hydrologist   Social History Main Topics  . Smoking status: Former Smoker -- 3.00 packs/day for 80 years    Types: Cigarettes    Quit date: 09/05/2008  . Smokeless tobacco: Never Used  . Alcohol Use: No     Comment: occasional 4  drinks per week  . Drug Use: No  . Sexual Activity: Not Currently   Other Topics Concern  . Not on file   Social History Narrative     Review of Systems: General: fatigue   Cardiovascular: trace swelling, no chest pain Dermatological: negative for rash Respiratory: productive cough and dyspnea negative for wheezing  Urologic: negative for hematuria Abdominal: negative for nausea, vomiting, diarrhea, bright red blood per rectum, melena, or hematemesis Neurologic: negative for visual changes, syncope, or dizziness Hematology: no bleeding, bruises Psychiatry: non suicidal/homicidal  Musculoskeletal: unable to obtain due to intubated/sedated  Physical Exam: Vitals: BP 146/88 mmHg  Pulse 100  Temp(Src) 96.8 F (36 C) (Axillary)  Resp 26  Ht 5\' 9"  (1.753 m)  Wt 69 kg (152 lb 1.9 oz)  BMI 22.45 kg/m2  SpO2 100% General: intubated and sedated, agitated, has mittens on hands Neck: JVP mildly elevated Heart: irregular rate and rhythm, S1, S2 normal, no murmurs  Lungs: crackles bilaterally  GI: non tender, non distended, bowel sounds present Extremities: no edema Neuro: intubated/sedated Psych: intubated/sedated   Labs:   Results for orders placed or performed during the hospital encounter of 11/10/15 (from the past 24 hour(s))  CBC     Status: Abnormal   Collection Time: 11/13/15  5:19 AM  Result Value Ref Range   WBC 13.1 (H) 4.0 - 10.5 K/uL   RBC 3.22 (L) 4.22 - 5.81 MIL/uL   Hemoglobin 9.1 (L) 13.0 - 17.0 g/dL   HCT 29.5 (L) 39.0 - 52.0 %   MCV 91.6 78.0 - 100.0 fL   MCH 28.3 26.0 - 34.0 pg   MCHC 30.8 30.0 - 36.0 g/dL   RDW 18.1 (H) 11.5 - 15.5 %    Platelets 56 (L) 150 - 400 K/uL  Basic metabolic panel     Status: Abnormal   Collection Time: 11/13/15  5:19 AM  Result Value Ref Range   Sodium 147 (H) 135 - 145 mmol/L   Potassium 3.8 3.5 - 5.1 mmol/L   Chloride 114 (H) 101 - 111 mmol/L   CO2 23 22 - 32 mmol/L   Glucose, Bld 121 (H) 65 - 99 mg/dL   BUN 33 (H) 6 - 20 mg/dL   Creatinine, Ser 1.32 (H) 0.61 - 1.24 mg/dL   Calcium 8.6 (L) 8.9 - 10.3 mg/dL   GFR calc non Af Amer 46 (L) >60 mL/min   GFR calc Af Amer 54 (L) >60 mL/min   Anion gap 10 5 - 15  Glucose, capillary     Status: Abnormal   Collection Time: 11/13/15  7:28 AM  Result Value Ref Range   Glucose-Capillary 122 (H) 65 - 99 mg/dL  MRSA PCR Screening     Status: None   Collection Time: 11/13/15  9:10 AM  Result Value Ref Range   MRSA by PCR NEGATIVE NEGATIVE  Lactic acid, plasma     Status: Abnormal   Collection Time: 11/13/15 11:16 PM  Result Value Ref Range   Lactic Acid, Venous 4.3 (HH) 0.5 - 2.0 mmol/L  Basic metabolic panel     Status: Abnormal   Collection Time: 11/13/15 11:17 PM  Result Value Ref Range   Sodium 144 135 - 145 mmol/L   Potassium 3.4 (L) 3.5 - 5.1 mmol/L   Chloride 106 101 - 111 mmol/L   CO2 24 22 - 32 mmol/L   Glucose, Bld 188 (H) 65 - 99 mg/dL   BUN 31 (H) 6 - 20 mg/dL  Creatinine, Ser 1.47 (H) 0.61 - 1.24 mg/dL   Calcium 8.2 (L) 8.9 - 10.3 mg/dL   GFR calc non Af Amer 41 (L) >60 mL/min   GFR calc Af Amer 47 (L) >60 mL/min   Anion gap 14 5 - 15  CBC     Status: Abnormal   Collection Time: 11/13/15 11:17 PM  Result Value Ref Range   WBC 21.0 (H) 4.0 - 10.5 K/uL   RBC 3.56 (L) 4.22 - 5.81 MIL/uL   Hemoglobin 9.7 (L) 13.0 - 17.0 g/dL   HCT 31.8 (L) 39.0 - 52.0 %   MCV 89.3 78.0 - 100.0 fL   MCH 27.2 26.0 - 34.0 pg   MCHC 30.5 30.0 - 36.0 g/dL   RDW 18.2 (H) 11.5 - 15.5 %   Platelets 80 (L) 150 - 400 K/uL  Magnesium     Status: None   Collection Time: 11/13/15 11:17 PM  Result Value Ref Range   Magnesium 2.4 1.7 - 2.4 mg/dL    Troponin I     Status: Abnormal   Collection Time: 11/13/15 11:17 PM  Result Value Ref Range   Troponin I 0.05 (H) <0.031 ng/mL  Blood gas, arterial     Status: Abnormal   Collection Time: 11/13/15 11:50 PM  Result Value Ref Range   FIO2 1.00    Delivery systems VENTILATOR    Mode PRESSURE REGULATED VOLUME CONTROL    VT 550 mL   LHR 18 resp/min   Peep/cpap 5.0 cm H20   pH, Arterial 7.378 7.350 - 7.450   pCO2 arterial 42.7 35.0 - 45.0 mmHg   pO2, Arterial 294 (H) 80.0 - 100.0 mmHg   Bicarbonate 24.6 (H) 20.0 - 24.0 mEq/L   TCO2 25.9 0 - 100 mmol/L   Acid-Base Excess 0.0 0.0 - 2.0 mmol/L   O2 Saturation 99.8 %   Patient temperature 98.6    Collection site LEFT BRACHIAL    Drawn by [id number]    Sample type ARTERIAL DRAW    Allens test (pass/fail) PASS PASS     Radiology/Studies: Dg Chest 2 View  11/10/2015  CLINICAL DATA:  Weakness and difficulty walking since 4 p.m. yesterday. Initial encounter. EXAM: CHEST  2 VIEW COMPARISON:  PA and lateral chest 12/24/2012. CT chest, abdomen and pelvis 10/24/2012. FINDINGS: There is cardiomegaly without edema. The chest is hyperexpanded. Minimal left basilar atelectasis is noted. No pneumothorax or pleural effusion is seen. No focal bony abnormality is identified. The patient is status post CABG with a pacing device in place. Remote left rib fractures are identified. IMPRESSION: Cardiomegaly without edema. Mild left basilar atelectasis. Emphysema. Electronically Signed   By: Inge Rise M.D.   On: 11/10/2015 08:40   Dg Chest Port 1 View  11/10/2015  CLINICAL DATA:  Shortness breath, cough for 2-3 days EXAM: PORTABLE CHEST 1 VIEW COMPARISON:  11/10/2015 FINDINGS: There is mild left basilar scarring. There is no focal consolidation. There is no pleural effusion or pneumothorax. There is stable cardiomegaly. There is prior CABG. There is a cardiac pacemaker present. The osseous structures are unremarkable. IMPRESSION: No active disease. Electronically  Signed   By: Kathreen Devoid   On: 11/10/2015 13:43   CARDIOGRPAHICS I personally reviewed and analyzed the following data:   EKG: atrial fibrillation with V rate of 89 bpm, RBBB and left axis deviation, left anterior hemiblock, inferior remote MI and PVCs. T wave abnormality in the lateral leads   Telemetry strips showed polymorphic VT, short long sequence  followed by R on T phenomenon that led to polymorphic VT  Echo: 11/12/2015 Left ventricle: The cavity size was normal. There was mild  concentric hypertrophy. Systolic function was moderately to  severely reduced. The estimated ejection fraction was in the  range of 30% to 35%. Moderate diffuse hypokinesis. Regional wall  motion abnormalities cannot be excluded. - Ventricular septum: Septal motion showed moderate dyssynergy.  These changes are consistent with right ventricular pacing. - Mitral valve: Calcified annulus. There was moderate  regurgitation. Valve area by pressure half-time: 1.71 cm^2. - Left atrium: The atrium was severely dilated. - Right ventricle: The cavity size was mildly dilated. Wall  thickness was normal. Systolic function was mildly reduced. - Right atrium: The atrium was moderately to severely dilated. - Pulmonary arteries: Systolic pressure was moderately increased.  PA peak pressure: 59 mm Hg (S).  Cardiac cath: not available  Medical decision making:  Discussed care with the patient Discussed care with the physician on the phone Reviewed labs and imaging personally Reviewed prior records  ASSESSMENT AND PLAN:  This is a 82 y.o. male with  atrial fibrillation on coumadin, second degree AV block Mobitz II s/p PPM, history of CAD s/p CABG, HTN, HLD, COPD, dementia, chronic diastolic CHF and thrombocytopenia who presented with acute respiratory failure on 11/10/2015.  Active Problems:   Acute systolic heart failure (HCC)   Sepsis due to pneumonia ([place])   Acute encephalopathy   Influenza with  pneumonia   Hypotension   Protein-calorie malnutrition, severe (HCC)  VF arrest - unclear etiology but suspect CAD related  - cycle troponin, discuss cardiac catheterization tomorrow morning once patient shows signs of improvement, post resuscitation EKG does not demonstrate ST elevation, however suspect CAD as etiology - aspirin, high dose statin, if troponin elevated start on IV heparin, continue amiodarone load, check daily EKGs to evaluate QT duration, check TSH/LFTs for baseline - avoid MAP >90 and <65, monitor lactic acid, avoid beta blocker - Keep potassium >4, calcium >9, magnesium >2  Acute systolic cardiomyopathy (New onset cardiomyopathy) - echo today yesterday showed LVEF of 30-35%, new since 2010 Will need discussion to proceed ahead with invasive assessment such as cardiac catheterization since patient has dementia, AKI and has not been responding purposefully post cardiac arrest. Currently harm appears to be greater than the benefit and this was discussed with the daughter who showed understanding.   Acute pulmonary edema - consider gentle diuresis with IV lasix, if hypotensive, support with vasopressors (levophed)  Moderate Pulmonary hypertension - PASP 59 mmhg, treat HF, RHC w/LHC to confirm   Bacteremia with unknown source - continue Abx therapy, follow ID recs, may need TEE if does not clear cultures    Signed, Flossie Dibble, MD MS 11/14/2015, 12:07 AM

## 2015-11-14 NOTE — Clinical Social Work Note (Signed)
Clinical Social Work Assessment  Patient Details  Name: [name] [name] MRN: [place]:[id number] Date of Birth: 18-Feb-1926  Date of referral:  11/14/15               Reason for consult:  Facility Placement                Permission sought to share information with:    Permission granted to share information::  Yes, Verbal Permission Granted  Name::     [name]  Agency::     Relationship::  Sister  Contact Information:  [phone]  Housing/Transportation Living arrangements for the past 2 months:  Single Family Home (With Sister) Source of Information:  Other (Comment Required) (Sibling) Patient Interpreter Needed:  None Criminal Activity/Legal Involvement Pertinent to Current Situation/Hospitalization:  No - Comment as needed Significant Relationships:  Siblings Lives with:  Siblings Do you feel safe going back to the place where you live?  Yes Need for family participation in patient care:  Yes (Comment)  Care giving concerns:  Patient's sister Inez Catalina informed CSW that she is not able to care for the patient at home. Sister reports her husband was just discharged from hospital after having surgery and she has had a difficult time with him. Sister reports she feels it will be best for patient to get a little rehab before returning home.    Social Worker assessment / plan:  CSW received consult by Therapist, sports. CSW went to speak with patient regarding the possible need for short term rehab. CSW introduced self and acknowledged the patient. Patient was not oriented, however sister was at bedside. CSW informed patient's sister Inez Catalina of PT recommendation for short term rehab. Sister is agreeable to SNF placement. CSW provided patient with a list of SNF facilities, and was provided permission to fax clinical information out to the facilities in [place], [place], and [place]. Sister's preference is the [facility] of [place]. CSW to initiate SNF placement process.    Employment status:   Disabled (Comment on whether or not currently receiving Disability) Insurance information:  Managed Medicare PT Recommendations:  [place] / Referral to community resources:  [place]  Patient/Family's Response to care:  Sister is agreeable to SNF placement for the patient.   Patient/Family's Understanding of and Emotional Response to Diagnosis, Current Treatment, and Prognosis:  Sister is very involved in the patient's care. Sister believes the patient would benefit most from going for short term rehab before returning home. Sister was appreciative of CSW services and intervention.   Emotional Assessment Appearance:  Appears stated age Attitude/Demeanor/Rapport:   (Unable to assess) Affect (typically observed):   (Unable to assess) Orientation:  Fluctuating Orientation (Suspected and/or reported Sundowners) Alcohol / Substance use:  Not Applicable Psych involvement (Current and /or in the community):  No (Comment)  Discharge Needs  Concerns to be addressed:  Decision making concerns, Discharge Planning Concerns Readmission within the last 30 days:  No Current discharge risk:  Physical Impairment, Chronically ill Barriers to Discharge:  Continued Medical Work up   [employer], LCSW 11/14/2015, 4:10 PM

## 2015-11-14 NOTE — Progress Notes (Signed)
ANTICOAGULATION CONSULT NOTE - Initial Consult  Pharmacy Consult for heparin Indication: chest pain/ACS  Allergies  Allergen Reactions  . Amoxicillin Itching and Rash  . Penicillins Itching and Rash    Has patient had a PCN reaction causing immediate rash, facial/tongue/throat swelling, SOB or lightheadedness with hypotension: Yes Has patient had a PCN reaction causing severe rash involving mucus membranes or skin necrosis: No Has patient had a PCN reaction that required hospitalization No Has patient had a PCN reaction occurring within the last 10 years: No If all of the above answers are "NO", then may proceed with Cephalosporin use.     Patient Measurements: Height: 5\' 9"  (175.3 cm) Weight: 156 lb 4.9 oz (70.9 kg) IBW/kg (Calculated) : 70.7 Heparin Dosing Weight: 71 kg  Vital Signs: Temp: 98.6 F (37 C) (03/11 0900) Temp Source: Core (Comment) (03/11 0400) BP: 108/59 mmHg (03/11 0900) Pulse Rate: 74 (03/11 0900)  Labs:  Recent Labs  11/12/15 1102 11/13/15 0519 11/13/15 2317 11/14/15 0310 11/14/15 0314 11/14/15 1051  HGB 10.2* 9.1* 9.7*  --   --   --   HCT 32.8* 29.5* 31.8*  --   --   --   PLT 59* 56* 80*  --   --   --   CREATININE  --  1.32* 1.47*  --  1.57*  --   TROPONINI  --   --  0.05* 6.03*  --  7.34*    Estimated Creatinine Clearance: 32.5 mL/min (by C-G formula based on Cr of 1.57).   Medical History: Past Medical History  Diagnosis Date  . CAD (coronary artery disease)   . Hypertension   . Vitamin D deficiency   . Chronic back pain   . Anemia   . ED (erectile dysfunction)   . Arthritis   . RLS (restless legs syndrome)   . GERD (gastroesophageal reflux disease)   . COPD (chronic obstructive pulmonary disease) ([place])   . Hyperlipidemia   . Atrial fibrillation ([place])   . Second degree Mobitz II AV block     s/p PPM  . Asthma 09/15/2011  . Long term current use of anticoagulant 09/15/2011  . Thrombocytopenia ([place]) 09/15/2011  . Lymphocytosis    . CHF (congestive heart failure) ([place]) 01/01/2013  . Allergy     Medications:  Prescriptions prior to admission  Medication Sig Dispense Refill Last Dose  . albuterol (PROVENTIL HFA;VENTOLIN HFA) 108 (90 BASE) MCG/ACT inhaler Inhale 2 puffs into the lungs every 6 (six) hours as needed. For wheezing or shortness of breath 18 g 11 11/09/2015 at Unknown time  . cyclobenzaprine (FLEXERIL) 5 MG tablet Take 1 tablet (5 mg total) by mouth 3 (three) times daily as needed for muscle spasms. 90 tablet 2 Past Week at Unknown time  . donepezil (ARICEPT) 5 MG tablet Take 1 tablet (5 mg total) by mouth at bedtime. 90 tablet 1 11/09/2015 at Unknown time  . FLONASE 50 MCG/ACT nasal spray Place 1 spray into both nostrils daily. 1 g 2 11/09/2015 at Unknown time  . furosemide (LASIX) 40 MG tablet Take 1 tablet (40 mg total) by mouth daily. 30 tablet 6 11/09/2015 at Unknown time  . gabapentin (NEURONTIN) 100 MG capsule TAKE TWO CAPSULES BY MOUTH AT BEDTIME 90 capsule 3 11/09/2015 at Unknown time  . isosorbide mononitrate (IMDUR) 30 MG 24 hr tablet Take 0.5 tablets (15 mg total) by mouth every morning. 45 tablet 3 11/09/2015 at Unknown time  . Multiple Vitamin (MULTIVITAMIN) tablet Take 1 tablet by  mouth every morning.    11/09/2015 at Unknown time  . pantoprazole (PROTONIX) 40 MG tablet Take 1 tablet (40 mg total) by mouth every morning. 90 tablet 0 11/09/2015 at Unknown time  . rOPINIRole (REQUIP) 4 MG tablet Take 1 tablet (4 mg total) by mouth at bedtime. 90 tablet 1 11/09/2015 at Unknown time  . simvastatin (ZOCOR) 10 MG tablet TAKE ONE TABLET BY MOUTH ONCE DAILY AT 6 PM. 90 tablet 0 11/09/2015 at Unknown time  . tamsulosin (FLOMAX) 0.4 MG CAPS capsule TAKE ONE CAPSULE BY MOUTH ONCE DAILY 90 capsule 3 11/09/2015 at Unknown time  . XARELTO 15 MG TABS tablet TAKE ONE TABLET BY MOUTH ONCE DAILY WITH SUPPER 30 tablet 3 11/09/2015 at Unknown time  . [DISCONTINUED] albuterol (PROVENTIL HFA;VENTOLIN HFA) 108 (90 BASE) MCG/ACT inhaler Inhale 2  puffs into the lungs every 6 (six) hours as needed for wheezing or shortness of breath. (Patient not taking: Reported on 11/10/2015) 1 Inhaler 2   . [DISCONTINUED] ipratropium (ATROVENT) 0.06 % nasal spray Place 2 sprays into both nostrils 4 (four) times daily. (Patient not taking: Reported on 11/10/2015) 15 mL 12   . [DISCONTINUED] pantoprazole (PROTONIX) 40 MG tablet TAKE ONE TABLET BY MOUTH EVERY MORNING (Patient not taking: Reported on 11/10/2015) 90 tablet 3   . [DISCONTINUED] tiotropium (SPIRIVA HANDIHALER) 18 MCG inhalation capsule Place 1 capsule (18 mcg total) into inhaler and inhale daily. (Patient not taking: Reported on 11/10/2015) 30 capsule 5 Taking    Assessment: 82 yo male with VF arrest on 3/10 > ROSC. Now with elevated troponin. Pt is on Xarleto PTA for history of AFib (last dose was 3/10 @ 1800). Pt will be transitioned to a heparin gtt. Hemoglobin is stable, plts fluctuate and are low at baseline, currently up to 80K.   Goal of Therapy:  Heparin level 0.3-0.7 units/ml Monitor platelets by anticoagulation protocol: Yes   Plan:  -Heparin 1000 units/hr -Daily HL, CBC -Monitor s/sx bleeding -HL in 8 hours -Watch plts  Harvel Quale 11/14/2015,1:31 PM

## 2015-11-14 NOTE — Progress Notes (Signed)
ANTICOAGULATION CONSULT NOTE - follow-up Consult  Pharmacy Consult for heparin Indication: chest pain/ACS  Allergies  Allergen Reactions  . Amoxicillin Itching and Rash  . Penicillins Itching and Rash    Has patient had a PCN reaction causing immediate rash, facial/tongue/throat swelling, SOB or lightheadedness with hypotension: Yes Has patient had a PCN reaction causing severe rash involving mucus membranes or skin necrosis: No Has patient had a PCN reaction that required hospitalization No Has patient had a PCN reaction occurring within the last 10 years: No If all of the above answers are "NO", then may proceed with Cephalosporin use.     Patient Measurements: Height: 5\' 9"  (175.3 cm) Weight: 156 lb 4.9 oz (70.9 kg) IBW/kg (Calculated) : 70.7 Heparin Dosing Weight: 71 kg  Vital Signs: Temp: 99.9 F (37.7 C) (03/11 2300) Temp Source: Core (Comment) (03/11 2200) BP: 112/55 mmHg (03/11 2300) Pulse Rate: 61 (03/11 2300)  Labs:  Recent Labs  11/12/15 1102 11/13/15 0519 11/13/15 2317 11/14/15 0310 11/14/15 0314 11/14/15 1051 11/14/15 2146  HGB 10.2* 9.1* 9.7*  --   --   --   --   HCT 32.8* 29.5* 31.8*  --   --   --   --   PLT 59* 56* 80*  --   --   --   --   APTT  --   --   --   --   --   --  88*  HEPARINUNFRC  --   --   --   --   --   --  1.86*  CREATININE  --  1.32* 1.47*  --  1.57*  --   --   TROPONINI  --   --  0.05* 6.03*  --  7.34*  --     Estimated Creatinine Clearance: 32.5 mL/min (by C-G formula based on Cr of 1.57).  Assessment: 82 yo male with VF arrest on 3/10 > ROSC. Now with elevated troponin. Pt is on Xarleto PTA for history of AFib (last dose was 3/10 @ 1800). Pt now started on heparin gtt. Heparin level being affected by Xarelto. Utilizing aPTT to monitor heparin.  APTT 88 sec (therapeutic) on gtt at 1000 units/hr   Goal of Therapy:  APTT 66-102 sec Heparin level 0.3-0.7 units/ml Monitor platelets by anticoagulation protocol: Yes   Plan:   -Continue Heparin 1000 units/hr -Daily HL, CBC, PTT  Sherlon Handing, PharmD, BCPS Clinical pharmacist, pager [phone] 11/14/2015,11:32 PM

## 2015-11-14 NOTE — Progress Notes (Signed)
Pharmacy Antibiotic Note  [name] [name] is a 82 y.o. male admitted on 11/10/2015 with GPC bacteremia.  Pharmacy has been consulted for Vancomycin dosing (Day #4). Pt also restarted on Rocephin (Day #1) for better strep coverage.  Vancomycin trough 12 mcg/ml (subtherapeutic for goal of 15-20 mcg/ml) on 1000mg  IV q24h  Plan: Change vancomycin to 1250mg  IV Q24 - give next dose tonight at 2400 Continuee ceftriaxone to 2g IV Q24  Follow up cx results to de-escalate abx   Height: 5\' 9"  (175.3 cm) Weight: 156 lb 4.9 oz (70.9 kg) IBW/kg (Calculated) : 70.7  Temp (24hrs), Avg:98.3 F (36.8 C), Min:96.8 F (36 C), Max:99.3 F (37.4 C)   Recent Labs Lab 11/10/15 1456 11/11/15 0530 11/11/15 1504 11/12/15 0412 11/12/15 1102 11/12/15 1526 11/13/15 0519 11/13/15 2316 11/13/15 2317 11/14/15 0306 11/14/15 0314 11/14/15 0602  WBC 10.8* 11.8*  --   --  19.3*  --  13.1*  --  21.0*  --   --   --   CREATININE 1.62* 1.37*  --   --   --   --  1.32*  --  1.47*  --  1.57*  --   LATICACIDVEN 2.6*  --  4.6* 3.3*  --  1.8  --  4.3*  --  1.9  --   --   VANCOTROUGH  --   --   --   --   --   --   --   --   --   --   --  12    Estimated Creatinine Clearance: 32.5 mL/min (by C-G formula based on Cr of 1.57).    Allergies  Allergen Reactions  . Amoxicillin Itching and Rash  . Penicillins Itching and Rash    Has patient had a PCN reaction causing immediate rash, facial/tongue/throat swelling, SOB or lightheadedness with hypotension: Yes Has patient had a PCN reaction causing severe rash involving mucus membranes or skin necrosis: No Has patient had a PCN reaction that required hospitalization No Has patient had a PCN reaction occurring within the last 10 years: No If all of the above answers are "NO", then may proceed with Cephalosporin use.     Antimicrobials this admission: 3/8 Vanc >>  3/7 Rocephin >> 3/8; restart 3/10 >> 3/7 Azith>>3/8  3/7 Tamiflu>> 3/12   Dose adjustments this  admission: 3/11 VT 12 mcg/ml (subtherapeutic for goal of 15-20 mcg/ml) on 1000mg  IV q24h - changed to 1250mg  IV q24h  Microbiology results: 3/7 BCx x 2: 2/2 GPC in chains 3/7 Urine: insign growth 3/7 Sputum: neg  3/10 MRSA PCR: neg  Thank you for allowing pharmacy to be a part of this patient's care.  Elenor Quinones, PharmD, BCPS Clinical Pharmacist Pager [phone] 11/14/2015 6:42 AM

## 2015-11-14 NOTE — Progress Notes (Signed)
Nutrition Follow-up / Consult   DOCUMENTATION CODES:   Severe malnutrition in context of acute illness/injury  INTERVENTION:    Initiate TF via OGT with Vital AF 1.2 at 25 ml/h and Prostat 30 ml BID on day 1; on day 2, increase to goal rate of 50 ml/h (1200 ml per day) to provide 1640 kcals, 120 gm protein, 973 ml free water daily.  NUTRITION DIAGNOSIS:   Malnutrition related to acute illness as evidenced by moderate depletions of muscle mass, severe depletion of muscle mass, energy intake < or equal to 50% for > or equal to 5 days.  Ongoing   GOAL:   Patient will meet greater than or equal to 90% of their needs  Unmet  MONITOR:   Vent status, TF tolerance, Labs, I & O's  REASON FOR ASSESSMENT:   Consult Enteral/tube feeding initiation and management  ASSESSMENT:   82 year old male with past medical history of CAD, diastolic CHF, atrial fibrillation, and COPD who presented to the emergency room on 3/7 after several days of generalized weakness and confusion plus fever and productive cough with brown sputum. Influenza A cultures positive as were blood cultures for gram-positive cocci in chains.   Patient coded yesterday evening and required intubation and transfer to the ICU. Received MD Consult for TF initiation and management. MV: 7.8 L/min Temp (24hrs), Avg:98.3 F (36.8 C), Min:96.8 F (36 C), Max:99.3 F (37.4 C)  Propofol: none  Diet Order:   NPO  Skin:  Wound (see comment) (stage 1 pressure ulcer to sacrum)  Last BM:  3/11  Height:   Ht Readings from Last 1 Encounters:  11/11/15 5\' 9"  (1.753 m)    Weight:   Wt Readings from Last 1 Encounters:  11/13/15 156 lb 4.9 oz (70.9 kg)    Ideal Body Weight:  72.7 kg  BMI:  Body mass index is 23.07 kg/(m^2).  Estimated Nutritional Needs:   Kcal:  1600  Protein:  100-120 gm  Fluid:  1.8 L  EDUCATION NEEDS:   No education needs identified at this time  Molli Barrows, [place], [place], [place] Pager  [phone] After Hours Pager [phone]

## 2015-11-14 NOTE — Progress Notes (Signed)
[facility] Progress Note Patient Name: [name] [name] DOB: 12-30-1925 MRN: [id number]   Date of Service  11/14/2015  HPI/Events of Note  Hypokalemia  eICU Interventions  Potassium replaced     Intervention Category Intermediate Interventions: Electrolyte abnormality - evaluation and management  [name] 11/14/2015, 4:34 AM

## 2015-11-14 NOTE — Progress Notes (Signed)
CRITICAL VALUE ALERT  Critical value received:  Trop 6.3  Date of notification:  11/14/15  Time of notification:  0412  Critical value read back:Yes.    Nurse who received alert:  km  Responding MD:  elink  Time MD responded:  [phone]

## 2015-11-14 NOTE — Progress Notes (Signed)
CRITICAL VALUE ALERT  Critical value received:  La 4.3  Date of notification:  11/14/15  Time of notification:  0000  Critical value read back:Yes.    Nurse who received alert:  bs  Responding MD:  Germain Osgood  Time MD responded:  0000

## 2015-11-14 NOTE — Progress Notes (Signed)
Pt in usual state of health until he was noted to be in V-fib, found unresponsive in room.  Code blue initiated and CPR started.  Pt's HCPOA notified and cannot leave husband who has had recent heart surgery.  Daughter informed of pt's condition and will be coming from [facility].  Pt shocked, intubated, see code blue chart for further details.  Pt transferred to 24M12 by team, beside report given to RN in 24M.

## 2015-11-14 NOTE — Progress Notes (Signed)
[facility] Progress Note Patient Name: [name] [name] DOB: 07/31/1926 MRN: [id number]   Date of Service  11/14/2015  HPI/Events of Note  Hyperglycemia  eICU Interventions  Placed on q4 hour SSI - moderate scale     Intervention Category Intermediate Interventions: Hyperglycemia - evaluation and treatment  [name] [name] 11/14/2015, 3:50 AM

## 2015-11-15 ENCOUNTER — Inpatient Hospital Stay (HOSPITAL_COMMUNITY): Payer: Medicare Other

## 2015-11-15 DIAGNOSIS — R7881 Bacteremia: Secondary | ICD-10-CM

## 2015-11-15 DIAGNOSIS — J9601 Acute respiratory failure with hypoxia: Secondary | ICD-10-CM

## 2015-11-15 DIAGNOSIS — I951 Orthostatic hypotension: Secondary | ICD-10-CM

## 2015-11-15 DIAGNOSIS — B954 Other streptococcus as the cause of diseases classified elsewhere: Secondary | ICD-10-CM

## 2015-11-15 LAB — CBC
HEMATOCRIT: 26.7 % — AB (ref 39.0–52.0)
Hemoglobin: 8.1 g/dL — ABNORMAL LOW (ref 13.0–17.0)
MCH: 27 pg (ref 26.0–34.0)
MCHC: 30.3 g/dL (ref 30.0–36.0)
MCV: 89 fL (ref 78.0–100.0)
PLATELETS: 60 10*3/uL — AB (ref 150–400)
RBC: 3 MIL/uL — ABNORMAL LOW (ref 4.22–5.81)
RDW: 18.5 % — AB (ref 11.5–15.5)
WBC: 15.4 10*3/uL — ABNORMAL HIGH (ref 4.0–10.5)

## 2015-11-15 LAB — GLUCOSE, CAPILLARY
GLUCOSE-CAPILLARY: 143 mg/dL — AB (ref 65–99)
GLUCOSE-CAPILLARY: 140 mg/dL — AB (ref 65–99)
GLUCOSE-CAPILLARY: 106 mg/dL — AB (ref 65–99)
Glucose-Capillary: 143 mg/dL — ABNORMAL HIGH (ref 65–99)
Glucose-Capillary: 131 mg/dL — ABNORMAL HIGH (ref 65–99)
Glucose-Capillary: 148 mg/dL — ABNORMAL HIGH (ref 65–99)

## 2015-11-15 LAB — BASIC METABOLIC PANEL
ANION GAP: 13 (ref 5–15)
ANION GAP: 12 (ref 5–15)
BUN: 34 mg/dL — AB (ref 6–20)
BUN: 38 mg/dL — ABNORMAL HIGH (ref 6–20)
CALCIUM: 8.5 mg/dL — AB (ref 8.9–10.3)
CALCIUM: 8.6 mg/dL — AB (ref 8.9–10.3)
CHLORIDE: 106 mmol/L (ref 101–111)
CO2: 26 mmol/L (ref 22–32)
CO2: 26 mmol/L (ref 22–32)
CREATININE: 1.77 mg/dL — AB (ref 0.61–1.24)
Chloride: 105 mmol/L (ref 101–111)
Creatinine, Ser: 1.73 mg/dL — ABNORMAL HIGH (ref 0.61–1.24)
GFR calc Af Amer: 39 mL/min — ABNORMAL LOW (ref >60)
GFR calc non Af Amer: 33 mL/min — ABNORMAL LOW (ref >60)
GFR, EST AFRICAN AMERICAN: 38 mL/min — AB (ref >60)
GFR, EST NON AFRICAN AMERICAN: 33 mL/min — AB (ref >60)
GLUCOSE: 138 mg/dL — AB (ref 65–99)
Glucose, Bld: 176 mg/dL — ABNORMAL HIGH (ref 65–99)
POTASSIUM: 3.3 mmol/L — AB (ref 3.5–5.1)
Potassium: 4.3 mmol/L (ref 3.5–5.1)
SODIUM: 144 mmol/L (ref 135–145)
Sodium: 144 mmol/L (ref 135–145)

## 2015-11-15 LAB — MAGNESIUM
MAGNESIUM: 2.1 mg/dL (ref 1.7–2.4)
Magnesium: 2.1 mg/dL (ref 1.7–2.4)

## 2015-11-15 LAB — APTT: APTT: 83 s — AB (ref 24–37)

## 2015-11-15 LAB — TROPONIN I: Troponin I: 3.82 ng/mL (ref <0.031)

## 2015-11-15 LAB — PHOSPHORUS
PHOSPHORUS: 1.9 mg/dL — AB (ref 2.5–4.6)
Phosphorus: 2.5 mg/dL (ref 2.5–4.6)

## 2015-11-15 LAB — HEPARIN LEVEL (UNFRACTIONATED): HEPARIN UNFRACTIONATED: 1.6 [IU]/mL — AB (ref 0.30–0.70)

## 2015-11-15 MED ORDER — POTASSIUM CHLORIDE 10 MEQ/100ML IV SOLN
10.0000 meq | INTRAVENOUS | Status: DC
  Administered 2015-11-15: 10 meq via INTRAVENOUS
  Filled 2015-11-15: qty 100

## 2015-11-15 MED ORDER — POTASSIUM CHLORIDE 20 MEQ/15ML (10%) PO SOLN
40.0000 meq | Freq: Once | ORAL | Status: AC
  Administered 2015-11-15: 40 meq via ORAL
  Filled 2015-11-15: qty 30

## 2015-11-15 NOTE — Progress Notes (Signed)
PULMONARY / CRITICAL CARE MEDICINE   Name: [name] [name] MRN: [id number] DOB: 01/24/1926    ADMISSION DATE:  11/10/2015 CONSULTATION DATE:  11/13/2015  REFERRING MD:  Lyman Speller  CHIEF COMPLAINT:  VF arrest, hypoxemic respiratory failure  HISTORY OF PRESENT ILLNESS:   37M admitted 11/10/2015 with acute hypoxemic respiratory failure secondary to acute influenza A. Subsequent workup also revealed positive blood cultures with GPCs in chains. He has been treated with vanc, rocephin and azithro as well as tamiflu. He was given IVF, but has a history of HFpEF and developed worsening respiratory distress. He has a significant cardiac history with prior A fib and is s/p pacemaker placement. He was noted to have non-sustained VT on the monitor earlier in the admission. 3/10 around 10:30pm he was noted to be in VF on the monitor and was found unresponsive and pulseless. Code Blue was called and per report (documentation not yet reviewed) he received CPR, 3 rounds of epi, was shocked twice and given an amp of HCO3. Pulses returned prior to amiodarone bolus being given. He was moved to 90M. His family was contacted; his HCPOA is unable to come to the hospital. His daughter is en route from [place].   [place] is significant for COPD, prior CABG, A fib and Mobitz II heart block s/p pacemaker placement, chronic leukocytosis, congestive heart failure, dementia. He moved in with his sister a few months ago and she is his 22.   Subjective:  pt is alert and following commands on vent ,  Wants ETT out  Weaning this am -good vol on PS 5/5    VITAL SIGNS: BP 122/65 mmHg  Pulse 60  Temp(Src) 97.7 F (36.5 C) (Core (Comment))  Resp 19  Ht 5\' 9"  (1.753 m)  Wt 157 lb 3 oz (71.3 kg)  BMI 23.20 kg/m2  SpO2 100%  HEMODYNAMICS:    VENTILATOR SETTINGS: Vent Mode:  [-] CPAP;PSV FiO2 (%):  [30 %-40 %] 30 % Set Rate:  [18 bmp] 18 bmp Vt Set:  [550 mL] 550 mL PEEP:  [5 cmH20] 5 cmH20 Pressure Support:  [5 cmH20] 5  cmH20 Plateau Pressure:  [14 cmH20-19 cmH20] 15 cmH20  INTAKE / OUTPUT: I/O last 3 completed shifts: In: 2893.9 [P.O.:240; I.V.:968.9; NG/GT:435; IV Piggyback:1250] Out: 1205 [Urine:1205]  PHYSICAL EXAMINATION: Physical Exam: Temp:  [97.7 F (36.5 C)-99.9 F (37.7 C)] 97.7 F (36.5 C) (03/12 0600) Pulse Rate:  [51-70] 60 (03/12 0600) Resp:  [15-23] 19 (03/12 0600) BP: (101-126)/(55-72) 122/65 mmHg (03/12 0600) SpO2:  [73 %-100 %] 100 % (03/12 0600) FiO2 (%):  [30 %-40 %] 30 % (03/12 0819) Weight:  [157 lb 3 oz (71.3 kg)] 157 lb 3 oz (71.3 kg) (03/12 0300)  General , thin elderly male on vent   HEENT No gross abnormalities.ETT in place.   Pulmonary . Coarse breath sounds bilaterally. No wheezes, rales or ronchi. Good effort. Symmetrical expansion.    Cardiovascular Regular rhythm. S1, s2. No m/r/g. Distal pulses palpable.  Abdomen Soft, non-tender, non-distended, positive bowel sounds, no palpable organomegaly or masses. Normoresonant to percussion.  Musculoskeletal No bony abnormalities  Lymphatics No cervical, supraclavicular or axillary adenopathy.   Neurologic Arouses to voice, opens eyes spontaneously. Moves all extremities. No focal deficits.  Followed simple commands   Skin/Integuement No rash, no cyanosis, no clubbing. Ecchymoses over bilateral upper extremities.    LABS:  BMET  Recent Labs Lab 11/13/15 2317 11/14/15 0314 11/15/15 0345  NA 144 145 144  K 3.4* 3.1* 3.3*  CL 106 108 105  CO2 24 26 26   BUN 31* 31* 34*  CREATININE 1.47* 1.57* 1.73*  GLUCOSE 188* 273* 138*    Electrolytes  Recent Labs Lab 11/13/15 2317 11/14/15 0314 11/15/15 0345  CALCIUM 8.2* 8.2* 8.5*  MG 2.4  --  2.1  PHOS  --   --  2.5    CBC  Recent Labs Lab 11/13/15 0519 11/13/15 2317 11/15/15 0345  WBC 13.1* 21.0* 15.4*  HGB 9.1* 9.7* 8.1*  HCT 29.5* 31.8* 26.7*  PLT 56* 80* 60*    Coag's  Recent Labs Lab 11/10/15 1456 11/14/15 2146 11/15/15 0345  APTT 32 88*  83*  INR 1.35  --   --     Sepsis Markers  Recent Labs Lab 11/10/15 1456  11/12/15 1526 11/13/15 2316 11/14/15 0306  LATICACIDVEN 2.6*  < > 1.8 4.3* 1.9  PROCALCITON 2.14  --   --   --   --   < > = values in this interval not displayed.  ABG  Recent Labs Lab 11/13/15 2350  PHART 7.378  PCO2ART 42.7  PO2ART 294*    Liver Enzymes  Recent Labs Lab 11/10/15 0910 11/10/15 1456  AST 24 35  ALT 11* 13*  ALKPHOS 60 63  BILITOT 1.1 1.4*  ALBUMIN 3.6 3.3*    Cardiac Enzymes  Recent Labs Lab 11/13/15 2317 11/14/15 0310 11/14/15 1051  TROPONINI 0.05* 6.03* 7.34*    Glucose  Recent Labs Lab 11/14/15 1251 11/14/15 1634 11/14/15 1953 11/15/15 0021 11/15/15 0402 11/15/15 0846  GLUCAP 143* 111* 131* 143* 143* 140*    Imaging Dg Chest Port 1 View  11/15/2015  CLINICAL DATA:  Respiratory failure EXAM: PORTABLE CHEST 1 VIEW COMPARISON:  11/14/2015 FINDINGS: Patchy opacities in the right upper lobe and right infrahilar regions, suspicious for pneumonia, possibly on the basis of aspiration. Asymmetric pulmonary edema is not excluded. This appearance is mildly improved in the upper lobe when compared to 11/13/2015, but is new from 11/10/2015. Endotracheal tube terminates 2 cm above the carina. Enteric tube courses below the diaphragm. Cardiomegaly. Postsurgical changes related to prior CABG. Left subclavian pacemaker. Old left rib fracture deformities.  Median sternotomy. IMPRESSION: Endotracheal tube terminates 2 cm above the carina. Multifocal patchy right lung opacities, mildly improved from 11/13/2015, favored to reflect aspiration pneumonia. Asymmetric pulmonary edema is not excluded. Electronically Signed   By: Julian Hy M.D.   On: 11/15/2015 07:24   Dg Chest Port 1v Same Day  11/14/2015  CLINICAL DATA:  Respiratory failure EXAM: PORTABLE CHEST 1 VIEW COMPARISON:  November 13, 2015 FINDINGS: The ETT terminates at the carina. Recommend withdrawing 3 cm. No  pneumothorax. The right-sided pulmonary opacity seen previously has improved but persists. This could represent asymmetric edema versus infectious process. Cardiomegaly again identified. No other acute abnormalities. IMPRESSION: 1. The ETT terminates at the carina.  Recommend withdrawing 3 cm. 2. The right-sided pulmonary opacity has improved but persist. Recommend follow-up to resolution. These results will be called to the ordering clinician or representative by the Radiologist Assistant, and communication documented in the PACS or zVision Dashboard. Electronically Signed   By: Dorise Bullion III M.D   On: 11/14/2015 11:12     STUDIES:  3/9 TTE  - EF 30-35%, Moderate diffuse hypokinesis. Regional wall  motion abnormalities cannot be excluded. - Ventricular septum: Septal motion showed moderate dyssynergy.  These changes are consistent with right ventricular pacing. - Mitral valve: Calcified annulus. There was moderate  regurgitation. Valve area by  pressure half-time: 1.71 cm^2. - Left atrium: The atrium was severely dilated. - Right ventricle: The cavity size was mildly dilated. Wall  thickness was normal. Systolic function was mildly reduced. - Right atrium: The atrium was moderately to severely dilated. - Pulmonary arteries: Systolic pressure was moderately increased.  PA peak pressure: 59 mm Hg (S).  CULTURES: Blood 3/7 >> GPC in chains, GPR, >ROTHIA species/Viridans Strep Urine 3/7 >> NGTD>NEG  Sputum 3/7 >> normal flora  ANTIBIOTICS: Tamiflu 3/7 >> Vancomycin 3/7 >> Ceftriaxone 3/7 >> 3/9; 3/10 >> Azithromycin 3/7>>3/9  SIGNIFICANT EVENTS: VF arrest 3/10 Card Consult 3/10   LINES/TUBES: ETT / OGT 3/10 Foley 3/10  DISCUSSION: Mr. Waltner is an 51M s/p VF arrest in the setting of sepsis secondary to Influenza A and bacteremia with GPC in chains, decompensated mixed CHF, underlying COPD, history of A fib on xarelto, prior CABG, history of mobitz II heart block, and  dementia.    ASSESSMENT / PLAN:  PULMONARY A: Acute hypoxemic respiratory failure  ?Asymmetrical Pulmonary Edema vs aspiration PNA  P:   Continue ventilatory support-weaning well x 2 day , consider extubation  Continue treatment of underlying infections See ID sxn   CARDIOVASCULAR A:  VF arrest  3/10 Non-sustained VT Decompensated systolic and diastolic CHF Hx A fib on Xarelto Hx CAD s/p CABG Hx Mobitz II heart block  Pacemaker Elevated Troponin  P:  Cont Amiodarone  Appreciate Cardiology consult  Continue on  Hep Drip -watch closely as hbg/plt tr down  Cont lasix   RENAL A:   Metabolic acidosis 2/2 arrest  CKD II-III (baseline Cr ~1.2) P:   Trend lytes, creatinine Avoid nephrotoxins if able  K+ repleted   GASTROINTESTINAL A:   No acute issues P:   TF   PPI for gi ppx while intubated  HEMATOLOGIC A:   Chronic leukocytosis - wbc tr down  Chronic thrombocytopenia - Plat tr down (on hep)  Anemia >Hbg tr down  P:  Trend CBC Transfuse per protocol   INFECTIOUS A:   Sepsis secondary to influenza A, bacteremia (ROTHIA/Viridans Strep) P:   Continue oseltamivir Continue vancomycin Cont ceftriaxone Follow cultures  ENDOCRINE A:   Hyperglycemia  P:   Cont SSI   NEUROLOGIC A:   Hx dementia S/p arrest 3/10 >hypothermia deferred given patient age, active infection, current neuro exam and hx of dementia  P:   RASS goal: 0, -1 Follow exam  Target 36 degrees - avoid fever  FAMILY  - Updates: no family present   - Inter-disciplinary family meet or Palliative Care meeting due by:  day 7  Tammy Parrett NP-C   Pulmonary and Critical Care  [phone]   11/15/2015, 9:38 AM

## 2015-11-15 NOTE — Progress Notes (Addendum)
SUBJECTIVE:  Currently intubated but awake and nods head to questions appropriately  OBJECTIVE:   Vitals:   Filed Vitals:   11/15/15 0408 11/15/15 0500 11/15/15 0600 11/15/15 1203  BP:  115/59 122/65 135/65  Pulse:  59 60   Temp:  99 F (37.2 C) 97.7 F (36.5 C)   TempSrc:   Core (Comment)   Resp:  17 19   Height:      Weight:      SpO2: 99% 99% 100%    I&O's:   Intake/Output Summary (Last 24 hours) at 11/15/15 1217 Last data filed at 11/15/15 [id number]  Gross per 24 hour  Intake 1387.2 ml  Output    590 ml  Net  797.2 ml   TELEMETRY: Reviewed telemetry pt in NSR:     PHYSICAL EXAM General: Well developed, well nourished, intubated but wide awake Head: Eyes PERRLA, No xanthomas.   Normal cephalic and atramatic  Lungs:   Clear bilaterally to auscultation anteriorly Heart:   HRRR S1 S2 Pulses are 2+ & equal. Abdomen: Bowel sounds are positive, abdomen soft and non-tender without masses Extremities:   No clubbing, cyanosis or edema.  DP +1 Neuro: Alert and oriented X 3. Psych:  Good affect, responds appropriately   LABS: Basic Metabolic Panel:  Recent Labs  11/13/15 2317 11/14/15 0314 11/15/15 0345  NA 144 145 144  K 3.4* 3.1* 3.3*  CL 106 108 105  CO2 24 26 26   GLUCOSE 188* 273* 138*  BUN 31* 31* 34*  CREATININE 1.47* 1.57* 1.73*  CALCIUM 8.2* 8.2* 8.5*  MG 2.4  --  2.1  PHOS  --   --  2.5   Liver Function Tests: No results for input(s): AST, ALT, ALKPHOS, BILITOT, PROT, ALBUMIN in the last 72 hours. No results for input(s): LIPASE, AMYLASE in the last 72 hours. CBC:  Recent Labs  11/13/15 2317 11/15/15 0345  WBC 21.0* 15.4*  HGB 9.7* 8.1*  HCT 31.8* 26.7*  MCV 89.3 89.0  PLT 80* 60*   Cardiac Enzymes:  Recent Labs  11/13/15 2317 11/14/15 0310 11/14/15 1051  TROPONINI 0.05* 6.03* 7.34*   BNP: Invalid input(s): POCBNP D-Dimer: No results for input(s): DDIMER in the last 72 hours. Hemoglobin A1C: No results for input(s): HGBA1C in  the last 72 hours. Fasting Lipid Panel: No results for input(s): CHOL, HDL, LDLCALC, TRIG, CHOLHDL, LDLDIRECT in the last 72 hours. Thyroid Function Tests: No results for input(s): TSH, T4TOTAL, T3FREE, THYROIDAB in the last 72 hours.  Invalid input(s): FREET3 Anemia Panel: No results for input(s): VITAMINB12, FOLATE, FERRITIN, TIBC, IRON, RETICCTPCT in the last 72 hours. Coag Panel:   Lab Results  Component Value Date   INR 1.35 11/10/2015   INR 4.1* 03/13/2014   INR 2.3 02/19/2014    RADIOLOGY: Dg Chest 2 View  11/10/2015  CLINICAL DATA:  Weakness and difficulty walking since 4 p.m. yesterday. Initial encounter. EXAM: CHEST  2 VIEW COMPARISON:  PA and lateral chest 12/24/2012. CT chest, abdomen and pelvis 10/24/2012. FINDINGS: There is cardiomegaly without edema. The chest is hyperexpanded. Minimal left basilar atelectasis is noted. No pneumothorax or pleural effusion is seen. No focal bony abnormality is identified. The patient is status post CABG with a pacing device in place. Remote left rib fractures are identified. IMPRESSION: Cardiomegaly without edema. Mild left basilar atelectasis. Emphysema. Electronically Signed   By: Inge Rise M.D.   On: 11/10/2015 08:40   Dg Chest Port 1 View  11/15/2015  CLINICAL DATA:  Respiratory failure EXAM: PORTABLE CHEST 1 VIEW COMPARISON:  11/14/2015 FINDINGS: Patchy opacities in the right upper lobe and right infrahilar regions, suspicious for pneumonia, possibly on the basis of aspiration. Asymmetric pulmonary edema is not excluded. This appearance is mildly improved in the upper lobe when compared to 11/13/2015, but is new from 11/10/2015. Endotracheal tube terminates 2 cm above the carina. Enteric tube courses below the diaphragm. Cardiomegaly. Postsurgical changes related to prior CABG. Left subclavian pacemaker. Old left rib fracture deformities.  Median sternotomy. IMPRESSION: Endotracheal tube terminates 2 cm above the carina. Multifocal  patchy right lung opacities, mildly improved from 11/13/2015, favored to reflect aspiration pneumonia. Asymmetric pulmonary edema is not excluded. Electronically Signed   By: Julian Hy M.D.   On: 11/15/2015 07:24   Dg Chest Port 1 View  11/14/2015  CLINICAL DATA:  Cardiac arrest.  Intubation. EXAM: PORTABLE CHEST 1 VIEW COMPARISON:  11/10/2015 FINDINGS: Endotracheal tube 5.7 cm from the carina. Enteric tube tip below the diaphragm not included in field of view. Patient is post median sternotomy. Left-sided pacemaker in place. Cardiomegaly is unchanged. Development of diffuse perihilar and suprahilar right lung opacities, new from prior. Left lung grossly clear. No evidence of pneumothorax. Old left rib fractures. IMPRESSION: 1. Endotracheal tube 5.7 cm from the carina.  Enteric tube in place. 2. Development of right perihilar and suprahilar opacities, may reflect asymmetric pulmonary edema, pneumonia or aspiration. Cardiomegaly is stable. Electronically Signed   By: Jeb Levering M.D.   On: 11/14/2015 00:27   Dg Chest Port 1 View  11/10/2015  CLINICAL DATA:  Shortness breath, cough for 2-3 days EXAM: PORTABLE CHEST 1 VIEW COMPARISON:  11/10/2015 FINDINGS: There is mild left basilar scarring. There is no focal consolidation. There is no pleural effusion or pneumothorax. There is stable cardiomegaly. There is prior CABG. There is a cardiac pacemaker present. The osseous structures are unremarkable. IMPRESSION: No active disease. Electronically Signed   By: Kathreen Devoid   On: 11/10/2015 13:43   Dg Chest Port 1v Same Day  11/14/2015  CLINICAL DATA:  Respiratory failure EXAM: PORTABLE CHEST 1 VIEW COMPARISON:  November 13, 2015 FINDINGS: The ETT terminates at the carina. Recommend withdrawing 3 cm. No pneumothorax. The right-sided pulmonary opacity seen previously has improved but persists. This could represent asymmetric edema versus infectious process. Cardiomegaly again identified. No other acute  abnormalities. IMPRESSION: 1. The ETT terminates at the carina.  Recommend withdrawing 3 cm. 2. The right-sided pulmonary opacity has improved but persist. Recommend follow-up to resolution. These results will be called to the ordering clinician or representative by the Radiologist Assistant, and communication documented in the PACS or zVision Dashboard. Electronically Signed   By: Dorise Bullion III M.D   On: 11/14/2015 11:12   Dg Abd Portable 1v  11/14/2015  CLINICAL DATA:  Orogastric tube placement. EXAM: PORTABLE ABDOMEN - 1 VIEW COMPARISON:  10/24/2012 FINDINGS: Tip and side port of the enteric tube below the diaphragm in the stomach, however looped and tip directed towards the fundus. Air-filled transverse colon. No definite small bowel dilatation. IMPRESSION: Enteric tube below the stomach, however tip directed towards the fundus. Nonobstructive bowel gas pattern. Electronically Signed   By: Jeb Levering M.D.   On: 11/14/2015 00:28      ASSESSMENT/PLAN:   82 y.o.white male with atrial fibrillation on coumadin, second degree AV block Mobitz II s/p [employer] DDD dual chamber PPM (basal rate 60 bpm), history of CAD s/p CABG, HTN, HLD, COPD, chronic diastolic CHF  and thrombocytopenia who presented with acute respiratory failure on 11/10/2015. Patient is admitted under internal medicine service with acute hypoxemic respiratory failure secondary to acute influenza A virus and was found to have GPC bacteremia and is being treated with Abx. Patient on admission was having ectopy and was found to have new low LVEF of 30-35% on 3/9 echo. On 3/10 at  2241 hours patient was found to have polymorphic VT on the monitor. He became unresponsive and pulseless per report. Patient received CPR, 3 rounds of epinephrine and was shocked x 2. He was also given a dose of bicarb. Amiodarone bolus and drip were started. ROSC was obtained after 10 minutes.   Active Problems:  Acute systolic heart failure (HCC)   Sepsis due to pneumonia ([place])  Acute encephalopathy  Influenza with pneumonia  Hypotension  Protein-calorie malnutrition, severe (HCC)  VF arrest - unclear etiology but suspect CAD related  - post resuscitation EKG does not demonstrate ST elevation, however suspect CAD as etiology.  Troponin now 7.34.  Continue to cycle until peak.    - aspirin, high dose statin,  IV heparin, continue amiodarone load, check daily EKGs to evaluate QT duration on IV Amio, check TSH/LFTs for baseline - Keep potassium >4, calcium >9, magnesium >2  Acute systolic cardiomyopathy (New onset cardiomyopathy) - echo  yesterday showed LVEF of 30-35%, new since 2010 Discussed at length cardiac catheterization with patient's daughter from [place] as he has dementia, AKI and signficant thrombocytopenia and has been on chronic Xarelto for PAF. I have discussed the case at length with Dr. Ellyn Hack who is on for Interventional Cardiology.  His troponin continues to increase but, in the setting of AKI, this may all be due to his cardiac arrest.  He has marked thrombocytopenia and has been on Xarelto in the past for PAF which complicates anticoagulation going forward if he needs DAPT. Currently platelet count is 60K.  He also has significant acute on CKD and with history of bypass grafts, there is much higher risk of worsening renal function in setting of larger contrast load for cath.  He also has been treated for GNR sepsis and still has low grade fever and elevated WBC.  Currently harm appears to be greater than the benefit of cath and this was discussed with the daughter who showed understanding. For now will continue to treat medically with IV Heparin gtt (watch platelet count closely), statin.  ASA on hold due to low platelets but if ok with CCM would add low dose ASA.   No BB at present due to bradycardia.  Continue to cycle troponin until it peaks.    Acute pulmonary edema - consider gentle diuresis with IV lasix, if  hypotensive, support with vasopressors (levophed)  Moderate Pulmonary hypertension - PASP 59 mmhg, treat HF  Bacteremia with unknown source - continue Abx therapy, follow ID recs   The patient is critically ill with multiple organ systems failure and requires high complexity decision making for assessment and support, frequent evaluation and titration of therapies, application of advanced monitoring technologies and extensive interpretation of multiple databases. Critical Care Time devoted to patient care services described in this note independent of APP time is 60 minutes with >50% of time spent in direct patient care.     Sueanne Margarita, MD  11/15/2015  12:17 PM

## 2015-11-15 NOTE — Progress Notes (Signed)
ANTICOAGULATION CONSULT NOTE - follow-up Consult  Pharmacy Consult for heparin Indication: chest pain/ACS  Allergies  Allergen Reactions  . Amoxicillin Itching and Rash  . Penicillins Itching and Rash    Has patient had a PCN reaction causing immediate rash, facial/tongue/throat swelling, SOB or lightheadedness with hypotension: Yes Has patient had a PCN reaction causing severe rash involving mucus membranes or skin necrosis: No Has patient had a PCN reaction that required hospitalization No Has patient had a PCN reaction occurring within the last 10 years: No If all of the above answers are "NO", then may proceed with Cephalosporin use.     Patient Measurements: Height: 5\' 9"  (175.3 cm) Weight: 157 lb 3 oz (71.3 kg) IBW/kg (Calculated) : 70.7 Heparin Dosing Weight: 71 kg  Vital Signs: Temp: 97.7 F (36.5 C) (03/12 0600) Temp Source: Core (Comment) (03/12 0600) BP: 135/65 mmHg (03/12 1203) Pulse Rate: 60 (03/12 0600)  Labs:  Recent Labs  11/13/15 0519 11/13/15 2317 11/14/15 0310 11/14/15 0314 11/14/15 1051 11/14/15 2146 11/15/15 0345  HGB 9.1* 9.7*  --   --   --   --  8.1*  HCT 29.5* 31.8*  --   --   --   --  26.7*  PLT 56* 80*  --   --   --   --  60*  APTT  --   --   --   --   --  88* 83*  HEPARINUNFRC  --   --   --   --   --  1.86* 1.60*  CREATININE 1.32* 1.47*  --  1.57*  --   --  1.73*  TROPONINI  --  0.05* 6.03*  --  7.34*  --   --     Estimated Creatinine Clearance: 29.5 mL/min (by C-G formula based on Cr of 1.73).  Assessment: 82 yo male with VF arrest on 3/10 > ROSC. Now with elevated troponin. Pt is on Xarleto PTA for history of AFib (last dose was 3/10 @ 1800). Pt now started on heparin gtt.  Utilizing aPTT to monitor heparin. APTT this morning is therapeutic on 1000 units/hr.  Goal of Therapy:  APTT 66-102 sec Heparin level 0.3-0.7 units/ml Monitor platelets by anticoagulation protocol: Yes   Plan:  -Continue Heparin 1000 units/hr -Daily HL,  CBC, PTT    Hughes Better, PharmD, BCPS Clinical Pharmacist Pager: [phone] 11/15/2015 12:21 PM

## 2015-11-15 NOTE — Progress Notes (Signed)
[facility] Progress Note Patient Name: [name] [name] DOB: 02/10/1926 MRN: [id number]   Date of Service  11/15/2015  HPI/Events of Note  Low potassium  eICU Interventions  replaced     Intervention Category Minor Interventions: Electrolytes abnormality - evaluation and management  Mauri Brooklyn, P 11/15/2015, 5:59 AM

## 2015-11-15 NOTE — Progress Notes (Signed)
RT and RN were unsure what to do about extubation order. Order was placed this AM by CCM, but then we were told not to extubate due to impending cath lab trip. I spoke with NP and told her what cardiology said, and she said that was fine. Patient has yet to go to the cath lab and is still intubated. I placed him back on full support around 1200, and will leave him there until the order in clarified. Patient still states he is having CP. RT to monitor.

## 2015-11-15 NOTE — Progress Notes (Signed)
CSW contacted Sister Inez Catalina to inform of bed offers. Sister has chosen bed at [facility]. CSW to inform facility of family's choice. Once approved and patient is medically stable for discharge, CSW will arrange transportation for the patient.   DC Summary to be sent via Hub[facility] once available. CSW will continue to follow and provide support to patient while in the hospital.   Lucius Conn, [place] Worker [facility] Ph: [phone]

## 2015-11-15 NOTE — Progress Notes (Signed)
Extubation not done due to patient heading to cath lab. MD aware

## 2015-11-16 ENCOUNTER — Inpatient Hospital Stay (HOSPITAL_COMMUNITY): Payer: Medicare Other

## 2015-11-16 DIAGNOSIS — Z789 Other specified health status: Secondary | ICD-10-CM

## 2015-11-16 DIAGNOSIS — Z978 Presence of other specified devices: Secondary | ICD-10-CM | POA: Insufficient documentation

## 2015-11-16 DIAGNOSIS — Z0189 Encounter for other specified special examinations: Secondary | ICD-10-CM

## 2015-11-16 DIAGNOSIS — I214 Non-ST elevation (NSTEMI) myocardial infarction: Secondary | ICD-10-CM

## 2015-11-16 DIAGNOSIS — J1100 Influenza due to unidentified influenza virus with unspecified type of pneumonia: Secondary | ICD-10-CM

## 2015-11-16 DIAGNOSIS — G9340 Encephalopathy, unspecified: Secondary | ICD-10-CM

## 2015-11-16 LAB — GLUCOSE, CAPILLARY
GLUCOSE-CAPILLARY: 122 mg/dL — AB (ref 65–99)
GLUCOSE-CAPILLARY: 138 mg/dL — AB (ref 65–99)
Glucose-Capillary: 167 mg/dL — ABNORMAL HIGH (ref 65–99)
Glucose-Capillary: 123 mg/dL — ABNORMAL HIGH (ref 65–99)
Glucose-Capillary: 147 mg/dL — ABNORMAL HIGH (ref 65–99)

## 2015-11-16 LAB — BASIC METABOLIC PANEL
ANION GAP: 9 (ref 5–15)
BUN: 44 mg/dL — ABNORMAL HIGH (ref 6–20)
CO2: 28 mmol/L (ref 22–32)
Calcium: 9.1 mg/dL (ref 8.9–10.3)
Chloride: 108 mmol/L (ref 101–111)
Creatinine, Ser: 1.8 mg/dL — ABNORMAL HIGH (ref 0.61–1.24)
GFR, EST AFRICAN AMERICAN: 37 mL/min — AB (ref >60)
GFR, EST NON AFRICAN AMERICAN: 32 mL/min — AB (ref >60)
GLUCOSE: 125 mg/dL — AB (ref 65–99)
POTASSIUM: 4.3 mmol/L (ref 3.5–5.1)
Sodium: 145 mmol/L (ref 135–145)

## 2015-11-16 LAB — POCT I-STAT 3, ART BLOOD GAS (G3+)
ACID-BASE EXCESS: 3 mmol/L — AB (ref 0.0–2.0)
BICARBONATE: 27.5 meq/L — AB (ref 20.0–24.0)
O2 SAT: 93 %
PH ART: 7.456 — AB (ref 7.350–7.450)
PO2 ART: 63 mmHg — AB (ref 80.0–100.0)
TCO2: 29 mmol/L (ref 0–100)
pCO2 arterial: 39.1 mmHg (ref 35.0–45.0)

## 2015-11-16 LAB — TROPONIN I: Troponin I: 3.04 ng/mL (ref <0.031)

## 2015-11-16 LAB — CBC
HEMATOCRIT: 30.9 % — AB (ref 39.0–52.0)
Hemoglobin: 9.2 g/dL — ABNORMAL LOW (ref 13.0–17.0)
MCH: 26.7 pg (ref 26.0–34.0)
MCHC: 29.8 g/dL — ABNORMAL LOW (ref 30.0–36.0)
MCV: 89.8 fL (ref 78.0–100.0)
PLATELETS: 83 10*3/uL — AB (ref 150–400)
RBC: 3.44 MIL/uL — AB (ref 4.22–5.81)
RDW: 19.4 % — ABNORMAL HIGH (ref 11.5–15.5)
WBC: 24.7 10*3/uL — AB (ref 4.0–10.5)

## 2015-11-16 LAB — HEPARIN LEVEL (UNFRACTIONATED)
HEPARIN UNFRACTIONATED: 0.52 [IU]/mL (ref 0.30–0.70)
Heparin Unfractionated: 0.38 IU/mL (ref 0.30–0.70)

## 2015-11-16 LAB — APTT
APTT: 60 s — AB (ref 24–37)
APTT: 68 s — AB (ref 24–37)

## 2015-11-16 LAB — BRAIN NATRIURETIC PEPTIDE: B Natriuretic Peptide: 406.2 pg/mL — ABNORMAL HIGH (ref 0.0–100.0)

## 2015-11-16 LAB — TSH: TSH: 0.645 u[IU]/mL (ref 0.350–4.500)

## 2015-11-16 MED ORDER — VANCOMYCIN HCL IN DEXTROSE 750-5 MG/150ML-% IV SOLN
750.0000 mg | INTRAVENOUS | Status: DC
  Administered 2015-11-16 – 2015-11-17 (×2): 750 mg via INTRAVENOUS
  Filled 2015-11-16 (×3): qty 150

## 2015-11-16 NOTE — Progress Notes (Signed)
PULMONARY / CRITICAL CARE MEDICINE   Name: TIODORO GULBRANSEN MRN: [id number] DOB: 10-Sep-1925    ADMISSION DATE:  11/10/2015 CONSULTATION DATE:  11/13/2015  REFERRING MD:  Lyman Speller  CHIEF COMPLAINT:  VF arrest, hypoxemic respiratory failure  HISTORY OF PRESENT ILLNESS:   48M admitted 11/10/2015 with acute hypoxemic respiratory failure secondary to acute influenza A. Subsequent workup also revealed positive blood cultures with GPCs in chains. He has been treated with vanc, rocephin and azithro as well as tamiflu. He was given IVF, but has a history of HFpEF and developed worsening respiratory distress. He has a significant cardiac history with prior A fib and is s/p pacemaker placement. He was noted to have non-sustained VT on the monitor earlier in the admission. 3/10 around 10:30pm he was noted to be in VF on the monitor and was found unresponsive and pulseless. Code Blue was called and per report (documentation not yet reviewed) he received CPR, 3 rounds of epi, was shocked twice and given an amp of HCO3. Pulses returned prior to amiodarone bolus being given. He was moved to 89M. His family was contacted; his HCPOA is unable to come to the hospital. His daughter is en route from [place].   Arlee is significant for COPD, prior CABG, A fib and Mobitz II heart block s/p pacemaker placement, chronic leukocytosis, congestive heart failure, dementia. He moved in with his sister a few months ago and she is his 17.   Subjective:  Alert in bed, weaning   VITAL SIGNS: BP 136/77 mmHg  Pulse 65  Temp(Src) 97.9 F (36.6 C) (Oral)  Resp 16  Ht 5\' 9"  (1.753 m)  Wt 71.7 kg (158 lb 1.1 oz)  BMI 23.33 kg/m2  SpO2 100%  HEMODYNAMICS:    VENTILATOR SETTINGS: Vent Mode:  [-] PSV;CPAP FiO2 (%):  [30 %] 30 % Set Rate:  [18 bmp-19 bmp] 18 bmp Vt Set:  [550 mL] 550 mL PEEP:  [5 cmH20] 5 cmH20 Pressure Support:  [5 cmH20] 5 cmH20 Plateau Pressure:  [13 cmH20-22 cmH20] 16 cmH20  INTAKE / OUTPUT: I/O last  3 completed shifts: In: 2771.1 [I.V.:1101.1; NG/GT:1220; IV Piggyback:450] Out: [id number]  PHYSICAL EXAMINATION: Physical Exam: Temp:  [97.9 F (36.6 C)-100 F (37.8 C)] 97.9 F (36.6 C) (03/13 0831) Pulse Rate:  [58-69] 65 (03/13 0600) Resp:  [16-25] 16 (03/13 0600) BP: (103-162)/(53-77) 136/77 mmHg (03/13 0600) SpO2:  [94 %-100 %] 100 % (03/13 0831) FiO2 (%):  [30 %] 30 % (03/13 0831) Weight:  [71.7 kg (158 lb 1.1 oz)] 71.7 kg (158 lb 1.1 oz) (03/13 0400)  General , thin elderly male on vent   HEENT No gross abnormalities.ETT in place. Upright in bed  Pulmonary Coarse bilateral mild   Cardiovascular Regular rhythm. S1, s2. No m/r/g. Distal pulses palpable.  Abdomen Soft, non-tender, non-distended, positive bowel sounds, no palpable organomegaly or masses.  Musculoskeletal No bony abnormalities  Lymphatics No cervical, supraclavicular or axillary adenopathy or pain   Neurologic rass 0, mild anxiety  Skin/Integuement No rash   LABS:  BMET  Recent Labs Lab 11/15/15 0345 11/15/15 1247 11/16/15 0930  NA 144 144 145  K 3.3* 4.3 4.3  CL 105 106 108  CO2 26 26 28   BUN 34* 38* 44*  CREATININE 1.73* 1.77* 1.80*  GLUCOSE 138* 176* 125*    Electrolytes  Recent Labs Lab 11/13/15 2317  11/15/15 0345 11/15/15 1247 11/16/15 0930  CALCIUM 8.2*  < > 8.5* 8.6* 9.1  MG 2.4  --  2.1 2.1  --   PHOS  --   --  2.5 1.9*  --   < > = values in this interval not displayed.  CBC  Recent Labs Lab 11/13/15 2317 11/15/15 0345 11/16/15 0930  WBC 21.0* 15.4* 24.7*  HGB 9.7* 8.1* 9.2*  HCT 31.8* 26.7* 30.9*  PLT 80* 60* 83*    Coag's  Recent Labs Lab 11/10/15 1456 11/14/15 2146 11/15/15 0345 11/16/15 0930  APTT 32 88* 83* 60*  INR 1.35  --   --   --     Sepsis Markers  Recent Labs Lab 11/10/15 1456  11/12/15 1526 11/13/15 2316 11/14/15 0306  LATICACIDVEN 2.6*  < > 1.8 4.3* 1.9  PROCALCITON 2.14  --   --   --   --   < > = values in this interval not  displayed.  ABG  Recent Labs Lab 11/13/15 2350  PHART 7.378  PCO2ART 42.7  PO2ART 294*    Liver Enzymes  Recent Labs Lab 11/10/15 0910 11/10/15 1456  AST 24 35  ALT 11* 13*  ALKPHOS 60 63  BILITOT 1.1 1.4*  ALBUMIN 3.6 3.3*    Cardiac Enzymes  Recent Labs Lab 11/14/15 0310 11/14/15 1051 11/15/15 1247  TROPONINI 6.03* 7.34* 3.82*    Glucose  Recent Labs Lab 11/15/15 0846 11/15/15 1245 11/15/15 1547 11/15/15 1953 11/16/15 0021 11/16/15 0344  GLUCAP 140* 167* 106* 148* 138* 123*    Imaging Dg Chest Port 1 View  11/16/2015  CLINICAL DATA:  Pneumonia. EXAM: PORTABLE CHEST 1 VIEW COMPARISON:  11/15/2015 and 11/14/2015 FINDINGS: Endotracheal tube and NG tube are in good position pacemaker in place. Chronic cardiomegaly. The pulmonary vascularity is normal. Improving hazy density at the right lung base could represent infiltrate or small right effusion. Lungs are hyperinflated consistent with COPD. IMPRESSION: Improving density at the right lung base, either small right effusion or faint right lower lobe infiltrate. Electronically Signed   By: Lorriane Shire M.D.   On: 11/16/2015 07:32     STUDIES:  3/9 TTE  - EF 30-35%, Moderate diffuse hypokinesis. Regional wall  motion abnormalities cannot be excluded.  mitral regurgitation - Left atrium: The atrium was severely dilated. - Right ventricle: The cavity size was mildly dilated. Wall  thickness was normal. Systolic function was mildly reduced. - Right atrium: The atrium was moderately to severely dilated. - Pulmonary arteries: Systolic pressure was moderately increased.  PA peak pressure: 59 mm Hg (S).  CULTURES: Blood 3/7 >> GPC in chains, GPR, >ROTHIA species/Viridans Strep Urine 3/7 >> NGTD>NEG  Sputum 3/7 >> normal flora  ANTIBIOTICS: Tamiflu 3/7 >>12th Vancomycin 3/7 >>> Ceftriaxone 3/7 >> 3/9; 3/10 >> Azithromycin 3/7>>3/9  SIGNIFICANT EVENTS: VF arrest 3/10 Card Consult 3/10    LINES/TUBES: ETT / OGT 3/10 Foley 3/10  DISCUSSION: Mr. Loflin is an 55M s/p VF arrest in the setting of sepsis secondary to Influenza A and bacteremia with GPC in chains, decompensated mixed CHF, underlying COPD, history of A fib on xarelto, prior CABG, history of mobitz II heart block, and dementia.    ASSESSMENT / PLAN:  PULMONARY A: Acute hypoxemic respiratory failure  ?Asymmetrical Pulmonary Edema vs aspiration PNA  P:   weaning cpap 5 ps 5, goal 1 hr, assess rsbi Keep neg as able pcxr improved overall  CARDIOVASCULAR A:  VF arrest  3/10 Non-sustained VT Decompensated systolic and diastolic CHF Hx A fib on Xarelto Hx CAD s/p CABG Hx Mobitz II heart block  Pacemaker Elevated Troponin  P:  Cont Amiodarone  Appreciate Cardiology consult  Continue on  Hep Drip -watch closely as hbg/plt tr down  Hold lasix today, follow crt  RENAL A:   Metabolic acidosis 2/2 arrest  CKD II-III (baseline Cr ~1.2) P:   Hold lasix, chem in am  kvo  GASTROINTESTINAL A:   No acute issues P:   TF  , hold if weaning well PPI for gi ppx while intubated  HEMATOLOGIC A:   hemoconcetration Chronic thrombocytopenia - Plat tr down (on hep)   P:  Trend CBC Transfuse per protocol  Follow cbc further, all cell lineages went up  INFECTIOUS A:   Sepsis secondary to influenza A, bacteremia (ROTHIA/Viridans Strep) P:   Continue vancomycin and get sens first Cont ceftriaxone ensure repeat BC neg consider TEE  ENDOCRINE A:   Hyperglycemia  P:   Cont SSI   NEUROLOGIC A:   Hx dementia S/p arrest 3/10 >hypothermia deferred given patient age, active infection, current neuro exam and hx of dementia  P:   RASS goal: 0, -1 Follow exam  Target 36 degrees - avoid fever  FAMILY  - Updates: i updated fam in room   - Inter-disciplinary family meet or Palliative Care meeting due by:  day 7  Ccm time 35 min   Lavon Paganini. Titus Mould, MD, [place] Pgr: [facility]

## 2015-11-16 NOTE — Progress Notes (Signed)
ANTICOAGULATION CONSULT NOTE - follow-up Consult  Pharmacy Consult for heparin Indication: chest pain/ACS  Allergies  Allergen Reactions  . Amoxicillin Itching and Rash  . Penicillins Itching and Rash    Has patient had a PCN reaction causing immediate rash, facial/tongue/throat swelling, SOB or lightheadedness with hypotension: Yes Has patient had a PCN reaction causing severe rash involving mucus membranes or skin necrosis: No Has patient had a PCN reaction that required hospitalization No Has patient had a PCN reaction occurring within the last 10 years: No If all of the above answers are "NO", then may proceed with Cephalosporin use.     Patient Measurements: Height: 5\' 9"  (175.3 cm) Weight: 158 lb 1.1 oz (71.7 kg) IBW/kg (Calculated) : 70.7 Heparin Dosing Weight: 71 kg  Vital Signs: Temp: 99.1 F (37.3 C) (03/13 2200) Temp Source: Core (Comment) (03/13 2200) BP: 116/56 mmHg (03/13 2200) Pulse Rate: 65 (03/13 2200)  Labs:  Recent Labs  11/13/15 2317  11/14/15 1051  11/15/15 0345 11/15/15 1247 11/16/15 0930 11/16/15 1435 11/16/15 2210  HGB 9.7*  --   --   --  8.1*  --  9.2*  --   --   HCT 31.8*  --   --   --  26.7*  --  30.9*  --   --   PLT 80*  --   --   --  60*  --  83*  --   --   APTT  --   --   --   < > 83*  --  60*  --  68*  HEPARINUNFRC  --   --   --   < > 1.60*  --  0.52  --  0.38  CREATININE 1.47*  < >  --   --  1.73* 1.77* 1.80*  --   --   TROPONINI 0.05*  < > 7.34*  --   --  3.82*  --  3.04*  --   < > = values in this interval not displayed.  Estimated Creatinine Clearance: 28.4 mL/min (by C-G formula based on Cr of 1.8).  Assessment: 82 yo male with VF arrest on 3/10 > ROSC. Now with elevated troponin. Pt is on Xarelto PTA for history of AFib (last dose was 3/10 @ 1800).  Now on heparin drip- aPTT and HL are now correlating- both are therapeutic this evening on 1150 units/hr. No bleeding noted.  Goal of Therapy:  APTT 66-102 sec Heparin level  0.3-0.7 units/ml Monitor platelets by anticoagulation protocol: Yes   Plan:  -Increase Heparin slightly to 1200 units/hr to ensure levels stay therapeutic -Daily HL and CBC. No need for further aPTT levels as they are now correlating with HL -Follow for s/s bleeding  Zaidee Rion D. Sheri Prows, PharmD, BCPS Clinical Pharmacist Pager: [phone] 11/16/2015 10:37 PM

## 2015-11-16 NOTE — Progress Notes (Signed)
UR Completed. Ladavia Lindenbaum, RN, BSN.  [phone] 

## 2015-11-16 NOTE — Progress Notes (Signed)
ANTICOAGULATION CONSULT NOTE - follow-up Consult  Pharmacy Consult for heparin Indication: chest pain/ACS  Allergies  Allergen Reactions  . Amoxicillin Itching and Rash  . Penicillins Itching and Rash    Has patient had a PCN reaction causing immediate rash, facial/tongue/throat swelling, SOB or lightheadedness with hypotension: Yes Has patient had a PCN reaction causing severe rash involving mucus membranes or skin necrosis: No Has patient had a PCN reaction that required hospitalization No Has patient had a PCN reaction occurring within the last 10 years: No If all of the above answers are "NO", then may proceed with Cephalosporin use.     Patient Measurements: Height: 5\' 9"  (175.3 cm) Weight: 158 lb 1.1 oz (71.7 kg) IBW/kg (Calculated) : 70.7 Heparin Dosing Weight: 71 kg  Vital Signs: Temp: 97.9 F (36.6 C) (03/13 0831) Temp Source: Oral (03/13 0831) BP: 136/77 mmHg (03/13 0600) Pulse Rate: 65 (03/13 0600)  Labs:  Recent Labs  11/13/15 2317 11/14/15 0310  11/14/15 1051 11/14/15 2146 11/15/15 0345 11/15/15 1247 11/16/15 0930  HGB 9.7*  --   --   --   --  8.1*  --  9.2*  HCT 31.8*  --   --   --   --  26.7*  --  30.9*  PLT 80*  --   --   --   --  60*  --  83*  APTT  --   --   --   --  88* 83*  --  60*  HEPARINUNFRC  --   --   --   --  1.86* 1.60*  --  0.52  CREATININE 1.47*  --   < >  --   --  1.73* 1.77* 1.80*  TROPONINI 0.05* 6.03*  --  7.34*  --   --  3.82*  --   < > = values in this interval not displayed.  Estimated Creatinine Clearance: 28.4 mL/min (by C-G formula based on Cr of 1.8).  Assessment: 82 yo male with VF arrest on 3/10 > ROSC. Now with elevated troponin. Pt is on Xarleto PTA for history of AFib (last dose was 3/10 @ 1800). Pt now started on heparin gtt.  Utilizing aPTT to monitor heparin.  APTT dropped to sub-therapeutic range this AM on 1000 units/hr.   Goal of Therapy:  APTT 66-102 sec Heparin level 0.3-0.7 units/ml Monitor platelets by  anticoagulation protocol: Yes   Plan:  -Increase Heparin 1150 units/hr -Recheck heparin level in 8 hours -Daily HL, CBC, PTT  Sloan Leiter, PharmD, BCPS Clinical Pharmacist [phone] 11/16/2015,11:26 AM

## 2015-11-16 NOTE — Progress Notes (Signed)
Patient Name: [name] [name] Date of Encounter: 11/16/2015   SUBJECTIVE  Intubated however awake and answer question by nodding head. No chest pain.   CURRENT MEDS . antiseptic oral rinse  7 mL Mouth Rinse QID  . arformoterol  15 mcg Nebulization BID  . budesonide (PULMICORT) nebulizer solution  0.25 mg Nebulization BID  . cefTRIAXone (ROCEPHIN)  IV  2 g Intravenous QHS  . chlorhexidine gluconate  15 mL Mouth Rinse BID  . donepezil  5 mg Oral QHS  . feeding supplement (PRO-STAT SUGAR FREE 64)  30 mL Per Tube BID  . insulin aspart  0-15 Units Subcutaneous 6 times per day  . omeprazole  40 mg Per Tube Daily  . simvastatin  10 mg Oral q1800  . tamsulosin  0.4 mg Oral Daily    OBJECTIVE  Filed Vitals:   11/16/15 0400 11/16/15 0500 11/16/15 0600 11/16/15 0831  BP: 122/61 126/62 136/77   Pulse: 58 60 65   Temp: 99.3 F (37.4 C) 99.1 F (37.3 C) 98.8 F (37.1 C) 97.9 F (36.6 C)  TempSrc: Core (Comment)  Core (Comment) Oral  Resp: 22 20 16    Height:      Weight: 158 lb 1.1 oz (71.7 kg)     SpO2: 100% 96% 100% 100%    Intake/Output Summary (Last 24 hours) at 11/16/15 1221 Last data filed at 11/16/15 0600  Gross per 24 hour  Intake 1335.6 ml  Output    860 ml  Net  475.6 ml   Filed Weights   11/13/15 2315 11/15/15 0300 11/16/15 0400  Weight: 156 lb 4.9 oz (70.9 kg) 157 lb 3 oz (71.3 kg) 158 lb 1.1 oz (71.7 kg)    PHYSICAL EXAM  General: Pleasant NAD. Intubated with awake Neuro: Alert and oriented X 3. Moves all extremities spontaneously. Psych: Normal affect. HEENT:  Normal  Neck: Supple without bruits or JVD. Lungs:  Resp regular and unlabored, CTA anteriorly.  Heart: RRR no s3, s4, or murmurs. Abdomen: Soft, non-tender, non-distended, BS + x 4.  Extremities: No clubbing, cyanosis or edema. DP/PT/Radials 2+ and equal bilaterally.  Accessory Clinical Findings  CBC  Recent Labs  11/15/15 0345 11/16/15 0930  WBC 15.4* 24.7*  HGB 8.1* 9.2*  HCT 26.7*  30.9*  MCV 89.0 89.8  PLT 60* 83*   Basic Metabolic Panel  Recent Labs  11/15/15 0345 11/15/15 1247 11/16/15 0930  NA 144 144 145  K 3.3* 4.3 4.3  CL 105 106 108  CO2 26 26 28   GLUCOSE 138* 176* 125*  BUN 34* 38* 44*  CREATININE 1.73* 1.77* 1.80*  CALCIUM 8.5* 8.6* 9.1  MG 2.1 2.1  --   PHOS 2.5 1.9*  --    Cardiac Enzymes  Recent Labs  11/14/15 0310 11/14/15 1051 11/15/15 1247  TROPONINI 6.03* 7.34* 3.82*   TELE  Intermittent paced rhythm   Radiology/Studies  Dg Chest 2 View  11/10/2015  CLINICAL DATA:  Weakness and difficulty walking since 4 p.m. yesterday. Initial encounter. EXAM: CHEST  2 VIEW COMPARISON:  PA and lateral chest 12/24/2012. CT chest, abdomen and pelvis 10/24/2012. FINDINGS: There is cardiomegaly without edema. The chest is hyperexpanded. Minimal left basilar atelectasis is noted. No pneumothorax or pleural effusion is seen. No focal bony abnormality is identified. The patient is status post CABG with a pacing device in place. Remote left rib fractures are identified. IMPRESSION: Cardiomegaly without edema. Mild left basilar atelectasis. Emphysema. Electronically Signed   By: Inge Rise  M.D.   On: 11/10/2015 08:40   Dg Chest Port 1 View  11/16/2015  CLINICAL DATA:  Pneumonia. EXAM: PORTABLE CHEST 1 VIEW COMPARISON:  11/15/2015 and 11/14/2015 FINDINGS: Endotracheal tube and NG tube are in good position pacemaker in place. Chronic cardiomegaly. The pulmonary vascularity is normal. Improving hazy density at the right lung base could represent infiltrate or small right effusion. Lungs are hyperinflated consistent with COPD. IMPRESSION: Improving density at the right lung base, either small right effusion or faint right lower lobe infiltrate. Electronically Signed   By: Lorriane Shire M.D.   On: 11/16/2015 07:32   Dg Chest Port 1 View  11/15/2015  CLINICAL DATA:  Respiratory failure EXAM: PORTABLE CHEST 1 VIEW COMPARISON:  11/14/2015 FINDINGS: Patchy  opacities in the right upper lobe and right infrahilar regions, suspicious for pneumonia, possibly on the basis of aspiration. Asymmetric pulmonary edema is not excluded. This appearance is mildly improved in the upper lobe when compared to 11/13/2015, but is new from 11/10/2015. Endotracheal tube terminates 2 cm above the carina. Enteric tube courses below the diaphragm. Cardiomegaly. Postsurgical changes related to prior CABG. Left subclavian pacemaker. Old left rib fracture deformities.  Median sternotomy. IMPRESSION: Endotracheal tube terminates 2 cm above the carina. Multifocal patchy right lung opacities, mildly improved from 11/13/2015, favored to reflect aspiration pneumonia. Asymmetric pulmonary edema is not excluded. Electronically Signed   By: Julian Hy M.D.   On: 11/15/2015 07:24   Dg Chest Port 1 View  11/14/2015  CLINICAL DATA:  Cardiac arrest.  Intubation. EXAM: PORTABLE CHEST 1 VIEW COMPARISON:  11/10/2015 FINDINGS: Endotracheal tube 5.7 cm from the carina. Enteric tube tip below the diaphragm not included in field of view. Patient is post median sternotomy. Left-sided pacemaker in place. Cardiomegaly is unchanged. Development of diffuse perihilar and suprahilar right lung opacities, new from prior. Left lung grossly clear. No evidence of pneumothorax. Old left rib fractures. IMPRESSION: 1. Endotracheal tube 5.7 cm from the carina.  Enteric tube in place. 2. Development of right perihilar and suprahilar opacities, may reflect asymmetric pulmonary edema, pneumonia or aspiration. Cardiomegaly is stable. Electronically Signed   By: Jeb Levering M.D.   On: 11/14/2015 00:27   Dg Chest Port 1 View  11/10/2015  CLINICAL DATA:  Shortness breath, cough for 2-3 days EXAM: PORTABLE CHEST 1 VIEW COMPARISON:  11/10/2015 FINDINGS: There is mild left basilar scarring. There is no focal consolidation. There is no pleural effusion or pneumothorax. There is stable cardiomegaly. There is prior CABG.  There is a cardiac pacemaker present. The osseous structures are unremarkable. IMPRESSION: No active disease. Electronically Signed   By: Kathreen Devoid   On: 11/10/2015 13:43   Dg Chest Port 1v Same Day  11/14/2015  CLINICAL DATA:  Respiratory failure EXAM: PORTABLE CHEST 1 VIEW COMPARISON:  November 13, 2015 FINDINGS: The ETT terminates at the carina. Recommend withdrawing 3 cm. No pneumothorax. The right-sided pulmonary opacity seen previously has improved but persists. This could represent asymmetric edema versus infectious process. Cardiomegaly again identified. No other acute abnormalities. IMPRESSION: 1. The ETT terminates at the carina.  Recommend withdrawing 3 cm. 2. The right-sided pulmonary opacity has improved but persist. Recommend follow-up to resolution. These results will be called to the ordering clinician or representative by the Radiologist Assistant, and communication documented in the PACS or zVision Dashboard. Electronically Signed   By: Dorise Bullion III M.D   On: 11/14/2015 11:12   Dg Abd Portable 1v  11/14/2015  CLINICAL DATA:  Orogastric  tube placement. EXAM: PORTABLE ABDOMEN - 1 VIEW COMPARISON:  10/24/2012 FINDINGS: Tip and side port of the enteric tube below the diaphragm in the stomach, however looped and tip directed towards the fundus. Air-filled transverse colon. No definite small bowel dilatation. IMPRESSION: Enteric tube below the stomach, however tip directed towards the fundus. Nonobstructive bowel gas pattern. Electronically Signed   By: Jeb Levering M.D.   On: 11/14/2015 00:28   ASSESSMENT AND PLAN 82 y.o.white male with atrial fibrillation on coumadin, second degree AV block Mobitz II s/p [employer] DDD dual chamber PPM (basal rate 60 bpm), history of CAD s/p CABG, HTN, HLD, COPD, chronic diastolic CHF and thrombocytopenia who presented with acute respiratory failure on 11/10/2015. Patient is admitted under internal medicine service with acute hypoxemic respiratory  failure secondary to acute influenza A virus and was found to have GPC bacteremia and is being treated with Abx. Patient on admission was having ectopy and was found to have new low LVEF of 30-35% on 3/9 echo. On 3/10 at 2241 hours patient was found to have polymorphic VT on the monitor. He became unresponsive and pulseless per report. Patient received CPR, 3 rounds of epinephrine and was shocked x 2. He was also given a dose of bicarb. Amiodarone bolus and drip were started. ROSC was obtained after 10 minutes.   VF arrest - unclear etiology but suspected CAD related  - post resuscitation EKG does not demonstrate ST elevation, however suspect CAD as etiology. Troponin 6.03-->7.34-->3.82. Trended down. Will get one more Troponin level to see clear trend. - On simvastatin 10mg  (Dr. Radford Pax mention high dose statin) IV heparin, continue amiodarone load,  - Pending TSH and LFTs - Keep potassium >4, calcium >9, magnesium >2 - QTc of 557ms yesterday. Will get EKG today  Acute systolic cardiomyopathy (New onset cardiomyopathy) - echoshowed LVEF of 30-35%, new since 2010 - See Dr. Theodosia Blender note for detail - Cath on hold due to risk higher than benefit - SA on hold due to low platelets but if ok with CCM would add low dose ASA. Platelets improved.  -  No BB at present due to bradycardia.  Acute pulmonary edema - BNP of 406.2. IV lasix was held by [facility] due to increasing Scr.(levophed)  Moderate Pulmonary hypertension - PASP 59 mmhg, treat HF  Signed, Bhagat,Bhavinkumar PA-C Pager [phone]  The patient was seen, examined and discussed with Bhagat,Bhavinkumar PA-C and I agree with the above.   82 year old male post cardiac arrest, NSTEMI, prior h/o CAD, CABG, a-fib, PM, chronic diastolic CHF, now intubated ith acute hypoxemic respiratory failure secondary to acute influenza A virus. Max troponin 7 --> 3. New LV dysfunction 30-35%. Crea 1.8. We will follow, once clinically improved we will  consider cath considering improvement of Crea.  He is not significantly fluid overloaded on physical exam, agree with holding diuretics in the settings of worsening crea.  Dorothy Spark 11/16/2015

## 2015-11-16 NOTE — Progress Notes (Signed)
Pharmacy Antibiotic Note  [name] [name] is a 82 y.o. male admitted on 11/10/2015 with bacteremia.  Pharmacy has been consulted for vancomycin dosing.  Plan: Vancomycin 750 mg IV every 24 hours.  Goal trough 15-20 mcg/mL.  Monitor culture susceptibilities to help narrow Monitor renal function and adjust therapy as needed  Height: 5\' 9"  (175.3 cm) Weight: 158 lb 1.1 oz (71.7 kg) IBW/kg (Calculated) : 70.7  Temp (24hrs), Avg:99.4 F (37.4 C), Min:97.9 F (36.6 C), Max:100 F (37.8 C)   Recent Labs Lab 11/11/15 1504 11/12/15 0412 11/12/15 1102 11/12/15 1526 11/13/15 0519 11/13/15 2316 11/13/15 2317 11/14/15 0306 11/14/15 0314 11/14/15 0602 11/15/15 0345 11/15/15 1247 11/16/15 0930  WBC  --   --  19.3*  --  13.1*  --  21.0*  --   --   --  15.4*  --  24.7*  CREATININE  --   --   --   --  1.32*  --  1.47*  --  1.57*  --  1.73* 1.77* 1.80*  LATICACIDVEN 4.6* 3.3*  --  1.8  --  4.3*  --  1.9  --   --   --   --   --   VANCOTROUGH  --   --   --   --   --   --   --   --   --  12  --   --   --     Estimated Creatinine Clearance: 28.4 mL/min (by C-G formula based on Cr of 1.8).    Allergies  Allergen Reactions  . Amoxicillin Itching and Rash  . Penicillins Itching and Rash    Has patient had a PCN reaction causing immediate rash, facial/tongue/throat swelling, SOB or lightheadedness with hypotension: Yes Has patient had a PCN reaction causing severe rash involving mucus membranes or skin necrosis: No Has patient had a PCN reaction that required hospitalization No Has patient had a PCN reaction occurring within the last 10 years: No If all of the above answers are "NO", then may proceed with Cephalosporin use.     Antimicrobials this admission: 3/8 Vanc >> 3/12; Restart 3/13 >> 3/7 Rocephin >> 3/8; restart 3/10 >> 3/7 Azith>>3/8  3/7 Tamiflu>> 3/12  Dose adjustments this admission:  3/11 VT 12 mcg on 1 g q24h -> inc to 1250mg  q24h > stopped   Microbiology results: 3/7  BCx x 2: (??)Rothia, Strep viridans - no susceptibilities 3/7 Urine: insign growth 3/7 Sputum: neg  3/10 MRSA PCR: neg Flu+  Thank you for allowing pharmacy to be a part of this patient's care.  Sloan Leiter, PharmD, BCPS Clinical Pharmacist [phone]  11/16/2015 3:43 PM

## 2015-11-17 ENCOUNTER — Inpatient Hospital Stay (HOSPITAL_COMMUNITY): Payer: Medicare Other

## 2015-11-17 LAB — COMPREHENSIVE METABOLIC PANEL
ALK PHOS: 73 U/L (ref 38–126)
ALT: 30 U/L (ref 17–63)
AST: 27 U/L (ref 15–41)
Albumin: 2.7 g/dL — ABNORMAL LOW (ref 3.5–5.0)
Anion gap: 9 (ref 5–15)
BUN: 52 mg/dL — ABNORMAL HIGH (ref 6–20)
CALCIUM: 8.9 mg/dL (ref 8.9–10.3)
CO2: 29 mmol/L (ref 22–32)
CREATININE: 1.71 mg/dL — AB (ref 0.61–1.24)
Chloride: 108 mmol/L (ref 101–111)
GFR, EST AFRICAN AMERICAN: 39 mL/min — AB (ref >60)
GFR, EST NON AFRICAN AMERICAN: 34 mL/min — AB (ref >60)
Glucose, Bld: 135 mg/dL — ABNORMAL HIGH (ref 65–99)
Potassium: 4.2 mmol/L (ref 3.5–5.1)
Sodium: 146 mmol/L — ABNORMAL HIGH (ref 135–145)
TOTAL PROTEIN: 5.6 g/dL — AB (ref 6.5–8.1)
Total Bilirubin: 0.6 mg/dL (ref 0.3–1.2)

## 2015-11-17 LAB — CBC
HCT: 28.8 % — ABNORMAL LOW (ref 39.0–52.0)
HEMOGLOBIN: 8.6 g/dL — AB (ref 13.0–17.0)
MCH: 27 pg (ref 26.0–34.0)
MCHC: 29.9 g/dL — AB (ref 30.0–36.0)
MCV: 90.3 fL (ref 78.0–100.0)
PLATELETS: 77 10*3/uL — AB (ref 150–400)
RBC: 3.19 MIL/uL — AB (ref 4.22–5.81)
RDW: 19.5 % — ABNORMAL HIGH (ref 11.5–15.5)
WBC: 21.1 10*3/uL — ABNORMAL HIGH (ref 4.0–10.5)

## 2015-11-17 LAB — GLUCOSE, CAPILLARY
GLUCOSE-CAPILLARY: 123 mg/dL — AB (ref 65–99)
GLUCOSE-CAPILLARY: 132 mg/dL — AB (ref 65–99)
GLUCOSE-CAPILLARY: 138 mg/dL — AB (ref 65–99)
GLUCOSE-CAPILLARY: 146 mg/dL — AB (ref 65–99)
Glucose-Capillary: 103 mg/dL — ABNORMAL HIGH (ref 65–99)
Glucose-Capillary: 86 mg/dL (ref 65–99)

## 2015-11-17 LAB — HEPARIN LEVEL (UNFRACTIONATED): Heparin Unfractionated: 0.33 IU/mL (ref 0.30–0.70)

## 2015-11-17 LAB — APTT: aPTT: 75 seconds — ABNORMAL HIGH (ref 24–37)

## 2015-11-17 MED ORDER — FUROSEMIDE 10 MG/ML IJ SOLN
60.0000 mg | Freq: Two times a day (BID) | INTRAMUSCULAR | Status: DC
  Administered 2015-11-17 – 2015-11-18 (×2): 60 mg via INTRAVENOUS
  Filled 2015-11-17 (×3): qty 6

## 2015-11-17 MED FILL — Perflutren Lipid Microsphere IV Susp 1.1 MG/ML: INTRAVENOUS | Qty: 10 | Status: AC

## 2015-11-17 MED FILL — Medication: Qty: 1 | Status: AC

## 2015-11-17 NOTE — Progress Notes (Signed)
Attempted to call Pts sister [name] for telephonic consent for TEE tomorrow.

## 2015-11-17 NOTE — Progress Notes (Signed)
ANTICOAGULATION CONSULT NOTE - follow-up Consult  Pharmacy Consult for heparin Indication: chest pain/ACS  Allergies  Allergen Reactions  . Amoxicillin Itching and Rash  . Penicillins Itching and Rash    Has patient had a PCN reaction causing immediate rash, facial/tongue/throat swelling, SOB or lightheadedness with hypotension: Yes Has patient had a PCN reaction causing severe rash involving mucus membranes or skin necrosis: No Has patient had a PCN reaction that required hospitalization No Has patient had a PCN reaction occurring within the last 10 years: No If all of the above answers are "NO", then may proceed with Cephalosporin use.     Patient Measurements: Height: 5\' 9"  (175.3 cm) Weight: 159 lb 13.3 oz (72.5 kg) IBW/kg (Calculated) : 70.7 Heparin Dosing Weight: 71 kg  Vital Signs: Temp: 97.6 F (36.4 C) (03/14 1154) Temp Source: Oral (03/14 1154) BP: 126/58 mmHg (03/14 1000) Pulse Rate: 58 (03/14 1000)  Labs:  Recent Labs  11/15/15 0345 11/15/15 1247 11/16/15 0930 11/16/15 1435 11/16/15 2210 11/17/15 0219 11/17/15 0825  HGB 8.1*  --  9.2*  --   --  8.6*  --   HCT 26.7*  --  30.9*  --   --  28.8*  --   PLT 60*  --  83*  --   --  77*  --   APTT 83*  --  60*  --  68*  --  75*  HEPARINUNFRC 1.60*  --  0.52  --  0.38 0.33  --   CREATININE 1.73* 1.77* 1.80*  --   --  1.71*  --   TROPONINI  --  3.82*  --  3.04*  --   --   --     Estimated Creatinine Clearance: 29.9 mL/min (by C-G formula based on Cr of 1.71).  Assessment: 82 yo male with VF arrest on 3/10 > ROSC. Now with elevated troponin. Pt is on Xarelto PTA for history of AFib (last dose was 3/10 @ 1800).  Now on heparin drip- aPTT and HL are now correlating- both are therapeutic. No bleeding reported.  Goal of Therapy:  APTT 66-102 sec Heparin level 0.3-0.7 units/ml Monitor platelets by anticoagulation protocol: Yes   Plan:  -Continue Heparin at 1200 units/hr  -Daily HL and CBC.  -Follow for s/s  bleeding  Lauren D. Bajbus, PharmD, BCPS Clinical Pharmacist Pager: [phone] 11/17/2015 12:42 PM

## 2015-11-17 NOTE — Progress Notes (Signed)
PULMONARY / CRITICAL CARE MEDICINE   Name: [name] [name] MRN: [place]:[id number] DOB: 10-20-1925    ADMISSION DATE:  11/10/2015 CONSULTATION DATE:  11/13/2015  REFERRING MD:  Lyman Speller  CHIEF COMPLAINT:  VF arrest, hypoxemic respiratory failure  HISTORY OF PRESENT ILLNESS:   82M admitted 11/10/2015 with acute hypoxemic respiratory failure secondary to acute influenza A. Subsequent workup also revealed positive blood cultures with GPCs in chains. He has been treated with vanc, rocephin and azithro as well as tamiflu. He was given IVF, but has a history of HFpEF and developed worsening respiratory distress. He has a significant cardiac history with prior A fib and is s/p pacemaker placement. He was noted to have non-sustained VT on the monitor earlier in the admission. 3/10 around 10:30pm he was noted to be in VF on the monitor and was found unresponsive and pulseless. Code Blue was called and per report (documentation not yet reviewed) he received CPR, 3 rounds of epi, was shocked twice and given an amp of HCO3. Pulses returned prior to amiodarone bolus being given. He was moved to 60M. His family was contacted; his HCPOA is unable to come to the hospital. His daughter is en route from [place].   [place] is significant for COPD, prior CABG, A fib and Mobitz II heart block s/p pacemaker placement, chronic leukocytosis, congestive heart failure, dementia. He moved in with his sister a few months ago and she is his 82.   Subjective:  On vent Increase pcxr edema Pos 1.2 liters  VITAL SIGNS: BP 124/58 mmHg  Pulse 60  Temp(Src) 97.6 F (36.4 C) (Oral)  Resp 19  Ht 5\' 9"  (1.753 m)  Wt 72.5 kg (159 lb 13.3 oz)  BMI 23.59 kg/m2  SpO2 99%  HEMODYNAMICS:    VENTILATOR SETTINGS: Vent Mode:  [-] PRVC FiO2 (%):  [30 %] 30 % Set Rate:  [18 bmp] 18 bmp Vt Set:  [550 mL] 550 mL PEEP:  [5 cmH20] 5 cmH20 Plateau Pressure:  [11 cmH20-29 cmH20] 16 cmH20  INTAKE / OUTPUT: I/O last 3 completed shifts: In:  3115.2 [I.V.:1020.2; NG/GT:1845; IV Piggyback:250] Out: [id number]  PHYSICAL EXAMINATION: Physical Exam: Temp:  [97.6 F (36.4 C)-99.7 F (37.6 C)] 97.6 F (36.4 C) (03/14 1154) Pulse Rate:  [55-77] 60 (03/14 1313) Resp:  [16-27] 19 (03/14 1313) BP: (108-141)/(49-85) 124/58 mmHg (03/14 1313) SpO2:  [88 %-100 %] 99 % (03/14 1314) FiO2 (%):  [30 %] 30 % (03/14 1314) Weight:  [72.5 kg (159 lb 13.3 oz)] 72.5 kg (159 lb 13.3 oz) (03/14 0400)  General , thin elderly male on vent   HEENT No gross abnormalities.ETT in place  Pulmonary Coarse bilateral  Cardiovascular Regular rhythm. S1, s2. No m/r/g. Distal pulses palpable.  Abdomen Soft, non-tender, non-distended, positive bowel sounds wnl  Musculoskeletal No bony abnormalities  Lymphatics No cervical, supraclavicular or axillary adenopathy or pain   Neurologic rass 0  Skin/Integuement No rash   LABS:  BMET  Recent Labs Lab 11/15/15 1247 11/16/15 0930 11/17/15 0219  NA 144 145 146*  K 4.3 4.3 4.2  CL 106 108 108  CO2 26 28 29   BUN 38* 44* 52*  CREATININE 1.77* 1.80* 1.71*  GLUCOSE 176* 125* 135*    Electrolytes  Recent Labs Lab 11/13/15 2317  11/15/15 0345 11/15/15 1247 11/16/15 0930 11/17/15 0219  CALCIUM 8.2*  < > 8.5* 8.6* 9.1 8.9  MG 2.4  --  2.1 2.1  --   --   PHOS  --   --  2.5 1.9*  --   --   < > = values in this interval not displayed.  CBC  Recent Labs Lab 11/15/15 0345 11/16/15 0930 11/17/15 0219  WBC 15.4* 24.7* 21.1*  HGB 8.1* 9.2* 8.6*  HCT 26.7* 30.9* 28.8*  PLT 60* 83* 77*    Coag's  Recent Labs Lab 11/10/15 1456  11/16/15 0930 11/16/15 2210 11/17/15 0825  APTT 32  < > 60* 68* 75*  INR 1.35  --   --   --   --   < > = values in this interval not displayed.  Sepsis Markers  Recent Labs Lab 11/10/15 1456  11/12/15 1526 11/13/15 2316 11/14/15 0306  LATICACIDVEN 2.6*  < > 1.8 4.3* 1.9  PROCALCITON 2.14  --   --   --   --   < > = values in this interval not  displayed.  ABG  Recent Labs Lab 11/13/15 2350 11/16/15 1145  PHART 7.378 7.456*  PCO2ART 42.7 39.1  PO2ART 294* 63.0*    Liver Enzymes  Recent Labs Lab 11/10/15 1456 11/17/15 0219  AST 35 27  ALT 13* 30  ALKPHOS 63 73  BILITOT 1.4* 0.6  ALBUMIN 3.3* 2.7*    Cardiac Enzymes  Recent Labs Lab 11/14/15 1051 11/15/15 1247 11/16/15 1435  TROPONINI 7.34* 3.82* 3.04*    Glucose  Recent Labs Lab 11/16/15 1556 11/16/15 2022 11/16/15 2346 11/17/15 0333 11/17/15 0807 11/17/15 1151  GLUCAP 147* 122* 146* 138* 103* 86    Imaging Dg Chest Port 1 View  11/17/2015  CLINICAL DATA:  82 year old with influenza. Ventilator dependent respiratory failure. Followup asymmetric pulmonary edema versus pneumonia. EXAM: PORTABLE CHEST 1 VIEW COMPARISON:  11/16/2015 and earlier. FINDINGS: Endotracheal tube tip in satisfactory position projecting approximately 3 cm above the carina. Nasogastric tube courses below the diaphragm into the stomach. Cardiac silhouette markedly enlarged. Left subclavian dual lead transvenous pacemaker unchanged. Interval worsening of asymmetric interstitial and airspace opacities in the right lung with new mild interstitial opacities in the left lung. Associated small bilateral pleural effusions. IMPRESSION: 1. Support apparatus satisfactory. 2. New with interstitial edema in the left lung with progressive interstitial airspace opacities throughout the right lung, likely a combination of pulmonary edema and pneumonia. 3. Small bilateral pleural effusions. Electronically Signed   By: Evangeline Dakin M.D.   On: 11/17/2015 07:53     STUDIES:  3/9 TTE  - EF 30-35%, Moderate diffuse hypokinesis. Regional wall  motion abnormalities cannot be excluded.  mitral regurgitation - Left atrium: The atrium was severely dilated. - Right ventricle: The cavity size was mildly dilated. Wall  thickness was normal. Systolic function was mildly reduced. - Right atrium: The  atrium was moderately to severely dilated. - Pulmonary arteries: Systolic pressure was moderately increased.  PA peak pressure: 59 mm Hg (S).  CULTURES: Blood 3/7 >> GPC in chains, GPR, >ROTHIA species/Viridans Strep Urine 3/7 >> NGTD>NEG  Sputum 3/7 >> normal flora  ANTIBIOTICS: Tamiflu 3/7 >>12th Vancomycin 3/7 >>> Ceftriaxone 3/7 >> 3/9; 3/10 >> Azithromycin 3/7>>3/9  SIGNIFICANT EVENTS: VF arrest 3/10 Card Consult 3/10   LINES/TUBES: ETT / OGT 3/10 Foley 3/10  DISCUSSION: Mr. Jaskolski is an 60M s/p VF arrest in the setting of sepsis secondary to Influenza A and bacteremia with GPC in chains, decompensated mixed CHF, underlying COPD, history of A fib on xarelto, prior CABG, history of mobitz II heart block, and dementia.    ASSESSMENT / PLAN:  PULMONARY A: Acute hypoxemic respiratory failure  ?Asymmetrical  Pulmonary Edema vs aspiration PNA  P:   weaning cpap 5 ps 5, goal 1 hr, assess rsbi Keep ett for tee pcxr increase edema, consider neg balance Lasix  CARDIOVASCULAR A:  VF arrest  3/10 Non-sustained VT Decompensated systolic and diastolic CHF Hx A fib on Xarelto (held) Hx CAD s/p CABG Hx Mobitz II heart block  Pacemaker Elevated Troponin  R/o endocarditis P:  Cont Amiodarone  Appreciate Cardiology consult - for tee Continue on  Hep Drip Lasix restart  RENAL A:   Metabolic acidosis 2/2 arrest  CKD II-III (baseline Cr ~1.2) P:   Pos balance, edema pcxr - lasix Chem in am  kvo  GASTROINTESTINAL A:   No acute issues P:   TF  , hold for tee PPI for gi ppx while intubated  HEMATOLOGIC A:   hemoconcetration Chronic thrombocytopenia - Plat tr down (on hep)   P:  Trend CBC Transfuse per protocol  heparin  INFECTIOUS A:   Sepsis secondary to influenza A, bacteremia (ROTHIA/Viridans Strep) P:   Continue vancomycin  Cont ceftriaxone ensure repeat BC neg consider TEE for today  ENDOCRINE A:   Hyperglycemia  P:   Cont SSI    NEUROLOGIC A:   Hx dementia S/p arrest 3/10 >hypothermia deferred given patient age, active infection, current neuro exam and hx of dementia  P:   RASS goal: 0, -1 Follow exam  Pt on vent  FAMILY  - Updates: i updated pt  - Inter-disciplinary family meet or Palliative Care meeting due by:  day 7  Ccm time 35 min   Lavon Paganini. Titus Mould, MD, [facility]

## 2015-11-17 NOTE — Progress Notes (Signed)
Leaking urine around foley catheter. Unable to contain. Md notified. Catheter removed and condom catheter placed. Pt tolerated well.

## 2015-11-18 ENCOUNTER — Inpatient Hospital Stay (HOSPITAL_COMMUNITY): Payer: Medicare Other

## 2015-11-18 DIAGNOSIS — R7881 Bacteremia: Secondary | ICD-10-CM

## 2015-11-18 DIAGNOSIS — B955 Unspecified streptococcus as the cause of diseases classified elsewhere: Secondary | ICD-10-CM | POA: Insufficient documentation

## 2015-11-18 DIAGNOSIS — I330 Acute and subacute infective endocarditis: Secondary | ICD-10-CM

## 2015-11-18 LAB — CULTURE, BLOOD (ROUTINE X 2)

## 2015-11-18 LAB — CBC
HCT: 25.2 % — ABNORMAL LOW (ref 39.0–52.0)
Hemoglobin: 7.8 g/dL — ABNORMAL LOW (ref 13.0–17.0)
MCH: 27.2 pg (ref 26.0–34.0)
MCHC: 31 g/dL (ref 30.0–36.0)
MCV: 87.8 fL (ref 78.0–100.0)
PLATELETS: 89 10*3/uL — AB (ref 150–400)
RBC: 2.87 MIL/uL — AB (ref 4.22–5.81)
RDW: 19.8 % — ABNORMAL HIGH (ref 11.5–15.5)
WBC: 23.6 10*3/uL — ABNORMAL HIGH (ref 4.0–10.5)

## 2015-11-18 LAB — BASIC METABOLIC PANEL
Anion gap: 12 (ref 5–15)
BUN: 50 mg/dL — AB (ref 6–20)
CALCIUM: 8.9 mg/dL (ref 8.9–10.3)
CHLORIDE: 107 mmol/L (ref 101–111)
CO2: 27 mmol/L (ref 22–32)
CREATININE: 1.56 mg/dL — AB (ref 0.61–1.24)
GFR calc non Af Amer: 38 mL/min — ABNORMAL LOW (ref >60)
GFR, EST AFRICAN AMERICAN: 44 mL/min — AB (ref >60)
GLUCOSE: 144 mg/dL — AB (ref 65–99)
Potassium: 4 mmol/L (ref 3.5–5.1)
Sodium: 146 mmol/L — ABNORMAL HIGH (ref 135–145)

## 2015-11-18 LAB — GLUCOSE, CAPILLARY
GLUCOSE-CAPILLARY: 136 mg/dL — AB (ref 65–99)
GLUCOSE-CAPILLARY: 100 mg/dL — AB (ref 65–99)
GLUCOSE-CAPILLARY: 105 mg/dL — AB (ref 65–99)
GLUCOSE-CAPILLARY: 117 mg/dL — AB (ref 65–99)
Glucose-Capillary: 149 mg/dL — ABNORMAL HIGH (ref 65–99)
Glucose-Capillary: 134 mg/dL — ABNORMAL HIGH (ref 65–99)

## 2015-11-18 LAB — APTT: aPTT: 69 seconds — ABNORMAL HIGH (ref 24–37)

## 2015-11-18 LAB — MAGNESIUM: MAGNESIUM: 2.2 mg/dL (ref 1.7–2.4)

## 2015-11-18 LAB — HEPARIN LEVEL (UNFRACTIONATED): Heparin Unfractionated: 0.31 IU/mL (ref 0.30–0.70)

## 2015-11-18 LAB — PHOSPHORUS: Phosphorus: 3.3 mg/dL (ref 2.5–4.6)

## 2015-11-18 MED ORDER — MIDAZOLAM HCL 2 MG/2ML IJ SOLN
INTRAMUSCULAR | Status: AC
  Filled 2015-11-18: qty 2

## 2015-11-18 MED ORDER — SODIUM CHLORIDE 0.9 % IV SOLN: INTRAVENOUS | Status: DC

## 2015-11-18 MED ORDER — FUROSEMIDE 10 MG/ML IJ SOLN
60.0000 mg | Freq: Three times a day (TID) | INTRAMUSCULAR | Status: DC
  Administered 2015-11-18 – 2015-11-19 (×4): 60 mg via INTRAVENOUS
  Filled 2015-11-18 (×5): qty 6

## 2015-11-18 MED ORDER — MIDAZOLAM HCL 2 MG/2ML IJ SOLN
4.0000 mg | Freq: Once | INTRAMUSCULAR | Status: AC
  Administered 2015-11-18: 4 mg via INTRAVENOUS

## 2015-11-18 MED ORDER — SODIUM CHLORIDE 0.9 % IV SOLN
INTRAVENOUS | Status: DC
  Administered 2015-11-17: 19:00:00 via INTRAVENOUS

## 2015-11-18 NOTE — Progress Notes (Signed)
ANTICOAGULATION CONSULT NOTE - follow-up Consult  Pharmacy Consult for heparin Indication: chest pain/ACS  Allergies  Allergen Reactions  . Amoxicillin Itching and Rash  . Penicillins Itching and Rash    Has patient had a PCN reaction causing immediate rash, facial/tongue/throat swelling, SOB or lightheadedness with hypotension: Yes Has patient had a PCN reaction causing severe rash involving mucus membranes or skin necrosis: No Has patient had a PCN reaction that required hospitalization No Has patient had a PCN reaction occurring within the last 10 years: No If all of the above answers are "NO", then may proceed with Cephalosporin use.     Patient Measurements: Height: 5\' 9"  (175.3 cm) Weight: 162 lb 0.6 oz (73.5 kg) IBW/kg (Calculated) : 70.7 Heparin Dosing Weight: 71 kg  Vital Signs: Temp: 97.8 F (36.6 C) (03/15 0850) Temp Source: Oral (03/15 1155) BP: 120/54 mmHg (03/15 1400) Pulse Rate: 60 (03/15 1300)  Labs:  Recent Labs  11/16/15 0930 11/16/15 1435 11/16/15 2210 11/17/15 0219 11/17/15 0825 11/18/15 0330 11/18/15 0818  HGB 9.2*  --   --  8.6*  --  7.8*  --   HCT 30.9*  --   --  28.8*  --  25.2*  --   PLT 83*  --   --  77*  --  89*  --   APTT 60*  --  68*  --  75*  --  69*  HEPARINUNFRC 0.52  --  0.38 0.33  --   --  0.31  CREATININE 1.80*  --   --  1.71*  --  1.56*  --   TROPONINI  --  3.04*  --   --   --   --   --     Estimated Creatinine Clearance: 32.7 mL/min (by C-G formula based on Cr of 1.56).  Assessment: 82 yo male with VF arrest on 3/10 > ROSC. Now with elevated troponin. Pt is on Xarelto PTA for history of AFib (last dose was 3/10 @ 1800) but was transitioned to a heparin drip. Heparin level remains therapeutic at 0.31. No bleeding noted.   Goal of Therapy:  APTT 66-102 sec Heparin level 0.3-0.7 units/ml Monitor platelets by anticoagulation protocol: Yes   Plan:  - Continue heparin gtt 1200 units/hr - Daily heparin level and CBC  Salome Arnt, PharmD, BCPS Pager # [phone] 11/18/2015 3:11 PM

## 2015-11-18 NOTE — Procedures (Signed)
Extubation Procedure Note  Patient Details:   Name: [name] [name] DOB: 03-03-1926 MRN: [id number]   Airway Documentation:     Evaluation  O2 sats: stable throughout Complications: No apparent complications Patient did tolerate procedure well. Bilateral Breath Sounds: Clear Suctioning: Airway Yes   Patient extubated to 2L nasal cannula per MD order.  Positive cuff leak noted.  No evidence of stridor.  Patient able to speak post extubation.  Vitals are stable.  No apparent complications.    Alphia Moh N 11/18/2015, 1:08 PM

## 2015-11-18 NOTE — Progress Notes (Signed)
Physical Therapy Treatment Patient Details Name: [name] [name] MRN: [id number] DOB: 08-03-1926 Today's Date: 11/18/2015    History of Present Illness presented with fever and dyspnea. r/o sepsis. +flu and CHF exacerbation PMHx-HIV, Hepatitis C, CAD, CHF, afib, dementia, COPD. Pt s/p cardiac arrest 3/10 with CPR for 8 mins requiring intubation 3/10-3/15.     PT Comments    Patient s/p cardiac arrest and CPR x10 minutes requiring intubation on 3/10-3/15 presents with marked weakness, decreased respiratory status, impaired endurance, and mobility. Tolerated SPT to chair with assist of 2. Limited by chest pain from compressions during CPR. Pt not safe to return home at this time due to decrease in mobility and function. Would benefit from ST SNF to maximize independence and mobilty prior to return home.  Follow Up Recommendations  SNF;Supervision/Assistance - 24 hour     Equipment Recommendations  Rolling walker with 5" wheels    Recommendations for Other Services OT consult     Precautions / Restrictions Precautions Precautions: Fall Precaution Comments: Multiple falls PTA. Watch 02 sats Restrictions Weight Bearing Restrictions: No    Mobility  Bed Mobility Overal bed mobility: Needs Assistance Bed Mobility: Supine to Sit     Supine to sit: +2 for physical assistance;HOB elevated;Mod assist     General bed mobility comments: Assist of 2 to get to EOB with cues for hand placement/technique.   Transfers Overall transfer level: Needs assistance Equipment used: 2 person hand held assist Transfers: Stand Pivot Transfers;Sit to/from Stand Sit to Stand: Mod assist;+2 physical assistance Stand pivot transfers: Max assist;+2 physical assistance       General transfer comment: Max A of 2 for SPT to chair with cues for technique. Pt reports chest pain (most likely from CPR). Sp02 dropped to 79% on 3L/min 02. Cues for pursed lip breathing.  Ambulation/Gait Ambulation/Gait  assistance:  (Not assessed this session as pt with decreased respiratory status and weakness.)               Stairs            Wheelchair Mobility    Modified Rankin (Stroke Patients Only)       Balance Overall balance assessment: Needs assistance Sitting-balance support: Feet supported;Bilateral upper extremity supported Sitting balance-Leahy Scale: Poor Sitting balance - Comments: Pt with Mod A for sitting balance progressing to Min A due to cues for forward lean. Sat EOB ~8 minutes.   Standing balance support: During functional activity;Bilateral upper extremity supported Standing balance-Leahy Scale: Zero Standing balance comment: Assist of 2 to transfer to chair but not able to stand without support.                     Cognition Arousal/Alertness: Awake/alert Behavior During Therapy: WFL for tasks assessed/performed Overall Cognitive Status: History of cognitive impairments - at baseline (Able to find date on wall and state "March")       Memory: Decreased short-term memory (Pt not able to recall ambulating with PT in hospital prior to code blue.)              Exercises      General Comments        Pertinent Vitals/Pain Pain Assessment: Faces Faces Pain Scale: Hurts even more Pain Location: chest Pain Descriptors / Indicators: Sore Pain Intervention(s): Monitored during session;Repositioned;Limited activity within patient's tolerance    Home Living  Prior Function            PT Goals (current goals can now be found in the care plan section) Progress towards PT goals: Progressing toward goals (slowly as pt just extubated today.)    Frequency  Min 2X/week    PT Plan Current plan remains appropriate    Co-evaluation             End of Session Equipment Utilized During Treatment: Gait belt;Oxygen Activity Tolerance: Treatment limited secondary to medical complications (Comment) (drop in Sp02 to  79% on 3L/min 02 [name] during session.) Patient left: in chair;with call bell/phone within reach;with chair alarm set;with nursing/sitter in room     Time: [id number] PT Time Calculation (min) (ACUTE ONLY): 26 min  Charges:  $Therapeutic Activity: 8-22 mins                    G Codes:      [name] [name] A [name] [name] 11/18/2015, 3:34 PM [name] [name], [name] [name], DPT [phone]

## 2015-11-18 NOTE — Progress Notes (Signed)
  Echocardiogram Echocardiogram Transesophageal has been performed.  [name] [name] 11/18/2015, 9:54 AM

## 2015-11-18 NOTE — Procedures (Signed)
INDICATIONS: infective endocarditis  PROCEDURE:   Informed consent was obtained prior to the procedure. The risks, benefits and alternatives for the procedure were discussed and the patient comprehended these risks.  Risks include, but are not limited to, cough, sore throat, vomiting, nausea, somnolence, esophageal and stomach trauma or perforation, bleeding, low blood pressure, aspiration, pneumonia, infection, trauma to the teeth and death.    After a procedural time-out, the oropharynx was anesthetized with 20% benzocaine spray. The patient was given 4 mg versed and 50 mcg fentanyl for moderate sedation.   The transesophageal probe was inserted in the esophagus and stomach without difficulty and multiple views were obtained.  The patient was kept under observation until the patient left the procedure room.  The patient left the procedure room in stable condition.   Agitated microbubble saline contrast was not administered.  COMPLICATIONS:    There were no immediate complications.  FINDINGS:   No vegetations seen Trace AI, mild MR, moderate TR. Moderate to severely depressed LVEF 30%, diffuse hypokinesis. Severely dilated left atrium. Despite NSR, there is severe spontaneous echo contrast in the lefta trial appendage. No thrombus seen. Velocities are low. Estimated sPAP approx 50 mm Hg   Time Spent Directly with the Patient:  30 minutes   [name] [name] 11/18/2015, 9:19 AM

## 2015-11-18 NOTE — Progress Notes (Signed)
PULMONARY / CRITICAL CARE MEDICINE   Name: [name] [name] MRN: [id number] DOB: 09-23-1925    ADMISSION DATE:  11/10/2015 CONSULTATION DATE:  11/13/2015  REFERRING MD:  [name] [name]  CHIEF COMPLAINT:  VF arrest, hypoxemic respiratory failure  HISTORY OF PRESENT ILLNESS:   75M admitted 11/10/2015 with acute hypoxemic respiratory failure secondary to acute influenza A. Subsequent workup also revealed positive blood cultures with GPCs in chains. He has been treated with vanc, rocephin and azithro as well as tamiflu. He was given IVF, but has a history of HFpEF and developed worsening respiratory distress. He has a significant cardiac history with prior A fib and is s/p pacemaker placement. He was noted to have non-sustained VT on the monitor earlier in the admission. 3/10 around 10:30pm he was noted to be in VF on the monitor and was found unresponsive and pulseless. Code Blue was called and per report (documentation not yet reviewed) he received CPR, 3 rounds of epi, was shocked twice and given an amp of HCO3. Pulses returned prior to amiodarone bolus being given. He was moved to 8M. His family was contacted; his HCPOA is unable to come to the hospital. His daughter is en route from [place].   [place] is significant for COPD, prior CABG, A fib and Mobitz II heart block s/p pacemaker placement, chronic leukocytosis, congestive heart failure, dementia. He moved in with his sister a few months ago and she is his 17.   Subjective:  TEE done, no distress on wean  VITAL SIGNS: BP 121/57 mmHg  Pulse 63  Temp(Src) 97.8 F (36.6 C) (Oral)  Resp 18  Ht 5\' 9"  (1.753 m)  Wt 73.5 kg (162 lb 0.6 oz)  BMI 23.92 kg/m2  SpO2 96%  HEMODYNAMICS:    VENTILATOR SETTINGS: Vent Mode:  [-] PSV;CPAP FiO2 (%):  [30 %] 30 % Set Rate:  [18 bmp] 18 bmp Vt Set:  [550 mL] 550 mL PEEP:  [5 cmH20] 5 cmH20 Pressure Support:  [5 cmH20-10 cmH20] 5 cmH20 Plateau Pressure:  [16 cmH20-27 cmH20] 25 cmH20  INTAKE /  OUTPUT: I/O last 3 completed shifts: In: 2961 [I.V.:1476; NG/GT:1235; IV Piggyback:250] Out: 2760 [Urine:2760]  PHYSICAL EXAMINATION: Physical Exam: Temp:  [97.6 F (36.4 C)-98.7 F (37.1 C)] 97.8 F (36.6 C) (03/15 0850) Pulse Rate:  [55-72] 63 (03/15 0900) Resp:  [17-26] 18 (03/15 0900) BP: (94-135)/(52-112) 121/57 mmHg (03/15 0900) SpO2:  [91 %-100 %] 96 % (03/15 0900) FiO2 (%):  [30 %] 30 % (03/15 0742) Weight:  [73.5 kg (162 lb 0.6 oz)] 73.5 kg (162 lb 0.6 oz) (03/15 0315)  General , thin elderly male on vent   HEENt jvd down  Pulmonary Coarse bilateral now clear anterior  Cardiovascular Regular rhythm. S1, s2. No m/r/g. Distal pulses palpable.  Abdomen Soft, non-tender, non-distended, positive bowel sounds wnl  Musculoskeletal No bony abnormalities  Lymphatics none  Neurologic rass 0  Skin/Integuement No rash   LABS:  BMET  Recent Labs Lab 11/16/15 0930 11/17/15 0219 11/18/15 0330  NA 145 146* 146*  K 4.3 4.2 4.0  CL 108 108 107  CO2 28 29 27   BUN 44* 52* 50*  CREATININE 1.80* 1.71* 1.56*  GLUCOSE 125* 135* 144*    Electrolytes  Recent Labs Lab 11/15/15 0345 11/15/15 1247 11/16/15 0930 11/17/15 0219 11/18/15 0330  CALCIUM 8.5* 8.6* 9.1 8.9 8.9  MG 2.1 2.1  --   --  2.2  PHOS 2.5 1.9*  --   --  3.3  CBC  Recent Labs Lab 11/16/15 0930 11/17/15 0219 11/18/15 0330  WBC 24.7* 21.1* 23.6*  HGB 9.2* 8.6* 7.8*  HCT 30.9* 28.8* 25.2*  PLT 83* 77* 89*    Coag's  Recent Labs Lab 11/16/15 2210 11/17/15 0825 11/18/15 0818  APTT 68* 75* 69*    Sepsis Markers  Recent Labs Lab 11/12/15 1526 11/13/15 2316 11/14/15 0306  LATICACIDVEN 1.8 4.3* 1.9    ABG  Recent Labs Lab 11/13/15 2350 11/16/15 1145  PHART 7.378 7.456*  PCO2ART 42.7 39.1  PO2ART 294* 63.0*    Liver Enzymes  Recent Labs Lab 11/17/15 0219  AST 27  ALT 30  ALKPHOS 73  BILITOT 0.6  ALBUMIN 2.7*    Cardiac Enzymes  Recent Labs Lab 11/14/15 1051  11/15/15 1247 11/16/15 1435  TROPONINI 7.34* 3.82* 3.04*    Glucose  Recent Labs Lab 11/17/15 0807 11/17/15 1151 11/17/15 2034 11/17/15 2329 11/18/15 0349 11/18/15 0848  GLUCAP 103* 86 132* 149* 136* 134*    Imaging No results found.   STUDIES:  3/9 TTE  - EF 30-35%, Moderate diffuse hypokinesis. Regional wall  motion abnormalities cannot be excluded.  mitral regurgitation - Left atrium: The atrium was severely dilated. - Right ventricle: The cavity size was mildly dilated. Wall  thickness was normal. Systolic function was mildly reduced. - Right atrium: The atrium was moderately to severely dilated. - Pulmonary arteries: Systolic pressure was moderately increased.  PA peak pressure: 59 mm Hg (S).  TEE 3/15- neg veg  CULTURES: Blood 3/7 >> GPC in chains, GPR, >ROTHIA species/Viridans Strep Urine 3/7 >> NGTD>NEG  Sputum 3/7 >> normal flora  ANTIBIOTICS: Tamiflu 3/7 >>12th Vancomycin 3/7 >>> Ceftriaxone 3/7 >> 3/9; 3/10 >> Azithromycin 3/7>>3/9  SIGNIFICANT EVENTS: VF arrest 3/10 Card Consult 3/10  TEE neg 3/15  LINES/TUBES: ETT / OGT 3/10>>> Foley 3/10  DISCUSSION: [name] is an 73M s/p VF arrest in the setting of sepsis secondary to Influenza A and bacteremia with GPC in chains, decompensated mixed CHF, underlying COPD, history of A fib on xarelto, prior CABG, history of mobitz II heart block, and dementia.    ASSESSMENT / PLAN:  PULMONARY A: Acute hypoxemic respiratory failure  ?Asymmetrical Pulmonary Edema vs aspiration PNA  P:   weaning cpap 5 ps 5, goal 1 hr, assess rsbi, appears best he has done DNI established with planned extubation Lasix to further neg balance  CARDIOVASCULAR A:  VF arrest  3/10 Non-sustained VT Decompensated systolic and diastolic CHF Hx A fib on Xarelto (held) Hx CAD s/p CABG Hx Mobitz II heart block  Pacemaker Elevated Troponin  R/o endocarditis P:  Cont Amiodarone  Tee reviewed live Continue on  Hep  Drip Lasix increase  RENAL A:   Metabolic acidosis 2/2 arrest  CKD II-III (baseline Cr ~1.2) P:   Pos balance, edema pcxr - lasix, increase to q8h Chem in am  kvo  GASTROINTESTINAL A:   No acute issues P:   TF  On hold PPI for gi ppx while intubated  HEMATOLOGIC A:   hemoconcetration 3/15 Chronic thrombocytopenia - Plat tr down (on hep)   P:  Trend CBC Transfuse per protocol  heparin  INFECTIOUS A:   Sepsis secondary to influenza A, bacteremia (ROTHIA/Viridans Strep) P:   Continue vancomycin  Until we get sens Cont ceftriaxone ensure repeat BC neg, neg thus far  ENDOCRINE A:   Hyperglycemia  P:   Cont SSI   NEUROLOGIC A:   Hx dementia S/p arrest 3/10 >hypothermia  deferred given patient age, active infection, current neuro exam and hx of dementia  P:   RASS goal: 0 Put in chair Follow exam , doing well  FAMILY  - Updates: i updated pt med poa,  I have had extensive discussions with family med poa. We discussed patients current circumstances and organ failures. We also discussed patient's prior wishes under circumstances such as this. Family has decided to NOT perform resuscitation if arrest but to continue current medical support for now.   - Inter-disciplinary family meet or Palliative Care meeting due by:  Done DF 15th  Ccm time 35 min   Lavon Paganini. Titus Mould, MD, [place] [facility]gr: [facility]

## 2015-11-19 LAB — BASIC METABOLIC PANEL
Anion gap: 12 (ref 5–15)
BUN: 47 mg/dL — AB (ref 6–20)
CALCIUM: 8.9 mg/dL (ref 8.9–10.3)
CO2: 32 mmol/L (ref 22–32)
CREATININE: 1.95 mg/dL — AB (ref 0.61–1.24)
Chloride: 102 mmol/L (ref 101–111)
GFR calc Af Amer: 34 mL/min — ABNORMAL LOW (ref >60)
GFR, EST NON AFRICAN AMERICAN: 29 mL/min — AB (ref >60)
Glucose, Bld: 100 mg/dL — ABNORMAL HIGH (ref 65–99)
Potassium: 3.8 mmol/L (ref 3.5–5.1)
SODIUM: 146 mmol/L — AB (ref 135–145)

## 2015-11-19 LAB — HEPARIN LEVEL (UNFRACTIONATED): Heparin Unfractionated: 0.35 IU/mL (ref 0.30–0.70)

## 2015-11-19 LAB — GLUCOSE, CAPILLARY
GLUCOSE-CAPILLARY: 104 mg/dL — AB (ref 65–99)
GLUCOSE-CAPILLARY: 92 mg/dL (ref 65–99)
GLUCOSE-CAPILLARY: 109 mg/dL — AB (ref 65–99)
GLUCOSE-CAPILLARY: 92 mg/dL (ref 65–99)
GLUCOSE-CAPILLARY: 84 mg/dL (ref 65–99)
Glucose-Capillary: 107 mg/dL — ABNORMAL HIGH (ref 65–99)
Glucose-Capillary: 114 mg/dL — ABNORMAL HIGH (ref 65–99)

## 2015-11-19 LAB — PHOSPHORUS: PHOSPHORUS: 4.1 mg/dL (ref 2.5–4.6)

## 2015-11-19 LAB — CBC
HCT: 30.2 % — ABNORMAL LOW (ref 39.0–52.0)
Hemoglobin: 8.9 g/dL — ABNORMAL LOW (ref 13.0–17.0)
MCH: 26.8 pg (ref 26.0–34.0)
MCHC: 29.5 g/dL — ABNORMAL LOW (ref 30.0–36.0)
MCV: 91 fL (ref 78.0–100.0)
PLATELETS: 96 10*3/uL — AB (ref 150–400)
RBC: 3.32 MIL/uL — ABNORMAL LOW (ref 4.22–5.81)
RDW: 19.5 % — AB (ref 11.5–15.5)
WBC: 29.1 10*3/uL — AB (ref 4.0–10.5)

## 2015-11-19 LAB — MAGNESIUM: MAGNESIUM: 2.2 mg/dL (ref 1.7–2.4)

## 2015-11-19 LAB — APTT: APTT: 71 s — AB (ref 24–37)

## 2015-11-19 MED ORDER — MORPHINE SULFATE (PF) 2 MG/ML IV SOLN
2.0000 mg | INTRAVENOUS | Status: DC | PRN
  Administered 2015-11-19 – 2015-11-29 (×16): 2 mg via INTRAVENOUS
  Filled 2015-11-19 (×16): qty 1

## 2015-11-19 MED ORDER — FUROSEMIDE 10 MG/ML IJ SOLN
60.0000 mg | Freq: Two times a day (BID) | INTRAMUSCULAR | Status: DC
  Administered 2015-11-19 – 2015-11-22 (×6): 60 mg via INTRAVENOUS
  Filled 2015-11-19 (×6): qty 6

## 2015-11-19 NOTE — Progress Notes (Signed)
ANTICOAGULATION CONSULT NOTE - follow-up Consult  Pharmacy Consult for heparin Indication: chest pain/ACS  Allergies  Allergen Reactions  . Amoxicillin Itching and Rash  . Penicillins Itching and Rash    Has patient had a PCN reaction causing immediate rash, facial/tongue/throat swelling, SOB or lightheadedness with hypotension: Yes Has patient had a PCN reaction causing severe rash involving mucus membranes or skin necrosis: No Has patient had a PCN reaction that required hospitalization No Has patient had a PCN reaction occurring within the last 10 years: No If all of the above answers are "NO", then may proceed with Cephalosporin use.     Patient Measurements: Height: 5\' 11"  (180.3 cm) Weight: 149 lb 8 oz (67.813 kg) (bed) IBW/kg (Calculated) : 75.3 Heparin Dosing Weight: 71 kg  Vital Signs: Temp: 97.8 F (36.6 C) (03/16 0430) Temp Source: Oral (03/16 0430) BP: 102/54 mmHg (03/16 0430) Pulse Rate: 59 (03/16 0430)  Labs:  Recent Labs  11/16/15 1435  11/17/15 0219 11/17/15 0825 11/18/15 0330 11/18/15 0818 11/19/15 0440  HGB  --   --  8.6*  --  7.8*  --  8.9*  HCT  --   --  28.8*  --  25.2*  --  30.2*  PLT  --   --  77*  --  89*  --  96*  APTT  --   < >  --  75*  --  69* 71*  HEPARINUNFRC  --   < > 0.33  --   --  0.31 0.35  CREATININE  --   --  1.71*  --  1.56*  --  1.95*  TROPONINI 3.04*  --   --   --   --   --   --   < > = values in this interval not displayed.  Estimated Creatinine Clearance: 25.1 mL/min (by C-G formula based on Cr of 1.95).  Assessment: 82 yo male with VF arrest on 3/10 > ROSC. Now with elevated troponin. Pt is on Xarelto PTA for history of AFib (last dose was 3/10 @ 1800) but was transitioned to a heparin drip. Heparin level remains therapeutic at 0.35. No bleeding noted.   Goal of Therapy:  APTT 66-102 sec Heparin level 0.3-0.7 units/ml Monitor platelets by anticoagulation protocol: Yes   Plan:  1. Continue heparin gtt 1200  units/hr 2. Daily heparin level and CBC  Vincenza Hews, PharmD, BCPS 11/19/2015, 7:48 AM Pager: [phone]

## 2015-11-19 NOTE — Progress Notes (Addendum)
[facility] PROGRESS NOTE  [name] [name] [id number] DOB: 04-02-1926 DOA: 11/10/2015 PCP: Odette Fraction, MD  HPI/Brief narrative Please see admission H&P from 11/10/2015. Briefly the patient is an 82 year old male with a history of COPD, CAD status post CABG, A. fib, dementia who was initially admitted with acute proxy make rest 3 failure secondary to influenza a. During the course, the patient was noted to have worsening respiratory distress. He later had nonsustained V. tach and later found to be unresponsive and pulseless. Patient was intubated and transferred to ICU. Patient since been successfully extubated on 11/18/2015 and care transferred to the hospitalist service  Assessment/Plan: 1. Acute hypoxemic respiratory failure 1. Successfully extubated on 3/15. Stable at present 2. Patient on scheduled IV lasix given CXR findings worrisome for pulm edema 3. Currently on 4L [place]. Cont to wean O2 as tolerated 2. NSVT 1. Pt continued on amiodarone 2. Seems stable at present 3. Hx afib 1. Rate controlled 2. CHADS-VASc 2, previously on xarelto. 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 Baseline Cr around 1.2 2. Cr had remained in the 1.5-1.7 range, suspect secondary to end-organ damage from shock vs sepsis 5. Thrombocytopenia 1. Improving 2. Possibly related to heparin vs end-organ damage as plts noted to be 51k on day of admit 6. Flu A with sepsis present on admission 7. Viridians strep bacteremia 1. Had been on empiric vanc and rocephin. Now on rocephin alone based on sensitivities 8. Dementia 1. stable 9. DVT prophylaxis 1. On heparin gtt  Code Status: DNR Family Communication: Pt in room Disposition Plan: Uncertain at this time   Consultants:  Critical Care  Procedures:    Antibiotics: Anti-infectives    Start     Dose/Rate Route Frequency Ordered Stop   11/16/15 1545  vancomycin (VANCOCIN) IVPB 750 mg/150 ml premix  Status:  Discontinued     750  mg 150 mL/hr over 60 Minutes Intravenous Every 24 hours 11/16/15 1543 11/18/15 1503   11/15/15 0000  vancomycin (VANCOCIN) 1,250 mg in sodium chloride 0.9 % 250 mL IVPB  Status:  Discontinued     1,250 mg 166.7 mL/hr over 90 Minutes Intravenous Every 24 hours 11/14/15 0653 11/15/15 1527   11/13/15 2345  cefTRIAXone (ROCEPHIN) 2 g in dextrose 5 % 50 mL IVPB     2 g 100 mL/hr over 30 Minutes Intravenous Daily at bedtime 11/13/15 2327     11/12/15 0900  cefTRIAXone (ROCEPHIN) 2 g in dextrose 5 % 50 mL IVPB  Status:  Discontinued     2 g 100 mL/hr over 30 Minutes Intravenous Every 24 hours 11/11/15 1323 11/11/15 1433   11/11/15 2200  oseltamivir (TAMIFLU) 6 MG/ML suspension 30 mg     30 mg Oral 2 times daily 11/11/15 1340 11/14/15 2152   11/11/15 1400  cefTRIAXone (ROCEPHIN) 1 g in dextrose 5 % 50 mL IVPB  Status:  Discontinued     1 g 100 mL/hr over 30 Minutes Intravenous  Once 11/11/15 1324 11/11/15 1433   11/11/15 1200  azithromycin (ZITHROMAX) 500 mg in dextrose 5 % 250 mL IVPB  Status:  Discontinued     500 mg 250 mL/hr over 60 Minutes Intravenous Every 24 hours 11/10/15 1318 11/11/15 1433   11/11/15 0900  cefTRIAXone (ROCEPHIN) 1 g in dextrose 5 % 50 mL IVPB  Status:  Discontinued     1 g 100 mL/hr over 30 Minutes Intravenous Every 24 hours 11/10/15 1318 11/11/15 1323   11/11/15 0600  vancomycin (VANCOCIN)  IVPB 1000 mg/200 mL premix  Status:  Discontinued     1,000 mg 200 mL/hr over 60 Minutes Intravenous Every 24 hours 11/11/15 0528 11/14/15 0653   11/10/15 1500  oseltamivir (TAMIFLU) 6 MG/ML suspension 75 mg  Status:  Discontinued     75 mg Oral 2 times daily 11/10/15 1448 11/11/15 1340   11/10/15 1130  azithromycin (ZITHROMAX) 500 mg in dextrose 5 % 250 mL IVPB     500 mg 250 mL/hr over 60 Minutes Intravenous  Once 11/10/15 1127 11/10/15 1319   11/10/15 0830  cefTRIAXone (ROCEPHIN) 1 g in dextrose 5 % 50 mL IVPB     1 g 100 mL/hr over 30 Minutes Intravenous  Once 11/10/15 0828  11/10/15 1222      HPI/Subjective: Wants to have ice water  Objective: Filed Vitals:   11/19/15 0015 11/19/15 0430 11/19/15 0938 11/19/15 1218  BP: 108/52 102/54  137/47  Pulse: 59 59  66  Temp: 97.9 F (36.6 C) 97.8 F (36.6 C)  97.2 F (36.2 C)  TempSrc: Oral Oral  Oral  Resp: 21 20  18   Height:      Weight:  67.813 kg (149 lb 8 oz)    SpO2: 97% 100% 97% 98%    Intake/Output Summary (Last 24 hours) at 11/19/15 1554 Last data filed at 11/19/15 0900  Gross per 24 hour  Intake  480.5 ml  Output   1850 ml  Net -1369.5 ml   Filed Weights   11/18/15 0315 11/18/15 1858 11/19/15 0430  Weight: 73.5 kg (162 lb 0.6 oz) 68.811 kg (151 lb 11.2 oz) 67.813 kg (149 lb 8 oz)    Exam:   General:  Awake, in nad  Cardiovascular: regular, s1, s2  Respiratory: normal resp effort, no wheezing  Abdomen: soft, nondistended  Musculoskeletal: perfused, no clubbing   Data Reviewed: Basic Metabolic Panel:  Recent Labs Lab 11/13/15 2317  11/15/15 0345 11/15/15 1247 11/16/15 0930 11/17/15 0219 11/18/15 0330 11/19/15 0440  NA 144  < > 144 144 145 146* 146* 146*  K 3.4*  < > 3.3* 4.3 4.3 4.2 4.0 3.8  CL 106  < > 105 106 108 108 107 102  CO2 24  < > 26 26 28 29 27  32  GLUCOSE 188*  < > 138* 176* 125* 135* 144* 100*  BUN 31*  < > 34* 38* 44* 52* 50* 47*  CREATININE 1.47*  < > 1.73* 1.77* 1.80* 1.71* 1.56* 1.95*  CALCIUM 8.2*  < > 8.5* 8.6* 9.1 8.9 8.9 8.9  MG 2.4  --  2.1 2.1  --   --  2.2 2.2  PHOS  --   --  2.5 1.9*  --   --  3.3 4.1  < > = values in this interval not displayed. Liver Function Tests:  Recent Labs Lab 11/17/15 0219  AST 27  ALT 30  ALKPHOS 73  BILITOT 0.6  PROT 5.6*  ALBUMIN 2.7*   No results for input(s): LIPASE, AMYLASE in the last 168 hours. No results for input(s): AMMONIA in the last 168 hours. CBC:  Recent Labs Lab 11/15/15 0345 11/16/15 0930 11/17/15 0219 11/18/15 0330 11/19/15 0440  WBC 15.4* 24.7* 21.1* 23.6* 29.1*  HGB 8.1*  9.2* 8.6* 7.8* 8.9*  HCT 26.7* 30.9* 28.8* 25.2* 30.2*  MCV 89.0 89.8 90.3 87.8 91.0  PLT 60* 83* 77* 89* 96*   Cardiac Enzymes:  Recent Labs Lab 11/13/15 2317 11/14/15 0310 11/14/15 1051 11/15/15 1247 11/16/15 1435  TROPONINI 0.05* 6.03* 7.34* 3.82* 3.04*   BNP (last 3 results)  Recent Labs  11/10/15 1456 11/16/15 0353  BNP 1792.8* 406.2*    ProBNP (last 3 results) No results for input(s): PROBNP in the last 8760 hours.  CBG:  Recent Labs Lab 11/18/15 2042 11/19/15 0014 11/19/15 0422 11/19/15 0744 11/19/15 1128  GLUCAP 117* 107* 92 109* 92    Recent Results (from the past 240 hour(s))  Culture, blood (routine x 2)     Status: None   Collection Time: 11/10/15  9:00 AM  Result Value Ref Range Status   Specimen Description BLOOD RIGHT ANTECUBITAL  Final   Special Requests BOTTLES DRAWN AEROBIC AND ANAEROBIC 5CC  Final   Culture  Setup Time   Final    GRAM POSITIVE COCCI IN CHAINS IN BOTH AEROBIC AND ANAEROBIC BOTTLES CRITICAL RESULT CALLED TO, READ BACK BY AND VERIFIED WITH: C CISON,RN @0508  11/11/15 MKELLY    Culture   Final    ROTHIA SPECIES Standardized susceptibility testing for this organism is not available. VIRIDANS STREPTOCOCCUS SUSCEPTIBILITIES PERFORMED ON PREVIOUS CULTURE WITHIN THE LAST 5 DAYS. PREVIOUSLY REPORTED AS: GRAM POSITIVE RODS CORRECTED RESULTS CALLED TO: DR Vernell Barrier 11/14/15 @ 0850 M VESTAL    Report Status 11/18/2015 FINAL  Final  Culture, blood (routine x 2)     Status: None   Collection Time: 11/10/15  9:10 AM  Result Value Ref Range Status   Specimen Description BLOOD RIGHT ARM  Final   Special Requests BOTTLES DRAWN AEROBIC AND ANAEROBIC 5CC  Final   Culture  Setup Time   Final    GRAM POSITIVE COCCI IN CHAINS IN BOTH AEROBIC AND ANAEROBIC BOTTLES CRITICAL RESULT CALLED TO, READ BACK BY AND VERIFIED WITH: C CISON,RN @0509  11/11/15 MKELLY    Culture   Final    ROTHIA SPECIES Standardized susceptibility testing for this  organism is not available. VIRIDANS STREPTOCOCCUS    Report Status 11/18/2015 FINAL  Final   Organism ID, Bacteria VIRIDANS STREPTOCOCCUS  Final      Susceptibility   Viridans streptococcus - MIC*    PENICILLIN <=0.06 SENSITIVE Sensitive     CEFTRIAXONE <=0.12 SENSITIVE Sensitive     ERYTHROMYCIN 2 RESISTANT Resistant     LEVOFLOXACIN 4 INTERMEDIATE Intermediate     VANCOMYCIN 0.5 SENSITIVE Sensitive     * VIRIDANS STREPTOCOCCUS  Urine culture     Status: None   Collection Time: 11/10/15 10:21 AM  Result Value Ref Range Status   Specimen Description URINE, CATHETERIZED  Final   Special Requests NONE  Final   Culture 1,000 COLONIES/mL INSIGNIFICANT GROWTH  Final   Report Status 11/11/2015 FINAL  Final  Culture, sputum-assessment     Status: None   Collection Time: 11/10/15  2:11 PM  Result Value Ref Range Status   Specimen Description SPUTUM  Final   Special Requests NONE  Final   Sputum evaluation   Final    THIS SPECIMEN IS ACCEPTABLE. RESPIRATORY CULTURE REPORT TO FOLLOW.   Report Status 11/11/2015 FINAL  Final  Culture, respiratory (NON-Expectorated)     Status: None   Collection Time: 11/10/15  2:11 PM  Result Value Ref Range Status   Specimen Description SPUTUM  Final   Special Requests NONE  Final   Gram Stain   Final    ABUNDANT WBC PRESENT,BOTH PMN AND MONONUCLEAR FEW SQUAMOUS EPITHELIAL CELLS PRESENT FEW GRAM POSITIVE COCCI IN PAIRS FEW GRAM POSITIVE RODS THIS SPECIMEN IS ACCEPTABLE FOR SPUTUM CULTURE Performed at [facility]    Culture   Final    NORMAL OROPHARYNGEAL FLORA Performed at [facility]    Report Status 11/13/2015 FINAL  Final  Gram stain     Status: None   Collection Time: 11/12/15  6:09 PM  Result Value Ref Range Status   Specimen Description TRACHEAL SITE  Final   Special Requests NONE  Final   Gram Stain   Final    ABUNDANT WBC PRESENT,BOTH PMN AND MONONUCLEAR FEW GRAM NEGATIVE RODS RARE GRAM POSITIVE RODS RARE GRAM  POSITIVE COCCI IN PAIRS SQUAMOUS EPITHELIAL CELLS PRESENT Gram Stain Report Called to,Read Back By and Verified With: D EVERETTE RN 1944 11/12/15 A BROWNING    Report Status 11/12/2015 FINAL  Final  MRSA PCR Screening     Status: None   Collection Time: 11/13/15  9:10 AM  Result Value Ref Range Status   MRSA by PCR NEGATIVE NEGATIVE Final    Comment:        The GeneXpert MRSA Assay (FDA approved for NASAL specimens only), is one component of a comprehensive MRSA colonization surveillance program. It is not intended to diagnose MRSA infection nor to guide or monitor treatment for MRSA infections.   Culture, blood (Routine X 2) w Reflex to ID Panel     Status: None (Preliminary result)   Collection Time: 11/16/15 12:02 PM  Result Value Ref Range Status   Specimen Description BLOOD LEFT ANTECUBITAL  Final   Special Requests BOTTLES DRAWN AEROBIC AND ANAEROBIC 5CC  Final   Culture NO GROWTH 3 DAYS  Final   Report Status PENDING  Incomplete  Culture, blood (Routine X 2) w Reflex to ID Panel     Status: None (Preliminary result)   Collection Time: 11/16/15  2:21 PM  Result Value Ref Range Status   Specimen Description BLOOD LEFT ANTECUBITAL  Final   Special Requests BOTTLES DRAWN AEROBIC AND ANAEROBIC [place]  Final   Culture NO GROWTH 3 DAYS  Final   Report Status PENDING  Incomplete     Studies: No results found.  Scheduled Meds: . antiseptic oral rinse  7 mL Mouth Rinse QID  . arformoterol  15 mcg Nebulization BID  . budesonide (PULMICORT) nebulizer solution  0.25 mg Nebulization BID  . cefTRIAXone (ROCEPHIN)  IV  2 g Intravenous QHS  . chlorhexidine gluconate  15 mL Mouth Rinse BID  . donepezil  5 mg Oral QHS  . feeding supplement (PRO-STAT SUGAR FREE 64)  30 mL Per Tube BID  . furosemide  60 mg Intravenous BID  . insulin aspart  0-15 Units Subcutaneous 6 times per day  . omeprazole  40 mg Per Tube Daily  . simvastatin  10 mg Oral q1800  . tamsulosin  0.4 mg Oral Daily    Continuous Infusions: . sodium chloride    . sodium chloride 10 mL/hr at 11/17/15 1900  . amiodarone 30 mg/hr (11/19/15 1035)  . heparin 1,200 Units/hr (11/18/15 1505)    Active Problems:   Acute systolic heart failure (HCC)   Sepsis due to pneumonia ([place])   Acute encephalopathy   Influenza with pneumonia   Hypotension   Protein-calorie malnutrition, severe ([place])   Cardiac arrest ([place])   Polymorphic ventricular tachycardia (HCC)   Pressure ulcer   Encounter for imaging study to confirm orogastric (OG) tube placement   Endotracheally intubated   Bacteremia due to Streptococcus   Zuley Lutter, [place] [facility] Pager [phone]. If 7PM-7AM, please contact night-coverage at [email or web address],  password [facility] 11/19/2015, 3:54 PM  LOS: 8 days

## 2015-11-19 NOTE — Progress Notes (Signed)
CSW continuing to follow pt for potential transfer to SNF when medically stable- family has chosen [facility]  CSW discussed case with MD- currently pt has multiple medical issues- MD will evaluate pt again tomorrow- possible palliative consult if pt is not improved  Domenica Reamer, [place] Social Worker [phone]

## 2015-11-19 NOTE — Progress Notes (Signed)
Nutrition Follow-up  DOCUMENTATION CODES:   Severe malnutrition in context of acute illness/injury  INTERVENTION:  -RD continue to monitor -Diet advancement per MD  NUTRITION DIAGNOSIS:   Malnutrition related to acute illness as evidenced by moderate depletions of muscle mass, severe depletion of muscle mass, energy intake < or equal to 50% for > or equal to 5 days.  GOAL:   Patient will meet greater than or equal to 90% of their needs  MONITOR:   Vent status, TF tolerance, Labs, I & O's  ASSESSMENT:   82 year old male with past medical history of CAD, diastolic CHF, atrial fibrillation, and COPD who presented to the emergency room on 3/7 after several days of generalized weakness and confusion plus fever and productive cough with brown sputum. Influenza A cultures positive as were blood cultures for gram-positive cocci in chains.   Mr. Gaj is awake and alert, has been extubated, currently NPO awaiting TEE.  He is very thirsty, wants water. Complains he hasn't had food or water since they extubated him. He complains of pain from throat to stomach. No Nausea/Vomiting. Currently weight is up 4# from admission. Per MD, possible Pulm Edema, will continue to follow for diet advancement and GOC.  Diet Order:  Diet NPO time specified  Skin:  Wound (see comment) (stage 1 pressure ulcer to sacrum)  Last BM:  3/11  Height:   Ht Readings from Last 1 Encounters:  11/18/15 5\' 11"  (1.803 m)    Weight:   Wt Readings from Last 1 Encounters:  11/19/15 149 lb 8 oz (67.813 kg)    Ideal Body Weight:  72.7 kg  BMI:  Body mass index is 20.86 kg/(m^2).  Estimated Nutritional Needs:   Kcal:  1600  Protein:  100-120 gm  Fluid:  1.8 L  EDUCATION NEEDS:   No education needs identified at this time  [name]. Kaluzny, MS, RD LDN After Hours/Weekend Pager [phone]

## 2015-11-20 ENCOUNTER — Inpatient Hospital Stay (HOSPITAL_COMMUNITY): Payer: Medicare Other

## 2015-11-20 LAB — GLUCOSE, CAPILLARY
GLUCOSE-CAPILLARY: 109 mg/dL — AB (ref 65–99)
GLUCOSE-CAPILLARY: 119 mg/dL — AB (ref 65–99)
Glucose-Capillary: 103 mg/dL — ABNORMAL HIGH (ref 65–99)
Glucose-Capillary: 128 mg/dL — ABNORMAL HIGH (ref 65–99)
Glucose-Capillary: 153 mg/dL — ABNORMAL HIGH (ref 65–99)

## 2015-11-20 LAB — BASIC METABOLIC PANEL
ANION GAP: 14 (ref 5–15)
BUN: 47 mg/dL — ABNORMAL HIGH (ref 6–20)
CHLORIDE: 98 mmol/L — AB (ref 101–111)
CO2: 33 mmol/L — AB (ref 22–32)
Calcium: 9.1 mg/dL (ref 8.9–10.3)
Creatinine, Ser: 2.25 mg/dL — ABNORMAL HIGH (ref 0.61–1.24)
GFR calc Af Amer: 28 mL/min — ABNORMAL LOW (ref >60)
GFR calc non Af Amer: 24 mL/min — ABNORMAL LOW (ref >60)
GLUCOSE: 145 mg/dL — AB (ref 65–99)
POTASSIUM: 3.4 mmol/L — AB (ref 3.5–5.1)
Sodium: 145 mmol/L (ref 135–145)

## 2015-11-20 LAB — CBC
HEMATOCRIT: 31.8 % — AB (ref 39.0–52.0)
HEMOGLOBIN: 9.5 g/dL — AB (ref 13.0–17.0)
MCH: 26.8 pg (ref 26.0–34.0)
MCHC: 29.9 g/dL — ABNORMAL LOW (ref 30.0–36.0)
MCV: 89.8 fL (ref 78.0–100.0)
Platelets: 99 10*3/uL — ABNORMAL LOW (ref 150–400)
RBC: 3.54 MIL/uL — AB (ref 4.22–5.81)
RDW: 19.5 % — ABNORMAL HIGH (ref 11.5–15.5)
WBC: 24 10*3/uL — AB (ref 4.0–10.5)

## 2015-11-20 LAB — HEPARIN LEVEL (UNFRACTIONATED): Heparin Unfractionated: 0.35 IU/mL (ref 0.30–0.70)

## 2015-11-20 MED ORDER — SODIUM CHLORIDE 0.9% FLUSH
10.0000 mL | INTRAVENOUS | Status: DC | PRN
  Administered 2015-11-21 – 2015-11-24 (×5): 10 mL
  Administered 2015-11-24 – 2015-11-25 (×4): 20 mL
  Administered 2015-11-26: 10 mL
  Administered 2015-11-26: 20 mL
  Administered 2015-11-26 – 2015-11-28 (×5): 10 mL
  Administered 2015-11-29 (×2): 20 mL
  Administered 2015-11-29: 10 mL
  Administered 2015-11-30 – 2015-12-01 (×3): 20 mL
  Filled 2015-11-20 (×22): qty 40

## 2015-11-20 MED ORDER — CHLORHEXIDINE GLUCONATE 0.12 % MT SOLN
OROMUCOSAL | Status: AC
  Administered 2015-11-20: 15 mL
  Filled 2015-11-20: qty 15

## 2015-11-20 MED ORDER — DEXTROSE-NACL 5-0.9 % IV SOLN
INTRAVENOUS | Status: DC
  Administered 2015-11-20 – 2015-11-22 (×4): via INTRAVENOUS
  Filled 2015-11-20: qty 1000

## 2015-11-20 NOTE — Progress Notes (Signed)
Peripherally Inserted Central Catheter/Midline Placement  The IV Nurse has discussed with the patient and/or persons authorized to consent for the patient, the purpose of this procedure and the potential benefits and risks involved with this procedure.  The benefits include less needle sticks, lab draws from the catheter and patient may be discharged home with the catheter.  Risks include, but not limited to, infection, bleeding, blood clot (thrombus formation), and puncture of an artery; nerve damage and irregular heat beat.  Alternatives to this procedure were also discussed.  PICC/Midline Placement Documentation      Sister siigned consent  at bedside  Synthia Innocent 11/20/2015, 10:51 AM

## 2015-11-20 NOTE — Progress Notes (Signed)
[facility] PROGRESS NOTE  Zepplin Misak Kernen [id number] DOB: [birth year] DOA: 11/10/2015 PCP: Odette Fraction, MD  HPI/Brief narrative Please see admission H&P from 11/10/2015. Briefly the patient is an 82 year old male with a history of COPD, CAD status post CABG, A. fib, dementia who was initially admitted with acute proxy make rest 3 failure secondary to influenza a. During the course, the patient was noted to have worsening respiratory distress. He later had nonsustained V. tach and later found to be unresponsive and pulseless. Patient was intubated and transferred to ICU. Patient since been successfully extubated on 11/18/2015 and care transferred to the hospitalist service  Assessment/Plan: 1. Acute hypoxemic respiratory failure 1. Successfully extubated on 3/15. Stable at present 2. Patient on scheduled IV lasix given CXR findings worrisome for pulm edema that appears improved today 3. Currently on 4L [place]. Cont to wean O2 as tolerated 2. NSVT 1. Pt continued on amiodarone 2. Seems stable at present 3. Hx afib 1. Rate controlled 2. CHADS-VASc 2, previously on xarelto. 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 Baseline Cr around 1.2 2. Cr had remained in the 1.5-1.7 range, suspect secondary to end-organ damage from shock vs sepsis 3. Cr seems slightly worse today. Gentle IVF started. Continue to monitor 5. Thrombocytopenia 1. Improving 2. Possibly related to heparin vs end-organ damage as plts noted to be 51k on day of admit 6. Flu A with sepsis present on admission 7. Viridians strep bacteremia 1. Had been on empiric vanc and rocephin. Now on rocephin alone based on sensitivities 2. Discussed with ID. Recs for 10 days of tx 8. Dementia 1. stable 9. DVT prophylaxis 1. On heparin gtt  Code Status: DNR Family Communication: Pt in room, family at bedside Disposition Plan: Uncertain at this time   Consultants:  Critical  Care  Procedures:    Antibiotics: Anti-infectives    Start     Dose/Rate Route Frequency Ordered Stop   11/16/15 1545  vancomycin (VANCOCIN) IVPB 750 mg/150 ml premix  Status:  Discontinued     750 mg 150 mL/hr over 60 Minutes Intravenous Every 24 hours 11/16/15 1543 11/18/15 1503   11/15/15 0000  vancomycin (VANCOCIN) 1,250 mg in sodium chloride 0.9 % 250 mL IVPB  Status:  Discontinued     1,250 mg 166.7 mL/hr over 90 Minutes Intravenous Every 24 hours 11/14/15 0653 11/15/15 1527   11/13/15 2345  cefTRIAXone (ROCEPHIN) 2 g in dextrose 5 % 50 mL IVPB     2 g 100 mL/hr over 30 Minutes Intravenous Daily at bedtime 11/13/15 2327     11/12/15 0900  cefTRIAXone (ROCEPHIN) 2 g in dextrose 5 % 50 mL IVPB  Status:  Discontinued     2 g 100 mL/hr over 30 Minutes Intravenous Every 24 hours 11/11/15 1323 11/11/15 1433   11/11/15 2200  oseltamivir (TAMIFLU) 6 MG/ML suspension 30 mg     30 mg Oral 2 times daily 11/11/15 1340 11/14/15 2152   11/11/15 1400  cefTRIAXone (ROCEPHIN) 1 g in dextrose 5 % 50 mL IVPB  Status:  Discontinued     1 g 100 mL/hr over 30 Minutes Intravenous  Once 11/11/15 1324 11/11/15 1433   11/11/15 1200  azithromycin (ZITHROMAX) 500 mg in dextrose 5 % 250 mL IVPB  Status:  Discontinued     500 mg 250 mL/hr over 60 Minutes Intravenous Every 24 hours 11/10/15 1318 11/11/15 1433   11/11/15 0900  cefTRIAXone (ROCEPHIN) 1 g in dextrose 5 % 50 mL  IVPB  Status:  Discontinued     1 g 100 mL/hr over 30 Minutes Intravenous Every 24 hours 11/10/15 1318 11/11/15 1323   11/11/15 0600  vancomycin (VANCOCIN) IVPB 1000 mg/200 mL premix  Status:  Discontinued     1,000 mg 200 mL/hr over 60 Minutes Intravenous Every 24 hours 11/11/15 0528 11/14/15 0653   11/10/15 1500  oseltamivir (TAMIFLU) 6 MG/ML suspension 75 mg  Status:  Discontinued     75 mg Oral 2 times daily 11/10/15 1448 11/11/15 1340   11/10/15 1130  azithromycin (ZITHROMAX) 500 mg in dextrose 5 % 250 mL IVPB     500 mg 250  mL/hr over 60 Minutes Intravenous  Once 11/10/15 1127 11/10/15 1319   11/10/15 0830  cefTRIAXone (ROCEPHIN) 1 g in dextrose 5 % 50 mL IVPB     1 g 100 mL/hr over 30 Minutes Intravenous  Once 11/10/15 0828 11/10/15 1222      HPI/Subjective: Without complaints to me this AM  Objective: Filed Vitals:   11/19/15 2218 11/20/15 0534 11/20/15 1344 11/20/15 1356  BP:  125/69 105/72   Pulse: 92 78 88   Temp:  98.1 F (36.7 C) 97.2 F (36.2 C)   TempSrc:  Oral Axillary   Resp: 20  18   Height:      Weight:  65.59 kg (144 lb 9.6 oz)    SpO2: 96% 94% 96% 86%    Intake/Output Summary (Last 24 hours) at 11/20/15 1445 Last data filed at 11/20/15 1300  Gross per 24 hour  Intake    120 ml  Output   2950 ml  Net  -2830 ml   Filed Weights   11/18/15 1858 11/19/15 0430 11/20/15 0534  Weight: 68.811 kg (151 lb 11.2 oz) 67.813 kg (149 lb 8 oz) 65.59 kg (144 lb 9.6 oz)    Exam:   General:  Awake, in nad  Cardiovascular: regular, s1, s2  Respiratory: normal resp effort, no wheezing  Abdomen: soft, nondistended  Musculoskeletal: perfused, no clubbing, no cyanosis  Data Reviewed: Basic Metabolic Panel:  Recent Labs Lab 11/13/15 2317  11/15/15 0345 11/15/15 1247 11/16/15 0930 11/17/15 0219 11/18/15 0330 11/19/15 0440 11/20/15 0557  NA 144  < > 144 144 145 146* 146* 146* 145  K 3.4*  < > 3.3* 4.3 4.3 4.2 4.0 3.8 3.4*  CL 106  < > 105 106 108 108 107 102 98*  CO2 24  < > 26 26 28 29 27  32 33*  GLUCOSE 188*  < > 138* 176* 125* 135* 144* 100* 145*  BUN 31*  < > 34* 38* 44* 52* 50* 47* 47*  CREATININE 1.47*  < > 1.73* 1.77* 1.80* 1.71* 1.56* 1.95* 2.25*  CALCIUM 8.2*  < > 8.5* 8.6* 9.1 8.9 8.9 8.9 9.1  MG 2.4  --  2.1 2.1  --   --  2.2 2.2  --   PHOS  --   --  2.5 1.9*  --   --  3.3 4.1  --   < > = values in this interval not displayed. Liver Function Tests:  Recent Labs Lab 11/17/15 0219  AST 27  ALT 30  ALKPHOS 73  BILITOT 0.6  PROT 5.6*  ALBUMIN 2.7*   No  results for input(s): LIPASE, AMYLASE in the last 168 hours. No results for input(s): AMMONIA in the last 168 hours. CBC:  Recent Labs Lab 11/16/15 0930 11/17/15 0219 11/18/15 0330 11/19/15 0440 11/20/15 0557  WBC 24.7* 21.1*  23.6* 29.1* 24.0*  HGB 9.2* 8.6* 7.8* 8.9* 9.5*  HCT 30.9* 28.8* 25.2* 30.2* 31.8*  MCV 89.8 90.3 87.8 91.0 89.8  PLT 83* 77* 89* 96* 99*   Cardiac Enzymes:  Recent Labs Lab 11/13/15 2317 11/14/15 0310 11/14/15 1051 11/15/15 1247 11/16/15 1435  TROPONINI 0.05* 6.03* 7.34* 3.82* 3.04*   BNP (last 3 results)  Recent Labs  11/10/15 1456 11/16/15 0353  BNP 1792.8* 406.2*    ProBNP (last 3 results) No results for input(s): PROBNP in the last 8760 hours.  CBG:  Recent Labs Lab 11/19/15 1632 11/19/15 2050 11/20/15 0117 11/20/15 0521 11/20/15 0812  GLUCAP 114* 84 103* 109* 128*    Recent Results (from the past 240 hour(s))  Gram stain     Status: None   Collection Time: 11/12/15  6:09 PM  Result Value Ref Range Status   Specimen Description TRACHEAL SITE  Final   Special Requests NONE  Final   Gram Stain   Final    ABUNDANT WBC PRESENT,BOTH PMN AND MONONUCLEAR FEW GRAM NEGATIVE RODS RARE GRAM POSITIVE RODS RARE GRAM POSITIVE COCCI IN PAIRS SQUAMOUS EPITHELIAL CELLS PRESENT Gram Stain Report Called to,Read Back By and Verified With: D EVERETTE RN 1942 11/12/15 A BROWNING    Report Status 11/12/2015 FINAL  Final  MRSA PCR Screening     Status: None   Collection Time: 11/13/15  9:10 AM  Result Value Ref Range Status   MRSA by PCR NEGATIVE NEGATIVE Final    Comment:        The GeneXpert MRSA Assay (FDA approved for NASAL specimens only), is one component of a comprehensive MRSA colonization surveillance program. It is not intended to diagnose MRSA infection nor to guide or monitor treatment for MRSA infections.   Culture, blood (Routine X 2) w Reflex to ID Panel     Status: None (Preliminary result)   Collection Time: 11/16/15  12:02 PM  Result Value Ref Range Status   Specimen Description BLOOD LEFT ANTECUBITAL  Final   Special Requests BOTTLES DRAWN AEROBIC AND ANAEROBIC 5CC  Final   Culture NO GROWTH 4 DAYS  Final   Report Status PENDING  Incomplete  Culture, blood (Routine X 2) w Reflex to ID Panel     Status: None (Preliminary result)   Collection Time: 11/16/15  2:21 PM  Result Value Ref Range Status   Specimen Description BLOOD LEFT ANTECUBITAL  Final   Special Requests BOTTLES DRAWN AEROBIC AND ANAEROBIC [place]  Final   Culture NO GROWTH 4 DAYS  Final   Report Status PENDING  Incomplete     Studies: Dg Chest Port 1 View  11/20/2015  CLINICAL DATA:  Status post catheter placement. EXAM: PORTABLE CHEST 1 VIEW COMPARISON:  November 17, 2015. FINDINGS: Stable cardiomegaly. Left-sided pacemaker is unchanged in position. Interval placement of right-sided PICC line with distal tip in expected position of cavoatrial junction. No pneumothorax or pleural effusion is noted. Left lung appears clear. Mild right basilar opacity is noted which is decreased compared to prior exam consistent with edema or pneumonia. IMPRESSION: Interval placement of right-sided PICC line with distal tip in expected position of cavoatrial junction. Decreased right basilar opacity is noted consistent with improving edema or pneumonia. Electronically Signed   By: Marijo Conception, M.D.   On: 11/20/2015 12:15    Scheduled Meds: . antiseptic oral rinse  7 mL Mouth Rinse QID  . arformoterol  15 mcg Nebulization BID  . budesonide (PULMICORT) nebulizer solution  0.25  mg Nebulization BID  . cefTRIAXone (ROCEPHIN)  IV  2 g Intravenous QHS  . chlorhexidine gluconate  15 mL Mouth Rinse BID  . donepezil  5 mg Oral QHS  . feeding supplement (PRO-STAT SUGAR FREE 64)  30 mL Per Tube BID  . furosemide  60 mg Intravenous BID  . insulin aspart  0-15 Units Subcutaneous 6 times per day  . omeprazole  40 mg Per Tube Daily  . simvastatin  10 mg Oral q1800  .  tamsulosin  0.4 mg Oral Daily   Continuous Infusions: . sodium chloride    . sodium chloride 10 mL/hr at 11/17/15 1900  . amiodarone 30 mg/hr (11/20/15 0957)  . dextrose 5 % and 0.9% NaCl    . heparin 1,200 Units/hr (11/18/15 1505)    Active Problems:   Acute systolic heart failure (HCC)   Sepsis due to pneumonia ([place])   Acute encephalopathy   Influenza with pneumonia   Hypotension   Protein-calorie malnutrition, severe ([place])   Cardiac arrest ([place])   Polymorphic ventricular tachycardia (HCC)   Pressure ulcer   Encounter for imaging study to confirm orogastric (OG) tube placement   Endotracheally intubated   Bacteremia due to Streptococcus   [place]oss [place], [place] [facility] Pager [phone]. If 7PM-7AM, please contact night-coverage at [email or web address], password [facility] 11/20/2015, 2:45 PM  LOS: 9 days

## 2015-11-20 NOTE — Progress Notes (Signed)
Physical Therapy Treatment Patient Details Name: [name] [name] MRN: [id number] DOB: [birth year] Today's Date: 11/20/2015    History of Present Illness presented with fever and dyspnea. r/o sepsis. +flu and CHF exacerbation PMHx-HIV, Hepatitis C, CAD, CHF, afib, dementia, COPD. Pt s/p cardiac arrest 3/10 with CPR for 8 mins requiring intubation 3/10-3/15.     PT Comments    Max assist for pivot transfer to recliner. Pt limited by 8/10 chest pain (from CPR), 3/4 dyspnea, SaO2 86% on 4L O2 with activity, 96% after several minutes rest.   Follow Up Recommendations  SNF;Supervision/Assistance - 24 hour     Equipment Recommendations  Rolling walker with 5" wheels    Recommendations for Other Services OT consult     Precautions / Restrictions Precautions Precautions: Fall Precaution Comments: Multiple falls PTA. Watch 02 sats, chest sore from CPR Restrictions Weight Bearing Restrictions: No    Mobility  Bed Mobility Overal bed mobility: Needs Assistance Bed Mobility: Rolling;Sidelying to Sit Rolling: Min assist Sidelying to sit: Mod assist       General bed mobility comments: Mod A to raise trunk  Transfers Overall transfer level: Needs assistance Equipment used: None   Sit to Stand: Mod assist;+2 safety/equipment Stand pivot transfers: Max assist;+2 safety/equipment       General transfer comment: Max A of 2 for SPT to chair with cues for technique. Pt reports chest pain (most likely from CPR). Sp02 dropped to 86% on 4L O2 [place], 3/4 dyspnea, SaO2 up to 96% on 4L after several minutes rest. Cues for pursed lip breathing.  Ambulation/Gait                 Stairs            Wheelchair Mobility    Modified Rankin (Stroke Patients Only)       Balance   Sitting-balance support: Feet supported Sitting balance-Leahy Scale: Fair Sitting balance - Comments: Pt with Mod A for sitting balance progressing to Min A due to cues for forward lean. Sat EOB ~8  minutes.     Standing balance-Leahy Scale: Poor                      Cognition Arousal/Alertness: Awake/alert Behavior During Therapy: WFL for tasks assessed/performed Overall Cognitive Status: History of cognitive impairments - at baseline (Able to find date on wall and state "March")       Memory: Decreased short-term memory (Pt not able to recall ambulating with PT in hospital prior to code blue.)              Exercises      General Comments        Pertinent Vitals/Pain Pain Score: 8  Pain Location: chest Pain Descriptors / Indicators: Sore Pain Intervention(s): Limited activity within patient's tolerance;Monitored during session;Premedicated before session    Home Living                      Prior Function            PT Goals (current goals can now be found in the care plan section) Acute Rehab PT Goals Patient Stated Goal: none stated PT Goal Formulation: With patient/family Time For Goal Achievement: 11/25/15 Potential to Achieve Goals: Fair Additional Goals Additional Goal #1: Maintain SaO2>88% on RA, or able to manipulate O2 tubing with minimal cues Progress towards PT goals: Progressing toward goals    Frequency  Min 2X/week    PT Plan  Current plan remains appropriate    Co-evaluation             End of Session Equipment Utilized During Treatment: Gait belt;Oxygen Activity Tolerance: Treatment limited secondary to medical complications (Comment) (drop in Sp02 to 79% on 3L/min 02  during session.) Patient left: in chair;with call bell/phone within reach;with chair alarm set;with family/visitor present;with nursing/sitter in room     Time: [id number] PT Time Calculation (min) (ACUTE ONLY): 26 min  Charges:  $Therapeutic Activity: 23-37 mins                    G Codes:      Philomena Doheny 11/20/2015, 2:01 PM [phone]

## 2015-11-20 NOTE — Progress Notes (Signed)
PT Cancellation Note  Patient Details Name: [name] [name] MRN: [id number] DOB: Aug 14, 1926   Cancelled Treatment:    Reason Eval/Treat Not Completed: Patient at procedure or test/unavailable (pt getting PICC. Will follow. )   Philomena Doheny 11/20/2015, 10:25 AM [phone]

## 2015-11-20 NOTE — Evaluation (Signed)
Clinical/Bedside Swallow Evaluation Patient Details  Name: [name] [name] MRN: [id number] Date of Birth: October 01, 1925  Today's Date: 11/20/2015 Time: SLP Start Time (ACUTE ONLY): 1205 SLP Stop Time (ACUTE ONLY): 1226 SLP Time Calculation (min) (ACUTE ONLY): 21 min  Past Medical History:  Past Medical History  Diagnosis Date  . CAD (coronary artery disease)   . Hypertension   . Vitamin D deficiency   . Chronic back pain   . Anemia   . ED (erectile dysfunction)   . Arthritis   . RLS (restless legs syndrome)   . GERD (gastroesophageal reflux disease)   . COPD (chronic obstructive pulmonary disease) ([place])   . Hyperlipidemia   . Atrial fibrillation ([place])   . Second degree Mobitz II AV block     s/p PPM  . Asthma 09/15/2011  . Long term current use of anticoagulant 09/15/2011  . Thrombocytopenia ([place]) 09/15/2011  . Lymphocytosis   . CHF (congestive heart failure) ([place]) 01/01/2013  . Allergy    Past Surgical History:  Past Surgical History  Procedure Laterality Date  . Coronary artery bypass graft    . Cataract extraction    . Pacemaker insertion     HPI:  82 year old male with past medical history of CAD, diastolic CHF, atrial fibrillation and COPD who presented to the emergency room on 3/7 with acute respiratory failure with hypoxia, influenza, VF arrest with CPR 10 minutes, intubated 3/10-3/16.   Assessment / Plan / Recommendation Clinical Impression  Pt presents with remarkably strong swallow after extended intubation - there is normal mastication, brisk swallow response, good coordination of swallow/respiration sequence.  No s/s of aspiration, even when taxed with large, successive boluses of thin liquid.  Rec advancing diet to regular, thin liquids; no SLP f/u needed.     Aspiration Risk  No limitations    Diet Recommendation     Medication Administration: Whole meds with liquid    Other  Recommendations Oral Care Recommendations: Oral care BID   Follow up  Recommendations  None    Frequency and Duration            Prognosis        Swallow Study   General Date of Onset: 11/10/15 HPI: 82 year old male with past medical history of CAD, diastolic CHF, atrial fibrillation and COPD who presented to the emergency room on 3/7 with acute respiratory failure with hypoxia, influenza, VF arrest with CPR 10 minutes, intubated 3/10-3/16. Type of Study: Bedside Swallow Evaluation Diet Prior to this Study: Regular;Thin liquids Temperature Spikes Noted: No Respiratory Status: Nasal cannula History of Recent Intubation: Yes Length of Intubations (days): 6 days Date extubated: 11/19/15 Behavior/Cognition: Alert;Cooperative;Pleasant mood Oral Cavity Assessment: Within Functional Limits Oral Care Completed by SLP: No Oral Cavity - Dentition: Dentures, top;Dentures, bottom Vision: Functional for self-feeding Self-Feeding Abilities: Able to feed self Patient Positioning: Upright in bed Baseline Vocal Quality: Normal Volitional Cough: Strong Volitional Swallow: Able to elicit    Oral/Motor/Sensory Function Overall Oral Motor/Sensory Function: Within functional limits   Ice Chips Ice chips: Within functional limits   Thin Liquid Thin Liquid: Within functional limits Presentation: Cup;Self Fed    Nectar Thick Nectar Thick Liquid: Not tested   Honey Thick Honey Thick Liquid: Not tested   Puree Puree: Within functional limits Presentation: Self Fed;Spoon   Solid [name] [name] L. [place], [place] CCC/SLP Pager [phone]    Solid: Within functional limits        [name] [name] 11/20/2015,12:33 PM

## 2015-11-20 NOTE — Progress Notes (Addendum)
ANTICOAGULATION CONSULT NOTE - follow-up Consult  Pharmacy Consult for heparin Indication: chest pain/ACS  Allergies  Allergen Reactions  . Amoxicillin Itching and Rash  . Penicillins Itching and Rash    Has patient had a PCN reaction causing immediate rash, facial/tongue/throat swelling, SOB or lightheadedness with hypotension: Yes Has patient had a PCN reaction causing severe rash involving mucus membranes or skin necrosis: No Has patient had a PCN reaction that required hospitalization No Has patient had a PCN reaction occurring within the last 10 years: No If all of the above answers are "NO", then may proceed with Cephalosporin use.     Patient Measurements: Height: 5\' 11"  (180.3 cm) Weight: 144 lb 9.6 oz (65.59 kg) (scale a) IBW/kg (Calculated) : 75.3 Heparin Dosing Weight: 71 kg  Vital Signs: Temp: 97.2 F (36.2 C) (03/17 1344) Temp Source: Axillary (03/17 1344) BP: 105/72 mmHg (03/17 1344) Pulse Rate: 88 (03/17 1344)  Labs:  Recent Labs  11/18/15 0330 11/18/15 0818 11/19/15 0440 11/20/15 0557  HGB 7.8*  --  8.9* 9.5*  HCT 25.2*  --  30.2* 31.8*  PLT 89*  --  96* 99*  APTT  --  69* 71*  --   HEPARINUNFRC  --  0.31 0.35 0.35  CREATININE 1.56*  --  1.95* 2.25*    Estimated Creatinine Clearance: 21.1 mL/min (by C-G formula based on Cr of 2.25).  Assessment: 82 yo male with VF arrest on 3/10 > ROSC. Now with elevated troponin. Pt is on Xarelto PTA for history of AFib (last dose was 3/10 @ 1800) but was transitioned to a heparin drip. Heparin level remains therapeutic at 0.35.   Goal of Therapy:  APTT 66-102 sec Heparin level 0.3-0.7 units/ml Monitor platelets by anticoagulation protocol: Yes   Plan:  Continue heparin gtt 1200 units/hr Daily heparin level and CBC Will follow plans for oral [facility]  Hildred Laser, Pharm D 11/20/2015 1:54 PM

## 2015-11-21 LAB — GLUCOSE, CAPILLARY
GLUCOSE-CAPILLARY: 121 mg/dL — AB (ref 65–99)
GLUCOSE-CAPILLARY: 146 mg/dL — AB (ref 65–99)
GLUCOSE-CAPILLARY: 153 mg/dL — AB (ref 65–99)
Glucose-Capillary: 115 mg/dL — ABNORMAL HIGH (ref 65–99)
Glucose-Capillary: 118 mg/dL — ABNORMAL HIGH (ref 65–99)
Glucose-Capillary: 143 mg/dL — ABNORMAL HIGH (ref 65–99)
Glucose-Capillary: 146 mg/dL — ABNORMAL HIGH (ref 65–99)

## 2015-11-21 LAB — BASIC METABOLIC PANEL
Anion gap: 13 (ref 5–15)
BUN: 46 mg/dL — AB (ref 6–20)
CHLORIDE: 97 mmol/L — AB (ref 101–111)
CO2: 33 mmol/L — ABNORMAL HIGH (ref 22–32)
Calcium: 8.5 mg/dL — ABNORMAL LOW (ref 8.9–10.3)
Creatinine, Ser: 2.13 mg/dL — ABNORMAL HIGH (ref 0.61–1.24)
GFR calc Af Amer: 30 mL/min — ABNORMAL LOW (ref >60)
GFR calc non Af Amer: 26 mL/min — ABNORMAL LOW (ref >60)
Glucose, Bld: 166 mg/dL — ABNORMAL HIGH (ref 65–99)
POTASSIUM: 3.1 mmol/L — AB (ref 3.5–5.1)
SODIUM: 143 mmol/L (ref 135–145)

## 2015-11-21 LAB — CBC
HEMATOCRIT: 28 % — AB (ref 39.0–52.0)
HEMOGLOBIN: 8.5 g/dL — AB (ref 13.0–17.0)
MCH: 27.2 pg (ref 26.0–34.0)
MCHC: 30.4 g/dL (ref 30.0–36.0)
MCV: 89.5 fL (ref 78.0–100.0)
Platelets: 93 10*3/uL — ABNORMAL LOW (ref 150–400)
RBC: 3.13 MIL/uL — AB (ref 4.22–5.81)
RDW: 19.4 % — ABNORMAL HIGH (ref 11.5–15.5)
WBC: 20.7 10*3/uL — ABNORMAL HIGH (ref 4.0–10.5)

## 2015-11-21 LAB — CULTURE, BLOOD (ROUTINE X 2)
Culture: NO GROWTH
Culture: NO GROWTH

## 2015-11-21 LAB — HEPARIN LEVEL (UNFRACTIONATED): Heparin Unfractionated: 0.32 IU/mL (ref 0.30–0.70)

## 2015-11-21 MED ORDER — POTASSIUM CHLORIDE CRYS ER 20 MEQ PO TBCR
40.0000 meq | EXTENDED_RELEASE_TABLET | Freq: Once | ORAL | Status: AC
  Administered 2015-11-21: 40 meq via ORAL
  Filled 2015-11-21: qty 2

## 2015-11-21 NOTE — Progress Notes (Signed)
[facility] PROGRESS NOTE  [name] [name] [id number] DOB: 1925/11/29 DOA: 11/10/2015 PCP: Odette Fraction, MD  HPI/Brief narrative Please see admission H&P from 11/10/2015. Briefly the patient is an 82 year old male with a history of COPD, CAD status post CABG, A. fib, dementia who was initially admitted with acute proxy make rest 3 failure secondary to influenza a. During the course, the patient was noted to have worsening respiratory distress. He later had nonsustained V. tach and later found to be unresponsive and pulseless. Patient was intubated and transferred to ICU. Patient since been successfully extubated on 11/18/2015 and care transferred to the hospitalist service  Assessment/Plan: 1. Acute hypoxemic respiratory failure 1. Successfully extubated on 3/15. Stable at present 2. Patient on scheduled IV lasix given CXR findings worrisome for pulm edema that appears improved today 3. Currently on 4L [place]. Cont to wean O2 as tolerated 2. NSVT 1. Pt continued on amiodarone 2. Seems stable at present 3. Hx afib 1. Rate controlled 2. CHADS-VASc 2, previously on xarelto. 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 Baseline Cr around 1.2 2. Cr had remained in the 1.5-1.7 range, suspect secondary to end-organ damage from shock vs sepsis 3. Cr seems to be improving with gentle IVF. Continue to monitor 5. Thrombocytopenia 1. Improving 2. Possibly related to heparin vs end-organ damage as plts noted to be 51k on day of admit 6. Flu A with sepsis present on admission 7. Viridians strep bacteremia 1. Had been on empiric vanc and rocephin. Now on rocephin alone based on sensitivities 2. Discussed with ID. Recs for 10 days of tx 8. Dementia 1. stable 9. DVT prophylaxis 1. On heparin gtt  Code Status: DNR Family Communication: Pt in room, family at bedside Disposition Plan: Uncertain at this time   Consultants:  Critical  Care  Procedures:    Antibiotics: Anti-infectives    Start     Dose/Rate Route Frequency Ordered Stop   11/16/15 1545  vancomycin (VANCOCIN) IVPB 750 mg/150 ml premix  Status:  Discontinued     750 mg 150 mL/hr over 60 Minutes Intravenous Every 24 hours 11/16/15 1543 11/18/15 1503   11/15/15 0000  vancomycin (VANCOCIN) 1,250 mg in sodium chloride 0.9 % 250 mL IVPB  Status:  Discontinued     1,250 mg 166.7 mL/hr over 90 Minutes Intravenous Every 24 hours 11/14/15 0653 11/15/15 1527   11/13/15 2345  cefTRIAXone (ROCEPHIN) 2 g in dextrose 5 % 50 mL IVPB     2 g 100 mL/hr over 30 Minutes Intravenous Daily at bedtime 11/13/15 2327     11/12/15 0900  cefTRIAXone (ROCEPHIN) 2 g in dextrose 5 % 50 mL IVPB  Status:  Discontinued     2 g 100 mL/hr over 30 Minutes Intravenous Every 24 hours 11/11/15 1323 11/11/15 1433   11/11/15 2200  oseltamivir (TAMIFLU) 6 MG/ML suspension 30 mg     30 mg Oral 2 times daily 11/11/15 1340 11/14/15 2152   11/11/15 1400  cefTRIAXone (ROCEPHIN) 1 g in dextrose 5 % 50 mL IVPB  Status:  Discontinued     1 g 100 mL/hr over 30 Minutes Intravenous  Once 11/11/15 1324 11/11/15 1433   11/11/15 1200  azithromycin (ZITHROMAX) 500 mg in dextrose 5 % 250 mL IVPB  Status:  Discontinued     500 mg 250 mL/hr over 60 Minutes Intravenous Every 24 hours 11/10/15 1318 11/11/15 1433   11/11/15 0900  cefTRIAXone (ROCEPHIN) 1 g in dextrose 5 % 50 mL  IVPB  Status:  Discontinued     1 g 100 mL/hr over 30 Minutes Intravenous Every 24 hours 11/10/15 1318 11/11/15 1323   11/11/15 0600  vancomycin (VANCOCIN) IVPB 1000 mg/200 mL premix  Status:  Discontinued     1,000 mg 200 mL/hr over 60 Minutes Intravenous Every 24 hours 11/11/15 0528 11/14/15 0653   11/10/15 1500  oseltamivir (TAMIFLU) 6 MG/ML suspension 75 mg  Status:  Discontinued     75 mg Oral 2 times daily 11/10/15 1448 11/11/15 1340   11/10/15 1130  azithromycin (ZITHROMAX) 500 mg in dextrose 5 % 250 mL IVPB     500 mg 250  mL/hr over 60 Minutes Intravenous  Once 11/10/15 1127 11/10/15 1319   11/10/15 0830  cefTRIAXone (ROCEPHIN) 1 g in dextrose 5 % 50 mL IVPB     1 g 100 mL/hr over 30 Minutes Intravenous  Once 11/10/15 0828 11/10/15 1222      HPI/Subjective: Reports feeling better today  Objective: Filed Vitals:   11/20/15 2056 11/21/15 0446 11/21/15 0940 11/21/15 1133  BP:  110/63  107/45  Pulse:  78  58  Temp:  97.6 F (36.4 C)  97.2 F (36.2 C)  TempSrc:  Oral  Oral  Resp:  20  18  Height:      Weight:  66.724 kg (147 lb 1.6 oz)    SpO2: 95% 94% 95% 95%    Intake/Output Summary (Last 24 hours) at 11/21/15 1619 Last data filed at 11/21/15 1604  Gross per 24 hour  Intake 2146.8 ml  Output   1150 ml  Net  996.8 ml   Filed Weights   11/19/15 0430 11/20/15 0534 11/21/15 0446  Weight: 67.813 kg (149 lb 8 oz) 65.59 kg (144 lb 9.6 oz) 66.724 kg (147 lb 1.6 oz)    Exam:   General:  Awake, in nad, laying in bed  Cardiovascular: regular, s1, s2  Respiratory: normal resp effort, no wheezing  Abdomen: soft, nondistended  Musculoskeletal: perfused, no clubbing, no cyanosis  Data Reviewed: Basic Metabolic Panel:  Recent Labs Lab 11/15/15 0345 11/15/15 1247  11/17/15 0219 11/18/15 0330 11/19/15 0440 11/20/15 0557 11/21/15 0517  NA 144 144  < > 146* 146* 146* 145 143  K 3.3* 4.3  < > 4.2 4.0 3.8 3.4* 3.1*  CL 105 106  < > 108 107 102 98* 97*  CO2 26 26  < > 29 27 32 33* 33*  GLUCOSE 138* 176*  < > 135* 144* 100* 145* 166*  BUN 34* 38*  < > 52* 50* 47* 47* 46*  CREATININE 1.73* 1.77*  < > 1.71* 1.56* 1.95* 2.25* 2.13*  CALCIUM 8.5* 8.6*  < > 8.9 8.9 8.9 9.1 8.5*  MG 2.1 2.1  --   --  2.2 2.2  --   --   PHOS 2.5 1.9*  --   --  3.3 4.1  --   --   < > = values in this interval not displayed. Liver Function Tests:  Recent Labs Lab 11/17/15 0219  AST 27  ALT 30  ALKPHOS 73  BILITOT 0.6  PROT 5.6*  ALBUMIN 2.7*   No results for input(s): LIPASE, AMYLASE in the last 168  hours. No results for input(s): AMMONIA in the last 168 hours. CBC:  Recent Labs Lab 11/17/15 0219 11/18/15 0330 11/19/15 0440 11/20/15 0557 11/21/15 0517  WBC 21.1* 23.6* 29.1* 24.0* 20.7*  HGB 8.6* 7.8* 8.9* 9.5* 8.5*  HCT 28.8* 25.2* 30.2* 31.8*  28.0*  MCV 90.3 87.8 91.0 89.8 89.5  PLT 77* 89* 96* 99* 93*   Cardiac Enzymes:  Recent Labs Lab 11/15/15 1247 11/16/15 1435  TROPONINI 3.82* 3.04*   BNP (last 3 results)  Recent Labs  11/10/15 1456 11/16/15 0353  BNP 1792.8* 406.2*    ProBNP (last 3 results) No results for input(s): PROBNP in the last 8760 hours.  CBG:  Recent Labs Lab 11/21/15 0026 11/21/15 0432 11/21/15 0831 11/21/15 1130 11/21/15 1601  GLUCAP 146* 121* 143* 153* 115*    Recent Results (from the past 240 hour(s))  Gram stain     Status: None   Collection Time: 11/12/15  6:09 PM  Result Value Ref Range Status   Specimen Description TRACHEAL SITE  Final   Special Requests NONE  Final   Gram Stain   Final    ABUNDANT WBC PRESENT,BOTH PMN AND MONONUCLEAR FEW GRAM NEGATIVE RODS RARE GRAM POSITIVE RODS RARE GRAM POSITIVE COCCI IN PAIRS SQUAMOUS EPITHELIAL CELLS PRESENT Gram Stain Report Called to,Read Back By and Verified With: D EVERETTE RN 1944 11/12/15 A BROWNING    Report Status 11/12/2015 FINAL  Final  MRSA PCR Screening     Status: None   Collection Time: 11/13/15  9:10 AM  Result Value Ref Range Status   MRSA by PCR NEGATIVE NEGATIVE Final    Comment:        The GeneXpert MRSA Assay (FDA approved for NASAL specimens only), is one component of a comprehensive MRSA colonization surveillance program. It is not intended to diagnose MRSA infection nor to guide or monitor treatment for MRSA infections.   Culture, blood (Routine X 2) w Reflex to ID Panel     Status: None   Collection Time: 11/16/15 12:02 PM  Result Value Ref Range Status   Specimen Description BLOOD LEFT ANTECUBITAL  Final   Special Requests BOTTLES DRAWN  AEROBIC AND ANAEROBIC 5CC  Final   Culture NO GROWTH 5 DAYS  Final   Report Status 11/21/2015 FINAL  Final  Culture, blood (Routine X 2) w Reflex to ID Panel     Status: None   Collection Time: 11/16/15  2:21 PM  Result Value Ref Range Status   Specimen Description BLOOD LEFT ANTECUBITAL  Final   Special Requests BOTTLES DRAWN AEROBIC AND ANAEROBIC [facility]  Final   Culture NO GROWTH 5 DAYS  Final   Report Status 11/21/2015 FINAL  Final     Studies: Dg Chest Port 1 View  11/20/2015  CLINICAL DATA:  Status post catheter placement. EXAM: PORTABLE CHEST 1 VIEW COMPARISON:  November 17, 2015. FINDINGS: Stable cardiomegaly. Left-sided pacemaker is unchanged in position. Interval placement of right-sided PICC line with distal tip in expected position of cavoatrial junction. No pneumothorax or pleural effusion is noted. Left lung appears clear. Mild right basilar opacity is noted which is decreased compared to prior exam consistent with edema or pneumonia. IMPRESSION: Interval placement of right-sided PICC line with distal tip in expected position of cavoatrial junction. Decreased right basilar opacity is noted consistent with improving edema or pneumonia. Electronically Signed   By: Marijo Conception, M.D.   On: 11/20/2015 12:15    Scheduled Meds: . antiseptic oral rinse  7 mL Mouth Rinse QID  . arformoterol  15 mcg Nebulization BID  . budesonide (PULMICORT) nebulizer solution  0.25 mg Nebulization BID  . cefTRIAXone (ROCEPHIN)  IV  2 g Intravenous QHS  . chlorhexidine gluconate  15 mL Mouth Rinse BID  . donepezil  5 mg Oral QHS  . feeding supplement (PRO-STAT SUGAR FREE 64)  30 mL Per Tube BID  . furosemide  60 mg Intravenous BID  . insulin aspart  0-15 Units Subcutaneous 6 times per day  . omeprazole  40 mg Per Tube Daily  . simvastatin  10 mg Oral q1800  . tamsulosin  0.4 mg Oral Daily   Continuous Infusions: . sodium chloride    . sodium chloride 10 mL/hr at 11/17/15 1900  . amiodarone 30 mg/hr  (11/21/15 1500)  . dextrose 5 % and 0.9% NaCl 75 mL/hr at 11/21/15 1500  . heparin 1,200 Units/hr (11/21/15 1500)    Active Problems:   Acute systolic heart failure (HCC)   Sepsis due to pneumonia ([place])   Acute encephalopathy   Influenza with pneumonia   Hypotension   Protein-calorie malnutrition, severe ([place])   Cardiac arrest ([place])   Polymorphic ventricular tachycardia (HCC)   Pressure ulcer   Encounter for imaging study to confirm orogastric (OG) tube placement   Endotracheally intubated   Bacteremia due to Streptococcus   CH[facility], [place] [facility] Pager [phone]. If 7PM-7AM, please contact night-coverage at [email or web address], password [facility] 11/21/2015, 4:19 PM  LOS: 10 days

## 2015-11-21 NOTE — Progress Notes (Signed)
ANTICOAGULATION CONSULT NOTE - follow-up Consult  Pharmacy Consult for heparin Indication: chest pain/ACS/Afib  Allergies  Allergen Reactions  . Amoxicillin Itching and Rash  . Penicillins Itching and Rash    Has patient had a PCN reaction causing immediate rash, facial/tongue/throat swelling, SOB or lightheadedness with hypotension: Yes Has patient had a PCN reaction causing severe rash involving mucus membranes or skin necrosis: No Has patient had a PCN reaction that required hospitalization No Has patient had a PCN reaction occurring within the last 10 years: No If all of the above answers are "NO", then may proceed with Cephalosporin use.     Patient Measurements: Height: 5\' 11"  (180.3 cm) Weight: 147 lb 1.6 oz (66.724 kg) (a scale) IBW/kg (Calculated) : 75.3 Heparin Dosing Weight: 71 kg  Vital Signs: Temp: 97.6 F (36.4 C) (03/18 0446) Temp Source: Oral (03/18 0446) BP: 110/63 mmHg (03/18 0446) Pulse Rate: 78 (03/18 0446)  Labs:  Recent Labs  11/19/15 0440 11/20/15 0557 11/21/15 0517  HGB 8.9* 9.5* 8.5*  HCT 30.2* 31.8* 28.0*  PLT 96* 99* 93*  APTT 71*  --   --   HEPARINUNFRC 0.35 0.35 0.32  CREATININE 1.95* 2.25* 2.13*    Estimated Creatinine Clearance: 22.6 mL/min (by C-G formula based on Cr of 2.13).  Assessment: 82 yo male with VF arrest on 3/10 > ROSC. Now with elevated troponin. Pt is on Xarelto PTA for history of AFib (last dose was 3/10 @ 1800) but was transitioned to a heparin drip.   HL therapeutic at 0.32, Hgb 8.5, Plts 93     Goal of Therapy:  APTT 66-102 sec Heparin level 0.3-0.7 units/ml Monitor platelets by anticoagulation protocol: Yes   Plan:  Continue heparin gtt 1200 units/hr Daily heparin level and CBC Will follow plans for oral [facility]  Bennye Alm, PharmD Pharmacy Resident [phone]

## 2015-11-22 LAB — BASIC METABOLIC PANEL
Anion gap: 7 (ref 5–15)
BUN: 41 mg/dL — ABNORMAL HIGH (ref 6–20)
CO2: 34 mmol/L — ABNORMAL HIGH (ref 22–32)
Calcium: 8.3 mg/dL — ABNORMAL LOW (ref 8.9–10.3)
Chloride: 103 mmol/L (ref 101–111)
Creatinine, Ser: 2.01 mg/dL — ABNORMAL HIGH (ref 0.61–1.24)
GFR calc Af Amer: 32 mL/min — ABNORMAL LOW (ref >60)
GFR calc non Af Amer: 28 mL/min — ABNORMAL LOW (ref >60)
Glucose, Bld: 109 mg/dL — ABNORMAL HIGH (ref 65–99)
Potassium: 3.3 mmol/L — ABNORMAL LOW (ref 3.5–5.1)
Sodium: 144 mmol/L (ref 135–145)

## 2015-11-22 LAB — GLUCOSE, CAPILLARY
GLUCOSE-CAPILLARY: 108 mg/dL — AB (ref 65–99)
Glucose-Capillary: 116 mg/dL — ABNORMAL HIGH (ref 65–99)
Glucose-Capillary: 158 mg/dL — ABNORMAL HIGH (ref 65–99)
Glucose-Capillary: 141 mg/dL — ABNORMAL HIGH (ref 65–99)
Glucose-Capillary: 192 mg/dL — ABNORMAL HIGH (ref 65–99)

## 2015-11-22 LAB — CBC
HCT: 26.7 % — ABNORMAL LOW (ref 39.0–52.0)
Hemoglobin: 8 g/dL — ABNORMAL LOW (ref 13.0–17.0)
MCH: 26.9 pg (ref 26.0–34.0)
MCHC: 30 g/dL (ref 30.0–36.0)
MCV: 89.9 fL (ref 78.0–100.0)
Platelets: 89 10*3/uL — ABNORMAL LOW (ref 150–400)
RBC: 2.97 MIL/uL — ABNORMAL LOW (ref 4.22–5.81)
RDW: 19.4 % — ABNORMAL HIGH (ref 11.5–15.5)
WBC: 19.8 10*3/uL — ABNORMAL HIGH (ref 4.0–10.5)

## 2015-11-22 LAB — HEPARIN LEVEL (UNFRACTIONATED)
Heparin Unfractionated: 0.21 IU/mL — ABNORMAL LOW (ref 0.30–0.70)
Heparin Unfractionated: 0.27 IU/mL — ABNORMAL LOW (ref 0.30–0.70)

## 2015-11-22 MED ORDER — OXYCODONE-ACETAMINOPHEN 5-325 MG PO TABS
1.0000 | ORAL_TABLET | ORAL | Status: DC | PRN
  Administered 2015-11-22 – 2015-11-30 (×13): 2 via ORAL
  Filled 2015-11-22 (×14): qty 2

## 2015-11-22 MED ORDER — FUROSEMIDE 10 MG/ML IJ SOLN
40.0000 mg | Freq: Every day | INTRAMUSCULAR | Status: DC
  Administered 2015-11-23 – 2015-11-26 (×4): 40 mg via INTRAVENOUS
  Filled 2015-11-22 (×4): qty 4

## 2015-11-22 MED ORDER — FUROSEMIDE 40 MG PO TABS: 40.0000 mg | ORAL_TABLET | Freq: Every day | ORAL | Status: DC

## 2015-11-22 MED ORDER — POTASSIUM CHLORIDE CRYS ER 20 MEQ PO TBCR
60.0000 meq | EXTENDED_RELEASE_TABLET | Freq: Once | ORAL | Status: AC
  Administered 2015-11-22: 60 meq via ORAL
  Filled 2015-11-22: qty 3

## 2015-11-22 NOTE — Progress Notes (Signed)
ANTICOAGULATION CONSULT NOTE - follow-up Consult  Pharmacy Consult for heparin Indication: chest pain/ACS/Afib  Allergies  Allergen Reactions  . Amoxicillin Itching and Rash  . Penicillins Itching and Rash    Has patient had a PCN reaction causing immediate rash, facial/tongue/throat swelling, SOB or lightheadedness with hypotension: Yes Has patient had a PCN reaction causing severe rash involving mucus membranes or skin necrosis: No Has patient had a PCN reaction that required hospitalization No Has patient had a PCN reaction occurring within the last 10 years: No If all of the above answers are "NO", then may proceed with Cephalosporin use.     Patient Measurements: Height: 5\' 11"  (180.3 cm) Weight: 151 lb 9.6 oz (68.765 kg) (bedscale ) IBW/kg (Calculated) : 75.3 Heparin Dosing Weight: 71 kg  Vital Signs: Temp: 97.5 F (36.4 C) (03/19 1231) Temp Source: Oral (03/19 1231) BP: 128/41 mmHg (03/19 1231) Pulse Rate: 82 (03/19 1231)  Labs:  Recent Labs  11/20/15 0557 11/21/15 0517 11/22/15 0447 11/22/15 1815  HGB 9.5* 8.5* 8.0*  --   HCT 31.8* 28.0* 26.7*  --   PLT 99* 93* 89*  --   HEPARINUNFRC 0.35 0.32 0.21* 0.27*  CREATININE 2.25* 2.13* 2.01*  --     Estimated Creatinine Clearance: 24.7 mL/min (by C-G formula based on Cr of 2.01).  Assessment: 82 yo male with VF arrest on 3/10 > ROSC. Now with elevated troponin. Pt is on Xarelto PTA for history of AFib (last dose was 3/10 @ 1800) but was transitioned to a heparin drip.   HL remains subtherapeutic this evening at 0.27 despite a rate increase earlier today. No issues noted. Hgb 8, Plts 89-No s/sx bleeding.  Goal of Therapy:  Heparin level 0.3-0.7 units/ml Monitor platelets by anticoagulation protocol: Yes   Plan:  Increase Heparin gtt to 1500 units/hr Daily heparin level and CBC Will follow plans for oral AC  Ludie Pavlik D. Melrose Kearse, PharmD, BCPS Clinical Pharmacist Pager: [phone] 11/22/2015 7:14 PM

## 2015-11-22 NOTE — Progress Notes (Signed)
[facility] PROGRESS NOTE  [name] [name] [id number] DOB: [birth year] DOA: 11/10/2015 PCP: Odette Fraction, MD  HPI/Brief narrative Please see admission H&P from 11/10/2015. Briefly the patient is an 82 year old male with a history of COPD, CAD status post CABG, A. fib, dementia who was initially admitted with acute proxy make rest 3 failure secondary to influenza a. During the course, the patient was noted to have worsening respiratory distress. He later had nonsustained V. tach and later found to be unresponsive and pulseless. Patient was intubated and transferred to ICU. Patient since been successfully extubated on 11/18/2015 and care transferred to the hospitalist service  Assessment/Plan: 1. Acute hypoxemic respiratory failure 1. Successfully extubated on 3/15. Stable at present 2. Patient on scheduled IV lasix given CXR findings worrisome for pulm edema that appears improved on follow up CXR 3. Currently down to 3L [place]. Cont to wean O2 as tolerated 2. NSVT 1. Pt continued on amiodarone 2. Seems stable at present 3. Hx afib 1. Rate controlled 2. CHADS-VASc 2, previously on xarelto. 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 Baseline Cr around 1.2 2. Cr had remained in the 1.5-1.7 range, suspect secondary to end-organ damage from shock vs sepsis 3. Cr seems to be improving with gentle IVF. Continue to monitor 5. Thrombocytopenia 1. Improving 2. Possibly related to heparin vs end-organ damage as plts noted to be 51k on day of admit 6. Flu A with sepsis present on admission 7. Viridians strep bacteremia 1. Had been on empiric vanc and rocephin. Now on rocephin alone based on sensitivities 2. Discussed with ID. Recs for 10 days of tx 3. WBC improving. Afebrile 8. Dementia 1. stable 9. DVT prophylaxis 1. On heparin gtt  Code Status: DNR Family Communication: Pt in room, family at bedside Disposition Plan: Uncertain at this time, possible in  33-48hrs   Consultants:  Critical Care  Procedures:    Antibiotics: Anti-infectives    Start     Dose/Rate Route Frequency Ordered Stop   11/16/15 1545  vancomycin (VANCOCIN) IVPB 750 mg/150 ml premix  Status:  Discontinued     750 mg 150 mL/hr over 60 Minutes Intravenous Every 24 hours 11/16/15 1543 11/18/15 1503   11/15/15 0000  vancomycin (VANCOCIN) 1,250 mg in sodium chloride 0.9 % 250 mL IVPB  Status:  Discontinued     1,250 mg 166.7 mL/hr over 90 Minutes Intravenous Every 24 hours 11/14/15 0653 11/15/15 1527   11/13/15 2345  cefTRIAXone (ROCEPHIN) 2 g in dextrose 5 % 50 mL IVPB     2 g 100 mL/hr over 30 Minutes Intravenous Daily at bedtime 11/13/15 2327     11/12/15 0900  cefTRIAXone (ROCEPHIN) 2 g in dextrose 5 % 50 mL IVPB  Status:  Discontinued     2 g 100 mL/hr over 30 Minutes Intravenous Every 24 hours 11/11/15 1323 11/11/15 1433   11/11/15 2200  oseltamivir (TAMIFLU) 6 MG/ML suspension 30 mg     30 mg Oral 2 times daily 11/11/15 1340 11/14/15 2152   11/11/15 1400  cefTRIAXone (ROCEPHIN) 1 g in dextrose 5 % 50 mL IVPB  Status:  Discontinued     1 g 100 mL/hr over 30 Minutes Intravenous  Once 11/11/15 1324 11/11/15 1433   11/11/15 1200  azithromycin (ZITHROMAX) 500 mg in dextrose 5 % 250 mL IVPB  Status:  Discontinued     500 mg 250 mL/hr over 60 Minutes Intravenous Every 24 hours 11/10/15 1318 11/11/15 1433   11/11/15 0900  cefTRIAXone (ROCEPHIN) 1 g in dextrose 5 % 50 mL IVPB  Status:  Discontinued     1 g 100 mL/hr over 30 Minutes Intravenous Every 24 hours 11/10/15 1318 11/11/15 1323   11/11/15 0600  vancomycin (VANCOCIN) IVPB 1000 mg/200 mL premix  Status:  Discontinued     1,000 mg 200 mL/hr over 60 Minutes Intravenous Every 24 hours 11/11/15 0528 11/14/15 0653   11/10/15 1500  oseltamivir (TAMIFLU) 6 MG/ML suspension 75 mg  Status:  Discontinued     75 mg Oral 2 times daily 11/10/15 1448 11/11/15 1340   11/10/15 1130  azithromycin (ZITHROMAX) 500 mg in  dextrose 5 % 250 mL IVPB     500 mg 250 mL/hr over 60 Minutes Intravenous  Once 11/10/15 1127 11/10/15 1319   11/10/15 0830  cefTRIAXone (ROCEPHIN) 1 g in dextrose 5 % 50 mL IVPB     1 g 100 mL/hr over 30 Minutes Intravenous  Once 11/10/15 0828 11/10/15 1222      HPI/Subjective: States feeling better today and asking about being discharged  Objective: Filed Vitals:   11/22/15 1002 11/22/15 1004 11/22/15 1136 11/22/15 1231  BP:   126/65 128/41  Pulse:   71 82  Temp:    97.5 F (36.4 C)  TempSrc:    Oral  Resp:    18  Height:      Weight:      SpO2: 93% 93% 99% 98%    Intake/Output Summary (Last 24 hours) at 11/22/15 1444 Last data filed at 11/22/15 0900  Gross per 24 hour  Intake   2668 ml  Output   1825 ml  Net    843 ml   Filed Weights   11/20/15 0534 11/21/15 0446 11/22/15 0600  Weight: 65.59 kg (144 lb 9.6 oz) 66.724 kg (147 lb 1.6 oz) 68.765 kg (151 lb 9.6 oz)    Exam:   General:  Awake, in nad, laying in bed  Cardiovascular: regular, s1, s2  Respiratory: normal resp effort, no wheezing  Abdomen: soft, nondistended, pos BS  Musculoskeletal: perfused, no cyanosis  Data Reviewed: Basic Metabolic Panel:  Recent Labs Lab 11/18/15 0330 11/19/15 0440 11/20/15 0557 11/21/15 0517 11/22/15 0447  NA 146* 146* 145 143 144  K 4.0 3.8 3.4* 3.1* 3.3*  CL 107 102 98* 97* 103  CO2 27 32 33* 33* 34*  GLUCOSE 144* 100* 145* 166* 109*  BUN 50* 47* 47* 46* 41*  CREATININE 1.56* 1.95* 2.25* 2.13* 2.01*  CALCIUM 8.9 8.9 9.1 8.5* 8.3*  MG 2.2 2.2  --   --   --   PHOS 3.3 4.1  --   --   --    Liver Function Tests:  Recent Labs Lab 11/17/15 0219  AST 27  ALT 30  ALKPHOS 73  BILITOT 0.6  PROT 5.6*  ALBUMIN 2.7*   No results for input(s): LIPASE, AMYLASE in the last 168 hours. No results for input(s): AMMONIA in the last 168 hours. CBC:  Recent Labs Lab 11/18/15 0330 11/19/15 0440 11/20/15 0557 11/21/15 0517 11/22/15 0447  WBC 23.6* 29.1* 24.0*  20.7* 19.8*  HGB 7.8* 8.9* 9.5* 8.5* 8.0*  HCT 25.2* 30.2* 31.8* 28.0* 26.7*  MCV 87.8 91.0 89.8 89.5 89.9  PLT 89* 96* 99* 93* 89*   Cardiac Enzymes:  Recent Labs Lab 11/16/15 1435  TROPONINI 3.04*   BNP (last 3 results)  Recent Labs  11/10/15 1456 11/16/15 0353  BNP 1792.8* 406.2*    ProBNP (last 3  results) No results for input(s): PROBNP in the last 8760 hours.  CBG:  Recent Labs Lab 11/21/15 2032 11/21/15 2358 11/22/15 0554 11/22/15 0817 11/22/15 1139  GLUCAP 118* 146* 108* 116* 158*    Recent Results (from the past 240 hour(s))  Gram stain     Status: None   Collection Time: 11/12/15  6:09 PM  Result Value Ref Range Status   Specimen Description TRACHEAL SITE  Final   Special Requests NONE  Final   Gram Stain   Final    ABUNDANT WBC PRESENT,BOTH PMN AND MONONUCLEAR FEW GRAM NEGATIVE RODS RARE GRAM POSITIVE RODS RARE GRAM POSITIVE COCCI IN PAIRS SQUAMOUS EPITHELIAL CELLS PRESENT Gram Stain Report Called to,Read Back By and Verified With: D EVERETTE RN 1944 11/12/15 A BROWNING    Report Status 11/12/2015 FINAL  Final  MRSA PCR Screening     Status: None   Collection Time: 11/13/15  9:10 AM  Result Value Ref Range Status   MRSA by PCR NEGATIVE NEGATIVE Final    Comment:        The GeneXpert MRSA Assay (FDA approved for NASAL specimens only), is one component of a comprehensive MRSA colonization surveillance program. It is not intended to diagnose MRSA infection nor to guide or monitor treatment for MRSA infections.   Culture, blood (Routine X 2) w Reflex to ID Panel     Status: None   Collection Time: 11/16/15 12:02 PM  Result Value Ref Range Status   Specimen Description BLOOD LEFT ANTECUBITAL  Final   Special Requests BOTTLES DRAWN AEROBIC AND ANAEROBIC 5CC  Final   Culture NO GROWTH 5 DAYS  Final   Report Status 11/21/2015 FINAL  Final  Culture, blood (Routine X 2) w Reflex to ID Panel     Status: None   Collection Time: 11/16/15  2:21 PM   Result Value Ref Range Status   Specimen Description BLOOD LEFT ANTECUBITAL  Final   Special Requests BOTTLES DRAWN AEROBIC AND ANAEROBIC [place]  Final   Culture NO GROWTH 5 DAYS  Final   Report Status 11/21/2015 FINAL  Final     Studies: No results found.  Scheduled Meds: . antiseptic oral rinse  7 mL Mouth Rinse QID  . arformoterol  15 mcg Nebulization BID  . budesonide (PULMICORT) nebulizer solution  0.25 mg Nebulization BID  . cefTRIAXone (ROCEPHIN)  IV  2 g Intravenous QHS  . chlorhexidine gluconate  15 mL Mouth Rinse BID  . donepezil  5 mg Oral QHS  . feeding supplement (PRO-STAT SUGAR FREE 64)  30 mL Per Tube BID  . [START ON 11/23/2015] furosemide  40 mg Intravenous Daily  . insulin aspart  0-15 Units Subcutaneous 6 times per day  . omeprazole  40 mg Per Tube Daily  . simvastatin  10 mg Oral q1800  . tamsulosin  0.4 mg Oral Daily   Continuous Infusions: . sodium chloride    . sodium chloride 10 mL/hr at 11/17/15 1900  . amiodarone 30 mg/hr (11/22/15 1113)  . dextrose 5 % and 0.9% NaCl 75 mL/hr at 11/21/15 2300  . heparin 1,350 Units/hr (11/22/15 1116)    Active Problems:   Acute systolic heart failure (HCC)   Sepsis due to pneumonia ([place])   Acute encephalopathy   Influenza with pneumonia   Hypotension   Protein-calorie malnutrition, severe ([place])   Cardiac arrest ([place])   Polymorphic ventricular tachycardia (HCC)   Pressure ulcer   Encounter for imaging study to confirm orogastric (OG) tube  placement   Endotracheally intubated   Bacteremia due to Streptococcus   CHIU, [place] [facility] Pager [phone]. If 7PM-7AM, please contact night-coverage at [email or web address], password [facility] 11/22/2015, 2:44 PM  LOS: 11 days

## 2015-11-22 NOTE — Progress Notes (Signed)
ANTICOAGULATION CONSULT NOTE - follow-up Consult  Pharmacy Consult for heparin Indication: chest pain/ACS/Afib  Allergies  Allergen Reactions  . Amoxicillin Itching and Rash  . Penicillins Itching and Rash    Has patient had a PCN reaction causing immediate rash, facial/tongue/throat swelling, SOB or lightheadedness with hypotension: Yes Has patient had a PCN reaction causing severe rash involving mucus membranes or skin necrosis: No Has patient had a PCN reaction that required hospitalization No Has patient had a PCN reaction occurring within the last 10 years: No If all of the above answers are "NO", then may proceed with Cephalosporin use.     Patient Measurements: Height: 5\' 11"  (180.3 cm) Weight: 151 lb 9.6 oz (68.765 kg) (bedscale ) IBW/kg (Calculated) : 75.3 Heparin Dosing Weight: 71 kg  Vital Signs: Temp: 98.3 F (36.8 C) (03/19 0600) Temp Source: Oral (03/19 0600) BP: 105/55 mmHg (03/19 0600) Pulse Rate: 60 (03/19 0600)  Labs:  Recent Labs  11/20/15 0557 11/21/15 0517 11/22/15 0447  HGB 9.5* 8.5* 8.0*  HCT 31.8* 28.0* 26.7*  PLT 99* 93* 89*  HEPARINUNFRC 0.35 0.32 0.21*  CREATININE 2.25* 2.13* 2.01*    Estimated Creatinine Clearance: 24.7 mL/min (by C-G formula based on Cr of 2.01).  Assessment: 82 yo male with VF arrest on 3/10 > ROSC. Now with elevated troponin. Pt is on Xarelto PTA for history of AFib (last dose was 3/10 @ 1800) but was transitioned to a heparin drip.   HL subtherapeutic at 0.21, Hgb 8.0, Plts 89.   No s/sx bleeding or line issues per RN.  Goal of Therapy:  Heparin level 0.3-0.7 units/ml Monitor platelets by anticoagulation protocol: Yes   Plan:  Increase Heparin gtt to 1350 units/hr 8 hr HL Daily heparin level and CBC Will follow plans for oral [facility]  Bennye Alm, PharmD Pharmacy Resident [phone]

## 2015-11-23 LAB — CBC
HCT: 27.8 % — ABNORMAL LOW (ref 39.0–52.0)
Hemoglobin: 8.2 g/dL — ABNORMAL LOW (ref 13.0–17.0)
MCH: 26.8 pg (ref 26.0–34.0)
MCHC: 29.5 g/dL — ABNORMAL LOW (ref 30.0–36.0)
MCV: 90.8 fL (ref 78.0–100.0)
PLATELETS: 100 10*3/uL — AB (ref 150–400)
RBC: 3.06 MIL/uL — AB (ref 4.22–5.81)
RDW: 19.4 % — ABNORMAL HIGH (ref 11.5–15.5)
WBC: 19.3 10*3/uL — AB (ref 4.0–10.5)

## 2015-11-23 LAB — BASIC METABOLIC PANEL
ANION GAP: 6 (ref 5–15)
BUN: 37 mg/dL — ABNORMAL HIGH (ref 6–20)
CO2: 32 mmol/L (ref 22–32)
Calcium: 8.4 mg/dL — ABNORMAL LOW (ref 8.9–10.3)
Chloride: 104 mmol/L (ref 101–111)
Creatinine, Ser: 1.78 mg/dL — ABNORMAL HIGH (ref 0.61–1.24)
GFR, EST AFRICAN AMERICAN: 38 mL/min — AB (ref >60)
GFR, EST NON AFRICAN AMERICAN: 32 mL/min — AB (ref >60)
Glucose, Bld: 148 mg/dL — ABNORMAL HIGH (ref 65–99)
POTASSIUM: 3.6 mmol/L (ref 3.5–5.1)
SODIUM: 142 mmol/L (ref 135–145)

## 2015-11-23 LAB — GLUCOSE, CAPILLARY
GLUCOSE-CAPILLARY: 140 mg/dL — AB (ref 65–99)
GLUCOSE-CAPILLARY: 141 mg/dL — AB (ref 65–99)
GLUCOSE-CAPILLARY: 153 mg/dL — AB (ref 65–99)
GLUCOSE-CAPILLARY: 90 mg/dL (ref 65–99)
Glucose-Capillary: 180 mg/dL — ABNORMAL HIGH (ref 65–99)
Glucose-Capillary: 72 mg/dL (ref 65–99)

## 2015-11-23 LAB — HEPARIN LEVEL (UNFRACTIONATED)
HEPARIN UNFRACTIONATED: 0.61 [IU]/mL (ref 0.30–0.70)
Heparin Unfractionated: 0.64 IU/mL (ref 0.30–0.70)
Heparin Unfractionated: 0.73 IU/mL — ABNORMAL HIGH (ref 0.30–0.70)

## 2015-11-23 MED ORDER — ADULT MULTIVITAMIN W/MINERALS CH
1.0000 | ORAL_TABLET | Freq: Every day | ORAL | Status: DC
  Administered 2015-11-23 – 2015-12-01 (×9): 1 via ORAL
  Filled 2015-11-23 (×8): qty 1

## 2015-11-23 MED ORDER — PRO-STAT SUGAR FREE PO LIQD
30.0000 mL | Freq: Two times a day (BID) | ORAL | Status: DC
  Administered 2015-11-23 – 2015-12-01 (×13): 30 mL via ORAL
  Filled 2015-11-23 (×15): qty 30

## 2015-11-23 NOTE — Progress Notes (Signed)
ANTICOAGULATION CONSULT NOTE - Follow Up Consult  Pharmacy Consult for Heparin Indication: atrial fibrillation  Patient Measurements: Height: 5\' 11"  (180.3 cm) Weight: 150 lb 11.2 oz (68.357 kg) (bed scale) IBW/kg (Calculated) : 75.3 Heparin Dosing Weight: 68.4 kg  Labs:  Recent Labs  11/21/15 0517 11/22/15 0447  11/23/15 0410 11/23/15 1216 11/23/15 2052  HGB 8.5* 8.0*  --  8.2*  --   --   HCT 28.0* 26.7*  --  27.8*  --   --   PLT 93* 89*  --  100*  --   --   HEPARINUNFRC 0.32 0.21*  < > 0.64 0.73* 0.61  CREATININE 2.13* 2.01*  --  1.78*  --   --   < > = values in this interval not displayed.  Estimated Creatinine Clearance: 27.8 mL/min (by C-G formula based on Cr of 1.78).  Assessment:   Heparin level is therapeutic (0.61) tonight on 1400 units/hr.    Goal of Therapy:  Heparin level 0.3-0.7 units/ml Monitor platelets by anticoagulation protocol: Yes   Plan:   Continue heparin drip at 1400 units/hr.  Heparin level and CBC in am.  Arty Baumgartner, [place] Pager: [phone] 11/23/2015,10:21 PM

## 2015-11-23 NOTE — Progress Notes (Addendum)
ANTICOAGULATION CONSULT NOTE - follow-up Consult  Pharmacy Consult for heparin Indication: Afib  Allergies  Allergen Reactions  . Amoxicillin Itching and Rash  . Penicillins Itching and Rash    Has patient had a PCN reaction causing immediate rash, facial/tongue/throat swelling, SOB or lightheadedness with hypotension: Yes Has patient had a PCN reaction causing severe rash involving mucus membranes or skin necrosis: No Has patient had a PCN reaction that required hospitalization No Has patient had a PCN reaction occurring within the last 10 years: No If all of the above answers are "NO", then may proceed with Cephalosporin use.     Patient Measurements: Height: 5\' 11"  (180.3 cm) Weight: 150 lb 11.2 oz (68.357 kg) (bed scale) IBW/kg (Calculated) : 75.3 Heparin Dosing Weight: 71 kg  Vital Signs: Temp: 98 F (36.7 C) (03/20 0615) Temp Source: Oral (03/20 0615) BP: 145/56 mmHg (03/20 0615) Pulse Rate: 63 (03/20 0615)  Labs:  Recent Labs  11/21/15 0517 11/22/15 0447 11/22/15 1815 11/23/15 0410  HGB 8.5* 8.0*  --  8.2*  HCT 28.0* 26.7*  --  27.8*  PLT 93* 89*  --  100*  HEPARINUNFRC 0.32 0.21* 0.27* 0.64  CREATININE 2.13* 2.01*  --  1.78*    Estimated Creatinine Clearance: 27.8 mL/min (by C-G formula based on Cr of 1.78).  Assessment: 82 yo male with VF arrest on 3/10 > ROSC. Now with elevated troponin. Pt is on Xarelto PTA for history of AFib (last dose was 3/10 @ 1800) but was transitioned to a heparin drip.   HL is therapeutic at 0.64 on 1500 units/hr. No bleeding noted, Hb and pltc are low stable.  Goal of Therapy:  Heparin level 0.3-0.7 units/ml Monitor platelets by anticoagulation protocol: Yes   Plan:  Continue heparin drip at 1500 units/hr HL now to confirm Daily heparin level and CBC Will follow plans for oral [facility], [place].D., BCPS Clinical Pharmacist Pager: [phone] 11/23/2015 10:29 AM   Addendum: Confirmatory HL is above goal at  0.73. No bleeding noted.  Decrease heparin drip to 1400 units/hr 8 hr HL  [facility], Pharm.D., BCPS Clinical Pharmacist Pager: [phone] 11/23/2015 12:48 PM

## 2015-11-23 NOTE — Progress Notes (Signed)
[facility] PROGRESS NOTE  [name] [name] [id number] DOB: [birth year] DOA: 11/10/2015 PCP: Odette Fraction, MD  HPI/Brief narrative Please see admission H&P from 11/10/2015. Briefly the patient is an 82 year old male with a history of COPD, CAD status post CABG, A. fib, dementia who was initially admitted with acute proxy make rest 3 failure secondary to influenza a. During the course, the patient was noted to have worsening respiratory distress. He later had nonsustained V. tach and later found to be unresponsive and pulseless. Patient was intubated and transferred to ICU. Patient since been successfully extubated on 11/18/2015 and care transferred to the hospitalist service  Assessment/Plan: 1. Acute hypoxemic respiratory failure 1. Successfully extubated on 3/15. Stable at present 2. Patient on scheduled IV lasix given CXR findings worrisome for pulm edema that appears improved on follow up CXR 3. O2 requirements remain stable between 3-4L [place]. Cont to wean O2 as tolerated. Stop basal IVF and focus on diuresis 2. NSVT 1. Pt continued on amiodarone 2. Seems stable at present 3. Hx afib 1. Rate controlled 2. CHADS-VASc 2, previously on xarelto. 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 Baseline Cr around 1.2 2. Cr had remained in the 1.5-1.7 range, suspect secondary to end-organ damage from shock vs sepsis 3. Cr seems to be improving with gentle IVF. 4. Given supplemental O2 dependency, will d/c basal IVF 5. Thrombocytopenia 1. Improving 2. Possibly related to heparin vs end-organ damage as plts noted to be 51k on day of admit 6. Flu A with sepsis present on admission 7. Viridians strep bacteremia 1. Had been on empiric vanc and rocephin. Now on rocephin alone based on sensitivities 2. Discussed with ID. Recs for 10 days of tx. Will complete course after today (14 days total after today's dose) 3. WBC improving. Afebrile 8. Dementia 1. stable 9. DVT  prophylaxis 1. On heparin gtt  Code Status: DNR Family Communication: Pt in room Disposition Plan: Uncertain at this time, possible in 24-48hrs when pt is no longer O2 dependent   Consultants:  Critical Care  Procedures:    Antibiotics: Anti-infectives    Start     Dose/Rate Route Frequency Ordered Stop   11/16/15 1545  vancomycin (VANCOCIN) IVPB 750 mg/150 ml premix  Status:  Discontinued     750 mg 150 mL/hr over 60 Minutes Intravenous Every 24 hours 11/16/15 1543 11/18/15 1503   11/15/15 0000  vancomycin (VANCOCIN) 1,250 mg in sodium chloride 0.9 % 250 mL IVPB  Status:  Discontinued     1,250 mg 166.7 mL/hr over 90 Minutes Intravenous Every 24 hours 11/14/15 0653 11/15/15 1527   11/13/15 2345  cefTRIAXone (ROCEPHIN) 2 g in dextrose 5 % 50 mL IVPB     2 g 100 mL/hr over 30 Minutes Intravenous Daily at bedtime 11/13/15 2327     11/12/15 0900  cefTRIAXone (ROCEPHIN) 2 g in dextrose 5 % 50 mL IVPB  Status:  Discontinued     2 g 100 mL/hr over 30 Minutes Intravenous Every 24 hours 11/11/15 1323 11/11/15 1433   11/11/15 2200  oseltamivir (TAMIFLU) 6 MG/ML suspension 30 mg     30 mg Oral 2 times daily 11/11/15 1340 11/14/15 2152   11/11/15 1400  cefTRIAXone (ROCEPHIN) 1 g in dextrose 5 % 50 mL IVPB  Status:  Discontinued     1 g 100 mL/hr over 30 Minutes Intravenous  Once 11/11/15 1324 11/11/15 1433   11/11/15 1200  azithromycin (ZITHROMAX) 500 mg in dextrose 5 %  250 mL IVPB  Status:  Discontinued     500 mg 250 mL/hr over 60 Minutes Intravenous Every 24 hours 11/10/15 1318 11/11/15 1433   11/11/15 0900  cefTRIAXone (ROCEPHIN) 1 g in dextrose 5 % 50 mL IVPB  Status:  Discontinued     1 g 100 mL/hr over 30 Minutes Intravenous Every 24 hours 11/10/15 1318 11/11/15 1323   11/11/15 0600  vancomycin (VANCOCIN) IVPB 1000 mg/200 mL premix  Status:  Discontinued     1,000 mg 200 mL/hr over 60 Minutes Intravenous Every 24 hours 11/11/15 0528 11/14/15 0653   11/10/15 1500  oseltamivir  (TAMIFLU) 6 MG/ML suspension 75 mg  Status:  Discontinued     75 mg Oral 2 times daily 11/10/15 1448 11/11/15 1340   11/10/15 1130  azithromycin (ZITHROMAX) 500 mg in dextrose 5 % 250 mL IVPB     500 mg 250 mL/hr over 60 Minutes Intravenous  Once 11/10/15 1127 11/10/15 1319   11/10/15 0830  cefTRIAXone (ROCEPHIN) 1 g in dextrose 5 % 50 mL IVPB     1 g 100 mL/hr over 30 Minutes Intravenous  Once 11/10/15 0828 11/10/15 1222      HPI/Subjective: Eager to be discharged  Objective: Filed Vitals:   11/23/15 0615 11/23/15 1001 11/23/15 1136 11/23/15 1331  BP: 145/56  143/50   Pulse: 63  64   Temp: 98 F (36.7 C)  97.8 F (36.6 C)   TempSrc: Oral  Oral   Resp: 18  18   Height:      Weight: 68.357 kg (150 lb 11.2 oz)     SpO2: 97% 96% 97% 90%    Intake/Output Summary (Last 24 hours) at 11/23/15 1445 Last data filed at 11/23/15 1341  Gross per 24 hour  Intake 3142.05 ml  Output   1525 ml  Net 1617.05 ml   Filed Weights   11/21/15 0446 11/22/15 0600 11/23/15 0615  Weight: 66.724 kg (147 lb 1.6 oz) 68.765 kg (151 lb 9.6 oz) 68.357 kg (150 lb 11.2 oz)    Exam:   General:  Awake, in nad, laying in bed  Cardiovascular: regular, s1, s2  Respiratory: normal resp effort, no wheezing  Abdomen: soft, nondistended, pos BS  Musculoskeletal: perfused, no clubbing  Data Reviewed: Basic Metabolic Panel:  Recent Labs Lab 11/18/15 0330 11/19/15 0440 11/20/15 0557 11/21/15 0517 11/22/15 0447 11/23/15 0410  NA 146* 146* 145 143 144 142  K 4.0 3.8 3.4* 3.1* 3.3* 3.6  CL 107 102 98* 97* 103 104  CO2 27 32 33* 33* 34* 32  GLUCOSE 144* 100* 145* 166* 109* 148*  BUN 50* 47* 47* 46* 41* 37*  CREATININE 1.56* 1.95* 2.25* 2.13* 2.01* 1.78*  CALCIUM 8.9 8.9 9.1 8.5* 8.3* 8.4*  MG 2.2 2.2  --   --   --   --   PHOS 3.3 4.1  --   --   --   --    Liver Function Tests:  Recent Labs Lab 11/17/15 0219  AST 27  ALT 30  ALKPHOS 73  BILITOT 0.6  PROT 5.6*  ALBUMIN 2.7*   No  results for input(s): LIPASE, AMYLASE in the last 168 hours. No results for input(s): AMMONIA in the last 168 hours. CBC:  Recent Labs Lab 11/19/15 0440 11/20/15 0557 11/21/15 0517 11/22/15 0447 11/23/15 0410  WBC 29.1* 24.0* 20.7* 19.8* 19.3*  HGB 8.9* 9.5* 8.5* 8.0* 8.2*  HCT 30.2* 31.8* 28.0* 26.7* 27.8*  MCV 91.0 89.8 89.5 89.9  90.8  PLT 96* 99* 93* 89* 100*   Cardiac Enzymes: No results for input(s): CKTOTAL, CKMB, CKMBINDEX, TROPONINI in the last 168 hours. BNP (last 3 results)  Recent Labs  11/10/15 1456 11/16/15 0353  BNP 1792.8* 406.2*    ProBNP (last 3 results) No results for input(s): PROBNP in the last 8760 hours.  CBG:  Recent Labs Lab 11/22/15 2006 11/23/15 0051 11/23/15 0540 11/23/15 0828 11/23/15 1140  GLUCAP 192* 90 140* 141* 180*    Recent Results (from the past 240 hour(s))  Culture, blood (Routine X 2) w Reflex to ID Panel     Status: None   Collection Time: 11/16/15 12:02 PM  Result Value Ref Range Status   Specimen Description BLOOD LEFT ANTECUBITAL  Final   Special Requests BOTTLES DRAWN AEROBIC AND ANAEROBIC 5CC  Final   Culture NO GROWTH 5 DAYS  Final   Report Status 11/21/2015 FINAL  Final  Culture, blood (Routine X 2) w Reflex to ID Panel     Status: None   Collection Time: 11/16/15  2:21 PM  Result Value Ref Range Status   Specimen Description BLOOD LEFT ANTECUBITAL  Final   Special Requests BOTTLES DRAWN AEROBIC AND ANAEROBIC [place]  Final   Culture NO GROWTH 5 DAYS  Final   Report Status 11/21/2015 FINAL  Final     Studies: No results found.  Scheduled Meds: . antiseptic oral rinse  7 mL Mouth Rinse QID  . arformoterol  15 mcg Nebulization BID  . budesonide (PULMICORT) nebulizer solution  0.25 mg Nebulization BID  . cefTRIAXone (ROCEPHIN)  IV  2 g Intravenous QHS  . chlorhexidine gluconate  15 mL Mouth Rinse BID  . donepezil  5 mg Oral QHS  . feeding supplement (PRO-STAT SUGAR FREE 64)  30 mL Per Tube BID  . furosemide   40 mg Intravenous Daily  . insulin aspart  0-15 Units Subcutaneous 6 times per day  . omeprazole  40 mg Per Tube Daily  . simvastatin  10 mg Oral q1800  . tamsulosin  0.4 mg Oral Daily   Continuous Infusions: . sodium chloride    . sodium chloride 10 mL/hr at 11/17/15 1900  . amiodarone 30 mg/hr (11/23/15 0111)  . dextrose 5 % and 0.9% NaCl 75 mL/hr at 11/22/15 1957  . heparin 1,400 Units/hr (11/23/15 1400)    Active Problems:   Acute systolic heart failure (HCC)   Sepsis due to pneumonia ([place])   Acute encephalopathy   Influenza with pneumonia   Hypotension   Protein-calorie malnutrition, severe ([place])   Cardiac arrest ([place])   Polymorphic ventricular tachycardia (HCC)   Pressure ulcer   Encounter for imaging study to confirm orogastric (OG) tube placement   Endotracheally intubated   Bacteremia due to Streptococcus   [facility], [place] [facility] Pager [phone]. If 7PM-7AM, please contact night-coverage at [email or web address], password [facility] 11/23/2015, 2:45 PM  LOS: 12 days

## 2015-11-23 NOTE — Progress Notes (Signed)
Nutrition Follow-up  DOCUMENTATION CODES:   Severe malnutrition in context of acute illness/injury  INTERVENTION:  Provide Pro-Stat po BID, each supplement provides 100 kcal and 15 grams of protein  Provide Multivitamin with minerals daily  NUTRITION DIAGNOSIS:   Malnutrition related to acute illness as evidenced by moderate depletions of muscle mass, severe depletion of muscle mass, energy intake < or equal to 50% for > or equal to 5 days.  Ongoing  GOAL:   Patient will meet greater than or equal to 90% of their needs  Unmet  MONITOR:   Vent status, TF tolerance, Labs, I & O's   ASSESSMENT:   82 year old male with past medical history of CAD, diastolic CHF, atrial fibrillation, and COPD who presented to the emergency room on 3/7 after several days of generalized weakness and confusion plus fever and productive cough with brown sputum. Influenza A cultures positive as were blood cultures for gram-positive cocci in chains.   Pt states he is feeling better and denies any stomach or throat pain. Pt states that his appetite is good and he is eating well. Per nursing notes, pt is eating 25% to 75% of most meals, 0% of some meals. Pt states that this is normal for him as he usually only eats one meal per day.  RD emphasized the importance of adequate energy and protein intake and encouraged pt to eat 3 meals daily.   Labs: low hemoglobin, low calcium  Diet Order:  Diet Heart Room service appropriate?: Yes; Fluid consistency:: Thin  Skin:  Wound (see comment) (stage I pressure ulcer to sacrum)  Last BM:  3/13  Height:   Ht Readings from Last 1 Encounters:  11/18/15 5\' 11"  (1.803 m)    Weight:   Wt Readings from Last 1 Encounters:  11/23/15 150 lb 11.2 oz (68.357 kg)    Ideal Body Weight:  72.7 kg  BMI:  Body mass index is 21.03 kg/(m^2).  Estimated Nutritional Needs:   Kcal:  1600-1800  Protein:  95-110 grams  Fluid:  1.6-1.8 L/day  EDUCATION NEEDS:   No  education needs identified at this time  [place], LDN Inpatient Clinical Dietitian Pager: [phone] After Hours Pager: [phone]

## 2015-11-24 LAB — HEPARIN LEVEL (UNFRACTIONATED): HEPARIN UNFRACTIONATED: 0.5 [IU]/mL (ref 0.30–0.70)

## 2015-11-24 LAB — BASIC METABOLIC PANEL
Anion gap: 7 (ref 5–15)
BUN: 29 mg/dL — ABNORMAL HIGH (ref 6–20)
CALCIUM: 8.6 mg/dL — AB (ref 8.9–10.3)
CHLORIDE: 105 mmol/L (ref 101–111)
CO2: 32 mmol/L (ref 22–32)
CREATININE: 1.75 mg/dL — AB (ref 0.61–1.24)
GFR, EST AFRICAN AMERICAN: 38 mL/min — AB (ref >60)
GFR, EST NON AFRICAN AMERICAN: 33 mL/min — AB (ref >60)
Glucose, Bld: 129 mg/dL — ABNORMAL HIGH (ref 65–99)
Potassium: 3.5 mmol/L (ref 3.5–5.1)
SODIUM: 144 mmol/L (ref 135–145)

## 2015-11-24 LAB — GLUCOSE, CAPILLARY
GLUCOSE-CAPILLARY: 131 mg/dL — AB (ref 65–99)
GLUCOSE-CAPILLARY: 159 mg/dL — AB (ref 65–99)
Glucose-Capillary: 123 mg/dL — ABNORMAL HIGH (ref 65–99)
Glucose-Capillary: 112 mg/dL — ABNORMAL HIGH (ref 65–99)
Glucose-Capillary: 159 mg/dL — ABNORMAL HIGH (ref 65–99)
Glucose-Capillary: 97 mg/dL (ref 65–99)

## 2015-11-24 LAB — CBC
HCT: 27.5 % — ABNORMAL LOW (ref 39.0–52.0)
HEMOGLOBIN: 8 g/dL — AB (ref 13.0–17.0)
MCH: 26.4 pg (ref 26.0–34.0)
MCHC: 29.1 g/dL — AB (ref 30.0–36.0)
MCV: 90.8 fL (ref 78.0–100.0)
PLATELETS: 108 10*3/uL — AB (ref 150–400)
RBC: 3.03 MIL/uL — ABNORMAL LOW (ref 4.22–5.81)
RDW: 19.5 % — AB (ref 11.5–15.5)
WBC: 19.3 10*3/uL — ABNORMAL HIGH (ref 4.0–10.5)

## 2015-11-24 MED ORDER — ZOLPIDEM TARTRATE 5 MG PO TABS: 5.0000 mg | ORAL_TABLET | Freq: Every evening | ORAL | Status: DC | PRN

## 2015-11-24 MED ORDER — RIVAROXABAN 15 MG PO TABS
15.0000 mg | ORAL_TABLET | Freq: Every day | ORAL | Status: DC
  Administered 2015-11-24 – 2015-12-01 (×8): 15 mg via ORAL
  Filled 2015-11-24 (×9): qty 1

## 2015-11-24 MED ORDER — INSULIN ASPART 100 UNIT/ML ~~LOC~~ SOLN: 0.0000 [IU] | Freq: Three times a day (TID) | SUBCUTANEOUS | Status: DC

## 2015-11-24 MED ORDER — AMIODARONE HCL 200 MG PO TABS
200.0000 mg | ORAL_TABLET | Freq: Every day | ORAL | Status: DC
  Administered 2015-11-24 – 2015-12-01 (×8): 200 mg via ORAL
  Filled 2015-11-24 (×8): qty 1

## 2015-11-24 MED ORDER — INSULIN ASPART 100 UNIT/ML ~~LOC~~ SOLN
0.0000 [IU] | Freq: Three times a day (TID) | SUBCUTANEOUS | Status: DC
  Administered 2015-11-24: 2 [IU] via SUBCUTANEOUS
  Administered 2015-11-24: 3 [IU] via SUBCUTANEOUS
  Administered 2015-11-25 (×2): 2 [IU] via SUBCUTANEOUS
  Administered 2015-11-26 – 2015-11-27 (×2): 3 [IU] via SUBCUTANEOUS
  Administered 2015-11-28 – 2015-11-29 (×2): 2 [IU] via SUBCUTANEOUS
  Administered 2015-11-30: 3 [IU] via SUBCUTANEOUS
  Administered 2015-12-01 (×2): 2 [IU] via SUBCUTANEOUS

## 2015-11-24 NOTE — Progress Notes (Signed)
[facility] PROGRESS NOTE  [name] [name] [id number] DOB: 10-06-1925 DOA: 11/10/2015 PCP: Odette Fraction, MD  HPI/Brief narrative Please see admission H&P from 11/10/2015. Briefly the patient is an 82 year old male with a history of COPD, CAD status post CABG, A. fib, dementia who was initially admitted with acute proxy make rest 3 failure secondary to influenza a. During the course, the patient was noted to have worsening respiratory distress. He later had nonsustained V. tach and later found to be unresponsive and pulseless. Patient was intubated and transferred to ICU. Patient since been successfully extubated on 11/18/2015 and care transferred to the hospitalist service  Assessment/Plan: 1. Acute hypoxemic respiratory failure 1. Successfully extubated on 3/15. Stable at present 2. Patient on scheduled IV lasix given CXR findings worrisome for pulm edema that appears improved on follow up CXR 3. O2 requirements are improving slowly, now down to 2 [place]. Cont to wean O2 as tolerated with goal of RA 2. NSVT 1. Pt continued on amiodarone, transition to PO 2. Remains stable 3. Hx afib 1. Rate controlled 2. CHADS-VASc 2, previously on xarelto. Had remained on heparin gtt. 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 Baseline Cr around 1.2 2. Cr had remained in the 1.5-1.7 range, suspect secondary to end-organ damage from shock vs sepsis 3. Cr had improved with gentle IVF, now stopped since goal is continued diuresis 5. Thrombocytopenia 1. Improving 2. Possibly related to heparin vs end-organ damage as plts noted to be 51k on day of admit 6. Flu A with sepsis present on admission 7. Viridians strep bacteremia 1. Had been on empiric vanc and rocephin. Now on rocephin alone based on sensitivities 2. Discussed with ID. Recs for 10 days of tx. Completed course of abx on 3/20 (14 days total) 3. Remains afebrile 8. Dementia 1. stable 9. DVT prophylaxis 1. Transition to  xarelto  Code Status: DNR Family Communication: Pt in room Disposition Plan: Uncertain at this time, possible in 24-48hrs when pt is no longer O2 dependent   Consultants:  Critical Care  Procedures:    Antibiotics: Anti-infectives    Start     Dose/Rate Route Frequency Ordered Stop   11/16/15 1545  vancomycin (VANCOCIN) IVPB 750 mg/150 ml premix  Status:  Discontinued     750 mg 150 mL/hr over 60 Minutes Intravenous Every 24 hours 11/16/15 1543 11/18/15 1503   11/15/15 0000  vancomycin (VANCOCIN) 1,250 mg in sodium chloride 0.9 % 250 mL IVPB  Status:  Discontinued     1,250 mg 166.7 mL/hr over 90 Minutes Intravenous Every 24 hours 11/14/15 0653 11/15/15 1527   11/13/15 2345  cefTRIAXone (ROCEPHIN) 2 g in dextrose 5 % 50 mL IVPB  Status:  Discontinued     2 g 100 mL/hr over 30 Minutes Intravenous Daily at bedtime 11/13/15 2327 11/23/15 1450   11/12/15 0900  cefTRIAXone (ROCEPHIN) 2 g in dextrose 5 % 50 mL IVPB  Status:  Discontinued     2 g 100 mL/hr over 30 Minutes Intravenous Every 24 hours 11/11/15 1323 11/11/15 1433   11/11/15 2200  oseltamivir (TAMIFLU) 6 MG/ML suspension 30 mg     30 mg Oral 2 times daily 11/11/15 1340 11/14/15 2152   11/11/15 1400  cefTRIAXone (ROCEPHIN) 1 g in dextrose 5 % 50 mL IVPB  Status:  Discontinued     1 g 100 mL/hr over 30 Minutes Intravenous  Once 11/11/15 1324 11/11/15 1433   11/11/15 1200  azithromycin (ZITHROMAX) 500 mg in dextrose 5 %  250 mL IVPB  Status:  Discontinued     500 mg 250 mL/hr over 60 Minutes Intravenous Every 24 hours 11/10/15 1318 11/11/15 1433   11/11/15 0900  cefTRIAXone (ROCEPHIN) 1 g in dextrose 5 % 50 mL IVPB  Status:  Discontinued     1 g 100 mL/hr over 30 Minutes Intravenous Every 24 hours 11/10/15 1318 11/11/15 1323   11/11/15 0600  vancomycin (VANCOCIN) IVPB 1000 mg/200 mL premix  Status:  Discontinued     1,000 mg 200 mL/hr over 60 Minutes Intravenous Every 24 hours 11/11/15 0528 11/14/15 0653   11/10/15 1500   oseltamivir (TAMIFLU) 6 MG/ML suspension 75 mg  Status:  Discontinued     75 mg Oral 2 times daily 11/10/15 1448 11/11/15 1340   11/10/15 1130  azithromycin (ZITHROMAX) 500 mg in dextrose 5 % 250 mL IVPB     500 mg 250 mL/hr over 60 Minutes Intravenous  Once 11/10/15 1127 11/10/15 1319   11/10/15 0830  cefTRIAXone (ROCEPHIN) 1 g in dextrose 5 % 50 mL IVPB     1 g 100 mL/hr over 30 Minutes Intravenous  Once 11/10/15 0828 11/10/15 1222      HPI/Subjective: States feeling overall better  Objective: Filed Vitals:   11/24/15 0015 11/24/15 0311 11/24/15 1111 11/24/15 1245  BP: 126/55 115/67  118/75  Pulse: 64 61  61  Temp:  97.6 F (36.4 C)  97.2 F (36.2 C)  TempSrc:  Oral  Oral  Resp: 20 22  18   Height:      Weight:  67.586 kg (149 lb)    SpO2: 94% 92% 98% 100%    Intake/Output Summary (Last 24 hours) at 11/24/15 1334 Last data filed at 11/24/15 [id number]  Gross per 24 hour  Intake  738.4 ml  Output      0 ml  Net  738.4 ml   Filed Weights   11/22/15 0600 11/23/15 0615 11/24/15 0311  Weight: 68.765 kg (151 lb 9.6 oz) 68.357 kg (150 lb 11.2 oz) 67.586 kg (149 lb)    Exam:   General:  Awake, sitting in chair, in nad  Cardiovascular: regular, s1, s2  Respiratory: normal resp effort, no wheezing  Abdomen: soft, nondistended, pos BS  Musculoskeletal: perfused, no clubbing, no cyanosis  Data Reviewed: Basic Metabolic Panel:  Recent Labs Lab 11/18/15 0330 11/19/15 0440 11/20/15 0557 11/21/15 0517 11/22/15 0447 11/23/15 0410 11/24/15 0422  NA 146* 146* 145 143 144 142 144  K 4.0 3.8 3.4* 3.1* 3.3* 3.6 3.5  CL 107 102 98* 97* 103 104 105  CO2 27 32 33* 33* 34* 32 32  GLUCOSE 144* 100* 145* 166* 109* 148* 129*  BUN 50* 47* 47* 46* 41* 37* 29*  CREATININE 1.56* 1.95* 2.25* 2.13* 2.01* 1.78* 1.75*  CALCIUM 8.9 8.9 9.1 8.5* 8.3* 8.4* 8.6*  MG 2.2 2.2  --   --   --   --   --   PHOS 3.3 4.1  --   --   --   --   --    Liver Function Tests: No results for input(s):  AST, ALT, ALKPHOS, BILITOT, PROT, ALBUMIN in the last 168 hours. No results for input(s): LIPASE, AMYLASE in the last 168 hours. No results for input(s): AMMONIA in the last 168 hours. CBC:  Recent Labs Lab 11/20/15 0557 11/21/15 0517 11/22/15 0447 11/23/15 0410 11/24/15 0422  WBC 24.0* 20.7* 19.8* 19.3* 19.3*  HGB 9.5* 8.5* 8.0* 8.2* 8.0*  HCT 31.8* 28.0* 26.7*  27.8* 27.5*  MCV 89.8 89.5 89.9 90.8 90.8  PLT 99* 93* 89* 100* 108*   Cardiac Enzymes: No results for input(s): CKTOTAL, CKMB, CKMBINDEX, TROPONINI in the last 168 hours. BNP (last 3 results)  Recent Labs  11/10/15 1456 11/16/15 0353  BNP 1792.8* 406.2*    ProBNP (last 3 results) No results for input(s): PROBNP in the last 8760 hours.  CBG:  Recent Labs Lab 11/23/15 2000 11/24/15 0015 11/24/15 0313 11/24/15 0812 11/24/15 1132  GLUCAP 72 159* 123* 112* 131*    Recent Results (from the past 240 hour(s))  Culture, blood (Routine X 2) w Reflex to ID Panel     Status: None   Collection Time: 11/16/15 12:02 PM  Result Value Ref Range Status   Specimen Description BLOOD LEFT ANTECUBITAL  Final   Special Requests BOTTLES DRAWN AEROBIC AND ANAEROBIC 5CC  Final   Culture NO GROWTH 5 DAYS  Final   Report Status 11/21/2015 FINAL  Final  Culture, blood (Routine X 2) w Reflex to ID Panel     Status: None   Collection Time: 11/16/15  2:21 PM  Result Value Ref Range Status   Specimen Description BLOOD LEFT ANTECUBITAL  Final   Special Requests BOTTLES DRAWN AEROBIC AND ANAEROBIC [place]  Final   Culture NO GROWTH 5 DAYS  Final   Report Status 11/21/2015 FINAL  Final     Studies: No results found.  Scheduled Meds: . amiodarone  200 mg Oral Daily  . antiseptic oral rinse  7 mL Mouth Rinse QID  . arformoterol  15 mcg Nebulization BID  . budesonide (PULMICORT) nebulizer solution  0.25 mg Nebulization BID  . chlorhexidine gluconate  15 mL Mouth Rinse BID  . donepezil  5 mg Oral QHS  . feeding supplement  (PRO-STAT SUGAR FREE 64)  30 mL Oral BID  . furosemide  40 mg Intravenous Daily  . insulin aspart  0-15 Units Subcutaneous TID WC  . multivitamin with minerals  1 tablet Oral Daily  . omeprazole  40 mg Per Tube Daily  . rivaroxaban  15 mg Oral Q supper  . simvastatin  10 mg Oral q1800  . tamsulosin  0.4 mg Oral Daily   Continuous Infusions: . sodium chloride    . sodium chloride 10 mL/hr at 11/17/15 1900    Active Problems:   Acute systolic heart failure (HCC)   Sepsis due to pneumonia ([place])   Acute encephalopathy   Influenza with pneumonia   Hypotension   Protein-calorie malnutrition, severe ([place])   Cardiac arrest ([place])   Polymorphic ventricular tachycardia (HCC)   Pressure ulcer   Encounter for imaging study to confirm orogastric (OG) tube placement   Endotracheally intubated   Bacteremia due to Streptococcus   [name] [name], [place] [facility] Pager [phone]. If 7PM-7AM, please contact night-coverage at [email or web address], password [facility] 11/24/2015, 1:34 PM  LOS: 13 days

## 2015-11-24 NOTE — Progress Notes (Signed)
Physical Therapy Treatment Patient Details Name: [name] [name] MRN: [id number] DOB: 10-08-1925 Today's Date: 11/24/2015    History of Present Illness presented with fever and dyspnea. r/o sepsis. +flu and CHF exacerbation PMHx-HIV, Hepatitis C, CAD, CHF, afib, dementia, COPD. Pt s/p cardiac arrest 3/10 with CPR for 8 mins requiring intubation 3/10-3/15.     PT Comments    Patient progressing this session able to ambulate in room despite fatigue and SOB.  SpO2 WNL after ambulation on O2 97%.  Will need continued skilled PT in the acute setting and continue to recommend SNF level PT at d.c.  Follow Up Recommendations  SNF;Supervision/Assistance - 24 hour     Equipment Recommendations  Rolling walker with 5" wheels    Recommendations for Other Services       Precautions / Restrictions Precautions Precautions: Fall Precaution Comments: Multiple falls PTA. Watch 02 sats, chest sore from CPR    Mobility  Bed Mobility Overal bed mobility: Needs Assistance       Supine to sit: Min assist;HOB elevated        Transfers Overall transfer level: Needs assistance   Transfers: Sit to/from Stand Sit to Stand: Min assist;+2 safety/equipment         General transfer comment: 2 person A for multiple lines and anticipated needed more assist, but pt able to stand with min A overall  Ambulation/Gait Ambulation/Gait assistance: Mod assist Ambulation Distance (Feet): 17 Feet Assistive device: Rolling walker (2 wheeled) Gait Pattern/deviations: Step-through pattern;Decreased stride length     General Gait Details: maintained on O2 throughout; pt drooling and difficulty managing secretions (upon entry had yonker in his mouth); patient with flexed posture and needing assist for walker management; demonstrates increasd WOB with mobility   Stairs            Wheelchair Mobility    Modified Rankin (Stroke Patients Only)       Balance     Sitting balance-Leahy Scale:  Fair Sitting balance - Comments: R rib hump with kyphoscoliosis in sitting, but sits unsupported with mild posterior bias Postural control: Other (comment)   Standing balance-Leahy Scale: Poor Standing balance comment: UE support and assist for balance                    Cognition Arousal/Alertness: Awake/alert Behavior During Therapy: WFL for tasks assessed/performed Overall Cognitive Status: No family/caregiver present to determine baseline cognitive functioning       Memory: Decreased short-term memory              Exercises General Exercises - Lower Extremity Ankle Circles/Pumps: AROM;Both;5 reps;Supine Heel Slides: AROM;Both;Other reps (comment);Supine (2)    General Comments        Pertinent Vitals/Pain Faces Pain Scale: Hurts little more Pain Location: generalized Pain Descriptors / Indicators: Sore Pain Intervention(s): Repositioned;Monitored during session;Limited activity within patient's tolerance    Home Living                      Prior Function            PT Goals (current goals can now be found in the care plan section) Progress towards PT goals: Progressing toward goals    Frequency  Min 3X/week    PT Plan Current plan remains appropriate;Frequency needs to be updated    Co-evaluation             End of Session Equipment Utilized During Treatment: Gait belt;Oxygen Activity Tolerance: Patient limited by  fatigue Patient left: in chair;with call bell/phone within reach;with chair alarm set     Time: [phone] PT Time Calculation (min) (ACUTE ONLY): 20 min  Charges:  $Gait Training: 8-22 mins                    G Codes:      Reginia Naas 2015-11-30, 10:55 AM  Magda Kiel, [place] November 30, 2015

## 2015-11-24 NOTE — Progress Notes (Signed)
ANTICOAGULATION CONSULT NOTE - follow-up Consult  Pharmacy Consult for heparin --> Xarelto Indication: Afib  Patient Measurements: Height: 5\' 11"  (180.3 cm) Weight: 149 lb (67.586 kg) (bed scale) IBW/kg (Calculated) : 75.3   Labs:  Recent Labs  11/22/15 0447  11/23/15 0410 11/23/15 1216 11/23/15 2052 11/24/15 0422  HGB 8.0*  --  8.2*  --   --  8.0*  HCT 26.7*  --  27.8*  --   --  27.5*  PLT 89*  --  100*  --   --  108*  HEPARINUNFRC 0.21*  < > 0.64 0.73* 0.61 0.50  CREATININE 2.01*  --  1.78*  --   --  1.75*  < > = values in this interval not displayed.  Estimated Creatinine Clearance: 27.9 mL/min (by C-G formula based on Cr of 1.75).  Assessment: 82 yo male with VF arrest on 3/10 > ROSC. Now with elevated troponin. Pt is on Xarelto PTA for history of AFib (last dose was 3/10 @ 1800) but was transitioned to a heparin drip, now to transition back to Xarelto.   HL is therapeutic at 0.5 on 1400 units/hr. No bleeding noted, Hb and pltc are low stable.  Goal of Therapy:  Heparin level 0.3-0.7 units/ml Monitor platelets by anticoagulation protocol: Yes   Plan:  Stop heparin drip at 11:59 just before Xarelto dose Xarelto 15 mg PO daily with supper but 1st dose with lunch today Pharmacy will continue to follow peripherally  [facility], [place].D., BCPS Clinical Pharmacist Pager: [phone] 11/24/2015 11:11 AM

## 2015-11-24 NOTE — Progress Notes (Signed)
Pt a/o HOH, PRN pain meds given as ordered, heparin and amiodarone gtt d/c'd, VSS, pt stable

## 2015-11-25 DIAGNOSIS — E43 Unspecified severe protein-calorie malnutrition: Secondary | ICD-10-CM

## 2015-11-25 LAB — CBC
HCT: 26.6 % — ABNORMAL LOW (ref 39.0–52.0)
HEMOGLOBIN: 7.8 g/dL — AB (ref 13.0–17.0)
MCH: 26.6 pg (ref 26.0–34.0)
MCHC: 29.3 g/dL — ABNORMAL LOW (ref 30.0–36.0)
MCV: 90.8 fL (ref 78.0–100.0)
PLATELETS: 106 10*3/uL — AB (ref 150–400)
RBC: 2.93 MIL/uL — AB (ref 4.22–5.81)
RDW: 19.6 % — ABNORMAL HIGH (ref 11.5–15.5)
WBC: 16.3 10*3/uL — AB (ref 4.0–10.5)

## 2015-11-25 LAB — BASIC METABOLIC PANEL
Anion gap: 9 (ref 5–15)
BUN: 34 mg/dL — AB (ref 6–20)
CALCIUM: 8.9 mg/dL (ref 8.9–10.3)
CO2: 32 mmol/L (ref 22–32)
CREATININE: 1.84 mg/dL — AB (ref 0.61–1.24)
Chloride: 104 mmol/L (ref 101–111)
GFR calc non Af Amer: 31 mL/min — ABNORMAL LOW (ref >60)
GFR, EST AFRICAN AMERICAN: 36 mL/min — AB (ref >60)
Glucose, Bld: 111 mg/dL — ABNORMAL HIGH (ref 65–99)
Potassium: 4 mmol/L (ref 3.5–5.1)
SODIUM: 145 mmol/L (ref 135–145)

## 2015-11-25 LAB — HEPARIN LEVEL (UNFRACTIONATED): HEPARIN UNFRACTIONATED: 2.02 [IU]/mL — AB (ref 0.30–0.70)

## 2015-11-25 LAB — GLUCOSE, CAPILLARY
GLUCOSE-CAPILLARY: 125 mg/dL — AB (ref 65–99)
Glucose-Capillary: 105 mg/dL — ABNORMAL HIGH (ref 65–99)
Glucose-Capillary: 138 mg/dL — ABNORMAL HIGH (ref 65–99)
Glucose-Capillary: 83 mg/dL (ref 65–99)

## 2015-11-25 NOTE — Progress Notes (Addendum)
[facility] PROGRESS NOTE  [name] [name] [id number] DOB: September 29, 1925 DOA: 11/10/2015 PCP: Odette Fraction, MD  HPI/Brief narrative Please see admission H&P from 11/10/2015. Briefly the patient is an 82 year old male with a history of COPD, CAD status post CABG, A. fib, dementia who was initially admitted with acute proxy make rest 3 failure secondary to influenza a. During the course, the patient was noted to have worsening respiratory distress. He later had nonsustained V. tach and later found to be unresponsive and pulseless. Patient was intubated and transferred to ICU. Patient since been successfully extubated on 11/18/2015 and care transferred to the hospitalist service  Assessment/Plan: 1. Acute hypoxemic respiratory failure/vfib arrest on 3/10 1. Successfully extubated on 3/15. Stable at present 2. Patient on scheduled IV lasix given CXR findings worrisome for pulm edema that appears improved on follow up CXR 3. O2 requirements are improving slowly, now down to 2 [place]. Cont to wean O2 as tolerated with goal of RA 2. NSVT 1. Pt continued on amiodarone, transition to PO 2. Remains stable 3. Hx afib and heart block s/p pacemaker 1. Rate controlled, paced rhythm 2. CHADS-VASc 2, previously on xarelto. Had remained on heparin gtt. 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 Baseline Cr around 1.2 2. Cr had remained in the 1.5-1.7 range, suspect secondary to end-organ damage from shock vs sepsis 3. Cr had improved with gentle IVF, now stopped since goal is continued diuresis 5. Thrombocytopenia 1. Possibly related to heparin vs end-organ damage as plts noted to be 51k on day of admit 2. improving 6. Flu A with sepsis present on admission, treated 7. Viridians strep bacteremia 1. Had been on empiric vanc and rocephin. Now on rocephin alone based on sensitivities 2. Discussed with ID. Recs for 10 days of tx. Completed course of abx on 3/20 (14 days total) 3. Remains  afebrile, repeat blood culture no growth 8. Anemia; normocytic, close to baseline, likely anemia of chronic disease, check FOBT. No need of blood transfusion this admission 9. Leukocytosis: better 10. Dementia 1. stable 11. DVT prophylaxis 1. Transition to xarelto  Code Status: DNR, code status was discussed with family by icu attending Family Communication: Pt in room Disposition Plan: Uncertain at this time, possible in 24-48hrs when pt is no longer O2 dependent   Consultants:  Critical Care  cardiology  Procedures:  Intubation 3/10 and extubation 3/15  Code blue, CPR, defibrillation on 3/10  picc line placement  TEE 3/15  Antibiotics: Anti-infectives    Start     Dose/Rate Route Frequency Ordered Stop   11/16/15 1545  vancomycin (VANCOCIN) IVPB 750 mg/150 ml premix  Status:  Discontinued     750 mg 150 mL/hr over 60 Minutes Intravenous Every 24 hours 11/16/15 1543 11/18/15 1503   11/15/15 0000  vancomycin (VANCOCIN) 1,250 mg in sodium chloride 0.9 % 250 mL IVPB  Status:  Discontinued     1,250 mg 166.7 mL/hr over 90 Minutes Intravenous Every 24 hours 11/14/15 0653 11/15/15 1527   11/13/15 2345  cefTRIAXone (ROCEPHIN) 2 g in dextrose 5 % 50 mL IVPB  Status:  Discontinued     2 g 100 mL/hr over 30 Minutes Intravenous Daily at bedtime 11/13/15 2327 11/23/15 1450   11/12/15 0900  cefTRIAXone (ROCEPHIN) 2 g in dextrose 5 % 50 mL IVPB  Status:  Discontinued     2 g 100 mL/hr over 30 Minutes Intravenous Every 24 hours 11/11/15 1323 11/11/15 1433   11/11/15 2200  oseltamivir (TAMIFLU)  6 MG/ML suspension 30 mg     30 mg Oral 2 times daily 11/11/15 1340 11/14/15 2152   11/11/15 1400  cefTRIAXone (ROCEPHIN) 1 g in dextrose 5 % 50 mL IVPB  Status:  Discontinued     1 g 100 mL/hr over 30 Minutes Intravenous  Once 11/11/15 1324 11/11/15 1433   11/11/15 1200  azithromycin (ZITHROMAX) 500 mg in dextrose 5 % 250 mL IVPB  Status:  Discontinued     500 mg 250 mL/hr over 60 Minutes  Intravenous Every 24 hours 11/10/15 1318 11/11/15 1433   11/11/15 0900  cefTRIAXone (ROCEPHIN) 1 g in dextrose 5 % 50 mL IVPB  Status:  Discontinued     1 g 100 mL/hr over 30 Minutes Intravenous Every 24 hours 11/10/15 1318 11/11/15 1323   11/11/15 0600  vancomycin (VANCOCIN) IVPB 1000 mg/200 mL premix  Status:  Discontinued     1,000 mg 200 mL/hr over 60 Minutes Intravenous Every 24 hours 11/11/15 0528 11/14/15 0653   11/10/15 1500  oseltamivir (TAMIFLU) 6 MG/ML suspension 75 mg  Status:  Discontinued     75 mg Oral 2 times daily 11/10/15 1448 11/11/15 1340   11/10/15 1130  azithromycin (ZITHROMAX) 500 mg in dextrose 5 % 250 mL IVPB     500 mg 250 mL/hr over 60 Minutes Intravenous  Once 11/10/15 1127 11/10/15 1319   11/10/15 0830  cefTRIAXone (ROCEPHIN) 1 g in dextrose 5 % 50 mL IVPB     1 g 100 mL/hr over 30 Minutes Intravenous  Once 11/10/15 0828 11/10/15 1222      HPI/Subjective: Frail elderly male laying in bed on 2liter of oxygen, report still some cough, denies pain, over all feeling better, baseline dementia, not oriented to time  Objective: Filed Vitals:   11/24/15 2312 11/25/15 0739 11/25/15 0931 11/25/15 0934  BP: 113/52 102/50    Pulse: 61 57    Temp: 98 F (36.7 C) 98.2 F (36.8 C)    TempSrc: Oral Oral    Resp: 18 20    Height:      Weight:  70.035 kg (154 lb 6.4 oz)    SpO2: 95% 100% 95% 95%    Intake/Output Summary (Last 24 hours) at 11/25/15 1054 Last data filed at 11/25/15 [id number]  Gross per 24 hour  Intake   2490 ml  Output    450 ml  Net   2040 ml   Filed Weights   11/23/15 0615 11/24/15 0311 11/25/15 0739  Weight: 68.357 kg (150 lb 11.2 oz) 67.586 kg (149 lb) 70.035 kg (154 lb 6.4 oz)    Exam:   General:  Awake, frail, in nad, picc line on right arm  Cardiovascular: paced rhythm  Respiratory: diminished, occasionally rhonchi, no wheezing  Abdomen: soft, nondistended, pos BS  Musculoskeletal: perfused, no clubbing, no cyanosis  Data  Reviewed: Basic Metabolic Panel:  Recent Labs Lab 11/19/15 0440  11/21/15 0517 11/22/15 0447 11/23/15 0410 11/24/15 0422 11/25/15 0429  NA 146*  < > 143 144 142 144 145  K 3.8  < > 3.1* 3.3* 3.6 3.5 4.0  CL 102  < > 97* 103 104 105 104  CO2 32  < > 33* 34* 32 32 32  GLUCOSE 100*  < > 166* 109* 148* 129* 111*  BUN 47*  < > 46* 41* 37* 29* 34*  CREATININE 1.95*  < > 2.13* 2.01* 1.78* 1.75* 1.84*  CALCIUM 8.9  < > 8.5* 8.3* 8.4* 8.6* 8.9  MG  2.2  --   --   --   --   --   --   PHOS 4.1  --   --   --   --   --   --   < > = values in this interval not displayed. Liver Function Tests: No results for input(s): AST, ALT, ALKPHOS, BILITOT, PROT, ALBUMIN in the last 168 hours. No results for input(s): LIPASE, AMYLASE in the last 168 hours. No results for input(s): AMMONIA in the last 168 hours. CBC:  Recent Labs Lab 11/21/15 0517 11/22/15 0447 11/23/15 0410 11/24/15 0422 11/25/15 0429  WBC 20.7* 19.8* 19.3* 19.3* 16.3*  HGB 8.5* 8.0* 8.2* 8.0* 7.8*  HCT 28.0* 26.7* 27.8* 27.5* 26.6*  MCV 89.5 89.9 90.8 90.8 90.8  PLT 93* 89* 100* 108* 106*   Cardiac Enzymes: No results for input(s): CKTOTAL, CKMB, CKMBINDEX, TROPONINI in the last 168 hours. BNP (last 3 results)  Recent Labs  11/10/15 1456 11/16/15 0353  BNP 1792.8* 406.2*    ProBNP (last 3 results) No results for input(s): PROBNP in the last 8760 hours.  CBG:  Recent Labs Lab 11/24/15 0812 11/24/15 1132 11/24/15 1644 11/24/15 2325 11/25/15 0636  GLUCAP 112* 131* 159* 97 105*    Recent Results (from the past 240 hour(s))  Culture, blood (Routine X 2) w Reflex to ID Panel     Status: None   Collection Time: 11/16/15 12:02 PM  Result Value Ref Range Status   Specimen Description BLOOD LEFT ANTECUBITAL  Final   Special Requests BOTTLES DRAWN AEROBIC AND ANAEROBIC 5CC  Final   Culture NO GROWTH 5 DAYS  Final   Report Status 11/21/2015 FINAL  Final  Culture, blood (Routine X 2) w Reflex to ID Panel      Status: None   Collection Time: 11/16/15  2:21 PM  Result Value Ref Range Status   Specimen Description BLOOD LEFT ANTECUBITAL  Final   Special Requests BOTTLES DRAWN AEROBIC AND ANAEROBIC [place]  Final   Culture NO GROWTH 5 DAYS  Final   Report Status 11/21/2015 FINAL  Final     Studies: No results found.  Scheduled Meds: . amiodarone  200 mg Oral Daily  . antiseptic oral rinse  7 mL Mouth Rinse QID  . arformoterol  15 mcg Nebulization BID  . budesonide (PULMICORT) nebulizer solution  0.25 mg Nebulization BID  . chlorhexidine gluconate  15 mL Mouth Rinse BID  . donepezil  5 mg Oral QHS  . feeding supplement (PRO-STAT SUGAR FREE 64)  30 mL Oral BID  . furosemide  40 mg Intravenous Daily  . insulin aspart  0-15 Units Subcutaneous TID WC  . multivitamin with minerals  1 tablet Oral Daily  . omeprazole  40 mg Per Tube Daily  . rivaroxaban  15 mg Oral Q supper  . simvastatin  10 mg Oral q1800  . tamsulosin  0.4 mg Oral Daily   Continuous Infusions: . sodium chloride    . sodium chloride 10 mL/hr at 11/17/15 1900    Active Problems:   Acute systolic heart failure (HCC)   Sepsis due to pneumonia ([place])   Acute encephalopathy   Influenza with pneumonia   Hypotension   Protein-calorie malnutrition, severe ([place])   Cardiac arrest ([place])   Polymorphic ventricular tachycardia (HCC)   Pressure ulcer   Encounter for imaging study to confirm orogastric (OG) tube placement   Endotracheally intubated   Bacteremia due to Streptococcus   Cloy Cozzens MD PhD  [facility] Pager [phone]. If 7PM-7AM, please contact night-coverage at [email or web address], password [email or web address] 11/25/2015, 10:54 AM  LOS: 14 days

## 2015-11-25 NOTE — Progress Notes (Signed)
Pt a/o, HOH, c/o pain PRN pain meds given as ordered, pt weaned down to 1 L O2 94% pt taken off O2 and O2 was 95% on room air, after pt got oob to chair pt O2 85% on room air and pt was SOB put back on 1L and O2 95%

## 2015-11-25 NOTE — Clinical Social Work Note (Signed)
Patient's sister expressed preference for patient to discharge to [facility] once medically stable for discharge. CSW confirmed with [facility] admissions liaison that patient is able to be admitted once medically stable for discharge.  [facility] Medicare SNF authorization received on 11/25/2015: [id number] Next review date: 11/27/2015 RUG level: RVB  Patient will be transported via EMS at time of discharge.  Patient's RN confirmed signed DNR completed and on patient's chart.  Lubertha Sayres, LCSW [phone] Orthopedics: 5N1-16 Surgical: 6N1-16

## 2015-11-26 ENCOUNTER — Inpatient Hospital Stay (HOSPITAL_COMMUNITY): Payer: Medicare Other

## 2015-11-26 DIAGNOSIS — J90 Pleural effusion, not elsewhere classified: Secondary | ICD-10-CM

## 2015-11-26 LAB — GLUCOSE, CAPILLARY
GLUCOSE-CAPILLARY: 87 mg/dL (ref 65–99)
GLUCOSE-CAPILLARY: 79 mg/dL (ref 65–99)
Glucose-Capillary: 180 mg/dL — ABNORMAL HIGH (ref 65–99)
Glucose-Capillary: 107 mg/dL — ABNORMAL HIGH (ref 65–99)

## 2015-11-26 LAB — RETICULOCYTES
RBC.: 2.83 MIL/uL — AB (ref 4.22–5.81)
RETIC CT PCT: 2.6 % (ref 0.4–3.1)
Retic Count, Absolute: 73.6 10*3/uL (ref 19.0–186.0)

## 2015-11-26 LAB — IRON AND TIBC
Iron: 56 ug/dL (ref 45–182)
SATURATION RATIOS: 23 % (ref 17.9–39.5)
TIBC: 248 ug/dL — AB (ref 250–450)
UIBC: 192 ug/dL

## 2015-11-26 LAB — BASIC METABOLIC PANEL
Anion gap: 7 (ref 5–15)
BUN: 31 mg/dL — AB (ref 6–20)
CHLORIDE: 105 mmol/L (ref 101–111)
CO2: 33 mmol/L — ABNORMAL HIGH (ref 22–32)
CREATININE: 1.64 mg/dL — AB (ref 0.61–1.24)
Calcium: 9 mg/dL (ref 8.9–10.3)
GFR calc Af Amer: 41 mL/min — ABNORMAL LOW (ref >60)
GFR, EST NON AFRICAN AMERICAN: 36 mL/min — AB (ref >60)
GLUCOSE: 99 mg/dL (ref 65–99)
Potassium: 4 mmol/L (ref 3.5–5.1)
SODIUM: 145 mmol/L (ref 135–145)

## 2015-11-26 LAB — CBC
HCT: 26 % — ABNORMAL LOW (ref 39.0–52.0)
Hemoglobin: 7.5 g/dL — ABNORMAL LOW (ref 13.0–17.0)
MCH: 26.5 pg (ref 26.0–34.0)
MCHC: 28.8 g/dL — AB (ref 30.0–36.0)
MCV: 91.9 fL (ref 78.0–100.0)
PLATELETS: 117 10*3/uL — AB (ref 150–400)
RBC: 2.83 MIL/uL — ABNORMAL LOW (ref 4.22–5.81)
RDW: 19.7 % — ABNORMAL HIGH (ref 11.5–15.5)
WBC: 17.6 10*3/uL — ABNORMAL HIGH (ref 4.0–10.5)

## 2015-11-26 LAB — TROPONIN I
TROPONIN I: 0.05 ng/mL — AB (ref <0.031)
TROPONIN I: 0.06 ng/mL — AB (ref <0.031)

## 2015-11-26 LAB — PREPARE RBC (CROSSMATCH)

## 2015-11-26 LAB — VITAMIN B12: Vitamin B-12: 1311 pg/mL — ABNORMAL HIGH (ref 180–914)

## 2015-11-26 LAB — ABO/RH: ABO/RH(D): A POS

## 2015-11-26 MED ORDER — OMEPRAZOLE 20 MG PO CPDR
40.0000 mg | DELAYED_RELEASE_CAPSULE | Freq: Every day | ORAL | Status: DC
  Administered 2015-11-26 – 2015-12-01 (×5): 40 mg via ORAL
  Filled 2015-11-26 (×13): qty 2

## 2015-11-26 MED ORDER — SODIUM CHLORIDE 0.9 % IV SOLN
Freq: Once | INTRAVENOUS | Status: AC
  Administered 2015-11-26: 11:00:00 via INTRAVENOUS

## 2015-11-26 MED ORDER — FUROSEMIDE 10 MG/ML IJ SOLN
40.0000 mg | Freq: Two times a day (BID) | INTRAMUSCULAR | Status: DC
  Administered 2015-11-26 – 2015-11-27 (×2): 40 mg via INTRAVENOUS
  Filled 2015-11-26 (×2): qty 4

## 2015-11-26 MED ORDER — SENNOSIDES-DOCUSATE SODIUM 8.6-50 MG PO TABS
2.0000 | ORAL_TABLET | Freq: Two times a day (BID) | ORAL | Status: DC
  Administered 2015-11-26 – 2015-12-01 (×11): 2 via ORAL
  Filled 2015-11-26 (×11): qty 2

## 2015-11-26 NOTE — Progress Notes (Signed)
Physical Therapy Treatment Patient Details Name: [name] [name] MRN: [id number]:[id number] DOB: 23-May-1926 Today's Date: 11/26/2015    History of Present Illness presented with fever and dyspnea. r/o sepsis. +flu and CHF exacerbation PMHx-HIV, Hepatitis C, CAD, CHF, afib, dementia, COPD. Pt s/p cardiac arrest 3/10 with CPR for 8 mins requiring intubation 3/10-3/15.     PT Comments    Patient progressing very slowly due to limited tolerance to ambulation with decreased oxygen saturation with ambulation on 3L.  Feel continued skilled PT in SNF setting will assist with decreased endurance and strength.  Follow Up Recommendations  SNF;Supervision/Assistance - 24 hour     Equipment Recommendations  Rolling walker with 5" wheels    Recommendations for Other Services       Precautions / Restrictions Precautions Precautions: Fall Precaution Comments: Multiple falls PTA. Watch 02 sats, chest sore from CPR    Mobility  Bed Mobility Overal bed mobility: Needs Assistance       Supine to sit: Min assist;HOB elevated     General bed mobility comments: assist for lifting trunk and scooting to EOB  Transfers Overall transfer level: Needs assistance Equipment used: Rolling walker (2 wheeled) Transfers: Sit to/from Stand Sit to Stand: Min assist;+2 safety/equipment         General transfer comment: lifting assist to stand from bed  Ambulation/Gait Ambulation/Gait assistance: Min assist;Mod assist Ambulation Distance (Feet): 8 Feet (and 6') Assistive device: Rolling walker (2 wheeled) Gait Pattern/deviations: Step-through pattern;Trunk flexed;Decreased stride length;Shuffle     General Gait Details: took few steps then requested to use BSC, after toileting stood and walked to door with min/mod A and increased time cues for stepping into walker.     Stairs            Wheelchair Mobility    Modified Rankin (Stroke Patients Only)       Balance Overall balance assessment: Needs  assistance Sitting-balance support: Feet supported Sitting balance-Leahy Scale: Fair     Standing balance support: Bilateral upper extremity supported Standing balance-Leahy Scale: Poor Standing balance comment: UE support and assist for balance                    Cognition Arousal/Alertness: Awake/alert Behavior During Therapy: WFL for tasks assessed/performed Overall Cognitive Status: No family/caregiver present to determine baseline cognitive functioning                      Exercises      General Comments General comments (skin integrity, edema, etc.): SpO2 initially reading low 80's after ambulation, but repositioned to another finger due to poor reading, then up to 96%      Pertinent Vitals/Pain Faces Pain Scale: Hurts a little bit Pain Location: buttocks Pain Descriptors / Indicators: Sore Pain Intervention(s): Monitored during session;Repositioned    Home Living                      Prior Function            PT Goals (current goals can now be found in the care plan section) Progress towards PT goals: Progressing toward goals (limited)    Frequency  Min 3X/week    PT Plan Current plan remains appropriate    Co-evaluation             End of Session Equipment Utilized During Treatment: Gait belt;Oxygen Activity Tolerance: Patient limited by fatigue Patient left: with call bell/phone within reach;in chair;with chair alarm set  Time: 1003-1030 PT Time Calculation (min) (ACUTE ONLY): 27 min  Charges:  $Gait Training: 8-22 mins $Therapeutic Activity: 8-22 mins                    G Codes:      Reginia Naas 06-Dec-2015, 12:45 PM  Magda Kiel, [place] 2015-12-06

## 2015-11-26 NOTE — Progress Notes (Signed)
[facility] PROGRESS NOTE  [name] [name] [id number] DOB: Oct 23, 1925 DOA: 11/10/2015 PCP: Odette Fraction, MD  HPI/Brief narrative Please see admission H&P from 11/10/2015. Briefly the patient is an 82 year old male with a history of COPD, CAD status post CABG, A. fib, dementia who was initially admitted with acute proxy make rest 3 failure secondary to influenza a. During the course, the patient was noted to have worsening respiratory distress. He later had nonsustained V. tach and later found to be unresponsive and pulseless. Patient was intubated and transferred to ICU. Patient since been successfully extubated on 11/18/2015 and care transferred to the hospitalist service  Assessment/Plan: 1. Acute hypoxemic respiratory failure/vfib arrest on 3/10/CHF/pleural effusion 1. Successfully extubated on 3/15. Stable at present 2. Patient on scheduled IV lasix given CXR findings worrisome for pulm edema that appears improved on follow up CXR 3. Due to persistent oxygen requirment, repeat cxr on 3/23 showed worsening of pleural effusion, will get ct chest. Increase lasix, patient c/o left sided chest pain and sob on 3/23 am, cycle troponin unremarkable, bp stable, but chest ct did review bilateral pleural effusion, right >left, IR consulted for US guided thoracentesis, per phone conversation with Dr Anselm Pancoast, no need to be npo, no need to hold xarelto. I have also called patient's sister and updated her over the phone at [phone] 2. NSVT 1. Pt continued on amiodarone, transition to PO 2. Remains stable 3. Hx afib and heart block s/p pacemaker 1. Rate controlled, paced rhythm 2. CHADS-VASc 2, previously on xarelto. Had remained on heparin gtt. 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 Baseline Cr around 1.2 2. Cr had remained in the 1.5-1.7 range, suspect secondary to end-organ damage from shock vs sepsis 3. Cr had improved  with gentle IVF, now stopped since goal is continued diuresis 5. Thrombocytopenia 1. Possibly related to heparin vs end-organ damage as plts noted to be 51k on day of admit 2. improving 6. Flu A with sepsis present on admission, treated 7. Viridians strep bacteremia 1. Had been on empiric vanc and rocephin. Now on rocephin alone based on sensitivities, TEE on 3/15 no vegetation  2. Discussed with ID. Recs for 10 days of tx. Completed course of abx on 3/20 (14 days total) 3. Remains afebrile, repeat blood culture no growth 8. Anemia; normocytic, close to baseline, likely anemia of chronic disease, check FOBT. Will transfuse 1 prbc on 3/23 due to hgb trending down 9. Leukocytosis: better 10. Acute on chronic systolic CHF , ef [id number], increase lasix, not on betablocker due to h/o mobitz second-degree heart block, but no s/p pacemaker, not on acei due to elevated  Cr, cardiology consulted. 11. Dementia 1. stable 12. DVT prophylaxis 1. Transition to xarelto  Code Status: DNR, code status was discussed with family by icu attending Family Communication: Pt in room, sister over the phone on 3/23 Disposition Plan: pleural effusion, needing thoracentesis, cards reconsulted  Consultants:  Critical Care  Cardiology  IR  Procedures:  Intubation 3/10 and extubation 3/15  Code blue, CPR, defibrillation on 3/10  picc line placement  TEE 3/15  Thoracentesis for pleural effusion on 3/24  Antibiotics: Anti-infectives    Start     Dose/Rate Route Frequency Ordered Stop   11/16/15 1545  vancomycin (VANCOCIN) IVPB 750 mg/150 ml premix  Status:  Discontinued     750 mg 150 mL/hr over 60 Minutes Intravenous Every 24 hours 11/16/15 1543 11/18/15 1503  11/15/15 0000  vancomycin (VANCOCIN) 1,250 mg in sodium chloride 0.9 % 250 mL IVPB  Status:  Discontinued     1,250 mg 166.7 mL/hr over 90 Minutes Intravenous Every 24 hours 11/14/15 0653 11/15/15 1527   11/13/15 2345  cefTRIAXone (ROCEPHIN) 2  g in dextrose 5 % 50 mL IVPB  Status:  Discontinued     2 g 100 mL/hr over 30 Minutes Intravenous Daily at bedtime 11/13/15 2327 11/23/15 1450   11/12/15 0900  cefTRIAXone (ROCEPHIN) 2 g in dextrose 5 % 50 mL IVPB  Status:  Discontinued     2 g 100 mL/hr over 30 Minutes Intravenous Every 24 hours 11/11/15 1323 11/11/15 1433   11/11/15 2200  oseltamivir (TAMIFLU) 6 MG/ML suspension 30 mg     30 mg Oral 2 times daily 11/11/15 1340 11/14/15 2152   11/11/15 1400  cefTRIAXone (ROCEPHIN) 1 g in dextrose 5 % 50 mL IVPB  Status:  Discontinued     1 g 100 mL/hr over 30 Minutes Intravenous  Once 11/11/15 1324 11/11/15 1433   11/11/15 1200  azithromycin (ZITHROMAX) 500 mg in dextrose 5 % 250 mL IVPB  Status:  Discontinued     500 mg 250 mL/hr over 60 Minutes Intravenous Every 24 hours 11/10/15 1318 11/11/15 1433   11/11/15 0900  cefTRIAXone (ROCEPHIN) 1 g in dextrose 5 % 50 mL IVPB  Status:  Discontinued     1 g 100 mL/hr over 30 Minutes Intravenous Every 24 hours 11/10/15 1318 11/11/15 1323   11/11/15 0600  vancomycin (VANCOCIN) IVPB 1000 mg/200 mL premix  Status:  Discontinued     1,000 mg 200 mL/hr over 60 Minutes Intravenous Every 24 hours 11/11/15 0528 11/14/15 0653   11/10/15 1500  oseltamivir (TAMIFLU) 6 MG/ML suspension 75 mg  Status:  Discontinued     75 mg Oral 2 times daily 11/10/15 1448 11/11/15 1340   11/10/15 1130  azithromycin (ZITHROMAX) 500 mg in dextrose 5 % 250 mL IVPB     500 mg 250 mL/hr over 60 Minutes Intravenous  Once 11/10/15 1127 11/10/15 1319   11/10/15 0830  cefTRIAXone (ROCEPHIN) 1 g in dextrose 5 % 50 mL IVPB     1 g 100 mL/hr over 30 Minutes Intravenous  Once 11/10/15 0828 11/10/15 1222      HPI/Subjective: C/o left sided chest pain, feeling sob, no cough, chest wall tenderness, baseline dementia, not oriented to time  Objective: Filed Vitals:   11/25/15 2039 11/25/15 2324 11/26/15 0424 11/26/15 0841  BP:  118/45 129/46   Pulse:  64 60   Temp:  98 F (36.7  C) 98.4 F (36.9 C)   TempSrc:  Oral Oral   Resp:  18 18   Height:      Weight:   68.765 kg (151 lb 9.6 oz)   SpO2: 96% 99% 99% 94%    Intake/Output Summary (Last 24 hours) at 11/26/15 1107 Last data filed at 11/26/15 0900  Gross per 24 hour  Intake    740 ml  Output    675 ml  Net     65 ml   Filed Weights   11/24/15 0311 11/25/15 0739 11/26/15 0424  Weight: 67.586 kg (149 lb) 70.035 kg (154 lb 6.4 oz) 68.765 kg (151 lb 9.6 oz)    Exam:   General:  Awake, frail, in nad, picc line on right arm  Cardiovascular: paced rhythm  Respiratory: diminished, occasionally rhonchi, no wheezing  Abdomen: soft, nondistended, pos BS  Musculoskeletal:  perfused, no clubbing, no cyanosis, no edema  Data Reviewed: Basic Metabolic Panel:  Recent Labs Lab 11/22/15 0447 11/23/15 0410 11/24/15 0422 11/25/15 0429 11/26/15 0520  NA 144 142 144 145 145  K 3.3* 3.6 3.5 4.0 4.0  CL 103 104 105 104 105  CO2 34* 32 32 32 33*  GLUCOSE 109* 148* 129* 111* 99  BUN 41* 37* 29* 34* 31*  CREATININE 2.01* 1.78* 1.75* 1.84* 1.64*  CALCIUM 8.3* 8.4* 8.6* 8.9 9.0   Liver Function Tests: No results for input(s): AST, ALT, ALKPHOS, BILITOT, PROT, ALBUMIN in the last 168 hours. No results for input(s): LIPASE, AMYLASE in the last 168 hours. No results for input(s): AMMONIA in the last 168 hours. CBC:  Recent Labs Lab 11/22/15 0447 11/23/15 0410 11/24/15 0422 11/25/15 0429 11/26/15 0520  WBC 19.8* 19.3* 19.3* 16.3* 17.6*  HGB 8.0* 8.2* 8.0* 7.8* 7.5*  HCT 26.7* 27.8* 27.5* 26.6* 26.0*  MCV 89.9 90.8 90.8 90.8 91.9  PLT 89* 100* 108* 106* 117*   Cardiac Enzymes: No results for input(s): CKTOTAL, CKMB, CKMBINDEX, TROPONINI in the last 168 hours. BNP (last 3 results)  Recent Labs  11/10/15 1456 11/16/15 0353  BNP 1792.8* 406.2*    ProBNP (last 3 results) No results for input(s): PROBNP in the last 8760 hours.  CBG:  Recent Labs Lab 11/25/15 0636 11/25/15 1127  11/25/15 1730 11/25/15 2319 11/26/15 0651  GLUCAP 105* 138* 125* 83 87    Recent Results (from the past 240 hour(s))  Culture, blood (Routine X 2) w Reflex to ID Panel     Status: None   Collection Time: 11/16/15 12:02 PM  Result Value Ref Range Status   Specimen Description BLOOD LEFT ANTECUBITAL  Final   Special Requests BOTTLES DRAWN AEROBIC AND ANAEROBIC 5CC  Final   Culture NO GROWTH 5 DAYS  Final   Report Status 11/21/2015 FINAL  Final  Culture, blood (Routine X 2) w Reflex to ID Panel     Status: None   Collection Time: 11/16/15  2:21 PM  Result Value Ref Range Status   Specimen Description BLOOD LEFT ANTECUBITAL  Final   Special Requests BOTTLES DRAWN AEROBIC AND ANAEROBIC [facility]  Final   Culture NO GROWTH 5 DAYS  Final   Report Status 11/21/2015 FINAL  Final     Studies: Dg Chest 2 View  11/26/2015  CLINICAL DATA:  Short of breath.  Week. EXAM: CHEST  2 VIEW COMPARISON:  11/20/2015 FINDINGS: Right PICC stable. Do lead left subclavian pacemaker and leads are stable. Bilateral pleural effusions have increased. Right pleural effusion is larger than the left. Associated dependent atelectasis in lower lobes is associated. Acute left sixth rib fracture is now identified. It is minimally displaced. There is also deformity of the posterior and upper left third, fourth, and fifth ribs. IMPRESSION: Increasing bilateral pleural effusions. New left-sided rib fractures. At least 1 of them, the sixth, has an acute appearance. Electronically Signed   By: Marybelle Killings M.D.   On: 11/26/2015 07:47    Scheduled Meds: . sodium chloride   Intravenous Once  . amiodarone  200 mg Oral Daily  . antiseptic oral rinse  7 mL Mouth Rinse QID  . arformoterol  15 mcg Nebulization BID  . budesonide (PULMICORT) nebulizer solution  0.25 mg Nebulization BID  . chlorhexidine gluconate  15 mL Mouth Rinse BID  . donepezil  5 mg Oral QHS  . feeding supplement (PRO-STAT SUGAR FREE 64)  30 mL Oral BID  .  furosemide  40 mg Intravenous BID  . insulin aspart  0-15 Units Subcutaneous TID WC  . multivitamin with minerals  1 tablet Oral Daily  . omeprazole  40 mg Oral Daily  . rivaroxaban  15 mg Oral Q supper  . senna-docusate  2 tablet Oral BID  . simvastatin  10 mg Oral q1800  . tamsulosin  0.4 mg Oral Daily   Continuous Infusions: . sodium chloride    . sodium chloride 10 mL/hr at 11/17/15 1900    Active Problems:   Acute systolic heart failure (HCC)   Sepsis due to pneumonia ([place])   Acute encephalopathy   Influenza with pneumonia   Hypotension   Protein-calorie malnutrition, severe ([place])   Cardiac arrest ([place])   Polymorphic ventricular tachycardia (HCC)   Pressure ulcer   Encounter for imaging study to confirm orogastric (OG) tube placement   Endotracheally intubated   Bacteremia due to Streptococcus   Time >17mins  Verna Hamon MD PhD  [facility] Pager [phone]. If 7PM-7AM, please contact night-coverage at [email or web address], password [facility] 11/26/2015, 11:07 AM  LOS: 15 days

## 2015-11-27 ENCOUNTER — Inpatient Hospital Stay (HOSPITAL_COMMUNITY): Payer: Medicare Other

## 2015-11-27 DIAGNOSIS — J90 Pleural effusion, not elsewhere classified: Secondary | ICD-10-CM | POA: Insufficient documentation

## 2015-11-27 DIAGNOSIS — I509 Heart failure, unspecified: Secondary | ICD-10-CM

## 2015-11-27 DIAGNOSIS — J948 Other specified pleural conditions: Secondary | ICD-10-CM

## 2015-11-27 DIAGNOSIS — I5023 Acute on chronic systolic (congestive) heart failure: Secondary | ICD-10-CM | POA: Insufficient documentation

## 2015-11-27 LAB — CBC WITH DIFFERENTIAL/PLATELET
BASOS ABS: 0 10*3/uL (ref 0.0–0.1)
Basophils Relative: 0 %
EOS ABS: 0.2 10*3/uL (ref 0.0–0.7)
EOS PCT: 1 %
HEMATOCRIT: 28.2 % — AB (ref 39.0–52.0)
Hemoglobin: 8.3 g/dL — ABNORMAL LOW (ref 13.0–17.0)
Lymphocytes Relative: 75 %
Lymphs Abs: 11.4 10*3/uL — ABNORMAL HIGH (ref 0.7–4.0)
MCH: 26.8 pg (ref 26.0–34.0)
MCHC: 29.4 g/dL — AB (ref 30.0–36.0)
MCV: 91 fL (ref 78.0–100.0)
MONO ABS: 0.3 10*3/uL (ref 0.1–1.0)
Monocytes Relative: 2 %
NEUTROS PCT: 22 %
Neutro Abs: 3.4 10*3/uL (ref 1.7–7.7)
PLATELETS: 114 10*3/uL — AB (ref 150–400)
RBC: 3.1 MIL/uL — AB (ref 4.22–5.81)
RDW: 19.9 % — AB (ref 11.5–15.5)
WBC: 15.3 10*3/uL — AB (ref 4.0–10.5)

## 2015-11-27 LAB — GRAM STAIN

## 2015-11-27 LAB — LACTATE DEHYDROGENASE, PLEURAL OR PERITONEAL FLUID: LD, Fluid: 173 U/L — ABNORMAL HIGH (ref 3–23)

## 2015-11-27 LAB — ECHOCARDIOGRAM LIMITED
Height: 71 in
WEIGHTICAEL: 2411.2 [oz_av]

## 2015-11-27 LAB — GLUCOSE, SEROUS FLUID: Glucose, Fluid: 178 mg/dL

## 2015-11-27 LAB — GLUCOSE, CAPILLARY
GLUCOSE-CAPILLARY: 100 mg/dL — AB (ref 65–99)
Glucose-Capillary: 123 mg/dL — ABNORMAL HIGH (ref 65–99)
Glucose-Capillary: 184 mg/dL — ABNORMAL HIGH (ref 65–99)
Glucose-Capillary: 95 mg/dL (ref 65–99)

## 2015-11-27 LAB — TYPE AND SCREEN
ABO/RH(D): A POS
Antibody Screen: NEGATIVE
UNIT DIVISION: 0

## 2015-11-27 LAB — BASIC METABOLIC PANEL
ANION GAP: 5 (ref 5–15)
BUN: 30 mg/dL — ABNORMAL HIGH (ref 6–20)
CALCIUM: 8.8 mg/dL — AB (ref 8.9–10.3)
CO2: 33 mmol/L — ABNORMAL HIGH (ref 22–32)
CREATININE: 1.48 mg/dL — AB (ref 0.61–1.24)
Chloride: 107 mmol/L (ref 101–111)
GFR, EST AFRICAN AMERICAN: 47 mL/min — AB (ref >60)
GFR, EST NON AFRICAN AMERICAN: 40 mL/min — AB (ref >60)
Glucose, Bld: 106 mg/dL — ABNORMAL HIGH (ref 65–99)
Potassium: 4.1 mmol/L (ref 3.5–5.1)
SODIUM: 145 mmol/L (ref 135–145)

## 2015-11-27 LAB — BODY FLUID CELL COUNT WITH DIFFERENTIAL
Eos, Fluid: 0 %
LYMPHS FL: 57 %
MONOCYTE-MACROPHAGE-SEROUS FLUID: 13 % — AB (ref 50–90)
Neutrophil Count, Fluid: 30 % — ABNORMAL HIGH (ref 0–25)
WBC FLUID: 81 uL (ref 0–1000)

## 2015-11-27 LAB — FOLATE RBC
FOLATE, RBC: 2246 ng/mL (ref >498)
Folate, Hemolysate: 595.2 ng/mL
Hematocrit: 26.5 % — ABNORMAL LOW (ref 37.5–51.0)

## 2015-11-27 LAB — HEMOGLOBIN A1C
HEMOGLOBIN A1C: 6.2 % — AB (ref 4.8–5.6)
MEAN PLASMA GLUCOSE: 131 mg/dL

## 2015-11-27 LAB — PROTEIN, BODY FLUID: Total protein, fluid: <3 g/dL

## 2015-11-27 LAB — TROPONIN I: TROPONIN I: 0.06 ng/mL — AB (ref <0.031)

## 2015-11-27 MED ORDER — FUROSEMIDE 10 MG/ML IJ SOLN
80.0000 mg | Freq: Two times a day (BID) | INTRAMUSCULAR | Status: DC
  Administered 2015-11-27: 80 mg via INTRAVENOUS
  Filled 2015-11-27 (×2): qty 8

## 2015-11-27 MED ORDER — SODIUM CHLORIDE 0.9 % IV SOLN
INTRAVENOUS | Status: DC | PRN
  Administered 2015-11-27: 11:00:00 via INTRAVENOUS

## 2015-11-27 MED ORDER — LIDOCAINE HCL (PF) 1 % IJ SOLN
INTRAMUSCULAR | Status: AC
  Filled 2015-11-27: qty 10

## 2015-11-27 MED ORDER — POLYETHYLENE GLYCOL 3350 17 G PO PACK
17.0000 g | PACK | Freq: Every day | ORAL | Status: DC
  Administered 2015-11-28 – 2015-11-29 (×2): 17 g via ORAL
  Filled 2015-11-27 (×2): qty 1

## 2015-11-27 NOTE — Progress Notes (Signed)
  Echocardiogram Limited 2D Echocardiogram has been performed.  11/27/2015 4:47 PM Maudry Mayhew, RVT, RDCS, RDMS

## 2015-11-27 NOTE — Discharge Instructions (Signed)

## 2015-11-27 NOTE — Progress Notes (Signed)
Physical Therapy Treatment Patient Details Name: [name] [name] MRN: [place]:[id number] DOB: [birth year] Today's Date: 11/27/2015    History of Present Illness presented with fever and dyspnea. r/o sepsis. +flu and CHF exacerbation PMHx-HIV, Hepatitis C, CAD, CHF, afib, dementia, COPD. Pt s/p cardiac arrest 3/10 with CPR for 8 mins requiring intubation 3/10-3/15.     PT Comments    Pt on 3 lts O2 sats 96% with 1/4 DOE at rest.  Assisted OOB with increased time and extended sitting rest break EOB.  Remained on 3 lts.  Noted congestion with inability to cough due to c/o chest pain (CPR compressions).  Amb a limited distance then positioned in recliner when respiratory arrived.     Follow Up Recommendations  SNF     Equipment Recommendations       Recommendations for Other Services       Precautions / Restrictions Precautions Precautions: Fall Precaution Comments: Multiple falls PTA. Watch 02 sats, chest sore from CPR Restrictions Weight Bearing Restrictions: No    Mobility  Bed Mobility Overal bed mobility: Needs Assistance       Supine to sit: Min assist;Mod assist     General bed mobility comments: assist for lifting trunk and scooting to EOB with increased time  Transfers Overall transfer level: Needs assistance Equipment used: Rolling walker (2 wheeled) Transfers: Sit to/from Stand Sit to Stand: Min assist         General transfer comment: lifting assist to stand from bed and 25% VC's on proper hand placement to push off vs pull self up on walker  Ambulation/Gait Ambulation/Gait assistance: Min assist;Mod assist Ambulation Distance (Feet): 12 Feet Assistive device: Rolling walker (2 wheeled) Gait Pattern/deviations: Step-to pattern;Step-through pattern;Decreased step length - right;Decreased step length - left;Trunk flexed;Narrow base of support Gait velocity: decreased   General Gait Details: very limited activity tolerance with noted 3/4 DOE.  Remained on 3 lts  sats 96%.     Stairs            Wheelchair Mobility    Modified Rankin (Stroke Patients Only)       Balance                                    Cognition Arousal/Alertness: Awake/alert Behavior During Therapy: WFL for tasks assessed/performed Overall Cognitive Status: No family/caregiver present to determine baseline cognitive functioning                      Exercises      General Comments        Pertinent Vitals/Pain Pain Assessment: Faces Faces Pain Scale: Hurts little more Pain Location: chest from CPR compressions Pain Descriptors / Indicators: Sore Pain Intervention(s): Monitored during session;Repositioned    Home Living                      Prior Function            PT Goals (current goals can now be found in the care plan section) Progress towards PT goals: Progressing toward goals    Frequency  Min 3X/week    PT Plan Current plan remains appropriate    Co-evaluation             End of Session Equipment Utilized During Treatment: Gait belt;Oxygen Activity Tolerance: Patient limited by fatigue;Other (comment) (CHF)       Time: [id number] PT Time  Calculation (min) (ACUTE ONLY): 25 min  Charges:  $Gait Training: 8-22 mins $Therapeutic Activity: 8-22 mins                    G Codes:      Rica Koyanagi  PTA [facility]  Acute  Rehab Pager      [phone]

## 2015-11-27 NOTE — Progress Notes (Addendum)
Patient c/o chest pain,9 out of 10.  Skin warm and dry.  12 Lead EKG obtained and no changes.  Medicated with Morphine 2mg  IV @ 1655.  Text message sent to Dr. Erlinda Hong per above.

## 2015-11-27 NOTE — Procedures (Signed)
Successful US guided right thoracentesis. Yielded 1.6L of slightly hazy, blood tinged fluid. Pt tolerated procedure well. No immediate complications.  Specimen was sent for labs. CXR ordered.  Ascencion Dike PA-C 11/27/2015 2:48 PM

## 2015-11-27 NOTE — Progress Notes (Signed)
[facility] PROGRESS NOTE  [name] [name] YIR:[id number] DOB: 07-22-1926 DOA: 11/10/2015 PCP: Odette Fraction, MD  HPI/Brief narrative Please see admission H&P from 11/10/2015. Briefly the patient is an 82 year old male with a history of COPD, CAD status post CABG, A. fib, dementia who was initially admitted with acute proxy make rest 3 failure secondary to influenza a. During the course, the patient was noted to have worsening respiratory distress. He later had nonsustained V. tach and later found to be unresponsive and pulseless. Patient was intubated and transferred to ICU. Patient since been successfully extubated on 11/18/2015 and care transferred to the hospitalist service  Assessment/Plan: 1. Acute hypoxemic respiratory failure/vfib arrest on 3/10/CHF/pleural effusion 1. Successfully extubated on 3/15. Stable at present 2. Patient on scheduled IV lasix given CXR findings worrisome for pulm edema that appears improved on follow up CXR 3. Due to persistent oxygen requirment, repeat cxr on 3/23 showed worsening of pleural effusion, will get ct chest. Increase lasix, patient c/o left sided chest pain and sob on 3/23 am, cycle troponin unremarkable, bp stable, but chest ct did review bilateral pleural effusion, right >left, IR consulted for US guided thoracentesis, per phone conversation with Dr Anselm Pancoast, no need to be npo, no need to hold xarelto. I have also called patient's sister and updated her over the phone at [phone] 4. 3/24 1.6L pleural effusion removed 2. NSVT 1. Pt continued on amiodarone, transition to PO 2. Remains stable 3. Hx afib and heart block s/p pacemaker 1. Rate controlled, paced rhythm 2. CHADS-VASc 2, previously on xarelto. Had remained on heparin gtt. 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 Baseline Cr around 1.2 2. Cr had remained in the 1.5-1.7 range, suspect secondary to end-organ damage  from shock vs sepsis 3. Cr had improved with gentle IVF, now stopped since goal is continued diuresis 5. Thrombocytopenia 1. Possibly related to heparin vs end-organ damage as plts noted to be 51k on day of admit 2. improving 6. Flu A with sepsis present on admission, treated 7. Viridians strep bacteremia 1. Had been on empiric vanc and rocephin. Now on rocephin alone based on sensitivities, TEE on 3/15 no vegetation  2. Discussed with ID. Recs for 10 days of tx. Completed course of abx on 3/20 (14 days total) 3. Remains afebrile, repeat blood culture no growth 8. Anemia; normocytic, close to baseline, likely anemia of chronic disease, check FOBT. Will transfuse 1 prbc on 3/23 due to hgb trending down 9. Leukocytosis: better 10. Acute on chronic systolic CHF , ef 50-09%, increase lasix, not on betablocker due to h/o mobitz second-degree heart block, but no s/p pacemaker, not on acei due to elevated  Cr, cardiology consulted. 11. Dementia 1. stable 12. DVT prophylaxis 1. Transition to xarelto  Code Status: DNR, code status was discussed with family by icu attending Family Communication: Pt in room, sister over the phone on 3/23, daughter at bedside on 3/24 Disposition Plan: cath on Monday per cardiology  Consultants:  Critical Care  Cardiology  IR  Procedures:  Intubation 3/10 and extubation 3/15  Code blue, CPR, defibrillation on 3/10  picc line placement  TEE 3/15  Thoracentesis for pleural effusion on 3/24  Antibiotics: Anti-infectives    Start     Dose/Rate Route Frequency Ordered Stop   11/16/15 1545  vancomycin (VANCOCIN) IVPB 750 mg/150 ml premix  Status:  Discontinued     750 mg 150 mL/hr over 60 Minutes  Intravenous Every 24 hours 11/16/15 1543 11/18/15 1503   11/15/15 0000  vancomycin (VANCOCIN) 1,250 mg in sodium chloride 0.9 % 250 mL IVPB  Status:  Discontinued     1,250 mg 166.7 mL/hr over 90 Minutes Intravenous Every 24 hours 11/14/15 0653 11/15/15 1527    11/13/15 2345  cefTRIAXone (ROCEPHIN) 2 g in dextrose 5 % 50 mL IVPB  Status:  Discontinued     2 g 100 mL/hr over 30 Minutes Intravenous Daily at bedtime 11/13/15 2327 11/23/15 1450   11/12/15 0900  cefTRIAXone (ROCEPHIN) 2 g in dextrose 5 % 50 mL IVPB  Status:  Discontinued     2 g 100 mL/hr over 30 Minutes Intravenous Every 24 hours 11/11/15 1323 11/11/15 1433   11/11/15 2200  oseltamivir (TAMIFLU) 6 MG/ML suspension 30 mg     30 mg Oral 2 times daily 11/11/15 1340 11/14/15 2152   11/11/15 1400  cefTRIAXone (ROCEPHIN) 1 g in dextrose 5 % 50 mL IVPB  Status:  Discontinued     1 g 100 mL/hr over 30 Minutes Intravenous  Once 11/11/15 1324 11/11/15 1433   11/11/15 1200  azithromycin (ZITHROMAX) 500 mg in dextrose 5 % 250 mL IVPB  Status:  Discontinued     500 mg 250 mL/hr over 60 Minutes Intravenous Every 24 hours 11/10/15 1318 11/11/15 1433   11/11/15 0900  cefTRIAXone (ROCEPHIN) 1 g in dextrose 5 % 50 mL IVPB  Status:  Discontinued     1 g 100 mL/hr over 30 Minutes Intravenous Every 24 hours 11/10/15 1318 11/11/15 1323   11/11/15 0600  vancomycin (VANCOCIN) IVPB 1000 mg/200 mL premix  Status:  Discontinued     1,000 mg 200 mL/hr over 60 Minutes Intravenous Every 24 hours 11/11/15 0528 11/14/15 0653   11/10/15 1500  oseltamivir (TAMIFLU) 6 MG/ML suspension 75 mg  Status:  Discontinued     75 mg Oral 2 times daily 11/10/15 1448 11/11/15 1340   11/10/15 1130  azithromycin (ZITHROMAX) 500 mg in dextrose 5 % 250 mL IVPB     500 mg 250 mL/hr over 60 Minutes Intravenous  Once 11/10/15 1127 11/10/15 1319   11/10/15 0830  cefTRIAXone (ROCEPHIN) 1 g in dextrose 5 % 50 mL IVPB     1 g 100 mL/hr over 30 Minutes Intravenous  Once 11/10/15 0828 11/10/15 1222      HPI/Subjective: Still on 3liter oxygen,  feeling sob, no cough, chest wall tenderness, baseline dementia, not oriented to time, family in room  Objective: Filed Vitals:   11/26/15 2130 11/27/15 0251 11/27/15 1000 11/27/15 1152   BP: 127/48 109/51  136/83  Pulse: 68 68 85 66  Temp: 97.8 F (36.6 C) 97.9 F (36.6 C)  98 F (36.7 C)  TempSrc: Oral Oral  Oral  Resp: 18 20 22 20   Height:      Weight:  68.357 kg (150 lb 11.2 oz)    SpO2: 99% 97% 91% 90%    Intake/Output Summary (Last 24 hours) at 11/27/15 1224 Last data filed at 11/27/15 1101  Gross per 24 hour  Intake    340 ml  Output    625 ml  Net   -285 ml   Filed Weights   11/25/15 0739 11/26/15 0424 11/27/15 0251  Weight: 70.035 kg (154 lb 6.4 oz) 68.765 kg (151 lb 9.6 oz) 68.357 kg (150 lb 11.2 oz)    Exam:   General:  Awake, frail, in nad, picc line on right arm  Cardiovascular: paced  rhythm  Respiratory: diminished, occasionally rhonchi, no wheezing  Abdomen: soft, nondistended, pos BS  Musculoskeletal: perfused, no clubbing, no cyanosis, no edema  Data Reviewed: Basic Metabolic Panel:  Recent Labs Lab 11/23/15 0410 11/24/15 0422 11/25/15 0429 11/26/15 0520 11/27/15 0430  NA 142 144 145 145 145  K 3.6 3.5 4.0 4.0 4.1  CL 104 105 104 105 107  CO2 32 32 32 33* 33*  GLUCOSE 148* 129* 111* 99 106*  BUN 37* 29* 34* 31* 30*  CREATININE 1.78* 1.75* 1.84* 1.64* 1.48*  CALCIUM 8.4* 8.6* 8.9 9.0 8.8*   Liver Function Tests: No results for input(s): AST, ALT, ALKPHOS, BILITOT, PROT, ALBUMIN in the last 168 hours. No results for input(s): LIPASE, AMYLASE in the last 168 hours. No results for input(s): AMMONIA in the last 168 hours. CBC:  Recent Labs Lab 11/23/15 0410 11/24/15 0422 11/25/15 0429 11/26/15 0520 11/27/15 0430  WBC 19.3* 19.3* 16.3* 17.6* 15.3*  NEUTROABS  --   --   --   --  3.4  HGB 8.2* 8.0* 7.8* 7.5* 8.3*  HCT 27.8* 27.5* 26.6* 26.0* 28.2*  MCV 90.8 90.8 90.8 91.9 91.0  PLT 100* 108* 106* 117* 114*   Cardiac Enzymes:  Recent Labs Lab 11/26/15 1250 11/26/15 1827 11/26/15 2315  TROPONINI 0.05* 0.06* 0.06*   BNP (last 3 results)  Recent Labs  11/10/15 1456 11/16/15 0353  BNP 1792.8* 406.2*     ProBNP (last 3 results) No results for input(s): PROBNP in the last 8760 hours.  CBG:  Recent Labs Lab 11/26/15 0651 11/26/15 1124 11/26/15 1621 11/26/15 2201 11/27/15 0643  GLUCAP 87 180* 79 107* 100*    No results found for this or any previous visit (from the past 240 hour(s)).   Studies: Dg Chest 2 View  11/26/2015  CLINICAL DATA:  Short of breath.  Week. EXAM: CHEST  2 VIEW COMPARISON:  11/20/2015 FINDINGS: Right PICC stable. Do lead left subclavian pacemaker and leads are stable. Bilateral pleural effusions have increased. Right pleural effusion is larger than the left. Associated dependent atelectasis in lower lobes is associated. Acute left sixth rib fracture is now identified. It is minimally displaced. There is also deformity of the posterior and upper left third, fourth, and fifth ribs. IMPRESSION: Increasing bilateral pleural effusions. New left-sided rib fractures. At least 1 of them, the sixth, has an acute appearance. Electronically Signed   By: Marybelle Killings M.D.   On: 11/26/2015 07:47   Ct Chest Wo Contrast  11/26/2015  CLINICAL DATA:  Short of breath. EXAM: CT CHEST WITHOUT CONTRAST TECHNIQUE: Multidetector CT imaging of the chest was performed following the standard protocol without IV contrast. COMPARISON:  Chest x-ray 11/26/2015, chest CT 10/24/2012 FINDINGS: Moderate right pleural effusion with compressive atelectasis in the right lung base. Small left pleural effusion. COPD with mild emphysema. Advanced coronary calcification. Extensive aortic arch calcification. Cardiac enlargement with pacemaker. No pericardial effusion. Patchy density in the lingula similar to the prior study and likely due to scarring. Negative for pneumonia.  Negative for mass or adenopathy. Upper abdomen negative.  No acute skeletal abnormality. IMPRESSION: Bilateral pleural effusions right greater than left, possibly due to congestive heart failure. No edema is present. Cardiac enlargement  with advanced coronary calcification Mild lingular scarring. COPD. Electronically Signed   By: Franchot Gallo M.D.   On: 11/26/2015 13:59    Scheduled Meds: . amiodarone  200 mg Oral Daily  . antiseptic oral rinse  7 mL Mouth Rinse QID  .  arformoterol  15 mcg Nebulization BID  . budesonide (PULMICORT) nebulizer solution  0.25 mg Nebulization BID  . chlorhexidine gluconate (SAGE KIT)  15 mL Mouth Rinse BID  . donepezil  5 mg Oral QHS  . feeding supplement (PRO-STAT SUGAR FREE 64)  30 mL Oral BID  . furosemide  40 mg Intravenous BID  . insulin aspart  0-15 Units Subcutaneous TID WC  . multivitamin with minerals  1 tablet Oral Daily  . omeprazole  40 mg Oral Daily  . rivaroxaban  15 mg Oral Q supper  . senna-docusate  2 tablet Oral BID  . simvastatin  10 mg Oral q1800  . tamsulosin  0.4 mg Oral Daily   Continuous Infusions:    Active Problems:   Acute systolic heart failure (HCC)   Sepsis due to pneumonia ([place])   Acute encephalopathy   Influenza with pneumonia   Hypotension   Protein-calorie malnutrition, severe ([place])   Cardiac arrest ([place])   Polymorphic ventricular tachycardia (HCC)   Pressure ulcer   Encounter for imaging study to confirm orogastric (OG) tube placement   Endotracheally intubated   Bacteremia due to Streptococcus   Time >71mns  Zarina Pe MD PhD  [facility] Pager [phone] If 7PM-7AM, please contact night-coverage at [email or web address], password [email or web address]3/24/2017, 12:24 PM  LOS: 16 days

## 2015-11-27 NOTE — Progress Notes (Addendum)
CSW continuing to follow for DC to SNF when stable- CSW submitted updated clinicals to [facility] to re-up authorization if pt is able to DC over the weekend  Per MD possible weekend DC- insurance approved for weekend DC if pt is stable  [place] updated- if pt DCs over weekend CSW to contact weekend staff Drue Novel [phone])  Domenica Reamer, [place] Worker [phone]

## 2015-11-27 NOTE — Progress Notes (Signed)
Pt returned from US guided thoracentesis at this time, VSS, bandaid CDI, pt reports mild soreness.

## 2015-11-27 NOTE — Progress Notes (Signed)
Subjective: Pt denies chest pain  Does complain of SOB   Objective: Filed Vitals:   11/26/15 2130 11/27/15 0251 11/27/15 1000 11/27/15 1152  BP: 127/48 109/51  136/83  Pulse: 68 68 85 66  Temp: 97.8 F (36.6 C) 97.9 F (36.6 C)  98 F (36.7 C)  TempSrc: Oral Oral  Oral  Resp: 18 20 22 20   Height:      Weight:  150 lb 11.2 oz (68.357 kg)    SpO2: 99% 97% 91% 90%   Weight change: -3 lb 11.2 oz (-1.678 kg)  Intake/Output Summary (Last 24 hours) at 11/27/15 1313 Last data filed at 11/27/15 1101  Gross per 24 hour  Intake    120 ml  Output    325 ml  Net   -205 ml   I/O  ? Complete  Reported 9.3 L positive   General: Alert, awake,  In NAD   Neck:  JVP is normal Heart: Regular rate and rhythm, without murmurs, rubs, gallops.  Lungs:Diffuse wheezes   Exemities:  Tr edema.      Lab Results: Results for orders placed or performed during the hospital encounter of 11/10/15 (from the past 24 hour(s))  Prepare RBC     Status: None   Collection Time: 11/26/15  2:40 PM  Result Value Ref Range   Order Confirmation ORDER PROCESSED BY BLOOD BANK   Type and screen Terminous     Status: None   Collection Time: 11/26/15  2:40 PM  Result Value Ref Range   ABO/RH(D) A POS    Antibody Screen NEG    Sample Expiration 11/29/2015    Unit Number [id number]    Blood Component Type RBC LR PHER2    Unit division 00    Status of Unit ISSUED,FINAL    Transfusion Status OK TO TRANSFUSE    Crossmatch Result Compatible   ABO/Rh     Status: None   Collection Time: 11/26/15  2:40 PM  Result Value Ref Range   ABO/RH(D) A POS   Glucose, capillary     Status: None   Collection Time: 11/26/15  4:21 PM  Result Value Ref Range   Glucose-Capillary 79 65 - 99 mg/dL  Troponin I (q 6hr x 3)     Status: Abnormal   Collection Time: 11/26/15  6:27 PM  Result Value Ref Range   Troponin I 0.06 (H) <0.031 ng/mL  Glucose, capillary     Status: Abnormal   Collection Time:  11/26/15 10:01 PM  Result Value Ref Range   Glucose-Capillary 107 (H) 65 - 99 mg/dL   Comment 1 Notify RN    Comment 2 Document in Chart   Troponin I (q 6hr x 3)     Status: Abnormal   Collection Time: 11/26/15 11:15 PM  Result Value Ref Range   Troponin I 0.06 (H) <0.031 ng/mL  CBC with Differential/Platelet     Status: Abnormal   Collection Time: 11/27/15  4:30 AM  Result Value Ref Range   WBC 15.3 (H) 4.0 - 10.5 K/uL   RBC 3.10 (L) 4.22 - 5.81 MIL/uL   Hemoglobin 8.3 (L) 13.0 - 17.0 g/dL   HCT 28.2 (L) 39.0 - 52.0 %   MCV 91.0 78.0 - 100.0 fL   MCH 26.8 26.0 - 34.0 pg   MCHC 29.4 (L) 30.0 - 36.0 g/dL   RDW 19.9 (H) 11.5 - 15.5 %   Platelets 114 (L) 150 - 400 K/uL   Neutrophils  Relative % 22 %   Lymphocytes Relative 75 %   Monocytes Relative 2 %   Eosinophils Relative 1 %   Basophils Relative 0 %   Neutro Abs 3.4 1.7 - 7.7 K/uL   Lymphs Abs 11.4 (H) 0.7 - 4.0 K/uL   Monocytes Absolute 0.3 0.1 - 1.0 K/uL   Eosinophils Absolute 0.2 0.0 - 0.7 K/uL   Basophils Absolute 0.0 0.0 - 0.1 K/uL   RBC Morphology POLYCHROMASIA PRESENT    WBC Morphology ABSOLUTE LYMPHOCYTOSIS   Basic metabolic panel     Status: Abnormal   Collection Time: 11/27/15  4:30 AM  Result Value Ref Range   Sodium 145 135 - 145 mmol/L   Potassium 4.1 3.5 - 5.1 mmol/L   Chloride 107 101 - 111 mmol/L   CO2 33 (H) 22 - 32 mmol/L   Glucose, Bld 106 (H) 65 - 99 mg/dL   BUN 30 (H) 6 - 20 mg/dL   Creatinine, Ser 1.48 (H) 0.61 - 1.24 mg/dL   Calcium 8.8 (L) 8.9 - 10.3 mg/dL   GFR calc non Af Amer 40 (L) >60 mL/min   GFR calc Af Amer 47 (L) >60 mL/min   Anion gap 5 5 - 15  Glucose, capillary     Status: Abnormal   Collection Time: 11/27/15  6:43 AM  Result Value Ref Range   Glucose-Capillary 100 (H) 65 - 99 mg/dL   Comment 1 Notify RN    Comment 2 Document in Chart   Glucose, capillary     Status: Abnormal   Collection Time: 11/27/15 12:30 PM  Result Value Ref Range   Glucose-Capillary 123 (H) 65 - 99 mg/dL     Studies/Results: No results found.  Medications: Reviewed    @PROBHOSP @  Pt seen by Olin Pia and Charise Carwin and Lowella Dell  as outpt. Pt is an 82 yo with CAD (s/p CABG ? 1990:  LIMA to LAD; SVG to RCA; Pt has last cath in 98?  Patent LIMA to LAD; SVG to RCA had 80% PDA;90% PLSA), HTN, HL , COPD, chronic diastolic CHF and PPM   Admitted earlier this month for hypoxia, resp failure (3/7)  + for influenza A  Also had GPC bacteremia.  LVEF on echo 30 to 35%  On 3/7 found to be in polymorphic VT    Cardiology consulted  CPR done  Epi  Shock x 2.  Amiodarone given  Peak trop 7.34  In setting of renal insufficiency  Plt 60 K at time  Plan for medical Rx until clinicially improved.  TEE done to r/o vegetations  This was neg.  Pt followed by IM  In interval  1  Acute systolic CHF  On exam today pt with evid of volume increase  Just had thoracentesis  I would increase IV lasix  Strict I/O Would recomm limited echo to redefine LVEF  Confrim that EF still down that that recovered from septic picture  2.  CAD  Peak trop on 3/11 was 7.34   Most aggressive Rx would be L heart cath to redefine anatomy  Last cath as noted above    Could be done next wk  3.  VF  Reported polymorphic VT   Will have current device interrogated (PM)  ON amio  4  Afib  Continue on Xarelto for now.  5  Heme  Plt have iimproved  113  WBC15 with predomin lymphocytes  Hgb 8.3  6  Renal   Cr 1.4 now.  LOS: 16 days   Dorris Carnes 11/27/2015, 1:13 PM

## 2015-11-28 LAB — URINE MICROSCOPIC-ADD ON

## 2015-11-28 LAB — URINALYSIS, ROUTINE W REFLEX MICROSCOPIC
BILIRUBIN URINE: NEGATIVE
GLUCOSE, UA: NEGATIVE mg/dL
Hgb urine dipstick: NEGATIVE
Ketones, ur: NEGATIVE mg/dL
NITRITE: NEGATIVE
PH: 5 (ref 5.0–8.0)
Protein, ur: NEGATIVE mg/dL
SPECIFIC GRAVITY, URINE: 1.02 (ref 1.005–1.030)

## 2015-11-28 LAB — BASIC METABOLIC PANEL
Anion gap: 7 (ref 5–15)
BUN: 27 mg/dL — AB (ref 6–20)
CO2: 34 mmol/L — ABNORMAL HIGH (ref 22–32)
CREATININE: 1.83 mg/dL — AB (ref 0.61–1.24)
Calcium: 8.8 mg/dL — ABNORMAL LOW (ref 8.9–10.3)
Chloride: 104 mmol/L (ref 101–111)
GFR calc Af Amer: 36 mL/min — ABNORMAL LOW (ref >60)
GFR, EST NON AFRICAN AMERICAN: 31 mL/min — AB (ref >60)
GLUCOSE: 107 mg/dL — AB (ref 65–99)
POTASSIUM: 4 mmol/L (ref 3.5–5.1)
SODIUM: 145 mmol/L (ref 135–145)

## 2015-11-28 LAB — GLUCOSE, CAPILLARY
GLUCOSE-CAPILLARY: 95 mg/dL (ref 65–99)
Glucose-Capillary: 149 mg/dL — ABNORMAL HIGH (ref 65–99)
Glucose-Capillary: 106 mg/dL — ABNORMAL HIGH (ref 65–99)

## 2015-11-28 LAB — CBC
HCT: 29.6 % — ABNORMAL LOW (ref 39.0–52.0)
Hemoglobin: 9.1 g/dL — ABNORMAL LOW (ref 13.0–17.0)
MCH: 28 pg (ref 26.0–34.0)
MCHC: 30.7 g/dL (ref 30.0–36.0)
MCV: 91.1 fL (ref 78.0–100.0)
PLATELETS: 116 10*3/uL — AB (ref 150–400)
RBC: 3.25 MIL/uL — AB (ref 4.22–5.81)
RDW: 19.4 % — ABNORMAL HIGH (ref 11.5–15.5)
WBC: 19.1 10*3/uL — ABNORMAL HIGH (ref 4.0–10.5)

## 2015-11-28 MED ORDER — FUROSEMIDE 10 MG/ML IJ SOLN
80.0000 mg | Freq: Every day | INTRAMUSCULAR | Status: DC
  Administered 2015-11-29 – 2015-11-30 (×2): 80 mg via INTRAVENOUS
  Filled 2015-11-28: qty 8

## 2015-11-28 NOTE — Progress Notes (Signed)
Subjective:  Patient seen in room with daughter.  He complains of chest wall soreness and tenderness probably where he had his CPR.  Also has mild shortness of breath.  Previous issues with sepsis and V. fib arrest were noted.  Objective:  Vital Signs in the last 24 hours: BP 111/48 mmHg  Pulse 59  Temp(Src) 98.2 F (36.8 C) (Oral)  Resp 18  Ht 5\' 11"  (1.803 m)  Wt 64.411 kg (142 lb)  BMI 19.81 kg/m2  SpO2 94%  Physical Exam: Older man lying in bed currently in no acute distress Lungs:  Diffuse crackles  Cardiac:  Regular rhythm, normal S1 and S2, no S3 Abdomen:  Soft, nontender, no masses Extremities:  No edema present  Intake/Output from previous day: 03/24 0701 - 03/25 0700 In: 460 [P.O.:440; I.V.:20] Out: 1150 [Urine:1150] Weight Filed Weights   11/26/15 0424 11/27/15 0251 11/28/15 0543  Weight: 68.765 kg (151 lb 9.6 oz) 68.357 kg (150 lb 11.2 oz) 64.411 kg (142 lb)    Lab Results: Basic Metabolic Panel:  Recent Labs  11/27/15 0430 11/28/15 0425  NA 145 145  K 4.1 4.0  CL 107 104  CO2 33* 34*  GLUCOSE 106* 107*  BUN 30* 27*  CREATININE 1.48* 1.83*    CBC:  Recent Labs  11/27/15 0430 11/28/15 0425  WBC 15.3* 19.1*  NEUTROABS 3.4  --   HGB 8.3* 9.1*  HCT 28.2* 29.6*  MCV 91.0 91.1  PLT 114* 116*    BNP    Component Value Date/Time   BNP 406.2* 11/16/2015 0353   BNP 5171* 03/13/2014 1109   Telemetry: Paced AV rhythm  Assessment/Plan:  1.  Acute on chronic systolic heart failure unsure whether this is acute due to sepsis but has not recovered or more chronic or could be due to chronic RV pacemaking. 2.  Recent pneumonia and sepsis 3.  History of V. fib arrest 4.  History of atrial fibrillation 5.  Long-term use of Xarelto 6.  Permanent pacemaker implantation  Recommendations:  He is feeling better but is not edematous.  Had thoracentesis yesterday.  The most important thing to do will be over the weekend to find what theng-term goals  of therapy and level of aggressiveness would be.  He currently is not a great candidate for cardiac revascularization.   He is currently listed as DO NOT RESUSCITATE.  Continue Lasix at current dose.  LV function was normal in 2010.        W. Doristine Church  MD [facility]  11/28/2015, 1:09 PM

## 2015-11-28 NOTE — Progress Notes (Signed)
[facility] PROGRESS NOTE  [name] [name] QQV:[id number] DOB: Dec 12, 1925 DOA: 11/10/2015 PCP: Odette Fraction, MD  HPI/Brief narrative Please see admission H&P from 11/10/2015. Briefly the patient is an 82 year old male with a history of COPD, CAD status post CABG, A. fib, dementia who was initially admitted with acute proxy make rest 3 failure secondary to influenza a. During the course, the patient was noted to have worsening respiratory distress. He later had nonsustained V. tach and later found to be unresponsive and pulseless. Patient was intubated and transferred to ICU on 3/10. Patient since been successfully extubated on 11/18/2015 and care transferred to the hospitalist service  Assessment/Plan: 1. Acute hypoxemic respiratory failure/vfib arrest on 3/10/CHF/pleural effusion 1. Successfully extubated on 3/15. Stable at present 2. Patient on scheduled IV lasix given CXR findings worrisome for pulm edema that appears improved on follow up CXR 3. Due to persistent oxygen requirment, repeat cxr on 3/23 showed worsening of pleural effusion, will get ct chest. Increase lasix, patient c/o left sided chest pain and sob on 3/23 am, cycle troponin unremarkable, bp stable, but chest ct did review bilateral pleural effusion, right >left, IR consulted for US guided thoracentesis, per phone conversation with Dr Anselm Pancoast, no need to be npo, no need to hold xarelto. I have also called patient's sister and updated her over the phone at [phone] 4. 3/24 1.6L pleural effusion removed, transudative, gram stain no organism. Diuretics per cardiology 2. NSVT 1. Pt continued on amiodarone, transition to PO 2. Remains stable 3. Hx afib and heart block s/p pacemaker 1. Rate controlled, paced rhythm 2. CHADS-VASc 2, previously on xarelto. Had remained on heparin gtt. 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 Baseline Cr around 1.2 2. Cr  had remained in the 1.5-1.7 range, suspect secondary to end-organ damage from shock vs sepsis 3. Cr had improved with gentle IVF, now stopped since goal is continued diuresis 5. Thrombocytopenia 1. Possibly related to heparin vs end-organ damage as plts noted to be 51k on day of admit 2. improving 6. Flu A with sepsis present on admission, treated 7. Viridians strep bacteremia 1. Had been on empiric vanc and rocephin. Now on rocephin alone based on sensitivities, TEE on 3/15 no vegetation  2. Discussed with ID. Recs for 10 days of tx. Completed course of abx on 3/20 (14 days total) 3. Remains afebrile, repeat blood culture no growth 8. Anemia; normocytic, close to baseline, likely anemia of chronic disease, check FOBT. Will transfuse 1 prbc on 3/23 due to hgb trending down 9. Leukocytosis: better 10. Acute on chronic systolic CHF , ef 33-29%, increase lasix, not on betablocker due to h/o mobitz second-degree heart block, but no s/p pacemaker, not on acei due to elevated  Cr, cardiology consulted. 11. Dementia 1. stable 12. DVT prophylaxis 1. Transition to xarelto  Code Status: DNR, code status was discussed with family by icu attending Family Communication: Pt in room, sister over the phone on 3/23, daughter at bedside on 3/24 Disposition Plan: cath on Monday per cardiology  Consultants:  Critical Care  Cardiology  IR  Procedures:  Intubation 3/10 and extubation 3/15  Code blue, CPR, defibrillation on 3/10  picc line placement  TEE 3/15  Thoracentesis for pleural effusion on 3/24  Antibiotics: Anti-infectives    Start     Dose/Rate Route Frequency Ordered Stop   11/16/15 1545  vancomycin (VANCOCIN) IVPB 750 mg/150 ml premix  Status:  Discontinued  750 mg 150 mL/hr over 60 Minutes Intravenous Every 24 hours 11/16/15 1543 11/18/15 1503   11/15/15 0000  vancomycin (VANCOCIN) 1,250 mg in sodium chloride 0.9 % 250 mL IVPB  Status:  Discontinued     1,250 mg 166.7 mL/hr  over 90 Minutes Intravenous Every 24 hours 11/14/15 0653 11/15/15 1527   11/13/15 2345  cefTRIAXone (ROCEPHIN) 2 g in dextrose 5 % 50 mL IVPB  Status:  Discontinued     2 g 100 mL/hr over 30 Minutes Intravenous Daily at bedtime 11/13/15 2327 11/23/15 1450   11/12/15 0900  cefTRIAXone (ROCEPHIN) 2 g in dextrose 5 % 50 mL IVPB  Status:  Discontinued     2 g 100 mL/hr over 30 Minutes Intravenous Every 24 hours 11/11/15 1323 11/11/15 1433   11/11/15 2200  oseltamivir (TAMIFLU) 6 MG/ML suspension 30 mg     30 mg Oral 2 times daily 11/11/15 1340 11/14/15 2152   11/11/15 1400  cefTRIAXone (ROCEPHIN) 1 g in dextrose 5 % 50 mL IVPB  Status:  Discontinued     1 g 100 mL/hr over 30 Minutes Intravenous  Once 11/11/15 1324 11/11/15 1433   11/11/15 1200  azithromycin (ZITHROMAX) 500 mg in dextrose 5 % 250 mL IVPB  Status:  Discontinued     500 mg 250 mL/hr over 60 Minutes Intravenous Every 24 hours 11/10/15 1318 11/11/15 1433   11/11/15 0900  cefTRIAXone (ROCEPHIN) 1 g in dextrose 5 % 50 mL IVPB  Status:  Discontinued     1 g 100 mL/hr over 30 Minutes Intravenous Every 24 hours 11/10/15 1318 11/11/15 1323   11/11/15 0600  vancomycin (VANCOCIN) IVPB 1000 mg/200 mL premix  Status:  Discontinued     1,000 mg 200 mL/hr over 60 Minutes Intravenous Every 24 hours 11/11/15 0528 11/14/15 0653   11/10/15 1500  oseltamivir (TAMIFLU) 6 MG/ML suspension 75 mg  Status:  Discontinued     75 mg Oral 2 times daily 11/10/15 1448 11/11/15 1340   11/10/15 1130  azithromycin (ZITHROMAX) 500 mg in dextrose 5 % 250 mL IVPB     500 mg 250 mL/hr over 60 Minutes Intravenous  Once 11/10/15 1127 11/10/15 1319   11/10/15 0830  cefTRIAXone (ROCEPHIN) 1 g in dextrose 5 % 50 mL IVPB     1 g 100 mL/hr over 30 Minutes Intravenous  Once 11/10/15 0828 11/10/15 1222      HPI/Subjective: Still on 3liter oxygen,  feeling better, no cough, chest wall tenderness, baseline dementia, not oriented to time, family in  room  Objective: Filed Vitals:   11/27/15 1927 11/27/15 2115 11/28/15 0543 11/28/15 1219  BP:  112/47 112/51 111/48  Pulse:  70 60 59  Temp:  98.4 F (36.9 C) 98.4 F (36.9 C) 98.2 F (36.8 C)  TempSrc:  Oral Oral Oral  Resp:  17 16 18   Height:      Weight:   64.411 kg (142 lb)   SpO2: 100% 96% 96% 94%    Intake/Output Summary (Last 24 hours) at 11/28/15 1432 Last data filed at 11/28/15 1300  Gross per 24 hour  Intake    580 ml  Output   1150 ml  Net   -570 ml   Filed Weights   11/26/15 0424 11/27/15 0251 11/28/15 0543  Weight: 68.765 kg (151 lb 9.6 oz) 68.357 kg (150 lb 11.2 oz) 64.411 kg (142 lb)    Exam:   General:  Awake, frail, in nad, picc line on right arm  Cardiovascular: paced rhythm  Respiratory: improved areation, no wheezing  Abdomen: soft, nondistended, pos BS  Musculoskeletal: perfused, no clubbing, no cyanosis, no edema  Neuro; baseline dementia  Data Reviewed: Basic Metabolic Panel:  Recent Labs Lab 11/24/15 0422 11/25/15 0429 11/26/15 0520 11/27/15 0430 11/28/15 0425  NA 144 145 145 145 145  K 3.5 4.0 4.0 4.1 4.0  CL 105 104 105 107 104  CO2 32 32 33* 33* 34*  GLUCOSE 129* 111* 99 106* 107*  BUN 29* 34* 31* 30* 27*  CREATININE 1.75* 1.84* 1.64* 1.48* 1.83*  CALCIUM 8.6* 8.9 9.0 8.8* 8.8*   Liver Function Tests: No results for input(s): AST, ALT, ALKPHOS, BILITOT, PROT, ALBUMIN in the last 168 hours. No results for input(s): LIPASE, AMYLASE in the last 168 hours. No results for input(s): AMMONIA in the last 168 hours. CBC:  Recent Labs Lab 11/24/15 0422 11/25/15 0429 11/26/15 0520 11/27/15 0430 11/28/15 0425  WBC 19.3* 16.3* 17.6* 15.3* 19.1*  NEUTROABS  --   --   --  3.4  --   HGB 8.0* 7.8* 7.5* 8.3* 9.1*  HCT 27.5* 26.6* 26.0*  26.5* 28.2* 29.6*  MCV 90.8 90.8 91.9 91.0 91.1  PLT 108* 106* 117* 114* 116*   Cardiac Enzymes:  Recent Labs Lab 11/26/15 1250 11/26/15 1827 11/26/15 2315  TROPONINI 0.05* 0.06*  0.06*   BNP (last 3 results)  Recent Labs  11/10/15 1456 11/16/15 0353  BNP 1792.8* 406.2*    ProBNP (last 3 results) No results for input(s): PROBNP in the last 8760 hours.  CBG:  Recent Labs Lab 11/27/15 1230 11/27/15 1717 11/27/15 2131 11/28/15 0616 11/28/15 1116  GLUCAP 123* 184* 95 95 149*    Recent Results (from the past 240 hour(s))  Culture, body fluid-bottle     Status: None (Preliminary result)   Collection Time: 11/27/15  3:40 PM  Result Value Ref Range Status   Specimen Description FLUID RIGHT PLEURAL  Final   Special Requests BOTTLES DRAWN AEROBIC AND ANAEROBIC 10CC  Final   Culture NO GROWTH < 24 HOURS  Final   Report Status PENDING  Incomplete  Gram stain     Status: None   Collection Time: 11/27/15  3:40 PM  Result Value Ref Range Status   Specimen Description FLUID RIGHT PLEURAL  Final   Special Requests NONE  Final   Gram Stain   Final    MODERATE WBC PRESENT,BOTH PMN AND MONONUCLEAR NO ORGANISMS SEEN    Report Status 11/27/2015 FINAL  Final     Studies: Dg Chest 1 View  11/27/2015  CLINICAL DATA:  Right-sided pleural effusion status post thoracentesis EXAM: CHEST 1 VIEW COMPARISON:  11/26/2015 FINDINGS: No pneumothorax. Significant decrease in the size right pleural effusion. Trace bilateral pleural effusions remain. PICC line and cardiac pacer unchanged. Moderate cardiac enlargement stable. Stable aortic calcification. IMPRESSION: No pneumothorax status post thoracentesis. Electronically Signed   By: Skipper Cliche M.D.   On: 11/27/2015 15:06   US Thoracentesis Asp Pleural Space W/img Guide  11/27/2015  INDICATION: Shortness of breath. Pleural effusion noted on recent CT scan. Request diagnostic and therapeutic thoracentesis. EXAM: ULTRASOUND GUIDED RIGHT THORACENTESIS MEDICATIONS: None. COMPLICATIONS: None immediate. PROCEDURE: An ultrasound guided thoracentesis was thoroughly discussed with the patient and questions answered. The benefits,  risks, alternatives and complications were also discussed. The patient understands and wishes to proceed with the procedure. Written consent was obtained. Ultrasound was performed to localize and mark an adequate pocket of fluid in the right chest.  The area was then prepped and draped in the normal sterile fashion. 1% Lidocaine was used for local anesthesia. Under ultrasound guidance a Safe-T-Centesis catheter was introduced. Thoracentesis was performed. The catheter was removed and a dressing applied. FINDINGS: A total of approximately 1.6 L of hazy, blood-tinged fluid was removed. Samples were sent to the laboratory as requested by the clinical team. IMPRESSION: Successful ultrasound guided right thoracentesis yielding 1.6 L of pleural fluid. Read by: Ascencion Dike PA-C Electronically Signed   By: Sandi Mariscal M.D.   On: 11/27/2015 15:03    Scheduled Meds: . amiodarone  200 mg Oral Daily  . antiseptic oral rinse  7 mL Mouth Rinse QID  . arformoterol  15 mcg Nebulization BID  . budesonide (PULMICORT) nebulizer solution  0.25 mg Nebulization BID  . chlorhexidine gluconate (SAGE KIT)  15 mL Mouth Rinse BID  . donepezil  5 mg Oral QHS  . feeding supplement (PRO-STAT SUGAR FREE 64)  30 mL Oral BID  . [START ON 11/29/2015] furosemide  80 mg Intravenous Daily  . insulin aspart  0-15 Units Subcutaneous TID WC  . multivitamin with minerals  1 tablet Oral Daily  . omeprazole  40 mg Oral Daily  . polyethylene glycol  17 g Oral Daily  . rivaroxaban  15 mg Oral Q supper  . senna-docusate  2 tablet Oral BID  . simvastatin  10 mg Oral q1800  . tamsulosin  0.4 mg Oral Daily   Continuous Infusions:    Active Problems:   Acute systolic heart failure (HCC)   Sepsis due to pneumonia ([place])   Acute encephalopathy   Influenza with pneumonia   Hypotension   Protein-calorie malnutrition, severe ([place])   Cardiac arrest ([place])   Polymorphic ventricular tachycardia (HCC)   Pressure ulcer   Encounter for imaging  study to confirm orogastric (OG) tube placement   Endotracheally intubated   Bacteremia due to Streptococcus   Pleural effusion on right   Acute on chronic systolic congestive heart failure (HCC)   Time 68mns  Marigrace Mccole MD PhD  [facility] Pager [phone] If 7PM-7AM, please contact night-coverage at [email or web address], password [email or web address]3/25/2017, 2:32 PM  LOS: 17 days

## 2015-11-29 LAB — CBC WITH DIFFERENTIAL/PLATELET
BASOS ABS: 0 10*3/uL (ref 0.0–0.1)
Basophils Relative: 0 %
Eosinophils Absolute: 0 10*3/uL (ref 0.0–0.7)
Eosinophils Relative: 0 %
HEMATOCRIT: 27.3 % — AB (ref 39.0–52.0)
HEMOGLOBIN: 8.4 g/dL — AB (ref 13.0–17.0)
LYMPHS PCT: 76 %
Lymphs Abs: 11.9 10*3/uL — ABNORMAL HIGH (ref 0.7–4.0)
MCH: 28.1 pg (ref 26.0–34.0)
MCHC: 30.8 g/dL (ref 30.0–36.0)
MCV: 91.3 fL (ref 78.0–100.0)
MONOS PCT: 4 %
Monocytes Absolute: 0.6 10*3/uL (ref 0.1–1.0)
NEUTROS ABS: 3.1 10*3/uL (ref 1.7–7.7)
Neutrophils Relative %: 20 %
Platelets: 113 10*3/uL — ABNORMAL LOW (ref 150–400)
RBC: 2.99 MIL/uL — AB (ref 4.22–5.81)
RDW: 19.1 % — ABNORMAL HIGH (ref 11.5–15.5)
WBC: 15.6 10*3/uL — ABNORMAL HIGH (ref 4.0–10.5)

## 2015-11-29 LAB — BASIC METABOLIC PANEL
ANION GAP: 5 (ref 5–15)
BUN: 23 mg/dL — ABNORMAL HIGH (ref 6–20)
CHLORIDE: 102 mmol/L (ref 101–111)
CO2: 36 mmol/L — ABNORMAL HIGH (ref 22–32)
Calcium: 8.9 mg/dL (ref 8.9–10.3)
Creatinine, Ser: 1.67 mg/dL — ABNORMAL HIGH (ref 0.61–1.24)
GFR calc Af Amer: 41 mL/min — ABNORMAL LOW (ref >60)
GFR, EST NON AFRICAN AMERICAN: 35 mL/min — AB (ref >60)
GLUCOSE: 115 mg/dL — AB (ref 65–99)
POTASSIUM: 4.1 mmol/L (ref 3.5–5.1)
Sodium: 143 mmol/L (ref 135–145)

## 2015-11-29 LAB — GLUCOSE, CAPILLARY
GLUCOSE-CAPILLARY: 142 mg/dL — AB (ref 65–99)
GLUCOSE-CAPILLARY: 94 mg/dL (ref 65–99)
GLUCOSE-CAPILLARY: 94 mg/dL (ref 65–99)
Glucose-Capillary: 102 mg/dL — ABNORMAL HIGH (ref 65–99)
Glucose-Capillary: 139 mg/dL — ABNORMAL HIGH (ref 65–99)

## 2015-11-29 LAB — SAVE SMEAR

## 2015-11-29 LAB — LACTATE DEHYDROGENASE: LDH: 180 U/L (ref 98–192)

## 2015-11-29 MED ORDER — AMIODARONE HCL 200 MG PO TABS
200.0000 mg | ORAL_TABLET | Freq: Every day | ORAL | Status: DC
Start: 2015-11-29 — End: 2015-12-11

## 2015-11-29 MED ORDER — GUAIFENESIN ER 600 MG PO TB12: 600.0000 mg | ORAL_TABLET | Freq: Two times a day (BID) | ORAL | Status: DC

## 2015-11-29 MED ORDER — BUDESONIDE 0.25 MG/2ML IN SUSP: 0.2500 mg | Freq: Two times a day (BID) | RESPIRATORY_TRACT | Status: DC

## 2015-11-29 MED ORDER — ARFORMOTEROL TARTRATE 15 MCG/2ML IN NEBU: 15.0000 ug | INHALATION_SOLUTION | Freq: Two times a day (BID) | RESPIRATORY_TRACT | Status: DC

## 2015-11-29 MED ORDER — GUAIFENESIN ER 600 MG PO TB12
600.0000 mg | ORAL_TABLET | Freq: Two times a day (BID) | ORAL | Status: DC
  Administered 2015-11-29 – 2015-12-01 (×5): 600 mg via ORAL
  Filled 2015-11-29 (×5): qty 1

## 2015-11-29 MED ORDER — IPRATROPIUM-ALBUTEROL 0.5-2.5 (3) MG/3ML IN SOLN
3.0000 mL | Freq: Three times a day (TID) | RESPIRATORY_TRACT | Status: DC
  Administered 2015-11-29 – 2015-12-01 (×8): 3 mL via RESPIRATORY_TRACT
  Filled 2015-11-29 (×8): qty 3

## 2015-11-29 MED ORDER — PRO-STAT SUGAR FREE PO LIQD: 30.0000 mL | Freq: Two times a day (BID) | ORAL | Status: DC

## 2015-11-29 MED ORDER — MINERAL OIL RE ENEM
1.0000 | ENEMA | Freq: Once | RECTAL | Status: AC
  Administered 2015-11-29: 1 via RECTAL
  Filled 2015-11-29: qty 1

## 2015-11-29 MED ORDER — ALTEPLASE 2 MG IJ SOLR
2.0000 mg | Freq: Once | INTRAMUSCULAR | Status: AC
  Administered 2015-11-29: 2 mg

## 2015-11-29 MED ORDER — POLYETHYLENE GLYCOL 3350 17 G PO PACK: 17.0000 g | PACK | Freq: Every day | ORAL | Status: DC

## 2015-11-29 MED ORDER — SENNOSIDES-DOCUSATE SODIUM 8.6-50 MG PO TABS: 1.0000 | ORAL_TABLET | Freq: Every day | ORAL | Status: DC

## 2015-11-29 MED ORDER — ATORVASTATIN CALCIUM 10 MG PO TABS: 10.0000 mg | ORAL_TABLET | Freq: Every day | ORAL | Status: DC

## 2015-11-29 NOTE — Progress Notes (Signed)
Mineral oil enema given- no results as of yet

## 2015-11-29 NOTE — Progress Notes (Signed)
[facility] PROGRESS NOTE  [name] [name] JOI:[id number] DOB: 07/12/1926 DOA: 11/10/2015 PCP: Odette Fraction, MD  HPI/Brief narrative Please see admission H&P from 11/10/2015. Briefly the patient is an 82 year old male with a history of COPD, CAD status post CABG, A. fib, dementia who was initially admitted with acute proxy make rest 3 failure secondary to influenza a. During the course, the patient was noted to have worsening respiratory distress. He later had nonsustained V. tach and later found to be unresponsive and pulseless. Patient was intubated and transferred to ICU on 3/10. Patient since been successfully extubated on 11/18/2015 and care transferred to the hospitalist service  Assessment/Plan: 1. Acute hypoxemic respiratory failure/vfib arrest on 3/10/systolic CHF/pleural effusion 1. Successfully extubated on 3/15. Stable at present 2. Patient on scheduled IV lasix given CXR findings worrisome for pulm edema that appears improved on follow up CXR 3. Due to persistent oxygen requirment, repeat cxr on 3/23 showed worsening of pleural effusion, chest ct did review bilateral pleural effusion, right >left, IR consulted for US guided thoracentesis, 3/24 1.6L pleural effusion removed, transudative, gram stain no organism. Diuretics per cardiology 4. Repeat echo ordered by cardiology on 3/24 with reduced lvef 25-30%, cardiology to decide whether to proceed with cath or not, if not cath, then discharge to snf. 2. NSVT 1. Pt continued on amiodarone, transition to PO 2. Remains stable 3. Hx afib and heart block s/p pacemaker 1. Rate controlled, paced rhythm 2. CHADS-VASc 2, previously on xarelto. Had remained on heparin gtt. Transition back to xaralto on 3/21, monitor cr, cardiology consulted for medication optimiztion 4.   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 Baseline Cr around 1.2 2. suspect secondary to end-organ damage from shock vs sepsis 3. Cr improving 6. Thrombocytopenia 1. Possibly related to heparin vs end-organ damage as plts noted to be 51k on day of admit 2. improving 7. Flu A with sepsis present on admission, treated 8. Viridians strep bacteremia 1. Had been on empiric vanc and rocephin. Now on rocephin alone based on sensitivities, TEE on 3/15 no vegetation  2. Discussed with ID. Recs for 10 days of tx. Completed course of abx on 3/20 (14 days total) 3. Remains afebrile, repeat blood culture no growth 9. Anemia; normocytic, close to baseline, likely anemia of chronic disease, check FOBT. transfuse 1 prbc on 3/23 due to hgb trending down 10. Leukocytosis: better 11. Dementia 1. Stable, on aricept, monitor bradycardia 12. DVT prophylaxis 1. On xarelto  Code Status: DNR, code status was discussed with family by icu attending Family Communication: Pt in room, sister over the phone on 3/23, daughter at bedside on 3/24, 3/25 and 3/26 Disposition Plan: will keep patient npo aftermidnight, in no plan for cath on Monday per cardiology, likely d/c to snf soon,  Cardiology to advise on lasix dose  Consultants:  Critical Care  Cardiology  IR  Procedures:  Intubation 3/10 and extubation 3/15  Code blue, CPR, defibrillation on 3/10  picc line placement  TEE 3/15  Thoracentesis for pleural effusion on 3/24  Antibiotics: Anti-infectives    Start     Dose/Rate Route Frequency Ordered Stop   11/16/15 1545  vancomycin (VANCOCIN) IVPB 750 mg/150 ml premix  Status:  Discontinued     750 mg 150 mL/hr over 60 Minutes Intravenous Every 24 hours 11/16/15 1543  11/18/15 1503   11/15/15 0000  vancomycin (VANCOCIN) 1,250 mg in sodium chloride 0.9 % 250 mL IVPB  Status:  Discontinued     1,250 mg 166.7 mL/hr over 90 Minutes Intravenous Every 24 hours 11/14/15 0653 11/15/15 1527   11/13/15 2345   cefTRIAXone (ROCEPHIN) 2 g in dextrose 5 % 50 mL IVPB  Status:  Discontinued     2 g 100 mL/hr over 30 Minutes Intravenous Daily at bedtime 11/13/15 2327 11/23/15 1450   11/12/15 0900  cefTRIAXone (ROCEPHIN) 2 g in dextrose 5 % 50 mL IVPB  Status:  Discontinued     2 g 100 mL/hr over 30 Minutes Intravenous Every 24 hours 11/11/15 1323 11/11/15 1433   11/11/15 2200  oseltamivir (TAMIFLU) 6 MG/ML suspension 30 mg     30 mg Oral 2 times daily 11/11/15 1340 11/14/15 2152   11/11/15 1400  cefTRIAXone (ROCEPHIN) 1 g in dextrose 5 % 50 mL IVPB  Status:  Discontinued     1 g 100 mL/hr over 30 Minutes Intravenous  Once 11/11/15 1324 11/11/15 1433   11/11/15 1200  azithromycin (ZITHROMAX) 500 mg in dextrose 5 % 250 mL IVPB  Status:  Discontinued     500 mg 250 mL/hr over 60 Minutes Intravenous Every 24 hours 11/10/15 1318 11/11/15 1433   11/11/15 0900  cefTRIAXone (ROCEPHIN) 1 g in dextrose 5 % 50 mL IVPB  Status:  Discontinued     1 g 100 mL/hr over 30 Minutes Intravenous Every 24 hours 11/10/15 1318 11/11/15 1323   11/11/15 0600  vancomycin (VANCOCIN) IVPB 1000 mg/200 mL premix  Status:  Discontinued     1,000 mg 200 mL/hr over 60 Minutes Intravenous Every 24 hours 11/11/15 0528 11/14/15 0653   11/10/15 1500  oseltamivir (TAMIFLU) 6 MG/ML suspension 75 mg  Status:  Discontinued     75 mg Oral 2 times daily 11/10/15 1448 11/11/15 1340   11/10/15 1130  azithromycin (ZITHROMAX) 500 mg in dextrose 5 % 250 mL IVPB     500 mg 250 mL/hr over 60 Minutes Intravenous  Once 11/10/15 1127 11/10/15 1319   11/10/15 0830  cefTRIAXone (ROCEPHIN) 1 g in dextrose 5 % 50 mL IVPB     1 g 100 mL/hr over 30 Minutes Intravenous  Once 11/10/15 0828 11/10/15 1222      HPI/Subjective: on 2liter oxygen,  feeling better, still congested cough, chest wall tenderness, baseline dementia, not oriented to time, family in room  Objective: Filed Vitals:   11/28/15 1219 11/28/15 2350 11/29/15 0523 11/29/15 0742  BP: 111/48  112/46 128/48   Pulse: 59 62 59   Temp: 98.2 F (36.8 C) 97.8 F (36.6 C) 97.7 F (36.5 C)   TempSrc: Oral Oral Oral   Resp: 18 20 19    Height:      Weight:   65.363 kg (144 lb 1.6 oz)   SpO2: 94% 99% 97% 91%    Intake/Output Summary (Last 24 hours) at 11/29/15 1157 Last data filed at 11/29/15 0900  Gross per 24 hour  Intake    220 ml  Output    925 ml  Net   -705 ml   Filed Weights   11/27/15 0251 11/28/15 0543 11/29/15 0523  Weight: 68.357 kg (150 lb 11.2 oz) 64.411 kg (142 lb) 65.363 kg (144 lb 1.6 oz)    Exam:   General:  Awake, frail, in nad, picc line on right arm  Cardiovascular: paced rhythm  Respiratory: improved areation, no wheezing  Abdomen: soft, nondistended, pos BS  Musculoskeletal: perfused, no clubbing, no cyanosis, no edema  Neuro; baseline dementia  Data Reviewed: Basic Metabolic Panel:  Recent Labs Lab 11/25/15 0429 11/26/15 0520 11/27/15 0430 11/28/15 0425 11/29/15 0439  NA 145 145 145 145 143  K 4.0 4.0 4.1 4.0 4.1  CL 104 105 107 104 102  CO2 32 33* 33* 34* 36*  GLUCOSE 111* 99 106* 107* 115*  BUN 34* 31* 30* 27* 23*  CREATININE 1.84* 1.64* 1.48* 1.83* 1.67*  CALCIUM 8.9 9.0 8.8* 8.8* 8.9   Liver Function Tests: No results for input(s): AST, ALT, ALKPHOS, BILITOT, PROT, ALBUMIN in the last 168 hours. No results for input(s): LIPASE, AMYLASE in the last 168 hours. No results for input(s): AMMONIA in the last 168 hours. CBC:  Recent Labs Lab 11/25/15 0429 11/26/15 0520 11/27/15 0430 11/28/15 0425 11/29/15 0439  WBC 16.3* 17.6* 15.3* 19.1* 15.6*  NEUTROABS  --   --  3.4  --  3.1  HGB 7.8* 7.5* 8.3* 9.1* 8.4*  HCT 26.6* 26.0*  26.5* 28.2* 29.6* 27.3*  MCV 90.8 91.9 91.0 91.1 91.3  PLT 106* 117* 114* 116* 113*   Cardiac Enzymes:  Recent Labs Lab 11/26/15 1250 11/26/15 1827 11/26/15 2315  TROPONINI 0.05* 0.06* 0.06*   BNP (last 3 results)  Recent Labs  11/10/15 1456 11/16/15 0353  BNP 1792.8* 406.2*     ProBNP (last 3 results) No results for input(s): PROBNP in the last 8760 hours.  CBG:  Recent Labs Lab 11/28/15 1116 11/28/15 1556 11/29/15 0019 11/29/15 0625 11/29/15 1102  GLUCAP 149* 106* 94 102* 139*    Recent Results (from the past 240 hour(s))  Culture, body fluid-bottle     Status: None (Preliminary result)   Collection Time: 11/27/15  3:40 PM  Result Value Ref Range Status   Specimen Description FLUID RIGHT PLEURAL  Final   Special Requests BOTTLES DRAWN AEROBIC AND ANAEROBIC 10CC  Final   Culture NO GROWTH < 24 HOURS  Final   Report Status PENDING  Incomplete  Gram stain     Status: None   Collection Time: 11/27/15  3:40 PM  Result Value Ref Range Status   Specimen Description FLUID RIGHT PLEURAL  Final   Special Requests NONE  Final   Gram Stain   Final    MODERATE WBC PRESENT,BOTH PMN AND MONONUCLEAR NO ORGANISMS SEEN    Report Status 11/27/2015 FINAL  Final     Studies: Dg Chest 1 View  11/27/2015  CLINICAL DATA:  Right-sided pleural effusion status post thoracentesis EXAM: CHEST 1 VIEW COMPARISON:  11/26/2015 FINDINGS: No pneumothorax. Significant decrease in the size right pleural effusion. Trace bilateral pleural effusions remain. PICC line and cardiac pacer unchanged. Moderate cardiac enlargement stable. Stable aortic calcification. IMPRESSION: No pneumothorax status post thoracentesis. Electronically Signed   By: Skipper Cliche M.D.   On: 11/27/2015 15:06   US Thoracentesis Asp Pleural Space W/img Guide  11/27/2015  INDICATION: Shortness of breath. Pleural effusion noted on recent CT scan. Request diagnostic and therapeutic thoracentesis. EXAM: ULTRASOUND GUIDED RIGHT THORACENTESIS MEDICATIONS: None. COMPLICATIONS: None immediate. PROCEDURE: An ultrasound guided thoracentesis was thoroughly discussed with the patient and questions answered. The benefits, risks, alternatives and complications were also discussed. The patient understands and wishes to  proceed with the procedure. Written consent was obtained. Ultrasound was performed to localize and mark an adequate pocket of fluid in the right chest. The area was then prepped and draped in the normal sterile fashion.  1% Lidocaine was used for local anesthesia. Under ultrasound guidance a Safe-T-Centesis catheter was introduced. Thoracentesis was performed. The catheter was removed and a dressing applied. FINDINGS: A total of approximately 1.6 L of hazy, blood-tinged fluid was removed. Samples were sent to the laboratory as requested by the clinical team. IMPRESSION: Successful ultrasound guided right thoracentesis yielding 1.6 L of pleural fluid. Read by: Ascencion Dike PA-C Electronically Signed   By: Sandi Mariscal M.D.   On: 11/27/2015 15:03    Scheduled Meds: . amiodarone  200 mg Oral Daily  . antiseptic oral rinse  7 mL Mouth Rinse QID  . arformoterol  15 mcg Nebulization BID  . budesonide (PULMICORT) nebulizer solution  0.25 mg Nebulization BID  . chlorhexidine gluconate (SAGE KIT)  15 mL Mouth Rinse BID  . donepezil  5 mg Oral QHS  . feeding supplement (PRO-STAT SUGAR FREE 64)  30 mL Oral BID  . furosemide  80 mg Intravenous Daily  . insulin aspart  0-15 Units Subcutaneous TID WC  . ipratropium-albuterol  3 mL Nebulization TID  . multivitamin with minerals  1 tablet Oral Daily  . omeprazole  40 mg Oral Daily  . polyethylene glycol  17 g Oral Daily  . rivaroxaban  15 mg Oral Q supper  . senna-docusate  2 tablet Oral BID  . simvastatin  10 mg Oral q1800  . tamsulosin  0.4 mg Oral Daily   Continuous Infusions:    Active Problems:   Acute systolic heart failure (HCC)   Sepsis due to pneumonia ([place])   Acute encephalopathy   Influenza with pneumonia   Hypotension   Protein-calorie malnutrition, severe ([place])   Cardiac arrest ([place])   Polymorphic ventricular tachycardia (HCC)   Pressure ulcer   Encounter for imaging study to confirm orogastric (OG) tube placement   Endotracheally  intubated   Bacteremia due to Streptococcus   Pleural effusion on right   Acute on chronic systolic congestive heart failure (HCC)   Time 45mns  Colter Magowan MD PhD  [facility] Pager [phone] If 7PM-7AM, please contact night-coverage at [email or web address], password [email or web address]3/26/2017, 11:57 AM  LOS: 18 days

## 2015-11-29 NOTE — Progress Notes (Signed)
Subjective:  He continues to complain of chest wall soreness.  He has no ischemic chest pain.  Not currently short of breath.  Objective:  Vital Signs in the last 24 hours: BP 128/48 mmHg  Pulse 59  Temp(Src) 97.7 F (36.5 C) (Oral)  Resp 19  Ht 5\' 11"  (1.803 m)  Wt 65.363 kg (144 lb 1.6 oz)  BMI 20.11 kg/m2  SpO2 91%  Physical Exam: Older man lying in bed currently in no acute distress Lungs:  Basilar crackles, chest wall is tender.  Cardiac:  Regular rhythm, normal S1 and S2, no S3 Abdomen:  Soft, nontender, no masses Extremities:  No edema present  Intake/Output from previous day: 03/25 0701 - 03/26 0700 In: 320 [P.O.:320] Out: 725 [Urine:725] Weight Filed Weights   11/27/15 0251 11/28/15 0543 11/29/15 0523  Weight: 68.357 kg (150 lb 11.2 oz) 64.411 kg (142 lb) 65.363 kg (144 lb 1.6 oz)    Lab Results: Basic Metabolic Panel:  Recent Labs  11/28/15 0425 11/29/15 0439  NA 145 143  K 4.0 4.1  CL 104 102  CO2 34* 36*  GLUCOSE 107* 115*  BUN 27* 23*  CREATININE 1.83* 1.67*    CBC:  Recent Labs  11/27/15 0430 11/28/15 0425 11/29/15 0439  WBC 15.3* 19.1* 15.6*  NEUTROABS 3.4  --  3.1  HGB 8.3* 9.1* 8.4*  HCT 28.2* 29.6* 27.3*  MCV 91.0 91.1 91.3  PLT 114* 116* 113*    BNP    Component Value Date/Time   BNP 406.2* 11/16/2015 0353   BNP 5171* 03/13/2014 1109   Telemetry: Paced AV rhythm  Assessment/Plan:  1.  Acute on chronic systolic heart failure unsure whether this is acute due to sepsis but has not recovered or more chronic or could be due to chronic RV pacemaking. 2.  Recent pneumonia and sepsis 3.  History of V. fib arrest 4.  History of atrial fibrillation 5.  Long-term use of Xarelto 6.  Permanent pacemaker implantation 7.  Coronary artery disease with previous bypass grafting 8.  Acute renal failure that is resolving  Recommendations:  I think his chest pain is due to the previous CPR as he is tender in his chest and it is  constant.  He is a DO NOT RESUSCITATE and frankly isn't a great candidate for intervention due to his renal failure age and other comorbidities.  I will let the primary team decide if they want to be aggressive with cardiac catheterization but it may be best just to treat him medically at this time at his age and with other comorbidities.       Kerry Hough  MD [facility]  11/29/2015, 10:29 AM

## 2015-11-30 ENCOUNTER — Inpatient Hospital Stay (HOSPITAL_COMMUNITY): Payer: Medicare Other

## 2015-11-30 DIAGNOSIS — I5023 Acute on chronic systolic (congestive) heart failure: Secondary | ICD-10-CM

## 2015-11-30 DIAGNOSIS — J90 Pleural effusion, not elsewhere classified: Secondary | ICD-10-CM | POA: Insufficient documentation

## 2015-11-30 LAB — BASIC METABOLIC PANEL
ANION GAP: 9 (ref 5–15)
BUN: 26 mg/dL — ABNORMAL HIGH (ref 6–20)
CALCIUM: 8.9 mg/dL (ref 8.9–10.3)
CO2: 33 mmol/L — ABNORMAL HIGH (ref 22–32)
Chloride: 101 mmol/L (ref 101–111)
Creatinine, Ser: 1.9 mg/dL — ABNORMAL HIGH (ref 0.61–1.24)
GFR, EST AFRICAN AMERICAN: 35 mL/min — AB (ref >60)
GFR, EST NON AFRICAN AMERICAN: 30 mL/min — AB (ref >60)
GLUCOSE: 140 mg/dL — AB (ref 65–99)
POTASSIUM: 3.9 mmol/L (ref 3.5–5.1)
SODIUM: 143 mmol/L (ref 135–145)

## 2015-11-30 LAB — CBC
HEMATOCRIT: 32.4 % — AB (ref 39.0–52.0)
HEMOGLOBIN: 9.5 g/dL — AB (ref 13.0–17.0)
MCH: 26.8 pg (ref 26.0–34.0)
MCHC: 29.3 g/dL — ABNORMAL LOW (ref 30.0–36.0)
MCV: 91.3 fL (ref 78.0–100.0)
PLATELETS: 127 10*3/uL — AB (ref 150–400)
RBC: 3.55 MIL/uL — AB (ref 4.22–5.81)
RDW: 18.8 % — AB (ref 11.5–15.5)
WBC: 18.6 10*3/uL — ABNORMAL HIGH (ref 4.0–10.5)

## 2015-11-30 LAB — GLUCOSE, CAPILLARY
Glucose-Capillary: 97 mg/dL (ref 65–99)
Glucose-Capillary: 136 mg/dL — ABNORMAL HIGH (ref 65–99)
Glucose-Capillary: 159 mg/dL — ABNORMAL HIGH (ref 65–99)
Glucose-Capillary: 99 mg/dL (ref 65–99)

## 2015-11-30 LAB — PH, BODY FLUID: PH, BODY FLUID: 7.7

## 2015-11-30 LAB — PATHOLOGIST SMEAR REVIEW

## 2015-11-30 MED ORDER — FUROSEMIDE 40 MG PO TABS
40.0000 mg | ORAL_TABLET | Freq: Every day | ORAL | Status: DC
  Administered 2015-11-30 – 2015-12-01 (×2): 40 mg via ORAL
  Filled 2015-11-30 (×2): qty 1

## 2015-11-30 NOTE — Progress Notes (Addendum)
Patient Name: [name] [name] Date of Encounter: 11/30/2015  Primary Cardiologist: Thompson Grayer MD  SUBJECTIVE  Still having some chest tightness/soreness. No SOB.   CURRENT MEDS . amiodarone  200 mg Oral Daily  . antiseptic oral rinse  7 mL Mouth Rinse QID  . arformoterol  15 mcg Nebulization BID  . budesonide (PULMICORT) nebulizer solution  0.25 mg Nebulization BID  . chlorhexidine gluconate (SAGE KIT)  15 mL Mouth Rinse BID  . donepezil  5 mg Oral QHS  . feeding supplement (PRO-STAT SUGAR FREE 64)  30 mL Oral BID  . furosemide  80 mg Intravenous Daily  . guaiFENesin  600 mg Oral BID  . insulin aspart  0-15 Units Subcutaneous TID WC  . ipratropium-albuterol  3 mL Nebulization TID  . multivitamin with minerals  1 tablet Oral Daily  . omeprazole  40 mg Oral Daily  . polyethylene glycol  17 g Oral Daily  . rivaroxaban  15 mg Oral Q supper  . senna-docusate  2 tablet Oral BID  . simvastatin  10 mg Oral q1800  . tamsulosin  0.4 mg Oral Daily    OBJECTIVE  Filed Vitals:   11/30/15 0338 11/30/15 0914 11/30/15 0916 11/30/15 0920  BP: 109/61     Pulse: 71     Temp: 98 F (36.7 C)     TempSrc: Oral     Resp: 22     Height:      Weight: 142 lb 3.2 oz (64.501 kg)     SpO2: 95% 94% 95% 95%    Intake/Output Summary (Last 24 hours) at 11/30/15 0936 Last data filed at 11/30/15 0851  Gross per 24 hour  Intake    220 ml  Output   1026 ml  Net   -806 ml   Filed Weights   11/28/15 0543 11/29/15 0523 11/30/15 0338  Weight: 142 lb (64.411 kg) 144 lb 1.6 oz (65.363 kg) 142 lb 3.2 oz (64.501 kg)    PHYSICAL EXAM  General: Pleasant but chronically ill appearing NAD. Neuro: Alert and oriented X 3. Moves all extremities spontaneously. Psych: Normal affect. HEENT:  Normal  Neck: Supple without bruits or JVD. Lungs:  Resp regular and unlabored, Left sided faint rales.  Heart: RRR no s3, s4, or murmurs. Abdomen: Soft, non-tender, non-distended, BS + x 4.  Extremities: No  clubbing, cyanosis or edema. DP/PT/Radials 2+ and equal bilaterally.  Accessory Clinical Findings  CBC  Recent Labs  11/29/15 0439 11/30/15 0419  WBC 15.6* 18.6*  NEUTROABS 3.1  --   HGB 8.4* 9.5*  HCT 27.3* 32.4*  MCV 91.3 91.3  PLT 113* 794*   Basic Metabolic Panel  Recent Labs  11/29/15 0439 11/30/15 0419  NA 143 143  K 4.1 3.9  CL 102 101  CO2 36* 33*  GLUCOSE 115* 140*  BUN 23* 26*  CREATININE 1.67* 1.90*  CALCIUM 8.9 8.9    TELE  Intermittent paced rhythm  Radiology/Studies  Dg Chest 1 View  11/27/2015  CLINICAL DATA:  Right-sided pleural effusion status post thoracentesis EXAM: CHEST 1 VIEW COMPARISON:  11/26/2015 FINDINGS: No pneumothorax. Significant decrease in the size right pleural effusion. Trace bilateral pleural effusions remain. PICC line and cardiac pacer unchanged. Moderate cardiac enlargement stable. Stable aortic calcification. IMPRESSION: No pneumothorax status post thoracentesis. Electronically Signed   By: Skipper Cliche M.D.   On: 11/27/2015 15:06   Dg Chest 2 View  11/26/2015  CLINICAL DATA:  Short of breath.  Week. EXAM: CHEST  2 VIEW COMPARISON:  11/20/2015 FINDINGS: Right PICC stable. Do lead left subclavian pacemaker and leads are stable. Bilateral pleural effusions have increased. Right pleural effusion is larger than the left. Associated dependent atelectasis in lower lobes is associated. Acute left sixth rib fracture is now identified. It is minimally displaced. There is also deformity of the posterior and upper left third, fourth, and fifth ribs. IMPRESSION: Increasing bilateral pleural effusions. New left-sided rib fractures. At least 1 of them, the sixth, has an acute appearance. Electronically Signed   By: Marybelle Killings M.D.   On: 11/26/2015 07:47   Dg Chest 2 View  11/10/2015  CLINICAL DATA:  Weakness and difficulty walking since 4 p.m. yesterday. Initial encounter. EXAM: CHEST  2 VIEW COMPARISON:  PA and lateral chest 12/24/2012. CT  chest, abdomen and pelvis 10/24/2012. FINDINGS: There is cardiomegaly without edema. The chest is hyperexpanded. Minimal left basilar atelectasis is noted. No pneumothorax or pleural effusion is seen. No focal bony abnormality is identified. The patient is status post CABG with a pacing device in place. Remote left rib fractures are identified. IMPRESSION: Cardiomegaly without edema. Mild left basilar atelectasis. Emphysema. Electronically Signed   By: Inge Rise M.D.   On: 11/10/2015 08:40   Ct Chest Wo Contrast  11/26/2015  CLINICAL DATA:  Short of breath. EXAM: CT CHEST WITHOUT CONTRAST TECHNIQUE: Multidetector CT imaging of the chest was performed following the standard protocol without IV contrast. COMPARISON:  Chest x-ray 11/26/2015, chest CT 10/24/2012 FINDINGS: Moderate right pleural effusion with compressive atelectasis in the right lung base. Small left pleural effusion. COPD with mild emphysema. Advanced coronary calcification. Extensive aortic arch calcification. Cardiac enlargement with pacemaker. No pericardial effusion. Patchy density in the lingula similar to the prior study and likely due to scarring. Negative for pneumonia.  Negative for mass or adenopathy. Upper abdomen negative.  No acute skeletal abnormality. IMPRESSION: Bilateral pleural effusions right greater than left, possibly due to congestive heart failure. No edema is present. Cardiac enlargement with advanced coronary calcification Mild lingular scarring. COPD. Electronically Signed   By: Franchot Gallo M.D.   On: 11/26/2015 13:59   Dg Chest Port 1 View  11/20/2015  CLINICAL DATA:  Status post catheter placement. EXAM: PORTABLE CHEST 1 VIEW COMPARISON:  November 17, 2015. FINDINGS: Stable cardiomegaly. Left-sided pacemaker is unchanged in position. Interval placement of right-sided PICC line with distal tip in expected position of cavoatrial junction. No pneumothorax or pleural effusion is noted. Left lung appears clear. Mild  right basilar opacity is noted which is decreased compared to prior exam consistent with edema or pneumonia. IMPRESSION: Interval placement of right-sided PICC line with distal tip in expected position of cavoatrial junction. Decreased right basilar opacity is noted consistent with improving edema or pneumonia. Electronically Signed   By: Marijo Conception, M.D.   On: 11/20/2015 12:15   Dg Chest Port 1 View  11/17/2015  CLINICAL DATA:  82 year old with influenza. Ventilator dependent respiratory failure. Followup asymmetric pulmonary edema versus pneumonia. EXAM: PORTABLE CHEST 1 VIEW COMPARISON:  11/16/2015 and earlier. FINDINGS: Endotracheal tube tip in satisfactory position projecting approximately 3 cm above the carina. Nasogastric tube courses below the diaphragm into the stomach. Cardiac silhouette markedly enlarged. Left subclavian dual lead transvenous pacemaker unchanged. Interval worsening of asymmetric interstitial and airspace opacities in the right lung with new mild interstitial opacities in the left lung. Associated small bilateral pleural effusions. IMPRESSION: 1. Support apparatus satisfactory. 2. New with interstitial edema in the left lung with progressive  interstitial airspace opacities throughout the right lung, likely a combination of pulmonary edema and pneumonia. 3. Small bilateral pleural effusions. Electronically Signed   By: Evangeline Dakin M.D.   On: 11/17/2015 07:53   Dg Chest Port 1 View  11/16/2015  CLINICAL DATA:  Pneumonia. EXAM: PORTABLE CHEST 1 VIEW COMPARISON:  11/15/2015 and 11/14/2015 FINDINGS: Endotracheal tube and NG tube are in good position pacemaker in place. Chronic cardiomegaly. The pulmonary vascularity is normal. Improving hazy density at the right lung base could represent infiltrate or small right effusion. Lungs are hyperinflated consistent with COPD. IMPRESSION: Improving density at the right lung base, either small right effusion or faint right lower lobe  infiltrate. Electronically Signed   By: Lorriane Shire M.D.   On: 11/16/2015 07:32   Dg Chest Port 1 View  11/15/2015  CLINICAL DATA:  Respiratory failure EXAM: PORTABLE CHEST 1 VIEW COMPARISON:  11/14/2015 FINDINGS: Patchy opacities in the right upper lobe and right infrahilar regions, suspicious for pneumonia, possibly on the basis of aspiration. Asymmetric pulmonary edema is not excluded. This appearance is mildly improved in the upper lobe when compared to 11/13/2015, but is new from 11/10/2015. Endotracheal tube terminates 2 cm above the carina. Enteric tube courses below the diaphragm. Cardiomegaly. Postsurgical changes related to prior CABG. Left subclavian pacemaker. Old left rib fracture deformities.  Median sternotomy. IMPRESSION: Endotracheal tube terminates 2 cm above the carina. Multifocal patchy right lung opacities, mildly improved from 11/13/2015, favored to reflect aspiration pneumonia. Asymmetric pulmonary edema is not excluded. Electronically Signed   By: Julian Hy M.D.   On: 11/15/2015 07:24   Dg Chest Port 1 View  11/14/2015  CLINICAL DATA:  Cardiac arrest.  Intubation. EXAM: PORTABLE CHEST 1 VIEW COMPARISON:  11/10/2015 FINDINGS: Endotracheal tube 5.7 cm from the carina. Enteric tube tip below the diaphragm not included in field of view. Patient is post median sternotomy. Left-sided pacemaker in place. Cardiomegaly is unchanged. Development of diffuse perihilar and suprahilar right lung opacities, new from prior. Left lung grossly clear. No evidence of pneumothorax. Old left rib fractures. IMPRESSION: 1. Endotracheal tube 5.7 cm from the carina.  Enteric tube in place. 2. Development of right perihilar and suprahilar opacities, may reflect asymmetric pulmonary edema, pneumonia or aspiration. Cardiomegaly is stable. Electronically Signed   By: Jeb Levering M.D.   On: 11/14/2015 00:27   Dg Chest Port 1 View  11/10/2015  CLINICAL DATA:  Shortness breath, cough for 2-3 days  EXAM: PORTABLE CHEST 1 VIEW COMPARISON:  11/10/2015 FINDINGS: There is mild left basilar scarring. There is no focal consolidation. There is no pleural effusion or pneumothorax. There is stable cardiomegaly. There is prior CABG. There is a cardiac pacemaker present. The osseous structures are unremarkable. IMPRESSION: No active disease. Electronically Signed   By: Kathreen Devoid   On: 11/10/2015 13:43   Dg Chest Port 1v Same Day  11/14/2015  CLINICAL DATA:  Respiratory failure EXAM: PORTABLE CHEST 1 VIEW COMPARISON:  November 13, 2015 FINDINGS: The ETT terminates at the carina. Recommend withdrawing 3 cm. No pneumothorax. The right-sided pulmonary opacity seen previously has improved but persists. This could represent asymmetric edema versus infectious process. Cardiomegaly again identified. No other acute abnormalities. IMPRESSION: 1. The ETT terminates at the carina.  Recommend withdrawing 3 cm. 2. The right-sided pulmonary opacity has improved but persist. Recommend follow-up to resolution. These results will be called to the ordering clinician or representative by the Radiologist Assistant, and communication documented in the PACS or zVision Dashboard.  Electronically Signed   By: Dorise Bullion III M.D   On: 11/14/2015 11:12   Dg Abd Portable 1v  11/14/2015  CLINICAL DATA:  Orogastric tube placement. EXAM: PORTABLE ABDOMEN - 1 VIEW COMPARISON:  10/24/2012 FINDINGS: Tip and side port of the enteric tube below the diaphragm in the stomach, however looped and tip directed towards the fundus. Air-filled transverse colon. No definite small bowel dilatation. IMPRESSION: Enteric tube below the stomach, however tip directed towards the fundus. Nonobstructive bowel gas pattern. Electronically Signed   By: Jeb Levering M.D.   On: 11/14/2015 00:28   US Thoracentesis Asp Pleural Space W/img Guide  11/27/2015  INDICATION: Shortness of breath. Pleural effusion noted on recent CT scan. Request diagnostic and  therapeutic thoracentesis. EXAM: ULTRASOUND GUIDED RIGHT THORACENTESIS MEDICATIONS: None. COMPLICATIONS: None immediate. PROCEDURE: An ultrasound guided thoracentesis was thoroughly discussed with the patient and questions answered. The benefits, risks, alternatives and complications were also discussed. The patient understands and wishes to proceed with the procedure. Written consent was obtained. Ultrasound was performed to localize and Trevor Wilkie an adequate pocket of fluid in the right chest. The area was then prepped and draped in the normal sterile fashion. 1% Lidocaine was used for local anesthesia. Under ultrasound guidance a Safe-T-Centesis catheter was introduced. Thoracentesis was performed. The catheter was removed and a dressing applied. FINDINGS: A total of approximately 1.6 L of hazy, blood-tinged fluid was removed. Samples were sent to the laboratory as requested by the clinical team. IMPRESSION: Successful ultrasound guided right thoracentesis yielding 1.6 L of pleural fluid. Read by: Ascencion Dike PA-C Electronically Signed   By: Sandi Mariscal M.D.   On: 11/27/2015 15:03    ASSESSMENT AND PLAN  82 y.o.white male with atrial fibrillation on coumadin, second degree AV block Mobitz II s/p St. Jude's DDD dual chamber PPM (basal rate 60 bpm), history of CAD s/p CABG, HTN, HLD, COPD, chronic diastolic CHF and thrombocytopenia who presented with acute respiratory failure on 11/10/2015. Patient is admitted under internal medicine service with acute hypoxemic respiratory failure secondary to acute influenza A virus and was found to have GPC bacteremia and is being treated with Abx. Patient on admission was having ectopy and was found to have new low LVEF of 30-35% on 3/9 echo. On 3/10 at 2241 hours patient was found to have polymorphic VT on the monitor. He became unresponsive and pulseless per report. Patient received CPR, 3 rounds of epinephrine and was shocked x 2. He was also given a dose of bicarb.  Amiodarone bolus and drip were started. ROSC was obtained after 10 minutes.   VF arrest - unclear etiology but suspected CAD related  - Post resuscitation EKG does not demonstrate ST elevation, however suspect CAD as etiology. Peak of Troponin was 7.34 then trended down.  - Continue PO amiodarone  Acute systolic cardiomyopathy (New onset cardiomyopathy) - echoshowed LVEF of 30-35%, new since 2010 - Cath was on hold due to risk higher than benefit.  - No BB at present due to bradycardia.Not on ACE/ARB due to elevated Scr. On lV lasix 66m qd, Scr increased from 1.67-->1.9. Diuresed -886cc yesterday however total net I&O of +7.2L.  - Seems not a good candidate for cath given AKI, low platelets, advanced age other comorbidity. Plan to discharge to rehab later today or tomorrow. Will review discharge meds with MD.    Hx of afib and heart block s/p pacemaker - Continue Xarelto  Moderate Pulmonary hypertension - PASP 59 mmhg, treat HF  Dispo: will need close outpatient f/u.     Acute systolic heart failure (HCC)   Sepsis due to pneumonia ([place])   Acute encephalopathy   Influenza with pneumonia   Hypotension   Protein-calorie malnutrition, severe ([place])   Cardiac arrest ([place])   Polymorphic ventricular tachycardia (HCC)   Pressure ulcer   Encounter for imaging study to confirm orogastric (OG) tube placement   Endotracheally intubated   Bacteremia due to Streptococcus   Pleural effusion on right   Acute on chronic systolic congestive heart failure ([place])   Signed, Bhagat,Bhavinkumar PA-C Pager [phone]  Personally seen and examined. Agree with above.  Polymorphic VT in hospital with resuscitation. EF 35%. On amiodarone. No beta blocker secondary to bradycardia. Not felt to be a cardiac catheterization candidate, risk outweighs benefit. Pacemaker functioning well. Xarelto. Was on Lasix IV 80 mg once a day. Given his increasing creatinine from 1.6-1.9, I will change to by mouth 40 mg  once a day.  I'm comfortable with discharge from cardiac perspective.  Understands high mortality risk. His son asked the question if he had a defibrillator, I reviewed records and he does not have a defibrillator. His son does have a defibrillator.  Candee Furbish, MD

## 2015-11-30 NOTE — Progress Notes (Signed)
Physical Therapy Treatment Patient Details Name: [name] [name] MRN: [id number] DOB: [birth year] Today's Date: 11/30/2015    History of Present Illness presented with fever and dyspnea. r/o sepsis. +flu and CHF exacerbation PMHx-HIV, Hepatitis C, CAD, CHF, afib, dementia, COPD. Pt s/p cardiac arrest 3/10 with CPR for 8 mins requiring intubation 3/10-3/15.     PT Comments    Pt making gradual progress with mobility. Activity limited by reports of fatigue during session. Based upon the patient's current mobility, recommending SNF at D/C. Pt able to ambulate 12 feet with rw and min assistance. Will continue to follow and progress as tolerated.   Follow Up Recommendations  SNF     Equipment Recommendations  Rolling walker with 5" wheels    Recommendations for Other Services       Precautions / Restrictions Precautions Precautions: Fall Precaution Comments: Multiple falls PTA. Watch 02 sats, chest sore from CPR Restrictions Weight Bearing Restrictions: No    Mobility  Bed Mobility Overal bed mobility: Needs Assistance Bed Mobility: Supine to Sit     Supine to sit: Mod assist     General bed mobility comments: HOB elevated, cues provided for sequence. Mod assist provided at trunk to come to sitting.   Transfers Overall transfer level: Needs assistance Equipment used: Rolling walker (2 wheeled) Transfers: Sit to/from Stand Sit to Stand: Min assist         General transfer comment: Repeated X2 with first transfer resulting in standing EOB before returning to sitting for rest. Cues for hand position with transfer. Pt with posterior lean with standing.   Ambulation/Gait Ambulation/Gait assistance: Min assist Ambulation Distance (Feet): 12 Feet Assistive device: Rolling walker (2 wheeled) Gait Pattern/deviations: Step-through pattern;Decreased step length - right;Decreased step length - left Gait velocity: decreased   General Gait Details: Using O2 throughout session,  SpO2 dropping to 89% with initial standing, returning to 93% with deep breathing. Following ambulation SpO2 92%. Pt requesing to sit after 12 feet due to fatigue.    Stairs            Wheelchair Mobility    Modified Rankin (Stroke Patients Only)       Balance Overall balance assessment: Needs assistance Sitting-balance support: Single extremity supported Sitting balance-Leahy Scale: Poor     Standing balance support: Bilateral upper extremity supported Standing balance-Leahy Scale: Poor Standing balance comment: posterior lean with standing.                     Cognition Arousal/Alertness: Awake/alert Behavior During Therapy: WFL for tasks assessed/performed Overall Cognitive Status: Within Functional Limits for tasks assessed                      Exercises      General Comments General comments (skin integrity, edema, etc.): Daughter and son-in-law present for session. Condom catheter coming off during initial stand, nursing notified.        Pertinent Vitals/Pain Pain Assessment: Faces Faces Pain Scale: Hurts a little bit Pain Location: chest Pain Descriptors / Indicators: Sore Pain Intervention(s): Monitored during session    Home Living                      Prior Function            PT Goals (current goals can now be found in the care plan section) Acute Rehab PT Goals Patient Stated Goal: none stated PT Goal Formulation: With patient/family Time For Goal  Achievement: 12/14/15 Potential to Achieve Goals: Fair Progress towards PT goals: Progressing toward goals    Frequency  Min 3X/week    PT Plan Current plan remains appropriate    Co-evaluation             End of Session Equipment Utilized During Treatment: Gait belt;Oxygen Activity Tolerance: Patient limited by fatigue Patient left: in chair;with call bell/phone within reach;with family/visitor present     Time: 1224-1253 PT Time Calculation (min) (ACUTE  ONLY): 29 min  Charges:  $Gait Training: 8-22 mins $Therapeutic Activity: 8-22 mins                    G Codes:      Cassell Clement, PT, CSCS Pager [phone] Office [phone]  11/30/2015, 1:17 PM

## 2015-11-30 NOTE — Progress Notes (Signed)
Nutrition Follow-up  DOCUMENTATION CODES:   Severe malnutrition in context of acute illness/injury  INTERVENTION:  -Continue Prostat BID, 100 kcal, 15 grams of protein per 30 ml  -Provide daily snack   NUTRITION DIAGNOSIS:   Malnutrition related to acute illness as evidenced by moderate depletions of muscle mass, severe depletion of muscle mass, energy intake < or equal to 50% for > or equal to 5 days.  -Ongoing   GOAL:   Patient will meet greater than or equal to 90% of their needs  -Progressing  MONITOR:   Vent status, TF tolerance, Labs, I & O's  ASSESSMENT:   82 year old male with past medical history of CAD, diastolic CHF, atrial fibrillation, and COPD who presented to the emergency room on 3/7 after several days of generalized weakness and confusion plus fever and productive cough with brown sputum. Influenza A cultures positive as were blood cultures for gram-positive cocci in chains.   Pt states he's appetite is good. Per chart pt is consuming 0-100% of meals. PTA pt usually consumes 1 meal daily. He is taking Prostat BID. Pt requests applesauce with every meal; will provide.   Per chart weight stable.   Medications reviewed; multivitamin with minerals tablet 1 daily Labs reviewed  Diet Order:  Diet NPO time specified  Skin:   (Stage I pressure injury on sacrum)  Last BM:  11/29/2015  Height:   Ht Readings from Last 1 Encounters:  11/18/15 5\' 11"  (1.803 m)    Weight:   Wt Readings from Last 1 Encounters:  11/30/15 142 lb 3.2 oz (64.501 kg)    Ideal Body Weight:  72.7 kg  BMI:  Body mass index is 19.84 kg/(m^2).  Estimated Nutritional Needs:   Kcal:  1600-1800  Protein:  95-110 grams  Fluid:  1.6-1.8 L/day  EDUCATION NEEDS:   No education needs identified at this time  [place], Dietetic Intern Pager: [phone]

## 2015-11-30 NOTE — Care Management Note (Addendum)
Case Management Note  Patient Details  Name: [name] [name] MRN: [id number] Date of Birth: 10/22/1925  Subjective/Objective:   afib s/p pacemaker               Action/Plan: Discharge Planning:  3:40 pm  NCM spoke to pt's [place], Dawayne Patricia # [phone], # [phone]. Pt scheduled to go to [facility] Spoke to [employer] authorization.    Expected Discharge Date:  12/01/2015             Expected Discharge Plan:  Skilled Nursing Facility  In-House Referral:  Clinical Social Work  Discharge planning Services  CM Consult  Post Acute Care Choice:  NA Choice offered to:  NA  DME Arranged:  N/A DME Agency:  NA  HH Arranged:  NA HH Agency:  NA  Status of Service:  Completed, signed off  Medicare Important Message Given:    Date Medicare IM Given:    Medicare IM give by:    Date Additional Medicare IM Given:    Additional Medicare Important Message give by:     If discussed at [place] of Stay Meetings, dates discussed:    Additional Comments:  Erenest Rasher, RN 11/30/2015, 6:38 PM

## 2015-11-30 NOTE — Care Management Important Message (Signed)
Important Message  Patient Details  Name: [name] [name] MRN: [id number] Date of Birth: [birth year]   Medicare Important Message Given:  Yes    Erenest Rasher, RN 11/30/2015, 6:44 PM

## 2015-11-30 NOTE — Progress Notes (Signed)
Per Joycelyn Schmid, Infection Control, ok to discontinue droplet precautions, as pt has been here since November 10, 2015, and has completed Tamiflu administration on November 14, 2015.

## 2015-11-30 NOTE — Progress Notes (Signed)
[facility] PROGRESS NOTE  [name] [name] VQX:[id number] DOB: [birth year] DOA: 11/10/2015 PCP: Odette Fraction, MD  HPI/Brief narrative Please see admission H&P from 11/10/2015. Briefly the patient is an 82 year old male with a history of COPD, CAD status post CABG, A. fib, dementia who was initially admitted with acute proxy make rest 3 failure secondary to influenza a. During the course, the patient was noted to have worsening respiratory distress. He later had nonsustained V. tach and later found to be unresponsive and pulseless. Patient was intubated and transferred to ICU on 3/10. Patient since been successfully extubated on 11/18/2015 and care transferred to the hospitalist service  Assessment/Plan: 1. Acute hypoxemic respiratory failure/vfib arrest on 3/10/systolic CHF/pleural effusion 1. Successfully extubated on 3/15. Stable at present 2. Patient on scheduled IV lasix given CXR findings worrisome for pulm edema  3. Due to persistent oxygen requirment, repeat cxr on 3/23 showed worsening of pleural effusion, chest ct did review bilateral pleural effusion, right >left, IR consulted for US guided thoracentesis, 3/24 1.6L pleural effusion removed, transudative, gram stain no organism. fluids culture no growth, Diuretics per cardiology 4. Repeat echo ordered by cardiology on 3/24 with reduced lvef 25-30%, cardiology to decide not to proceed with cath, discharge to snf with diuretics. 2. NSVT 1. Pt continued on amiodarone, transition to PO 2. Remains stable 3. Hx afib and heart block s/p pacemaker 1. Rate controlled, paced rhythm 2. CHADS-VASc 2, previously on xarelto. Was on heparin gtt during early hospital stay. Transitioned back to xaralto on 3/21, monitor cr, cardiology consulted for medication optimiztion 4.   Acute on chronic systolic CHF , ef 00-34% on 3/9, repeat echo on 3/24, ef 25-30%.  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 Baseline Cr around 1.2 2. suspect secondary to end-organ damage from shock vs sepsis 3. Cr peaked at 2.25, though still fluctuating, overall slowing improving 6. Thrombocytopenia 1. Possibly related to heparin vs end-organ damage as plts noted to be 51k on day of admit 2. improving 7. Flu A with sepsis present on admission, treated 8. Viridians strep bacteremia 1. Had been on empiric vanc and rocephin. Now on rocephin alone based on sensitivities, TEE on 3/15 no vegetation  2. Discussed with ID. Recs for 10 days of tx. Completed course of abx on 3/20 (14 days total) 3. Remains afebrile, repeat blood culture no growth 9. Anemia; normocytic, close to baseline, likely anemia of chronic disease, no active gi bleed observed. transfuse 1 prbc on 3/23 due to hgb trending down 10. Leukocytosis: better, consider outpatient hematology referral if persists 11. Dementia 1. Stable, on aricept, monitor bradycardia 12. DVT prophylaxis 1. On xarelto  Code Status: DNR, code status was discussed with family by icu attending Family Communication: Pt in room, sister over the phone on 3/23, daughter at bedside on 3/24, 3/25 and 3/26 Disposition Plan:  d/c to snf on 3/28,    Consultants:  Critical Care  Cardiology  IR  Procedures:  Intubation 3/10 and extubation 3/15  Code blue, CPR, defibrillation on 3/10  picc line placement  TEE 3/15  Thoracentesis for pleural effusion on 3/24  Antibiotics: Anti-infectives    Start     Dose/Rate Route Frequency Ordered Stop   11/16/15 1545  vancomycin (VANCOCIN) IVPB 750 mg/150 ml premix  Status:  Discontinued     750 mg 150 mL/hr over  60 Minutes Intravenous Every 24 hours 11/16/15 1543 11/18/15 1503   11/15/15 0000  vancomycin (VANCOCIN) 1,250 mg in sodium chloride 0.9 % 250 mL IVPB  Status:  Discontinued     1,250 mg 166.7 mL/hr over 90 Minutes  Intravenous Every 24 hours 11/14/15 0653 11/15/15 1527   11/13/15 2345  cefTRIAXone (ROCEPHIN) 2 g in dextrose 5 % 50 mL IVPB  Status:  Discontinued     2 g 100 mL/hr over 30 Minutes Intravenous Daily at bedtime 11/13/15 2327 11/23/15 1450   11/12/15 0900  cefTRIAXone (ROCEPHIN) 2 g in dextrose 5 % 50 mL IVPB  Status:  Discontinued     2 g 100 mL/hr over 30 Minutes Intravenous Every 24 hours 11/11/15 1323 11/11/15 1433   11/11/15 2200  oseltamivir (TAMIFLU) 6 MG/ML suspension 30 mg     30 mg Oral 2 times daily 11/11/15 1340 11/14/15 2152   11/11/15 1400  cefTRIAXone (ROCEPHIN) 1 g in dextrose 5 % 50 mL IVPB  Status:  Discontinued     1 g 100 mL/hr over 30 Minutes Intravenous  Once 11/11/15 1324 11/11/15 1433   11/11/15 1200  azithromycin (ZITHROMAX) 500 mg in dextrose 5 % 250 mL IVPB  Status:  Discontinued     500 mg 250 mL/hr over 60 Minutes Intravenous Every 24 hours 11/10/15 1318 11/11/15 1433   11/11/15 0900  cefTRIAXone (ROCEPHIN) 1 g in dextrose 5 % 50 mL IVPB  Status:  Discontinued     1 g 100 mL/hr over 30 Minutes Intravenous Every 24 hours 11/10/15 1318 11/11/15 1323   11/11/15 0600  vancomycin (VANCOCIN) IVPB 1000 mg/200 mL premix  Status:  Discontinued     1,000 mg 200 mL/hr over 60 Minutes Intravenous Every 24 hours 11/11/15 0528 11/14/15 0653   11/10/15 1500  oseltamivir (TAMIFLU) 6 MG/ML suspension 75 mg  Status:  Discontinued     75 mg Oral 2 times daily 11/10/15 1448 11/11/15 1340   11/10/15 1130  azithromycin (ZITHROMAX) 500 mg in dextrose 5 % 250 mL IVPB     500 mg 250 mL/hr over 60 Minutes Intravenous  Once 11/10/15 1127 11/10/15 1319   11/10/15 0830  cefTRIAXone (ROCEPHIN) 1 g in dextrose 5 % 50 mL IVPB     1 g 100 mL/hr over 30 Minutes Intravenous  Once 11/10/15 0828 11/10/15 1222      HPI/Subjective: on 2liter oxygen,  Less congested cough, chest wall tenderness has improved, baseline dementia, not oriented to time,   Objective: Filed Vitals:   11/30/15 0916  11/30/15 0920 11/30/15 1144 11/30/15 1413  BP:   132/41   Pulse:   71   Temp:   98.5 F (36.9 C)   TempSrc:   Oral   Resp:   20   Height:      Weight:      SpO2: 95% 95% 92% 93%    Intake/Output Summary (Last 24 hours) at 11/30/15 1850 Last data filed at 11/30/15 1835  Gross per 24 hour  Intake    200 ml  Output    801 ml  Net   -601 ml   Filed Weights   11/28/15 0543 11/29/15 0523 11/30/15 0338  Weight: 64.411 kg (142 lb) 65.363 kg (144 lb 1.6 oz) 64.501 kg (142 lb 3.2 oz)    Exam:   General:  Awake, frail, in nad, picc line on right arm  Cardiovascular: paced rhythm  Respiratory: improved areation, no wheezing  Abdomen: soft, nondistended, pos BS  Musculoskeletal: perfused, no clubbing, no cyanosis, no edema  Neuro; baseline dementia  Data Reviewed: Basic Metabolic Panel:  Recent Labs Lab 11/26/15 0520 11/27/15 0430 11/28/15 0425 11/29/15 0439 11/30/15 0419  NA 145 145 145 143 143  K 4.0 4.1 4.0 4.1 3.9  CL 105 107 104 102 101  CO2 33* 33* 34* 36* 33*  GLUCOSE 99 106* 107* 115* 140*  BUN 31* 30* 27* 23* 26*  CREATININE 1.64* 1.48* 1.83* 1.67* 1.90*  CALCIUM 9.0 8.8* 8.8* 8.9 8.9   Liver Function Tests: No results for input(s): AST, ALT, ALKPHOS, BILITOT, PROT, ALBUMIN in the last 168 hours. No results for input(s): LIPASE, AMYLASE in the last 168 hours. No results for input(s): AMMONIA in the last 168 hours. CBC:  Recent Labs Lab 11/26/15 0520 11/27/15 0430 11/28/15 0425 11/29/15 0439 11/30/15 0419  WBC 17.6* 15.3* 19.1* 15.6* 18.6*  NEUTROABS  --  3.4  --  3.1  --   HGB 7.5* 8.3* 9.1* 8.4* 9.5*  HCT 26.0*  26.5* 28.2* 29.6* 27.3* 32.4*  MCV 91.9 91.0 91.1 91.3 91.3  PLT 117* 114* 116* 113* 127*   Cardiac Enzymes:  Recent Labs Lab 11/26/15 1250 11/26/15 1827 11/26/15 2315  TROPONINI 0.05* 0.06* 0.06*   BNP (last 3 results)  Recent Labs  11/10/15 1456 11/16/15 0353  BNP 1792.8* 406.2*    ProBNP (last 3 results) No  results for input(s): PROBNP in the last 8760 hours.  CBG:  Recent Labs Lab 11/29/15 1622 11/29/15 2117 11/30/15 0643 11/30/15 1142 11/30/15 1633  GLUCAP 94 142* 97 136* 159*    Recent Results (from the past 240 hour(s))  Culture, body fluid-bottle     Status: None (Preliminary result)   Collection Time: 11/27/15  3:40 PM  Result Value Ref Range Status   Specimen Description FLUID RIGHT PLEURAL  Final   Special Requests BOTTLES DRAWN AEROBIC AND ANAEROBIC 10CC  Final   Culture NO GROWTH 3 DAYS  Final   Report Status PENDING  Incomplete  Gram stain     Status: None   Collection Time: 11/27/15  3:40 PM  Result Value Ref Range Status   Specimen Description FLUID RIGHT PLEURAL  Final   Special Requests NONE  Final   Gram Stain   Final    MODERATE WBC PRESENT,BOTH PMN AND MONONUCLEAR NO ORGANISMS SEEN    Report Status 11/27/2015 FINAL  Final     Studies: Dg Chest 2 View  11/30/2015  CLINICAL DATA:  Patient with cough for 3 days.  Weakness. EXAM: CHEST  2 VIEW COMPARISON:  Chest radiograph 11/27/2015. FINDINGS: Right upper extremity PICC line is present with tip terminating in the superior vena cava. Left anterior chest wall pacer apparatus, leads stable in position. Stable cardiomegaly status post median sternotomy and CABG procedure. Small layering bilateral pleural effusions. Underlying pulmonary consolidation. No pneumothorax. IMPRESSION: Stable support apparatus. Cardiomegaly. Layering small bilateral pleural effusions with underlying consolidation suggestive of atelectasis. Infection not excluded. Electronically Signed   By: Lovey Newcomer M.D.   On: 11/30/2015 11:11    Scheduled Meds: . amiodarone  200 mg Oral Daily  . antiseptic oral rinse  7 mL Mouth Rinse QID  . arformoterol  15 mcg Nebulization BID  . budesonide (PULMICORT) nebulizer solution  0.25 mg Nebulization BID  . chlorhexidine gluconate (SAGE KIT)  15 mL Mouth Rinse BID  . donepezil  5 mg Oral QHS  . feeding  supplement (PRO-STAT SUGAR FREE 64)  30 mL Oral BID  .  furosemide  40 mg Oral Daily  . guaiFENesin  600 mg Oral BID  . insulin aspart  0-15 Units Subcutaneous TID WC  . ipratropium-albuterol  3 mL Nebulization TID  . multivitamin with minerals  1 tablet Oral Daily  . omeprazole  40 mg Oral Daily  . polyethylene glycol  17 g Oral Daily  . rivaroxaban  15 mg Oral Q supper  . senna-docusate  2 tablet Oral BID  . simvastatin  10 mg Oral q1800  . tamsulosin  0.4 mg Oral Daily   Continuous Infusions:    Active Problems:   Acute systolic heart failure (HCC)   Sepsis due to pneumonia ([place])   Acute encephalopathy   Influenza with pneumonia   Hypotension   Protein-calorie malnutrition, severe ([place])   Cardiac arrest ([place])   Polymorphic ventricular tachycardia (HCC)   Pressure ulcer   Encounter for imaging study to confirm orogastric (OG) tube placement   Endotracheally intubated   Bacteremia due to Streptococcus   Pleural effusion on right   Acute on chronic systolic congestive heart failure (HCC)   Time 17mns  , MD PhD  [facility] Pager [phone] If 7PM-7AM, please contact night-coverage at [email or web address], password [email or web address]3/27/2017, 6:50 PM  LOS: 19 days

## 2015-11-30 NOTE — Progress Notes (Signed)
RT called RN to the room with concerns of consulting the MD. Patient appears to have some belly breathing, however, O2 sats are with normal range at this time. RT will continue to monitor.

## 2015-12-01 LAB — CBC
HCT: 32.1 % — ABNORMAL LOW (ref 39.0–52.0)
Hemoglobin: 9.6 g/dL — ABNORMAL LOW (ref 13.0–17.0)
MCH: 27.8 pg (ref 26.0–34.0)
MCHC: 29.9 g/dL — AB (ref 30.0–36.0)
MCV: 93 fL (ref 78.0–100.0)
PLATELETS: 125 10*3/uL — AB (ref 150–400)
RBC: 3.45 MIL/uL — ABNORMAL LOW (ref 4.22–5.81)
RDW: 19.5 % — AB (ref 11.5–15.5)
WBC: 19.6 10*3/uL — AB (ref 4.0–10.5)

## 2015-12-01 LAB — BASIC METABOLIC PANEL
ANION GAP: 8 (ref 5–15)
BUN: 29 mg/dL — ABNORMAL HIGH (ref 6–20)
CALCIUM: 8.8 mg/dL — AB (ref 8.9–10.3)
CO2: 34 mmol/L — ABNORMAL HIGH (ref 22–32)
CREATININE: 2.09 mg/dL — AB (ref 0.61–1.24)
Chloride: 101 mmol/L (ref 101–111)
GFR, EST AFRICAN AMERICAN: 31 mL/min — AB (ref >60)
GFR, EST NON AFRICAN AMERICAN: 27 mL/min — AB (ref >60)
GLUCOSE: 129 mg/dL — AB (ref 65–99)
Potassium: 4.3 mmol/L (ref 3.5–5.1)
Sodium: 143 mmol/L (ref 135–145)

## 2015-12-01 LAB — GLUCOSE, CAPILLARY
GLUCOSE-CAPILLARY: 123 mg/dL — AB (ref 65–99)
GLUCOSE-CAPILLARY: 116 mg/dL — AB (ref 65–99)
Glucose-Capillary: 127 mg/dL — ABNORMAL HIGH (ref 65–99)
Glucose-Capillary: 133 mg/dL — ABNORMAL HIGH (ref 65–99)

## 2015-12-01 NOTE — Progress Notes (Signed)
Attempted to give report to receiving nurse at [facility] [phone]; was on hold for 5 minutes and no one answered.

## 2015-12-01 NOTE — Progress Notes (Signed)
CSW faxed updated clinicals to [facility]. Awaiting insurance authorization. CSW also attempted to get in contact with facility representative Frederic Jericho regarding patient. However received no answer. CSW left voice message at 9:17am. CSW will continue to follow.   Lucius Conn, [place] Worker [facility] Ph: [phone]

## 2015-12-01 NOTE — Progress Notes (Signed)
Pt's family member called asking when ambulance will pick up pt as pt has been sitting in the recliner for long time.  Instructed will call SW to let her know pt has been waiting for a long time.  Call placed to SW. No answer.  Message left on answering machine to call.  Will continue to monitor.  Karie Kirks, Therapist, sports.

## 2015-12-01 NOTE — Clinical Social Work Placement (Signed)
   CLINICAL SOCIAL WORK PLACEMENT  NOTE  Date:  12/01/2015  Patient Details  Name: [name] [name] MRN: [id number] Date of Birth: Mar 09, 1926  Clinical Social Work is seeking post-discharge placement for this patient at the [place] level of care (*CSW will initial, date and re-position this form in  chart as items are completed):  Yes   Patient/family provided with [place] Work Department's list of facilities offering this level of care within the geographic area requested by the patient (or if unable, by the patient's family).  Yes   Patient/family informed of their freedom to choose among providers that offer the needed level of care, that participate in Medicare, Medicaid or managed care program needed by the patient, have an available bed and are willing to accept the patient.      Patient/family informed of [facility]'s ownership interest in [facility] and [facility], as well as of the fact that they are under no obligation to receive care at these facilities.  PASRR submitted to EDS on 12/01/15     PASRR number received on 12/01/15     Existing PASRR number confirmed on       FL2 transmitted to all facilities in geographic area requested by pt/family on 11/27/15     FL2 transmitted to all facilities within larger geographic area on 11/27/15     Patient informed that his/her managed care company has contracts with or will negotiate with certain facilities, including the following:        Yes   Patient/family informed of bed offers received.  Patient chooses bed at  [facility])     Physician recommends and patient chooses bed at      Patient to be transferred to  [facility]) on 12/01/15.  Patient to be transferred to facility by  [facility])     Patient family notified on 12/01/15 of transfer.  Name of family member notified:  Patient is aware and will inform family      PHYSICIAN       Additional Comment:     _______________________________________________ Raymondo Band, LCSW 12/01/2015, 11:16 AM

## 2015-12-01 NOTE — Progress Notes (Signed)
Patient transferred to [facility] healthcare via EMS.

## 2015-12-01 NOTE — Discharge Summary (Addendum)
Discharge Summary  [name] [name] [id number] DOB: 1926/06/04  PCP: Odette Fraction, MD  Admit date: 11/10/2015 Discharge date: 12/01/2015  Time spent: >81mins  Recommendations for Outpatient Follow-up:  1. F/u with PMD at SNF repeat cbc/bmp in three days, consider refer to hematology if persistent leukocytosis 2. F/u with cardiology on 4/4  Discharge Diagnoses:  Active Hospital Problems   Diagnosis Date Noted  . Pleural effusion   . Pleural effusion on right   . Acute on chronic systolic congestive heart failure ([place])   . Bacteremia due to Streptococcus   . Encounter for imaging study to confirm orogastric (OG) tube placement   . Endotracheally intubated   . Pressure ulcer 11/14/2015  . Cardiac arrest ([place])   . Polymorphic ventricular tachycardia ([place])   . Protein-calorie malnutrition, severe ([place]) 11/13/2015  . Acute systolic heart failure ([place]) 11/10/2015  . Sepsis due to pneumonia ([place]) 11/10/2015  . Acute encephalopathy   . Influenza with pneumonia   . Hypotension     Resolved Hospital Problems   Diagnosis Date Noted Date Resolved  No resolved problems to display.    Discharge Condition: stable  Diet recommendation: heart healthy  Filed Weights   11/29/15 0523 11/30/15 0338 12/01/15 0426  Weight: 65.363 kg (144 lb 1.6 oz) 64.501 kg (142 lb 3.2 oz) 64.864 kg (143 lb)    History of present illness:  [name] [name] is a 82 y.o. male With a history of chronic leukocytosis on observation, COPD brought to the ED with generalized weakness, and confusion per family report. He had fevers up to 102, and brown productive cough. No hemoptysis. No recent pneumonia. He was exposed to Flu last week though sister. No chest pain. No nausea, vomiting or diarrhea. At the time of the visit he is somewhat confused, removing His IV lines. Sister reports that he is "a little worse than usual" but he has chronic dementia. [name], who lives with patient, reports patient  having left eye droop yesterday, with numb tongue that never went away, disoriented, and with an episode of soiling himself"   At the ED, he is febrile at 102.2, BP normal, tachycardic, Osats 97 on RA, normal on O2. Chest x-ray is negative for acute cardiopulmonary disease. Cultures pending. WBC is elevated at 14.4. Lactic acid 1.36. otherwise BMET normal Flu PCR pending. UA negative. Cultures pending. He was placed prophilactically on Azithromycin and Rocephin, given IVF, antipyretics, nebs and steroids   Hospital Course:  Active Problems:   Acute systolic heart failure (HCC)   Sepsis due to pneumonia [facility])   Acute encephalopathy   Influenza with pneumonia   Hypotension   Protein-calorie malnutrition, severe ([place])   Cardiac arrest ([place])   Polymorphic ventricular tachycardia (HCC)   Pressure ulcer   Encounter for imaging study to confirm orogastric (OG) tube placement   Endotracheally intubated   Bacteremia due to Streptococcus   Pleural effusion on right   Acute on chronic systolic congestive heart failure (HCC)   Pleural effusion  HPI/Brief narrative Please see admission H&P from 11/10/2015. Briefly the patient is an 82 year old male with a history of COPD, CAD status post CABG, A. fib, dementia who was initially admitted with acute proxy make rest 3 failure secondary to influenza a. During the course, the patient was noted to have worsening respiratory distress. He later had nonsustained V. tach and later found to be unresponsive and pulseless. Patient was intubated and transferred to ICU on 3/10. Patient since been successfully extubated on 11/18/2015  and care transferred to the hospitalist service  Assessment/Plan:  Acute hypoxemic respiratory failure/vfib arrest on 3/10/systolic CHF/pleural effusion  Successfully extubated on 3/15. Stable at present  Patient on scheduled IV lasix given CXR findings worrisome for pulm edema   Due to persistent oxygen requirment, repeat cxr on  3/23 showed worsening of pleural effusion, chest ct did review bilateral pleural effusion, right >left, IR consulted for US guided thoracentesis, 3/24 1.6L pleural effusion removed, transudative, gram stain no organism. fluids culture no growth, Diuretics per cardiology  Repeat echo ordered by cardiology on 3/24 with reduced lvef 25-30%, cardiology to decide not to proceed with cath, discharge to snf with diuretics and close outpatient cardiology follow up on 4/4.  He is discharged on 2liter oxygen 24/7, continue to wean oxygen at SNF  NSVT  Pt continued on amiodarone, transition to PO  Remains stable  Hx afib and heart block s/p pacemaker  Rate controlled, paced rhythm  CHADS-VASc 2, previously on xarelto. Was on heparin gtt during early hospital stay. Transitioned back to xaralto on 3/21, monitor cr, cardiology consulted for medication optimiztion   Acute on chronic systolic CHF , ef [id number] on 3/9, repeat echo on 3/24, ef 25-30%. increase lasix, not on betablocker due to h/o mobitz second-degree heart block, s/p pacemaker, not on acei due to elevated Cr , cardiology following for meds optimization  CKD 2-3  Baseline Cr around 1.2  suspect secondary to end-organ damage from shock vs sepsis  Cr peaked at 2.25, though still fluctuating, overall slowing improving  Thrombocytopenia  Possibly related to heparin vs end-organ damage as plts noted to be 51k on day of admit  improving  Flu A with sepsis present on admission, treated  Viridians strep bacteremia  Had been on empiric vanc and rocephin. Now on rocephin alone based on sensitivities, TEE on 3/15 no vegetation   Discussed with ID. Recs for 10 days of tx. Completed course of abx on 3/20 (14 days total)  Remains afebrile, repeat blood culture no growth  Anemia; normocytic, close to baseline, likely anemia of chronic disease, no active gi bleed observed. transfuse 1 prbc on 3/23 due to hgb trending  down  Leukocytosis: better, no fever, repeat culture unrevealing, consider outpatient hematology referral if persists  Dementia  Stable, on aricept, monitor bradycardia  DVT prophylaxis  On xarelto  Code Status: DNR, code status was discussed with family by icu attending Family Communication: Pt in room, sister over the phone on 3/23, daughter at bedside on 3/24, 3/25 and 3/26 Disposition Plan: d/c to snf on 3/28,   Consultants:  Critical Care  Cardiology  IR  Procedures:  Intubation 3/10 and extubation 3/15  Code blue, CPR, defibrillation on 3/10  picc line placement  TEE 3/15  Thoracentesis for pleural effusion on 3/24  Antibiotics: Anti-infectives    Start   Dose/Rate Route Frequency Ordered Stop   11/16/15 1545  vancomycin (VANCOCIN) IVPB 750 mg/150 ml premix Status: Discontinued    750 mg 150 mL/hr over 60 Minutes Intravenous Every 24 hours 11/16/15 1543 11/18/15 1503   11/15/15 0000  vancomycin (VANCOCIN) 1,250 mg in sodium chloride 0.9 % 250 mL IVPB Status: Discontinued    1,250 mg 166.7 mL/hr over 90 Minutes Intravenous Every 24 hours 11/14/15 0653 11/15/15 1527   11/13/15 2345  cefTRIAXone (ROCEPHIN) 2 g in dextrose 5 % 50 mL IVPB Status: Discontinued    2 g 100 mL/hr over 30 Minutes Intravenous Daily at bedtime 11/13/15 2327 11/23/15 1450  11/12/15 0900  cefTRIAXone (ROCEPHIN) 2 g in dextrose 5 % 50 mL IVPB Status: Discontinued    2 g 100 mL/hr over 30 Minutes Intravenous Every 24 hours 11/11/15 1323 11/11/15 1433   11/11/15 2200  oseltamivir (TAMIFLU) 6 MG/ML suspension 30 mg    30 mg Oral 2 times daily 11/11/15 1340 11/14/15 2152   11/11/15 1400  cefTRIAXone (ROCEPHIN) 1 g in dextrose 5 % 50 mL IVPB Status: Discontinued    1 g 100 mL/hr over 30 Minutes Intravenous Once 11/11/15 1324 11/11/15 1433   11/11/15 1200  azithromycin (ZITHROMAX) 500 mg in dextrose  5 % 250 mL IVPB Status: Discontinued    500 mg 250 mL/hr over 60 Minutes Intravenous Every 24 hours 11/10/15 1318 11/11/15 1433   11/11/15 0900  cefTRIAXone (ROCEPHIN) 1 g in dextrose 5 % 50 mL IVPB Status: Discontinued    1 g 100 mL/hr over 30 Minutes Intravenous Every 24 hours 11/10/15 1318 11/11/15 1323   11/11/15 0600  vancomycin (VANCOCIN) IVPB 1000 mg/200 mL premix Status: Discontinued    1,000 mg 200 mL/hr over 60 Minutes Intravenous Every 24 hours 11/11/15 0528 11/14/15 0653   11/10/15 1500  oseltamivir (TAMIFLU) 6 MG/ML suspension 75 mg Status: Discontinued    75 mg Oral 2 times daily 11/10/15 1448 11/11/15 1340   11/10/15 1130  azithromycin (ZITHROMAX) 500 mg in dextrose 5 % 250 mL IVPB    500 mg 250 mL/hr over 60 Minutes Intravenous Once 11/10/15 1127 11/10/15 1319   11/10/15 0830  cefTRIAXone (ROCEPHIN) 1 g in dextrose 5 % 50 mL IVPB    1 g 100 mL/hr over 30 Minutes Intravenous Once 11/10/15 0828 11/10/15 1222          Discharge Exam: BP 107/48 mmHg  Pulse 72  Temp(Src) 98.2 F (36.8 C) (Oral)  Resp 20  Ht 5\' 11"  (1.803 m)  Wt 64.864 kg (143 lb)  BMI 19.95 kg/m2  SpO2 94%   General: Awake, frail, in nad, picc line on right arm  Cardiovascular: paced rhythm  Respiratory: improved areation, no wheezing  Abdomen: soft, nondistended, pos BS  Musculoskeletal: perfused, no clubbing, no cyanosis, no edema  Neuro; baseline dementia, not oriented to time but know he is in the hospital and oriented to person.  Discharge Instructions You were cared for by a hospitalist during your hospital stay. If you have any questions about your discharge medications or the care you received while you were in the hospital after you are discharged, you can call the unit and asked to speak with the hospitalist on call if the hospitalist that took care of you is not available. Once you are discharged, your  primary care physician will handle any further medical issues. Please note that NO REFILLS for any discharge medications will be authorized once you are discharged, as it is imperative that you return to your primary care physician (or establish a relationship with a primary care physician if you do not have one) for your aftercare needs so that they can reassess your need for medications and monitor your lab values.      Discharge Instructions    Diet - low sodium heart healthy    Complete by:  As directed      Increase activity slowly    Complete by:  As directed             Medication List    STOP taking these medications        cyclobenzaprine 5  MG tablet  Commonly known as:  FLEXERIL     gabapentin 100 MG capsule  Commonly known as:  NEURONTIN     isosorbide mononitrate 30 MG 24 hr tablet  Commonly known as:  IMDUR     pantoprazole 40 MG tablet  Commonly known as:  PROTONIX     rOPINIRole 4 MG tablet  Commonly known as:  REQUIP     simvastatin 10 MG tablet  Commonly known as:  ZOCOR      TAKE these medications        albuterol 108 (90 Base) MCG/ACT inhaler  Commonly known as:  PROVENTIL HFA;VENTOLIN HFA  Inhale 2 puffs into the lungs every 6 (six) hours as needed. For wheezing or shortness of breath     amiodarone 200 MG tablet  Commonly known as:  PACERONE  Take 1 tablet (200 mg total) by mouth daily.     arformoterol 15 MCG/2ML Nebu  Commonly known as:  BROVANA  Take 2 mLs (15 mcg total) by nebulization 2 (two) times daily.     atorvastatin 10 MG tablet  Commonly known as:  LIPITOR  Take 1 tablet (10 mg total) by mouth daily.     budesonide 0.25 MG/2ML nebulizer solution  Commonly known as:  PULMICORT  Take 2 mLs (0.25 mg total) by nebulization 2 (two) times daily.     donepezil 5 MG tablet  Commonly known as:  ARICEPT  Take 1 tablet (5 mg total) by mouth at bedtime.     feeding supplement (PRO-STAT SUGAR FREE 64) Liqd  Take 30 mLs by mouth 2  (two) times daily.     FLONASE 50 MCG/ACT nasal spray  Generic drug:  fluticasone  Place 1 spray into both nostrils daily.     furosemide 40 MG tablet  Commonly known as:  LASIX  Take 1 tablet (40 mg total) by mouth daily.     guaiFENesin 600 MG 12 hr tablet  Commonly known as:  MUCINEX  Take 1 tablet (600 mg total) by mouth 2 (two) times daily.     multivitamin tablet  Take 1 tablet by mouth every morning.     polyethylene glycol packet  Commonly known as:  MIRALAX / GLYCOLAX  Take 17 g by mouth daily.     senna-docusate 8.6-50 MG tablet  Commonly known as:  Senokot-S  Take 1 tablet by mouth at bedtime.     tamsulosin 0.4 MG Caps capsule  Commonly known as:  FLOMAX  TAKE ONE CAPSULE BY MOUTH ONCE DAILY     XARELTO 15 MG Tabs tablet  Generic drug:  Rivaroxaban  TAKE ONE TABLET BY MOUTH ONCE DAILY WITH SUPPER       Allergies  Allergen Reactions  . Amoxicillin Itching and Rash  . Penicillins Itching and Rash    Has patient had a PCN reaction causing immediate rash, facial/tongue/throat swelling, SOB or lightheadedness with hypotension: Yes Has patient had a PCN reaction causing severe rash involving mucus membranes or skin necrosis: No Has patient had a PCN reaction that required hospitalization No Has patient had a PCN reaction occurring within the last 10 years: No If all of the above answers are "NO", then may proceed with Cephalosporin use.    Follow-up Information    Follow up with SIMMONS, BRITTAINY, PA-C. Go on 12/08/2015.   Specialties:  Cardiology, Radiology   Why:  @11 :30 am for post hospital    Contact information:   [place]  [place] [address] [phone]        The results of significant diagnostics from this hospitalization (including imaging, microbiology, ancillary and laboratory) are listed below for reference.    Significant Diagnostic Studies: Dg Chest 1 View  11/27/2015  CLINICAL DATA:  Right-sided pleural effusion  status post thoracentesis EXAM: CHEST 1 VIEW COMPARISON:  11/26/2015 FINDINGS: No pneumothorax. Significant decrease in the size right pleural effusion. Trace bilateral pleural effusions remain. PICC line and cardiac pacer unchanged. Moderate cardiac enlargement stable. Stable aortic calcification. IMPRESSION: No pneumothorax status post thoracentesis. Electronically Signed   By: Skipper Cliche M.D.   On: 11/27/2015 15:06   Dg Chest 2 View  11/30/2015  CLINICAL DATA:  Patient with cough for 3 days.  Weakness. EXAM: CHEST  2 VIEW COMPARISON:  Chest radiograph 11/27/2015. FINDINGS: Right upper extremity PICC line is present with tip terminating in the superior vena cava. Left anterior chest wall pacer apparatus, leads stable in position. Stable cardiomegaly status post median sternotomy and CABG procedure. Small layering bilateral pleural effusions. Underlying pulmonary consolidation. No pneumothorax. IMPRESSION: Stable support apparatus. Cardiomegaly. Layering small bilateral pleural effusions with underlying consolidation suggestive of atelectasis. Infection not excluded. Electronically Signed   By: Lovey Newcomer M.D.   On: 11/30/2015 11:11   Dg Chest 2 View  11/26/2015  CLINICAL DATA:  Short of breath.  Week. EXAM: CHEST  2 VIEW COMPARISON:  11/20/2015 FINDINGS: Right PICC stable. Do lead left subclavian pacemaker and leads are stable. Bilateral pleural effusions have increased. Right pleural effusion is larger than the left. Associated dependent atelectasis in lower lobes is associated. Acute left sixth rib fracture is now identified. It is minimally displaced. There is also deformity of the posterior and upper left third, fourth, and fifth ribs. IMPRESSION: Increasing bilateral pleural effusions. New left-sided rib fractures. At least 1 of them, the sixth, has an acute appearance. Electronically Signed   By: Marybelle Killings M.D.   On: 11/26/2015 07:47   Dg Chest 2 View  11/10/2015  CLINICAL DATA:  Weakness  and difficulty walking since 4 p.m. yesterday. Initial encounter. EXAM: CHEST  2 VIEW COMPARISON:  PA and lateral chest 12/24/2012. CT chest, abdomen and pelvis 10/24/2012. FINDINGS: There is cardiomegaly without edema. The chest is hyperexpanded. Minimal left basilar atelectasis is noted. No pneumothorax or pleural effusion is seen. No focal bony abnormality is identified. The patient is status post CABG with a pacing device in place. Remote left rib fractures are identified. IMPRESSION: Cardiomegaly without edema. Mild left basilar atelectasis. Emphysema. Electronically Signed   By: Inge Rise M.D.   On: 11/10/2015 08:40   Ct Chest Wo Contrast  11/26/2015  CLINICAL DATA:  Short of breath. EXAM: CT CHEST WITHOUT CONTRAST TECHNIQUE: Multidetector CT imaging of the chest was performed following the standard protocol without IV contrast. COMPARISON:  Chest x-ray 11/26/2015, chest CT 10/24/2012 FINDINGS: Moderate right pleural effusion with compressive atelectasis in the right lung base. Small left pleural effusion. COPD with mild emphysema. Advanced coronary calcification. Extensive aortic arch calcification. Cardiac enlargement with pacemaker. No pericardial effusion. Patchy density in the lingula similar to the prior study and likely due to scarring. Negative for pneumonia.  Negative for mass or adenopathy. Upper abdomen negative.  No acute skeletal abnormality. IMPRESSION: Bilateral pleural effusions right greater than left, possibly due to congestive heart failure. No edema is present. Cardiac enlargement with advanced coronary calcification Mild lingular scarring. COPD. Electronically Signed   By: Franchot Gallo M.D.   On: 11/26/2015 13:59  Dg Chest Port 1 View  11/20/2015  CLINICAL DATA:  Status post catheter placement. EXAM: PORTABLE CHEST 1 VIEW COMPARISON:  November 17, 2015. FINDINGS: Stable cardiomegaly. Left-sided pacemaker is unchanged in position. Interval placement of right-sided PICC line  with distal tip in expected position of cavoatrial junction. No pneumothorax or pleural effusion is noted. Left lung appears clear. Mild right basilar opacity is noted which is decreased compared to prior exam consistent with edema or pneumonia. IMPRESSION: Interval placement of right-sided PICC line with distal tip in expected position of cavoatrial junction. Decreased right basilar opacity is noted consistent with improving edema or pneumonia. Electronically Signed   By: Marijo Conception, M.D.   On: 11/20/2015 12:15   Dg Chest Port 1 View  11/17/2015  CLINICAL DATA:  82 year old with influenza. Ventilator dependent respiratory failure. Followup asymmetric pulmonary edema versus pneumonia. EXAM: PORTABLE CHEST 1 VIEW COMPARISON:  11/16/2015 and earlier. FINDINGS: Endotracheal tube tip in satisfactory position projecting approximately 3 cm above the carina. Nasogastric tube courses below the diaphragm into the stomach. Cardiac silhouette markedly enlarged. Left subclavian dual lead transvenous pacemaker unchanged. Interval worsening of asymmetric interstitial and airspace opacities in the right lung with new mild interstitial opacities in the left lung. Associated small bilateral pleural effusions. IMPRESSION: 1. Support apparatus satisfactory. 2. New with interstitial edema in the left lung with progressive interstitial airspace opacities throughout the right lung, likely a combination of pulmonary edema and pneumonia. 3. Small bilateral pleural effusions. Electronically Signed   By: Evangeline Dakin M.D.   On: 11/17/2015 07:53   Dg Chest Port 1 View  11/16/2015  CLINICAL DATA:  Pneumonia. EXAM: PORTABLE CHEST 1 VIEW COMPARISON:  11/15/2015 and 11/14/2015 FINDINGS: Endotracheal tube and NG tube are in good position pacemaker in place. Chronic cardiomegaly. The pulmonary vascularity is normal. Improving hazy density at the right lung base could represent infiltrate or small right effusion. Lungs are  hyperinflated consistent with COPD. IMPRESSION: Improving density at the right lung base, either small right effusion or faint right lower lobe infiltrate. Electronically Signed   By: Lorriane Shire M.D.   On: 11/16/2015 07:32   Dg Chest Port 1 View  11/15/2015  CLINICAL DATA:  Respiratory failure EXAM: PORTABLE CHEST 1 VIEW COMPARISON:  11/14/2015 FINDINGS: Patchy opacities in the right upper lobe and right infrahilar regions, suspicious for pneumonia, possibly on the basis of aspiration. Asymmetric pulmonary edema is not excluded. This appearance is mildly improved in the upper lobe when compared to 11/13/2015, but is new from 11/10/2015. Endotracheal tube terminates 2 cm above the carina. Enteric tube courses below the diaphragm. Cardiomegaly. Postsurgical changes related to prior CABG. Left subclavian pacemaker. Old left rib fracture deformities.  Median sternotomy. IMPRESSION: Endotracheal tube terminates 2 cm above the carina. Multifocal patchy right lung opacities, mildly improved from 11/13/2015, favored to reflect aspiration pneumonia. Asymmetric pulmonary edema is not excluded. Electronically Signed   By: Julian Hy M.D.   On: 11/15/2015 07:24   Dg Chest Port 1 View  11/14/2015  CLINICAL DATA:  Cardiac arrest.  Intubation. EXAM: PORTABLE CHEST 1 VIEW COMPARISON:  11/10/2015 FINDINGS: Endotracheal tube 5.7 cm from the carina. Enteric tube tip below the diaphragm not included in field of view. Patient is post median sternotomy. Left-sided pacemaker in place. Cardiomegaly is unchanged. Development of diffuse perihilar and suprahilar right lung opacities, new from prior. Left lung grossly clear. No evidence of pneumothorax. Old left rib fractures. IMPRESSION: 1. Endotracheal tube 5.7 cm from the carina.  Enteric tube in place. 2. Development of right perihilar and suprahilar opacities, may reflect asymmetric pulmonary edema, pneumonia or aspiration. Cardiomegaly is stable. Electronically Signed    By: Jeb Levering M.D.   On: 11/14/2015 00:27   Dg Chest Port 1 View  11/10/2015  CLINICAL DATA:  Shortness breath, cough for 2-3 days EXAM: PORTABLE CHEST 1 VIEW COMPARISON:  11/10/2015 FINDINGS: There is mild left basilar scarring. There is no focal consolidation. There is no pleural effusion or pneumothorax. There is stable cardiomegaly. There is prior CABG. There is a cardiac pacemaker present. The osseous structures are unremarkable. IMPRESSION: No active disease. Electronically Signed   By: Kathreen Devoid   On: 11/10/2015 13:43   Dg Chest Port 1v Same Day  11/14/2015  CLINICAL DATA:  Respiratory failure EXAM: PORTABLE CHEST 1 VIEW COMPARISON:  November 13, 2015 FINDINGS: The ETT terminates at the carina. Recommend withdrawing 3 cm. No pneumothorax. The right-sided pulmonary opacity seen previously has improved but persists. This could represent asymmetric edema versus infectious process. Cardiomegaly again identified. No other acute abnormalities. IMPRESSION: 1. The ETT terminates at the carina.  Recommend withdrawing 3 cm. 2. The right-sided pulmonary opacity has improved but persist. Recommend follow-up to resolution. These results will be called to the ordering clinician or representative by the Radiologist Assistant, and communication documented in the PACS or zVision Dashboard. Electronically Signed   By: Dorise Bullion III M.D   On: 11/14/2015 11:12   Dg Abd Portable 1v  11/14/2015  CLINICAL DATA:  Orogastric tube placement. EXAM: PORTABLE ABDOMEN - 1 VIEW COMPARISON:  10/24/2012 FINDINGS: Tip and side port of the enteric tube below the diaphragm in the stomach, however looped and tip directed towards the fundus. Air-filled transverse colon. No definite small bowel dilatation. IMPRESSION: Enteric tube below the stomach, however tip directed towards the fundus. Nonobstructive bowel gas pattern. Electronically Signed   By: Jeb Levering M.D.   On: 11/14/2015 00:28   US Thoracentesis Asp Pleural  Space W/img Guide  11/27/2015  INDICATION: Shortness of breath. Pleural effusion noted on recent CT scan. Request diagnostic and therapeutic thoracentesis. EXAM: ULTRASOUND GUIDED RIGHT THORACENTESIS MEDICATIONS: None. COMPLICATIONS: None immediate. PROCEDURE: An ultrasound guided thoracentesis was thoroughly discussed with the patient and questions answered. The benefits, risks, alternatives and complications were also discussed. The patient understands and wishes to proceed with the procedure. Written consent was obtained. Ultrasound was performed to localize and mark an adequate pocket of fluid in the right chest. The area was then prepped and draped in the normal sterile fashion. 1% Lidocaine was used for local anesthesia. Under ultrasound guidance a Safe-T-Centesis catheter was introduced. Thoracentesis was performed. The catheter was removed and a dressing applied. FINDINGS: A total of approximately 1.6 L of hazy, blood-tinged fluid was removed. Samples were sent to the laboratory as requested by the clinical team. IMPRESSION: Successful ultrasound guided right thoracentesis yielding 1.6 L of pleural fluid. Read by: Ascencion Dike PA-C Electronically Signed   By: Sandi Mariscal M.D.   On: 11/27/2015 15:03    Microbiology: Recent Results (from the past 240 hour(s))  Culture, body fluid-bottle     Status: None (Preliminary result)   Collection Time: 11/27/15  3:40 PM  Result Value Ref Range Status   Specimen Description FLUID RIGHT PLEURAL  Final   Special Requests BOTTLES DRAWN AEROBIC AND ANAEROBIC 10CC  Final   Culture NO GROWTH 4 DAYS  Final   Report Status PENDING  Incomplete  Gram stain  Status: None   Collection Time: 11/27/15  3:40 PM  Result Value Ref Range Status   Specimen Description FLUID RIGHT PLEURAL  Final   Special Requests NONE  Final   Gram Stain   Final    MODERATE WBC PRESENT,BOTH PMN AND MONONUCLEAR NO ORGANISMS SEEN    Report Status 11/27/2015 FINAL  Final      Labs: Basic Metabolic Panel:  Recent Labs Lab 11/27/15 0430 11/28/15 0425 11/29/15 0439 11/30/15 0419 12/01/15 0257  NA 145 145 143 143 143  K 4.1 4.0 4.1 3.9 4.3  CL 107 104 102 101 101  CO2 33* 34* 36* 33* 34*  GLUCOSE 106* 107* 115* 140* 129*  BUN 30* 27* 23* 26* 29*  CREATININE 1.48* 1.83* 1.67* 1.90* 2.09*  CALCIUM 8.8* 8.8* 8.9 8.9 8.8*   Liver Function Tests: No results for input(s): AST, ALT, ALKPHOS, BILITOT, PROT, ALBUMIN in the last 168 hours. No results for input(s): LIPASE, AMYLASE in the last 168 hours. No results for input(s): AMMONIA in the last 168 hours. CBC:  Recent Labs Lab 11/27/15 0430 11/28/15 0425 11/29/15 0439 11/30/15 0419 12/01/15 0257  WBC 15.3* 19.1* 15.6* 18.6* 19.6*  NEUTROABS 3.4  --  3.1  --   --   HGB 8.3* 9.1* 8.4* 9.5* 9.6*  HCT 28.2* 29.6* 27.3* 32.4* 32.1*  MCV 91.0 91.1 91.3 91.3 93.0  PLT 114* 116* 113* 127* 125*   Cardiac Enzymes:  Recent Labs Lab 11/26/15 1250 11/26/15 1827 11/26/15 2315  TROPONINI 0.05* 0.06* 0.06*   BNP: BNP (last 3 results)  Recent Labs  11/10/15 1456 11/16/15 0353  BNP 1792.8* 406.2*    ProBNP (last 3 results) No results for input(s): PROBNP in the last 8760 hours.  CBG:  Recent Labs Lab 11/30/15 0643 11/30/15 1142 11/30/15 1633 11/30/15 2109 12/01/15 0655  GLUCAP 97 136* 159* 99 123*       Signed:  Jamus Loving MD, PhD  [facility] 12/01/2015, 12:01 PM

## 2015-12-02 ENCOUNTER — Emergency Department (HOSPITAL_COMMUNITY): Payer: Medicare Other

## 2015-12-02 ENCOUNTER — Inpatient Hospital Stay (HOSPITAL_COMMUNITY)
Admission: EM | Admit: 2015-12-02 | Discharge: 2015-12-11 | DRG: 871 | Disposition: A | Discharge status: Hospice | Payer: Medicare Other | Attending: Internal Medicine | Admitting: Internal Medicine

## 2015-12-02 DIAGNOSIS — E43 Unspecified severe protein-calorie malnutrition: Secondary | ICD-10-CM | POA: Diagnosis present

## 2015-12-02 DIAGNOSIS — N183 Chronic kidney disease, stage 3 unspecified: Secondary | ICD-10-CM | POA: Diagnosis present

## 2015-12-02 DIAGNOSIS — R569 Unspecified convulsions: Secondary | ICD-10-CM | POA: Diagnosis not present

## 2015-12-02 DIAGNOSIS — L89320 Pressure ulcer of left buttock, unstageable: Secondary | ICD-10-CM | POA: Diagnosis present

## 2015-12-02 DIAGNOSIS — L120 Bullous pemphigoid: Secondary | ICD-10-CM | POA: Diagnosis not present

## 2015-12-02 DIAGNOSIS — E872 Acidosis: Secondary | ICD-10-CM | POA: Diagnosis present

## 2015-12-02 DIAGNOSIS — A419 Sepsis, unspecified organism: Secondary | ICD-10-CM | POA: Diagnosis present

## 2015-12-02 DIAGNOSIS — I482 Chronic atrial fibrillation: Secondary | ICD-10-CM | POA: Diagnosis present

## 2015-12-02 DIAGNOSIS — R1310 Dysphagia, unspecified: Secondary | ICD-10-CM | POA: Diagnosis present

## 2015-12-02 DIAGNOSIS — L8990 Pressure ulcer of unspecified site, unspecified stage: Secondary | ICD-10-CM | POA: Diagnosis present

## 2015-12-02 DIAGNOSIS — R21 Rash and other nonspecific skin eruption: Secondary | ICD-10-CM | POA: Diagnosis present

## 2015-12-02 DIAGNOSIS — Z823 Family history of stroke: Secondary | ICD-10-CM

## 2015-12-02 DIAGNOSIS — G9340 Encephalopathy, unspecified: Secondary | ICD-10-CM

## 2015-12-02 DIAGNOSIS — I5022 Chronic systolic (congestive) heart failure: Secondary | ICD-10-CM | POA: Diagnosis present

## 2015-12-02 DIAGNOSIS — J189 Pneumonia, unspecified organism: Secondary | ICD-10-CM

## 2015-12-02 DIAGNOSIS — E785 Hyperlipidemia, unspecified: Secondary | ICD-10-CM | POA: Diagnosis present

## 2015-12-02 DIAGNOSIS — J690 Pneumonitis due to inhalation of food and vomit: Secondary | ICD-10-CM | POA: Diagnosis present

## 2015-12-02 DIAGNOSIS — Z881 Allergy status to other antibiotic agents status: Secondary | ICD-10-CM

## 2015-12-02 DIAGNOSIS — R739 Hyperglycemia, unspecified: Secondary | ICD-10-CM | POA: Diagnosis present

## 2015-12-02 DIAGNOSIS — L511 Stevens-Johnson syndrome: Secondary | ICD-10-CM | POA: Diagnosis not present

## 2015-12-02 DIAGNOSIS — G2581 Restless legs syndrome: Secondary | ICD-10-CM | POA: Diagnosis present

## 2015-12-02 DIAGNOSIS — I2510 Atherosclerotic heart disease of native coronary artery without angina pectoris: Secondary | ICD-10-CM | POA: Diagnosis present

## 2015-12-02 DIAGNOSIS — I25810 Atherosclerosis of coronary artery bypass graft(s) without angina pectoris: Secondary | ICD-10-CM | POA: Diagnosis present

## 2015-12-02 DIAGNOSIS — I4901 Ventricular fibrillation: Secondary | ICD-10-CM

## 2015-12-02 DIAGNOSIS — Z79899 Other long term (current) drug therapy: Secondary | ICD-10-CM

## 2015-12-02 DIAGNOSIS — Z950 Presence of cardiac pacemaker: Secondary | ICD-10-CM

## 2015-12-02 DIAGNOSIS — D696 Thrombocytopenia, unspecified: Secondary | ICD-10-CM | POA: Diagnosis present

## 2015-12-02 DIAGNOSIS — I248 Other forms of acute ischemic heart disease: Secondary | ICD-10-CM | POA: Diagnosis present

## 2015-12-02 DIAGNOSIS — Z7189 Other specified counseling: Secondary | ICD-10-CM | POA: Diagnosis present

## 2015-12-02 DIAGNOSIS — K219 Gastro-esophageal reflux disease without esophagitis: Secondary | ICD-10-CM | POA: Diagnosis present

## 2015-12-02 DIAGNOSIS — R0602 Shortness of breath: Secondary | ICD-10-CM

## 2015-12-02 DIAGNOSIS — Z66 Do not resuscitate: Secondary | ICD-10-CM | POA: Diagnosis present

## 2015-12-02 DIAGNOSIS — Z515 Encounter for palliative care: Secondary | ICD-10-CM | POA: Diagnosis not present

## 2015-12-02 DIAGNOSIS — L89310 Pressure ulcer of right buttock, unstageable: Secondary | ICD-10-CM | POA: Diagnosis present

## 2015-12-02 DIAGNOSIS — I447 Left bundle-branch block, unspecified: Secondary | ICD-10-CM | POA: Diagnosis present

## 2015-12-02 DIAGNOSIS — E039 Hypothyroidism, unspecified: Secondary | ICD-10-CM | POA: Diagnosis present

## 2015-12-02 DIAGNOSIS — G9341 Metabolic encephalopathy: Secondary | ICD-10-CM | POA: Diagnosis present

## 2015-12-02 DIAGNOSIS — T380X5A Adverse effect of glucocorticoids and synthetic analogues, initial encounter: Secondary | ICD-10-CM | POA: Diagnosis present

## 2015-12-02 DIAGNOSIS — R4701 Aphasia: Secondary | ICD-10-CM | POA: Diagnosis present

## 2015-12-02 DIAGNOSIS — I10 Essential (primary) hypertension: Secondary | ICD-10-CM | POA: Diagnosis present

## 2015-12-02 DIAGNOSIS — Z8674 Personal history of sudden cardiac arrest: Secondary | ICD-10-CM

## 2015-12-02 DIAGNOSIS — Z87891 Personal history of nicotine dependence: Secondary | ICD-10-CM

## 2015-12-02 DIAGNOSIS — Z7951 Long term (current) use of inhaled steroids: Secondary | ICD-10-CM

## 2015-12-02 DIAGNOSIS — Z880 Allergy status to penicillin: Secondary | ICD-10-CM

## 2015-12-02 DIAGNOSIS — Z6820 Body mass index (BMI) 20.0-20.9, adult: Secondary | ICD-10-CM

## 2015-12-02 DIAGNOSIS — E870 Hyperosmolality and hypernatremia: Secondary | ICD-10-CM | POA: Diagnosis not present

## 2015-12-02 DIAGNOSIS — Y95 Nosocomial condition: Secondary | ICD-10-CM | POA: Diagnosis present

## 2015-12-02 DIAGNOSIS — I469 Cardiac arrest, cause unspecified: Secondary | ICD-10-CM | POA: Diagnosis present

## 2015-12-02 DIAGNOSIS — F0390 Unspecified dementia without behavioral disturbance: Secondary | ICD-10-CM | POA: Diagnosis present

## 2015-12-02 DIAGNOSIS — I130 Hypertensive heart and chronic kidney disease with heart failure and stage 1 through stage 4 chronic kidney disease, or unspecified chronic kidney disease: Secondary | ICD-10-CM | POA: Diagnosis present

## 2015-12-02 DIAGNOSIS — Z7901 Long term (current) use of anticoagulants: Secondary | ICD-10-CM

## 2015-12-02 DIAGNOSIS — G8384 Todd's paralysis (postepileptic): Secondary | ICD-10-CM | POA: Diagnosis present

## 2015-12-02 DIAGNOSIS — G9389 Other specified disorders of brain: Secondary | ICD-10-CM | POA: Diagnosis present

## 2015-12-02 DIAGNOSIS — J441 Chronic obstructive pulmonary disease with (acute) exacerbation: Secondary | ICD-10-CM | POA: Diagnosis present

## 2015-12-02 DIAGNOSIS — J9601 Acute respiratory failure with hypoxia: Secondary | ICD-10-CM | POA: Diagnosis present

## 2015-12-02 DIAGNOSIS — I441 Atrioventricular block, second degree: Secondary | ICD-10-CM | POA: Diagnosis present

## 2015-12-02 DIAGNOSIS — I4821 Permanent atrial fibrillation: Secondary | ICD-10-CM | POA: Diagnosis present

## 2015-12-02 DIAGNOSIS — G4089 Other seizures: Secondary | ICD-10-CM | POA: Diagnosis present

## 2015-12-02 LAB — I-STAT CHEM 8, ED
BUN: 53 mg/dL — ABNORMAL HIGH (ref 6–20)
CREATININE: 2.3 mg/dL — AB (ref 0.61–1.24)
Calcium, Ion: 1.05 mmol/L — ABNORMAL LOW (ref 1.13–1.30)
Chloride: 102 mmol/L (ref 101–111)
Glucose, Bld: 150 mg/dL — ABNORMAL HIGH (ref 65–99)
HEMATOCRIT: 40 % (ref 39.0–52.0)
HEMOGLOBIN: 13.6 g/dL (ref 13.0–17.0)
POTASSIUM: 3.8 mmol/L (ref 3.5–5.1)
SODIUM: 144 mmol/L (ref 135–145)
TCO2: 29 mmol/L (ref 0–100)

## 2015-12-02 LAB — CULTURE, BODY FLUID-BOTTLE: CULTURE: NO GROWTH

## 2015-12-02 LAB — CULTURE, BODY FLUID W GRAM STAIN -BOTTLE

## 2015-12-02 NOTE — ED Provider Notes (Signed)
By signing my name below, I, Evelene Croon, attest that this documentation has been prepared under the direction and in the presence of [place], DO . Electronically Signed: Evelene Croon, Scribe. 12/02/2015. 11:49 PM.   TIME SEEN: 11:35 PM  CHIEF COMPLAINT: No chief complaint on file.  LEVEL 5 CAVEAT DUE TO ACUITY OF MEDICAL CONDITION  HPI:   [name] [name] is a 82 y.o. male with history of CAD, COPD, CHF, atrial fibrillation on Xarelto who was brought in by ambulance from a nursing home and [facility], who presents to the Emergency Department for stroke symptoms and seizure like activity this evening. Per EMS, pt was last know well ~ 20:17; 4 minutes later pt was found seizing. EMS reports left sided weakness, aphasia. Twelve-lead and Route showed left bundle branch block and FSBS of 142. Pt had another seizure-like episode en route and was given 2 mg of Versed. At that time he has a right gaze deficit.  Pt was also placed on 4L O2 and has oxygen saturations of 96%. EMS was not able to obtain much PMHX.   EMS notes at baseline pt is normally talking and somewhat active. Unable to fully obtain HPI/ROS due to acuity of medical condition.   Per pt's medical record, he was admitted on 11/10/15 secondary to influenza, PNA.  He had respiratory failure was intubated on March 10 and exudate on March 15. He also had a V. fib arrest. Thoracentesis of a pleural effusion on March 24. Discharged to nursing home on March 28.    ROS: LEVEL 5 CAVEAT DUE TO ACUITY OF CONDITION  PAST MEDICAL HISTORY/PAST SURGICAL HISTORY:  Past Medical History  Diagnosis Date  . CAD (coronary artery disease)   . Hypertension   . Vitamin D deficiency   . Chronic back pain   . Anemia   . ED (erectile dysfunction)   . Arthritis   . RLS (restless legs syndrome)   . GERD (gastroesophageal reflux disease)   . COPD (chronic obstructive pulmonary disease) ([place])   . Hyperlipidemia   . Atrial fibrillation ([place])   .  Second degree Mobitz II AV block     s/p PPM  . Asthma 09/15/2011  . Long term current use of anticoagulant 09/15/2011  . Thrombocytopenia ([place]) 09/15/2011  . Lymphocytosis   . CHF (congestive heart failure) ([place]) 01/01/2013  . Allergy     MEDICATIONS:  Prior to Admission medications   Medication Sig Start Date End Date Taking? Authorizing Provider  albuterol (PROVENTIL HFA;VENTOLIN HFA) 108 (90 BASE) MCG/ACT inhaler Inhale 2 puffs into the lungs every 6 (six) hours as needed. For wheezing or shortness of breath 07/07/14   Susy Frizzle, MD  Amino Acids-Protein Hydrolys (FEEDING SUPPLEMENT, PRO-STAT SUGAR FREE 64,) LIQD Take 30 mLs by mouth 2 (two) times daily. 11/29/15   Florencia Reasons, MD  amiodarone (PACERONE) 200 MG tablet Take 1 tablet (200 mg total) by mouth daily. 11/29/15   Florencia Reasons, MD  arformoterol (BROVANA) 15 MCG/2ML NEBU Take 2 mLs (15 mcg total) by nebulization 2 (two) times daily. 11/29/15   Florencia Reasons, MD  atorvastatin (LIPITOR) 10 MG tablet Take 1 tablet (10 mg total) by mouth daily. 11/29/15   Florencia Reasons, MD  budesonide (PULMICORT) 0.25 MG/2ML nebulizer solution Take 2 mLs (0.25 mg total) by nebulization 2 (two) times daily. 11/29/15   Florencia Reasons, MD  donepezil (ARICEPT) 5 MG tablet Take 1 tablet (5 mg total) by mouth at bedtime. 01/09/15  Susy Frizzle, MD  FLONASE 50 MCG/ACT nasal spray Place 1 spray into both nostrils daily. 01/12/15 01/12/16  Earleen Newport, MD  furosemide (LASIX) 40 MG tablet Take 1 tablet (40 mg total) by mouth daily. 11/18/14   Deboraha Sprang, MD  guaiFENesin (MUCINEX) 600 MG 12 hr tablet Take 1 tablet (600 mg total) by mouth 2 (two) times daily. 11/29/15   Florencia Reasons, MD  Multiple Vitamin (MULTIVITAMIN) tablet Take 1 tablet by mouth every morning.     Historical Provider, MD  polyethylene glycol (MIRALAX / GLYCOLAX) packet Take 17 g by mouth daily. 11/29/15   Florencia Reasons, MD  senna-docusate (SENOKOT-S) 8.6-50 MG tablet Take 1 tablet by mouth at bedtime. 11/29/15   Florencia Reasons, MD   tamsulosin (FLOMAX) 0.4 MG CAPS capsule TAKE ONE CAPSULE BY MOUTH ONCE DAILY 04/27/15   Susy Frizzle, MD  XARELTO 15 MG TABS tablet TAKE ONE TABLET BY MOUTH ONCE DAILY WITH SUPPER 08/05/15   Deboraha Sprang, MD    ALLERGIES:  Allergies  Allergen Reactions  . Amoxicillin Itching and Rash  . Penicillins Itching and Rash    Has patient had a PCN reaction causing immediate rash, facial/tongue/throat swelling, SOB or lightheadedness with hypotension: Yes Has patient had a PCN reaction causing severe rash involving mucus membranes or skin necrosis: No Has patient had a PCN reaction that required hospitalization No Has patient had a PCN reaction occurring within the last 10 years: No If all of the above answers are "NO", then may proceed with Cephalosporin use.     SOCIAL HISTORY:  Social History  Substance Use Topics  . Smoking status: Former Smoker -- 3.00 packs/day for 80 years    Types: Cigarettes    Quit date: 09/05/2008  . Smokeless tobacco: Never Used  . Alcohol Use: No     Comment: occasional 4 drinks per week    FAMILY HISTORY: Family History  Problem Relation Age of Onset  . Diabetes Sister   . Cancer Mother     lung cancer  . Stroke Father   . Cancer Brother     brain cancer  . Alcohol abuse Brother     EXAM: BP 119/57 mmHg  Pulse 82  Temp(Src) 97.8 F (36.6 C) (Oral)  Resp 37  Wt 158 lb 11.7 oz (72 kg)  SpO2 91% CONSTITUTIONAL: Patient is aphasic, unable to answer questions or follow commands, elderly, appears malnourished HEAD: Normocephalic EYES: Conjunctivae clear, PERRL ENT: normal nose; no rhinorrhea; moist mucous membranes NECK: Supple, no meningismus, no LAD  CARD: RRR; S1 and S2 appreciated; no murmurs, no clicks, no rubs, no gallops RESP: Normal chest excursion without splinting; Patient is on 2 L nasal cannula, here is tachypneic with rhonchi breath sounds diffusely, wet cough, diminished at his bases bilaterally  CHEST:  Area of ecchymosis  noted to the posterior and lateral left chest wall; no flail chest  ABD/GI: Normal bowel sounds; non-distended; soft, non-tender, no rebound, no guarding, no peritoneal signs BACK:  The back appears normal and is non-tender to palpation, there is no CVA tenderness EXT:  non-tender to palpation; no edema; normal capillary refill; no cyanosis, no calf tenderness or swelling    SKIN: Normal color for age and race; warm; no rash on the skin tear to the left elbow NEURO: Appears to have weakness of the right side, some spontaneous movement of the right side, aphasic, not following commands   MEDICAL DECISION MAKING: Pt here as Code stroke.  LSN 20:17.  Patient had 2 seizures, one a nursing facility and one with EMS. Dr. Leonel Ramsay with neurology has seen patient feels this is more likely a seizure. We'll give IV Depakote. Labs, urine, head CT, chest x-ray pending. Patient is not a TPA candidate as neurology does not think this was a stroke.  Pt also not candidate for directed therapy due to his poor baseline status.  ED PROGRESS: 12:05 AM  Pt is now sitting upright and more responsive. We'll continue to closely monitor. He is however tachypneic with rhonchorous breath sounds and feels warm to touch. Will obtain rectal temp, chest x-ray, BNP, ABG, blood cultures, repeat flu swab. Has a history of CHF, COPD and recent respiratory failure from pneumonia and influenza.   1:20 AM  Pt is more responsive but is now on a nonrebreather because he is breathing through his mouth. Still tachypneic but otherwise he would in a stable. He does appear to have a developing pneumonia and a rectal temperature 102.4. We'll treat for healthcare associated pneumonia. We're attempting to get in touch with his family. During his last admission he was DO NOT RESUSCITATE. Will consult hospitalist for admission.  1:35 AM  D/w pt's sister Inez Catalina on the phone. She states she will come to the hospital in the morning.  She confirmed  that patient is a DO NOT RESUSCITATE, DO NOT INTUBATE. She would want admission, IV fluids and antibiotics. She would not one a central line and vasopressors. Because of this I feel he can be admitted to step down.  1:45 AM  Discussed patient's case with hospitalist, Dr. Hal Hope.  Recommend admission to step down, inpatient bed.  I will place holding orders per their request. Patient and family (if present) updated with plan. Care transferred to hospitalist service.   EKG Interpretation  Date/Time:  Wednesday December 02 2015 23:58:25 EDT Ventricular Rate:  84 PR Interval:  184 QRS Duration: 179 QT Interval:  536 QTC Calculation: 634 R Axis:   -88 Text Interpretation:  Sinus rhythm Right bundle branch block No significant change since last tracing Confirmed by [place],  DO, Chikita Dogan [id number]) on 12/03/2015 12:07:11 AM      CRITICAL CARE Performed by: Nyra Jabs   Total critical care time: 45 minutes  Critical care time was exclusive of separately billable procedures and treating other patients.  Critical care was necessary to treat or prevent imminent or life-threatening deterioration.  Critical care was time spent personally by me on the following activities: development of treatment plan with patient and/or surrogate as well as nursing, discussions with consultants, evaluation of patient's response to treatment, examination of patient, obtaining history from patient or surrogate, ordering and performing treatments and interventions, ordering and review of laboratory studies, ordering and review of radiographic studies, pulse oximetry and re-evaluation of patient's condition.   I personally performed the services described in this documentation, which was scribed in my presence. The recorded information has been reviewed and is accurate.    [place], DO 12/03/15 [phone]

## 2015-12-03 ENCOUNTER — Inpatient Hospital Stay (HOSPITAL_COMMUNITY): Payer: Medicare Other

## 2015-12-03 ENCOUNTER — Encounter (HOSPITAL_COMMUNITY): Payer: Self-pay | Admitting: Emergency Medicine

## 2015-12-03 DIAGNOSIS — E870 Hyperosmolality and hypernatremia: Secondary | ICD-10-CM | POA: Diagnosis not present

## 2015-12-03 DIAGNOSIS — N183 Chronic kidney disease, stage 3 (moderate): Secondary | ICD-10-CM | POA: Diagnosis present

## 2015-12-03 DIAGNOSIS — R569 Unspecified convulsions: Secondary | ICD-10-CM | POA: Diagnosis present

## 2015-12-03 DIAGNOSIS — I248 Other forms of acute ischemic heart disease: Secondary | ICD-10-CM | POA: Diagnosis present

## 2015-12-03 DIAGNOSIS — G9340 Encephalopathy, unspecified: Secondary | ICD-10-CM

## 2015-12-03 DIAGNOSIS — E43 Unspecified severe protein-calorie malnutrition: Secondary | ICD-10-CM | POA: Diagnosis present

## 2015-12-03 DIAGNOSIS — A419 Sepsis, unspecified organism: Secondary | ICD-10-CM | POA: Diagnosis present

## 2015-12-03 DIAGNOSIS — I5022 Chronic systolic (congestive) heart failure: Secondary | ICD-10-CM | POA: Diagnosis present

## 2015-12-03 DIAGNOSIS — R739 Hyperglycemia, unspecified: Secondary | ICD-10-CM | POA: Diagnosis present

## 2015-12-03 DIAGNOSIS — I25810 Atherosclerosis of coronary artery bypass graft(s) without angina pectoris: Secondary | ICD-10-CM | POA: Diagnosis not present

## 2015-12-03 DIAGNOSIS — J9601 Acute respiratory failure with hypoxia: Secondary | ICD-10-CM | POA: Diagnosis present

## 2015-12-03 DIAGNOSIS — Z7189 Other specified counseling: Secondary | ICD-10-CM | POA: Diagnosis not present

## 2015-12-03 DIAGNOSIS — R21 Rash and other nonspecific skin eruption: Secondary | ICD-10-CM | POA: Diagnosis not present

## 2015-12-03 DIAGNOSIS — I482 Chronic atrial fibrillation: Secondary | ICD-10-CM | POA: Diagnosis not present

## 2015-12-03 DIAGNOSIS — L89310 Pressure ulcer of right buttock, unstageable: Secondary | ICD-10-CM | POA: Diagnosis present

## 2015-12-03 DIAGNOSIS — I130 Hypertensive heart and chronic kidney disease with heart failure and stage 1 through stage 4 chronic kidney disease, or unspecified chronic kidney disease: Secondary | ICD-10-CM | POA: Diagnosis present

## 2015-12-03 DIAGNOSIS — J441 Chronic obstructive pulmonary disease with (acute) exacerbation: Secondary | ICD-10-CM | POA: Diagnosis present

## 2015-12-03 DIAGNOSIS — E039 Hypothyroidism, unspecified: Secondary | ICD-10-CM | POA: Diagnosis not present

## 2015-12-03 DIAGNOSIS — I10 Essential (primary) hypertension: Secondary | ICD-10-CM | POA: Diagnosis not present

## 2015-12-03 DIAGNOSIS — R0602 Shortness of breath: Secondary | ICD-10-CM | POA: Diagnosis not present

## 2015-12-03 DIAGNOSIS — Z515 Encounter for palliative care: Secondary | ICD-10-CM | POA: Diagnosis not present

## 2015-12-03 DIAGNOSIS — E872 Acidosis: Secondary | ICD-10-CM | POA: Diagnosis present

## 2015-12-03 DIAGNOSIS — Y95 Nosocomial condition: Secondary | ICD-10-CM | POA: Diagnosis present

## 2015-12-03 DIAGNOSIS — I441 Atrioventricular block, second degree: Secondary | ICD-10-CM | POA: Diagnosis not present

## 2015-12-03 DIAGNOSIS — J690 Pneumonitis due to inhalation of food and vomit: Secondary | ICD-10-CM | POA: Diagnosis present

## 2015-12-03 DIAGNOSIS — J189 Pneumonia, unspecified organism: Secondary | ICD-10-CM | POA: Diagnosis not present

## 2015-12-03 DIAGNOSIS — G4089 Other seizures: Secondary | ICD-10-CM | POA: Diagnosis present

## 2015-12-03 DIAGNOSIS — R1310 Dysphagia, unspecified: Secondary | ICD-10-CM | POA: Diagnosis present

## 2015-12-03 DIAGNOSIS — G9389 Other specified disorders of brain: Secondary | ICD-10-CM | POA: Diagnosis present

## 2015-12-03 DIAGNOSIS — L120 Bullous pemphigoid: Secondary | ICD-10-CM | POA: Diagnosis not present

## 2015-12-03 DIAGNOSIS — R4701 Aphasia: Secondary | ICD-10-CM | POA: Diagnosis present

## 2015-12-03 DIAGNOSIS — L89320 Pressure ulcer of left buttock, unstageable: Secondary | ICD-10-CM | POA: Diagnosis present

## 2015-12-03 DIAGNOSIS — R079 Chest pain, unspecified: Secondary | ICD-10-CM | POA: Diagnosis not present

## 2015-12-03 DIAGNOSIS — Z66 Do not resuscitate: Secondary | ICD-10-CM | POA: Diagnosis present

## 2015-12-03 DIAGNOSIS — G9341 Metabolic encephalopathy: Secondary | ICD-10-CM | POA: Diagnosis present

## 2015-12-03 DIAGNOSIS — F0390 Unspecified dementia without behavioral disturbance: Secondary | ICD-10-CM | POA: Diagnosis present

## 2015-12-03 DIAGNOSIS — L511 Stevens-Johnson syndrome: Secondary | ICD-10-CM | POA: Diagnosis not present

## 2015-12-03 LAB — COMPREHENSIVE METABOLIC PANEL
ALBUMIN: 2 g/dL — AB (ref 3.5–5.0)
ALK PHOS: 100 U/L (ref 38–126)
ALT: 13 U/L — AB (ref 17–63)
ALT: 16 U/L — ABNORMAL LOW (ref 17–63)
ANION GAP: 15 (ref 5–15)
AST: 21 U/L (ref 15–41)
AST: 30 U/L (ref 15–41)
Albumin: 2.4 g/dL — ABNORMAL LOW (ref 3.5–5.0)
Alkaline Phosphatase: 122 U/L (ref 38–126)
Anion gap: 10 (ref 5–15)
BILIRUBIN TOTAL: 1 mg/dL (ref 0.3–1.2)
BUN: 46 mg/dL — ABNORMAL HIGH (ref 6–20)
BUN: 49 mg/dL — AB (ref 6–20)
CALCIUM: 8.2 mg/dL — AB (ref 8.9–10.3)
CO2: 28 mmol/L (ref 22–32)
CO2: 28 mmol/L (ref 22–32)
CREATININE: 2.36 mg/dL — AB (ref 0.61–1.24)
Calcium: 9.2 mg/dL (ref 8.9–10.3)
Chloride: 102 mmol/L (ref 101–111)
Chloride: 107 mmol/L (ref 101–111)
Creatinine, Ser: 2.56 mg/dL — ABNORMAL HIGH (ref 0.61–1.24)
GFR calc Af Amer: 24 mL/min — ABNORMAL LOW (ref >60)
GFR calc Af Amer: 27 mL/min — ABNORMAL LOW (ref >60)
GFR calc non Af Amer: 23 mL/min — ABNORMAL LOW (ref >60)
GFR, EST NON AFRICAN AMERICAN: 21 mL/min — AB (ref >60)
GLUCOSE: 177 mg/dL — AB (ref 65–99)
Glucose, Bld: 156 mg/dL — ABNORMAL HIGH (ref 65–99)
POTASSIUM: 3.8 mmol/L (ref 3.5–5.1)
Potassium: 3.8 mmol/L (ref 3.5–5.1)
SODIUM: 145 mmol/L (ref 135–145)
Sodium: 145 mmol/L (ref 135–145)
TOTAL PROTEIN: 5.8 g/dL — AB (ref 6.5–8.1)
Total Bilirubin: 0.8 mg/dL (ref 0.3–1.2)
Total Protein: 5.2 g/dL — ABNORMAL LOW (ref 6.5–8.1)

## 2015-12-03 LAB — HEPARIN LEVEL (UNFRACTIONATED): HEPARIN UNFRACTIONATED: 1.38 [IU]/mL — AB (ref 0.30–0.70)

## 2015-12-03 LAB — DIFFERENTIAL
BASOS ABS: 0 10*3/uL (ref 0.0–0.1)
Basophils Relative: 0 %
Eosinophils Absolute: 0 10*3/uL (ref 0.0–0.7)
Eosinophils Relative: 0 %
LYMPHS ABS: 11 10*3/uL — AB (ref 0.7–4.0)
Lymphocytes Relative: 50 %
MONOS PCT: 2 %
Monocytes Absolute: 0.4 10*3/uL (ref 0.1–1.0)
NEUTROS PCT: 48 %
Neutro Abs: 10.5 10*3/uL — ABNORMAL HIGH (ref 1.7–7.7)

## 2015-12-03 LAB — CBC WITH DIFFERENTIAL/PLATELET
BASOS ABS: 0 10*3/uL (ref 0.0–0.1)
Basophils Relative: 0 %
Eosinophils Absolute: 0 10*3/uL (ref 0.0–0.7)
Eosinophils Relative: 0 %
HCT: 28.7 % — ABNORMAL LOW (ref 39.0–52.0)
HEMOGLOBIN: 8.9 g/dL — AB (ref 13.0–17.0)
LYMPHS PCT: 55 %
Lymphs Abs: 11 10*3/uL — ABNORMAL HIGH (ref 0.7–4.0)
MCH: 28.7 pg (ref 26.0–34.0)
MCHC: 31 g/dL (ref 30.0–36.0)
MCV: 92.6 fL (ref 78.0–100.0)
MONOS PCT: 4 %
Monocytes Absolute: 0.8 10*3/uL (ref 0.1–1.0)
Neutro Abs: 8.2 10*3/uL — ABNORMAL HIGH (ref 1.7–7.7)
Neutrophils Relative %: 41 %
Platelets: 108 10*3/uL — ABNORMAL LOW (ref 150–400)
RBC: 3.1 MIL/uL — AB (ref 4.22–5.81)
RDW: 19.3 % — ABNORMAL HIGH (ref 11.5–15.5)
WBC: 20 10*3/uL — AB (ref 4.0–10.5)

## 2015-12-03 LAB — BRAIN NATRIURETIC PEPTIDE: B Natriuretic Peptide: 488.1 pg/mL — ABNORMAL HIGH (ref 0.0–100.0)

## 2015-12-03 LAB — URINALYSIS, ROUTINE W REFLEX MICROSCOPIC
BILIRUBIN URINE: NEGATIVE
GLUCOSE, UA: NEGATIVE mg/dL
Hgb urine dipstick: NEGATIVE
Ketones, ur: NEGATIVE mg/dL
Leukocytes, UA: NEGATIVE
NITRITE: NEGATIVE
Protein, ur: 30 mg/dL — AB
SPECIFIC GRAVITY, URINE: 1.021 (ref 1.005–1.030)
pH: 5 (ref 5.0–8.0)

## 2015-12-03 LAB — LACTIC ACID, PLASMA
Lactic Acid, Venous: 3 mmol/L (ref 0.5–2.0)
Lactic Acid, Venous: 2.7 mmol/L (ref 0.5–2.0)

## 2015-12-03 LAB — I-STAT ARTERIAL BLOOD GAS, ED
Acid-Base Excess: 7 mmol/L — ABNORMAL HIGH (ref 0.0–2.0)
Bicarbonate: 30.7 mEq/L — ABNORMAL HIGH (ref 20.0–24.0)
O2 SAT: 89 %
PCO2 ART: 36.4 mmHg (ref 35.0–45.0)
PO2 ART: 49 mmHg — AB (ref 80.0–100.0)
Patient temperature: 97.8
TCO2: 32 mmol/L (ref 0–100)
pH, Arterial: 7.532 — ABNORMAL HIGH (ref 7.350–7.450)

## 2015-12-03 LAB — CBC
HEMATOCRIT: 36.1 % — AB (ref 39.0–52.0)
Hemoglobin: 11.3 g/dL — ABNORMAL LOW (ref 13.0–17.0)
MCH: 28.7 pg (ref 26.0–34.0)
MCHC: 31.3 g/dL (ref 30.0–36.0)
MCV: 91.6 fL (ref 78.0–100.0)
PLATELETS: 134 10*3/uL — AB (ref 150–400)
RBC: 3.94 MIL/uL — ABNORMAL LOW (ref 4.22–5.81)
RDW: 19.2 % — AB (ref 11.5–15.5)
WBC: 21.9 10*3/uL — AB (ref 4.0–10.5)

## 2015-12-03 LAB — TROPONIN I
TROPONIN I: 0.16 ng/mL — AB (ref <0.031)
Troponin I: 0.17 ng/mL — ABNORMAL HIGH (ref <0.031)
Troponin I: 0.14 ng/mL — ABNORMAL HIGH (ref <0.031)
Troponin I: 0.08 ng/mL — ABNORMAL HIGH (ref <0.031)

## 2015-12-03 LAB — RAPID URINE DRUG SCREEN, HOSP PERFORMED
AMPHETAMINES: NOT DETECTED
Barbiturates: NOT DETECTED
Benzodiazepines: POSITIVE — AB
COCAINE: NOT DETECTED
OPIATES: POSITIVE — AB
Tetrahydrocannabinol: NOT DETECTED

## 2015-12-03 LAB — INFLUENZA PANEL BY PCR (TYPE A & B)
H1N1 flu by pcr: NOT DETECTED
INFLBPCR: NEGATIVE
Influenza A By PCR: NEGATIVE

## 2015-12-03 LAB — URINE MICROSCOPIC-ADD ON: RBC / HPF: NONE SEEN RBC/hpf (ref 0–5)

## 2015-12-03 LAB — I-STAT CG4 LACTIC ACID, ED
LACTIC ACID, VENOUS: 3.38 mmol/L — AB (ref 0.5–2.0)
LACTIC ACID, VENOUS: 3.61 mmol/L — AB (ref 0.5–2.0)

## 2015-12-03 LAB — ETHANOL

## 2015-12-03 LAB — APTT
APTT: 40 s — AB (ref 24–37)
aPTT: 118 seconds — ABNORMAL HIGH (ref 24–37)

## 2015-12-03 LAB — I-STAT TROPONIN, ED: TROPONIN I, POC: 0.16 ng/mL — AB (ref 0.00–0.08)

## 2015-12-03 LAB — GLUCOSE, CAPILLARY: GLUCOSE-CAPILLARY: 160 mg/dL — AB (ref 65–99)

## 2015-12-03 LAB — PATHOLOGIST SMEAR REVIEW

## 2015-12-03 LAB — PROTIME-INR
INR: 3.91 — AB (ref 0.00–1.49)
Prothrombin Time: 37.3 seconds — ABNORMAL HIGH (ref 11.6–15.2)

## 2015-12-03 MED ORDER — ATORVASTATIN CALCIUM 10 MG PO TABS
10.0000 mg | ORAL_TABLET | Freq: Every day | ORAL | Status: DC
  Administered 2015-12-03 – 2015-12-08 (×5): 10 mg via ORAL
  Filled 2015-12-03 (×7): qty 1

## 2015-12-03 MED ORDER — VALPROATE SODIUM 500 MG/5ML IV SOLN
1000.0000 mg | Freq: Once | INTRAVENOUS | Status: AC
  Administered 2015-12-03: 1000 mg via INTRAVENOUS
  Filled 2015-12-03: qty 10

## 2015-12-03 MED ORDER — VANCOMYCIN HCL IN DEXTROSE 750-5 MG/150ML-% IV SOLN
750.0000 mg | INTRAVENOUS | Status: DC
  Administered 2015-12-04: 750 mg via INTRAVENOUS
  Filled 2015-12-03: qty 150

## 2015-12-03 MED ORDER — FLUTICASONE PROPIONATE 50 MCG/ACT NA SUSP
1.0000 | Freq: Every day | NASAL | Status: DC
  Administered 2015-12-03 – 2015-12-08 (×6): 1 via NASAL
  Filled 2015-12-03: qty 16

## 2015-12-03 MED ORDER — AMIODARONE HCL 200 MG PO TABS
200.0000 mg | ORAL_TABLET | Freq: Every day | ORAL | Status: DC
  Administered 2015-12-03 – 2015-12-08 (×6): 200 mg via ORAL
  Filled 2015-12-03 (×7): qty 1

## 2015-12-03 MED ORDER — RIVAROXABAN 15 MG PO TABS
15.0000 mg | ORAL_TABLET | Freq: Every day | ORAL | Status: DC
  Administered 2015-12-03 – 2015-12-08 (×6): 15 mg via ORAL
  Filled 2015-12-03 (×6): qty 1

## 2015-12-03 MED ORDER — HEPARIN (PORCINE) IN NACL 100-0.45 UNIT/ML-% IJ SOLN
1200.0000 [IU]/h | INTRAMUSCULAR | Status: DC
  Administered 2015-12-03: 1400 [IU]/h via INTRAVENOUS
  Filled 2015-12-03 (×2): qty 250

## 2015-12-03 MED ORDER — ARFORMOTEROL TARTRATE 15 MCG/2ML IN NEBU
15.0000 ug | INHALATION_SOLUTION | Freq: Two times a day (BID) | RESPIRATORY_TRACT | Status: DC
  Administered 2015-12-03: 15 ug via RESPIRATORY_TRACT
  Filled 2015-12-03: qty 2

## 2015-12-03 MED ORDER — SODIUM CHLORIDE 0.9 % IV SOLN
INTRAVENOUS | Status: DC
  Administered 2015-12-03: 04:00:00 via INTRAVENOUS

## 2015-12-03 MED ORDER — GUAIFENESIN ER 600 MG PO TB12
600.0000 mg | ORAL_TABLET | Freq: Two times a day (BID) | ORAL | Status: DC
  Administered 2015-12-03: 600 mg via ORAL
  Filled 2015-12-03: qty 1

## 2015-12-03 MED ORDER — BUDESONIDE 0.25 MG/2ML IN SUSP
0.2500 mg | Freq: Two times a day (BID) | RESPIRATORY_TRACT | Status: DC
  Administered 2015-12-03: 0.25 mg via RESPIRATORY_TRACT
  Filled 2015-12-03: qty 2

## 2015-12-03 MED ORDER — DEXTROSE 5 % IV SOLN
2.0000 g | Freq: Once | INTRAVENOUS | Status: AC
  Administered 2015-12-03: 2 g via INTRAVENOUS
  Filled 2015-12-03: qty 2

## 2015-12-03 MED ORDER — SODIUM CHLORIDE 0.9 % IV BOLUS (SEPSIS)
1000.0000 mL | INTRAVENOUS | Status: AC
  Administered 2015-12-03 (×2): 1000 mL via INTRAVENOUS

## 2015-12-03 MED ORDER — SODIUM CHLORIDE 0.9 % IV SOLN: 1000.0000 mL | INTRAVENOUS | Status: DC

## 2015-12-03 MED ORDER — ENSURE ENLIVE PO LIQD
237.0000 mL | Freq: Two times a day (BID) | ORAL | Status: DC
  Administered 2015-12-04 – 2015-12-08 (×9): 237 mL via ORAL
  Filled 2015-12-03 (×10): qty 237

## 2015-12-03 MED ORDER — RESOURCE THICKENUP CLEAR PO POWD
Freq: Once | ORAL | Status: DC
  Filled 2015-12-03: qty 125

## 2015-12-03 MED ORDER — VALPROATE SODIUM 500 MG/5ML IV SOLN
500.0000 mg | Freq: Two times a day (BID) | INTRAVENOUS | Status: DC
  Administered 2015-12-03 – 2015-12-05 (×4): 500 mg via INTRAVENOUS
  Filled 2015-12-03 (×5): qty 5

## 2015-12-03 MED ORDER — DONEPEZIL HCL 10 MG PO TABS
5.0000 mg | ORAL_TABLET | Freq: Every day | ORAL | Status: DC
  Administered 2015-12-04: 5 mg via ORAL
  Filled 2015-12-03: qty 1

## 2015-12-03 MED ORDER — SODIUM CHLORIDE 0.9 % IV BOLUS (SEPSIS)
500.0000 mL | INTRAVENOUS | Status: AC
  Administered 2015-12-03: 500 mL via INTRAVENOUS

## 2015-12-03 MED ORDER — ALBUTEROL SULFATE (2.5 MG/3ML) 0.083% IN NEBU
3.0000 mL | INHALATION_SOLUTION | Freq: Four times a day (QID) | RESPIRATORY_TRACT | Status: DC | PRN
  Administered 2015-12-05: 3 mL via RESPIRATORY_TRACT
  Filled 2015-12-03: qty 3

## 2015-12-03 MED ORDER — ACETAMINOPHEN 650 MG RE SUPP
650.0000 mg | Freq: Once | RECTAL | Status: AC
  Administered 2015-12-03: 650 mg via RECTAL
  Filled 2015-12-03: qty 1

## 2015-12-03 MED ORDER — DEXTROSE 5 % IV SOLN
1.0000 g | INTRAVENOUS | Status: DC
  Administered 2015-12-03 – 2015-12-07 (×4): 1 g via INTRAVENOUS
  Filled 2015-12-03 (×5): qty 1

## 2015-12-03 MED ORDER — IPRATROPIUM-ALBUTEROL 0.5-2.5 (3) MG/3ML IN SOLN
3.0000 mL | Freq: Four times a day (QID) | RESPIRATORY_TRACT | Status: DC
  Administered 2015-12-03 – 2015-12-04 (×5): 3 mL via RESPIRATORY_TRACT
  Filled 2015-12-03 (×6): qty 3

## 2015-12-03 MED ORDER — DEXTROSE 5 % IV SOLN
500.0000 mg | Freq: Three times a day (TID) | INTRAVENOUS | Status: DC
  Administered 2015-12-03: 500 mg via INTRAVENOUS
  Filled 2015-12-03 (×3): qty 0.5

## 2015-12-03 MED ORDER — IPRATROPIUM-ALBUTEROL 0.5-2.5 (3) MG/3ML IN SOLN
3.0000 mL | Freq: Once | RESPIRATORY_TRACT | Status: AC
  Administered 2015-12-03: 3 mL via RESPIRATORY_TRACT
  Filled 2015-12-03: qty 3

## 2015-12-03 MED ORDER — VANCOMYCIN HCL IN DEXTROSE 1-5 GM/200ML-% IV SOLN: 1000.0000 mg | Freq: Once | INTRAVENOUS | Status: DC

## 2015-12-03 MED ORDER — VANCOMYCIN HCL IN DEXTROSE 1-5 GM/200ML-% IV SOLN
1000.0000 mg | Freq: Once | INTRAVENOUS | Status: AC
  Administered 2015-12-03: 1000 mg via INTRAVENOUS
  Filled 2015-12-03: qty 200

## 2015-12-03 MED ORDER — DEXTROSE 5 % IV SOLN: 2.0000 g | Freq: Once | INTRAVENOUS | Status: DC

## 2015-12-03 MED ORDER — TAMSULOSIN HCL 0.4 MG PO CAPS
0.4000 mg | ORAL_CAPSULE | Freq: Every day | ORAL | Status: DC
  Administered 2015-12-03 – 2015-12-06 (×4): 0.4 mg via ORAL
  Filled 2015-12-03 (×5): qty 1

## 2015-12-03 MED ORDER — GUAIFENESIN 100 MG/5ML PO SOLN
10.0000 mL | Freq: Three times a day (TID) | ORAL | Status: DC
  Administered 2015-12-03 – 2015-12-08 (×20): 200 mg via ORAL
  Filled 2015-12-03: qty 5
  Filled 2015-12-03 (×20): qty 10

## 2015-12-03 NOTE — Progress Notes (Addendum)
ANTICOAGULATION/ANTIBIOTIC CONSULT NOTE - Initial Consult  Pharmacy Consult for heparin and Vancocin/Azactam Indication: Afib and r/o PNA   Patient Measurements: Height: 5\' 11"  (180.3 cm) Weight: 145 lb 4.5 oz (65.9 kg) IBW/kg (Calculated) : 75.3  Vital Signs: Temp: 97.8 F (36.6 C) (03/30 1200) Temp Source: Oral (03/30 1200) BP: 120/49 mmHg (03/30 1200) Pulse Rate: 60 (03/30 1200)  Labs:  Recent Labs  12/01/15 0257 12/02/15 2340 12/02/15 2352 12/03/15 0515 12/03/15 0948 12/03/15 1244  HGB 9.6* 11.3* 13.6 8.9*  --   --   HCT 32.1* 36.1* 40.0 28.7*  --   --   PLT 125* 134*  --  108*  --   --   APTT  --  40*  --   --   --  118*  LABPROT  --  37.3*  --   --   --   --   INR  --  3.91*  --   --   --   --   CREATININE 2.09* 2.56* 2.30* 2.36*  --   --   TROPONINI  --   --   --  0.17* 0.14*  --     Estimated Creatinine Clearance: 20.2 mL/min (by C-G formula based on Cr of 2.36).   Medical History: Past Medical History  Diagnosis Date  . CAD (coronary artery disease)   . Hypertension   . Vitamin D deficiency   . Chronic back pain   . Anemia   . ED (erectile dysfunction)   . Arthritis   . RLS (restless legs syndrome)   . GERD (gastroesophageal reflux disease)   . COPD (chronic obstructive pulmonary disease) ([place])   . Hyperlipidemia   . Atrial fibrillation ([place])   . Second degree Mobitz II AV block     s/p PPM  . Asthma 09/15/2011  . Long term current use of anticoagulant 09/15/2011  . Thrombocytopenia ([place]) 09/15/2011  . Lymphocytosis   . CHF (congestive heart failure) ([place]) 01/01/2013  . Allergy     Medications:  Prescriptions prior to admission  Medication Sig Dispense Refill Last Dose  . albuterol (PROVENTIL HFA;VENTOLIN HFA) 108 (90 BASE) MCG/ACT inhaler Inhale 2 puffs into the lungs every 6 (six) hours as needed. For wheezing or shortness of breath 18 g 11 unk  . Amino Acids-Protein Hydrolys (FEEDING SUPPLEMENT, PRO-STAT SUGAR FREE 64,) LIQD Take 30 mLs by  mouth 2 (two) times daily. 900 mL 0 unk  . amiodarone (PACERONE) 200 MG tablet Take 1 tablet (200 mg total) by mouth daily. 30 tablet 0 unk  . arformoterol (BROVANA) 15 MCG/2ML NEBU Take 2 mLs (15 mcg total) by nebulization 2 (two) times daily. 120 mL 0 unk  . atorvastatin (LIPITOR) 10 MG tablet Take 1 tablet (10 mg total) by mouth daily. 30 tablet 0 unk  . budesonide (PULMICORT) 0.25 MG/2ML nebulizer solution Take 2 mLs (0.25 mg total) by nebulization 2 (two) times daily. 60 mL 12 unk  . donepezil (ARICEPT) 5 MG tablet Take 1 tablet (5 mg total) by mouth at bedtime. 90 tablet 1 unk  . FLONASE 50 MCG/ACT nasal spray Place 1 spray into both nostrils daily. 1 g 2 unk  . furosemide (LASIX) 40 MG tablet Take 1 tablet (40 mg total) by mouth daily. 30 tablet 6 unk  . guaiFENesin (MUCINEX) 600 MG 12 hr tablet Take 1 tablet (600 mg total) by mouth 2 (two) times daily. 30 tablet 0 unk  . Multiple Vitamin (MULTIVITAMIN) tablet Take 1 tablet by mouth  every morning.    unk  . polyethylene glycol (MIRALAX / GLYCOLAX) packet Take 17 g by mouth daily. 14 each 0 unk  . senna-docusate (SENOKOT-S) 8.6-50 MG tablet Take 1 tablet by mouth at bedtime. 30 tablet 0 unk  . tamsulosin (FLOMAX) 0.4 MG CAPS capsule TAKE ONE CAPSULE BY MOUTH ONCE DAILY 90 capsule 3 unk  . XARELTO 15 MG TABS tablet TAKE ONE TABLET BY MOUTH ONCE DAILY WITH SUPPER 30 tablet 3 unk   Scheduled:  . amiodarone  200 mg Oral Daily  . arformoterol  15 mcg Nebulization BID  . atorvastatin  10 mg Oral Daily  . budesonide  0.25 mg Nebulization BID  . ceFEPime (MAXIPIME) IV  1 g Intravenous Q24H  . donepezil  5 mg Oral QHS  . feeding supplement (ENSURE ENLIVE)  237 mL Oral BID BM  . fluticasone  1 spray Each Nare Daily  . guaiFENesin  600 mg Oral BID  . [place]   Oral Once  . tamsulosin  0.4 mg Oral Daily  . valproate sodium  500 mg Intravenous Q12H  . [START ON 12/04/2015] vancomycin  750 mg Intravenous Q24H     Assessment: 82yo male presents as code stroke ~2d after discharge, code stroke subsequently canceled. Xarelto for afib on hold while in ICU and transitioned to heparin gtt. Current aPTT 118 (goal 66-102) and HL px on 1400 units/hr, weight has been updated to 65 kg from 75 kg. CBC stable with no sxs of bleeding   Goal of Therapy:  Heparin level 0.3-0.7 units/ml aPTT 66-102 seconds  Monitor platelets by anticoagulation protocol: Yes   Plan:  1. Reduce heparin to 1200 units/hr 2. Redraw aPTT in around 8 hours  3. Daily HL and aPTT until levels correlate  Vincenza Hews, PharmD, BCPS 12/03/2015, 2:33 PM Pager: [phone]     Addendum: asked to transition back to home dose of xarelto and stop heparin gtt. Resume 15 mg xarelto 17:00  Vincenza Hews, PharmD, BCPS 12/03/2015, 3:02 PM Pager: [phone]

## 2015-12-03 NOTE — H&P (Signed)
[facility] History and Physical  [name] [name] [id number] DOB: 09/23/1925 DOA: 12/02/2015  Referring physician: Dr. Leonides Schanz. PCP: Odette Fraction, MD  Specialists: None.  Chief Complaint: Seizures and right-sided weakness.  History obtained from ER physician since patient is demented and confused at this time.  HPI: [name] [name] is a 82 y.o. male who was recently admitted for acute respiratory failure requiring intubation and has had V. fib arrest was brought to the ER from the nursing home after patient was found to have a seizure and right-sided weakness. CT of the head did not show any acute. 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 Patient has chronic leukocytosis and patient ` yes and was found to be elevated with patient being mildly febrile and chest x-ray showing infiltrate patient is started on IV fluids antibiotics for pneumonia and admitted for further management. Patient on my exam is confused and oriented to his name only. Unable to move his right extremities. He had physician as discussed with patient's sister who has confirmed DO NOT RESUSCITATE status.   Review of Systems: As presented in the history of presenting illness, rest negative.  Past Medical History  Diagnosis Date  . CAD (coronary artery disease)   . Hypertension   . Vitamin D deficiency   . Chronic back pain   . Anemia   . ED (erectile dysfunction)   . Arthritis   . RLS (restless legs syndrome)   . GERD (gastroesophageal reflux disease)   . COPD (chronic obstructive pulmonary disease) ([place])   . Hyperlipidemia   . Atrial fibrillation ([place])   . Second degree Mobitz II AV block     s/p PPM  . Asthma 09/15/2011  . Long term current use of anticoagulant 09/15/2011  . Thrombocytopenia ([place]) 09/15/2011  . Lymphocytosis   . CHF (congestive heart failure) ([place]) 01/01/2013  . Allergy     Past Surgical History  Procedure Laterality Date  . Coronary artery bypass graft    . Cataract extraction    . Pacemaker insertion     Social History:  reports that he quit smoking about 7 years ago. His smoking use included Cigarettes. He has a 240 pack-year smoking history. He has never used smokeless tobacco. He reports that he does not drink alcohol or use illicit drugs. Where does patient live Nursing home. Can patient participate in ADLs? No.  Allergies  Allergen Reactions  . Amoxicillin Itching and Rash  . Penicillins Itching and Rash    Has patient had a PCN reaction causing immediate rash, facial/tongue/throat swelling, SOB or lightheadedness with hypotension: Yes Has patient had a PCN reaction causing severe rash involving mucus membranes or skin necrosis: No Has patient had a PCN reaction that required hospitalization No Has patient had a PCN reaction occurring within the last 10 years: No If all of the above answers are "NO", then may proceed with Cephalosporin use.     Family History:  Family History  Problem Relation Age of Onset  . Diabetes Sister   . Cancer Mother     lung cancer  . Stroke Father   . Cancer Brother     brain cancer  . Alcohol abuse Brother       Prior to Admission medications   Medication Sig Start Date End Date Taking? Authorizing Provider  albuterol (PROVENTIL HFA;VENTOLIN HFA) 108 (90 BASE) MCG/ACT inhaler Inhale 2 puffs into the lungs every  6 (six) hours as needed. For wheezing or shortness of breath 07/07/14  Yes Susy Frizzle, MD  Amino Acids-Protein Hydrolys (FEEDING SUPPLEMENT, PRO-STAT SUGAR FREE 64,) LIQD Take 30 mLs by mouth 2 (two) times daily. 11/29/15  Yes Florencia Reasons, MD  amiodarone (PACERONE) 200 MG tablet Take 1 tablet (200 mg total) by mouth daily. 11/29/15  Yes Florencia Reasons, MD  arformoterol (BROVANA) 15 MCG/2ML NEBU Take 2 mLs (15 mcg total) by nebulization 2 (two) times daily. 11/29/15  Yes Florencia Reasons, MD  atorvastatin (LIPITOR) 10  MG tablet Take 1 tablet (10 mg total) by mouth daily. 11/29/15  Yes Florencia Reasons, MD  budesonide (PULMICORT) 0.25 MG/2ML nebulizer solution Take 2 mLs (0.25 mg total) by nebulization 2 (two) times daily. 11/29/15  Yes Florencia Reasons, MD  donepezil (ARICEPT) 5 MG tablet Take 1 tablet (5 mg total) by mouth at bedtime. 01/09/15  Yes Susy Frizzle, MD  FLONASE 50 MCG/ACT nasal spray Place 1 spray into both nostrils daily. 01/12/15 01/12/16 Yes Earleen Newport, MD  furosemide (LASIX) 40 MG tablet Take 1 tablet (40 mg total) by mouth daily. 11/18/14  Yes Deboraha Sprang, MD  guaiFENesin (MUCINEX) 600 MG 12 hr tablet Take 1 tablet (600 mg total) by mouth 2 (two) times daily. 11/29/15  Yes Florencia Reasons, MD  Multiple Vitamin (MULTIVITAMIN) tablet Take 1 tablet by mouth every morning.    Yes Historical Provider, MD  polyethylene glycol (MIRALAX / GLYCOLAX) packet Take 17 g by mouth daily. 11/29/15  Yes Florencia Reasons, MD  senna-docusate (SENOKOT-S) 8.6-50 MG tablet Take 1 tablet by mouth at bedtime. 11/29/15  Yes Florencia Reasons, MD  tamsulosin (FLOMAX) 0.4 MG CAPS capsule TAKE ONE CAPSULE BY MOUTH ONCE DAILY 04/27/15  Yes Susy Frizzle, MD  XARELTO 15 MG TABS tablet TAKE ONE TABLET BY MOUTH ONCE DAILY WITH SUPPER 08/05/15  Yes Deboraha Sprang, MD    Physical Exam: Filed Vitals:   12/03/15 0316 12/03/15 0321 12/03/15 0339 12/03/15 0400  BP:  108/47 122/111 107/50  Pulse:  61  60  Temp: 99.1 F (37.3 C)     TempSrc: Rectal     Resp:  22 14 17   Weight:      SpO2:  100%  100%     General:  Moderately built and poorly nourished.  Eyes: Anicteric. No pallor.  ENT: No discharge from the ears eyes nose mouth.  Neck: No JVD appreciated no mass felt.  Cardiovascular: S1 and S2 heard.  Respiratory: No rhonchi or crepitations.  Abdomen: Soft nontender bowel sounds present.  Skin: No rash.  Musculoskeletal: No edema.  Psychiatric: Appears confused.  Neurologic: Oriented to his name only. Right-sided weakness.  Labs on  Admission:  Basic Metabolic Panel:  Recent Labs Lab 11/28/15 0425 11/29/15 0439 11/30/15 0419 12/01/15 0257 12/02/15 2340 12/02/15 2352  NA 145 143 143 143 145 144  K 4.0 4.1 3.9 4.3 3.8 3.8  CL 104 102 101 101 102 102  CO2 34* 36* 33* 34* 28  --   GLUCOSE 107* 115* 140* 129* 156* 150*  BUN 27* 23* 26* 29* 49* 53*  CREATININE 1.83* 1.67* 1.90* 2.09* 2.56* 2.30*  CALCIUM 8.8* 8.9 8.9 8.8* 9.2  --    Liver Function Tests:  Recent Labs Lab 12/02/15 2340  AST 30  ALT 16*  ALKPHOS 122  BILITOT 1.0  PROT 5.8*  ALBUMIN 2.4*   No results for input(s): LIPASE, AMYLASE in the last 168 hours. No results  for input(s): AMMONIA in the last 168 hours. CBC:  Recent Labs Lab 11/27/15 0430 11/28/15 0425 11/29/15 0439 11/30/15 0419 12/01/15 0257 12/02/15 2340 12/02/15 2352  WBC 15.3* 19.1* 15.6* 18.6* 19.6* 21.9*  --   NEUTROABS 3.4  --  3.1  --   --  10.5*  --   HGB 8.3* 9.1* 8.4* 9.5* 9.6* 11.3* 13.6  HCT 28.2* 29.6* 27.3* 32.4* 32.1* 36.1* 40.0  MCV 91.0 91.1 91.3 91.3 93.0 91.6  --   PLT 114* 116* 113* 127* 125* 134*  --    Cardiac Enzymes:  Recent Labs Lab 11/26/15 1250 11/26/15 1827 11/26/15 2315  TROPONINI 0.05* 0.06* 0.06*    BNP (last 3 results)  Recent Labs  11/10/15 1456 11/16/15 0353 12/02/15 2345  BNP 1792.8* 406.2* 488.1*    ProBNP (last 3 results) No results for input(s): PROBNP in the last 8760 hours.  CBG:  Recent Labs Lab 12/01/15 0655 12/01/15 1259 12/01/15 1643 12/01/15 2248 12/03/15 0346  GLUCAP 123* 127* 116* 133* 160*    Radiological Exams on Admission: Ct Head Wo Contrast  12/03/2015  CLINICAL DATA:  Code stroke. EXAM: CT HEAD WITHOUT CONTRAST TECHNIQUE: Contiguous axial images were obtained from the base of the skull through the vertex without intravenous contrast. COMPARISON:  09/05/2012 head CT. FINDINGS: No evidence of parenchymal hemorrhage or extra-axial fluid collection. No mass lesion, mass effect, or midline shift.  No CT evidence of acute transcortical infarction. New hypodense focus in the left thalamus, probably and lacunar infarct. Stable encephalomalacia in the left frontal lobe. Stable left cerebellar hemisphere infarct. Additional tiny hypodense foci in the bilateral cerebellar hemispheres appear new. Intracranial atherosclerosis. Nonspecific prominent subcortical and periventricular white matter hypodensity, most in keeping with chronic small vessel ischemic change. Diffuse cerebral volume loss. Cerebral ventricle sizes are stable and concordant with the degree of cerebral volume loss. The visualized paranasal sinuses are essentially clear. The mastoid air cells are unopacified. No evidence of calvarial fracture. IMPRESSION: 1. No acute intracranial hemorrhage. 2. New hypodense focus in the left thalamus. New tiny hypodense foci in the bilateral cerebellar hemispheres. These likely represent tiny infarcts of uncertain acuity, favor chronic. No mass-effect or midline shift. 3. Stable left frontal encephalomalacia. Diffuse cerebral volume loss and prominent chronic small vessel ischemia. These results were called by telephone at the time of interpretation on 12/03/2015 at 12:03 am to Dr. Leonel Ramsay, who verbally acknowledged these results. Electronically Signed   By: Ilona Sorrel M.D.   On: 12/03/2015 00:04   Dg Chest Portable 1 View  12/03/2015  CLINICAL DATA:  82 year old male with shortness of breath EXAM: PORTABLE CHEST 1 VIEW COMPARISON:  Radiograph dated 11/30/2015 FINDINGS: Single-view of the chest demonstrates emphysematous changes of the lungs. There is an area of increased hazy airspace density at the right lung base slightly progressed compared to prior study and concerning for developing pneumonia. Small bilateral pleural effusions noted. No pneumothorax. Stable cardiomegaly. Left pectoral pacemaker device. Median sternotomy wires and CABG vascular clips. No acute fracture. IMPRESSION: Emphysema with small  bilateral pleural effusions and possible developing pneumonia at the right base. Electronically Signed   By: Anner Crete M.D.   On: 12/03/2015 00:31    EKG: Independently reviewed. Normal sinus rhythm with RBBB.  Assessment/Plan Principal Problem:   Sepsis ([place]) Active Problems:   HYPERTENSION, BENIGN   CORONARY ATHEROSCLEROSIS, ARTERY BYPASS GRAFT   Mobitz type II atrioventricular block   Permanent atrial fibrillation (HCC)   Seizures ([place])   1. Sepsis  from pneumonia - patient has been placed on empiric antibiotics vancomycin and Azactam for healthcare associated pneumonia. Continue with hydration caution the patient does have systolic heart failure from recent 2-D echo showing EF of 25-30%. Follow pro-calcitonin levels that it hasn't levels blood cultures influenza PCR. 2. New onset seizure with right-sided weakness - appreciate neurology consult. Patient is on Depakote. If right-sided weakness versus neurology has recommended repeat CT head. Unable to do MRI due to pacemaker. 3. Permanent atrial fibrillation presently in sinus rhythm with history of pacemaker placement for Mobitz type II block - patient is a result of but since patient is confused we will get swallow therapy consult. Patient will be on heparin. Closely monitor for any elevation of heart rate. 4. Chronic kidney disease stage III - closely monitor metabolic panel. 5. Systolic heart failure with last EF measured this month was 25-30% - patient is on Lasix but is on hold due to elevated lactic acid levels. Start Lasix as soon as possible once lactic gets cleared. 6. History of recent admission for V. fib arrest and acute respiratory failure from pneumonia. 7. CAD - check cardiac markers. 8. History of hypertension - closely monitor blood pressure trends. 9. Dementia on Aricept.   DVT Prophylaxis heparin infusion.  Code Status: DO NOT RESUSCITATE.  Family Communication: No family at the bedside.  Disposition Plan:  Admit to inpatient.    Casen Pryor N. [facility] Pager [phone].  If 7PM-7AM, please contact night-coverage [email or web address] Password [facility] 12/03/2015, 4:27 AM

## 2015-12-03 NOTE — Progress Notes (Signed)
Pharmacy Antibiotic Note  [name] [name] is a 82 y.o. male admitted on 12/02/2015 with sepsis.  Pharmacy has been consulted for cefepime vancomycin dosing.  Plan: Continue vancomycin Pt has tolerated cefepime in past, so d/c azactam and initiate 1 gm cefepime q 24h  Weight: 145 lb 4.5 oz (65.9 kg)  Temp (24hrs), Avg:99 F (37.2 C), Min:97.6 F (36.4 C), Max:102.4 F (39.1 C)   Recent Labs Lab 11/29/15 0439 11/30/15 0419 12/01/15 0257 12/02/15 2340 12/02/15 2352 12/03/15 0051 12/03/15 0253 12/03/15 0515 12/03/15 0722  WBC 15.6* 18.6* 19.6* 21.9*  --   --   --  20.0*  --   CREATININE 1.67* 1.90* 2.09* 2.56* 2.30*  --   --  2.36*  --   LATICACIDVEN  --   --   --   --   --  3.38* 3.61* 3.0* 2.7*    Estimated Creatinine Clearance: 20.2 mL/min (by C-G formula based on Cr of 2.36).    Allergies  Allergen Reactions  . Amoxicillin Itching and Rash  . Penicillins Itching and Rash    Has patient had a PCN reaction causing immediate rash, facial/tongue/throat swelling, SOB or lightheadedness with hypotension: Yes Has patient had a PCN reaction causing severe rash involving mucus membranes or skin necrosis: No Has patient had a PCN reaction that required hospitalization No Has patient had a PCN reaction occurring within the last 10 years: No If all of the above answers are "NO", then may proceed with Cephalosporin use.    Levester Fresh, PharmD, BCPS, [facility] Clinical Pharmacist Pager [phone] 12/03/2015 11:16 AM

## 2015-12-03 NOTE — Plan of Care (Signed)
[facility] PLAN OF CARE NOTE Patient: [name] [name] [id number]   PCP: Odette Fraction, MD DOB: Nov 13, 1925   DOA: 12/02/2015   DOS: 12/03/2015    Patient was admitted by my colleague Dr.Kakrakandy earlier on 12/03/2015. I have reviewed the H&P as well as assessment and plan and agree with the same. Important changes in the plan are listed below.   Plan of care: Principal Problem:   Sepsis ([place]) Change IV antibiotics to cefepime for broader coverage.  the patient has been able to tolerate ceftriaxone in the past.  Active Problems: Elevated troponin. Trending downwards at present. Likely combination of acute on chronic kidney injury as well as demand ischemia.  Dysphagia. Speech therapy evaluation appreciated. On dysphagia type I diet  New onset seizure. No etiology at present identified. Likely metabolic. No further seizure identified so far.    Author: Berle Mull, MD [facility] Pager: [phone] 12/03/2015 12:12 PM   If 7PM-7AM, please contact night-coverage at [email or web address], password [facility]

## 2015-12-03 NOTE — Consult Note (Addendum)
Neurology Consultation Reason for Consult: Code Stroke Referring Physician: Whitacre, K  CC: Seizure  History is obtained from: EMS  HPI: [name] [name] is a 82 y.o. male with a history of Dementia who was just recently hospitalized with a prolonged stabilization following flu cardiac arrest on 3/10. He had an elevation in his creatinine following this. He was last seen well around 8:15, and then several hours later staff walked and found him actively seizing. Duration is unknown. EMS was called and on their arrival, he was found to have weakness and therefore code stroke was called. En route, he began having active seizure activity lasting approximately 2 minutes which aborted following 2 mg of IV Versed. He had this between 5 and 10 minutes prior to arrival and was severely postictal on arrival.EMS states that with the second seizure, he had rightward gaze deviation.  Following his arrival, he had improvement in his mental status and continues to appear to be gradually improving, though not yet back to baseline.   LKW: 8:15 pm tpa given?: no, anticoagulater    ROS:  Unable to obtain due to altered mental status.   Past Medical History  Diagnosis Date  . CAD (coronary artery disease)   . Hypertension   . Vitamin D deficiency   . Chronic back pain   . Anemia   . ED (erectile dysfunction)   . Arthritis   . RLS (restless legs syndrome)   . GERD (gastroesophageal reflux disease)   . COPD (chronic obstructive pulmonary disease) ([place])   . Hyperlipidemia   . Atrial fibrillation ([place])   . Second degree Mobitz II AV block     s/p PPM  . Asthma 09/15/2011  . Long term current use of anticoagulant 09/15/2011  . Thrombocytopenia ([place]) 09/15/2011  . Lymphocytosis   . CHF (congestive heart failure) ([place]) 01/01/2013  . Allergy      Family History  Problem Relation Age of Onset  . Diabetes Sister   . Cancer Mother     lung cancer  . Stroke Father   . Cancer Brother     brain cancer  .  Alcohol abuse Brother      Social History:  reports that he quit smoking about 7 years ago. His smoking use included Cigarettes. He has a 240 pack-year smoking history. He has never used smokeless tobacco. He reports that he does not drink alcohol or use illicit drugs.   Exam: Current vital signs: BP 119/57 mmHg  Pulse 82  Temp(Src) 97.8 F (36.6 C) (Oral)  Resp 37  Wt 72 kg (158 lb 11.7 oz)  SpO2 91% Vital signs in last 24 hours: Temp:  [97.8 F (36.6 C)] 97.8 F (36.6 C) (03/29 2359) Pulse Rate:  [82] 82 (03/30 0000) Resp:  [25-37] 37 (03/30 0000) BP: (101-119)/(57-63) 119/57 mmHg (03/30 0000) SpO2:  [89 %-91 %] 91 % (03/30 0000) Weight:  [72 kg (158 lb 11.7 oz)] 72 kg (158 lb 11.7 oz) (03/29 2357)   Physical Exam  Constitutional: Appears elderly Psych: Affect appropriate to situation Eyes: No scleral injection HENT: No OP obstrucion Head: Normocephalic.  Cardiovascular: Normal rate and regular rhythm.  Respiratory: significant upper airway sounds GI: Soft.  No distension. There is no tenderness.  Skin: WDI  Neuro: Mental Status: On arrival, the patient was obtunded, but he rapidly improved following arrival and began fixating and tracking and engaging with the examiner, though does not clearly follow commands. Cranial Nerves: II: clearly blinks to threat from the  left, I think that he does from the right as well but this was tested second and he stopped blinking to threat from either direction after my initial trial where I think he did.Pupils are equal, round, and reactive to light.   III,IV, VI: he tracks across midline in both directions. V: Facial sensation is symmetric to temperature VII: Facial movement appears grossly symmetric. VIII: hearing is intact to voice X: Uvula elevates symmetrically XI: Shoulder shrug is symmetric. XII: tongue is midline without atrophy or fasciculations.  Motor: Tone is normal. Bulk is normal. 5/5 strength was present on the  left, he does appear to have a right hemi-parasis. He does lift the right arm partially out of bed, and withdraws the right leg to noxious stimuli. Sensory: He responds to noxious stimulus in all 4 extremities. Cerebellar: Does not comply.   I have reviewed labs in epic and the results pertinent to this consultation are: creatinine 2.5  I have reviewed the images obtained:CT head-interval development of left frontal encephalomalacia roughly in the distribution of anterior watershed region.  Impression: 82 year old male with a history of dementia who lives in a nursing home presenting with seizure. 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 In any case with his anticoagulation, he is not a candidate for IV TPA and he is currently DNR.  The description of the rightward gaze with seizure would be consistent with a left-sided seizure focus and his area of encephalomalacia would be a potential area. This could be previous stroke from his A. fib or could be sequela of his code event.  Recommendations: 1) EEG in the a.m. 2) Depakote 1 g IV 1 followed by 500 mg twice a day 3) if he continues to have right hemiparesis and aphasia, repeat head CT tomorrow 4) neurology will continue to follow   Roland Rack, MD [facility] [phone]  If 7pm- 7am, please page neurology on call as listed in [place].

## 2015-12-03 NOTE — Care Management Note (Signed)
Case Management Note  Patient Details  Name: [name] [name] MRN: [id number] Date of Birth: 09-23-1925  Subjective/Objective:  Pt admitted on  12/02/15 with sepsis from pneumonia.  PTA, pt resided at skilled nursing facility.  He has a history of dementia.                Action/Plan: Will follow for discharge planning as pt progresses.  CSW consulted to facilitate return to SNF when medically stable.    Expected Discharge Date:                  Expected Discharge Plan:  Skilled Nursing Facility  In-House Referral:  Clinical Social Work  Discharge planning Services  CM Consult  Post Acute Care Choice:    Choice offered to:     DME Arranged:    DME Agency:     HH Arranged:    [place] Agency:     Status of Service:  In process, will continue to follow  Medicare Important Message Given:    Date Medicare IM Given:    Medicare IM give by:    Date Additional Medicare IM Given:    Additional Medicare Important Message give by:     If discussed at [place] of Stay Meetings, dates discussed:    Additional Comments:  Reinaldo Raddle, RN, BSN  Trauma/Neuro ICU Case Manager [phone]

## 2015-12-03 NOTE — Procedures (Signed)
History: [name] [name] is an 82 y.o. male patient with altered mental status . The best Routine inpatient EEG was performed for further evaluation.   Patient Active Problem List   Diagnosis Date Noted  . Sepsis ([place]) 12/03/2015  . Seizures ([place]) 12/03/2015  . Pleural effusion   . Pleural effusion on right   . Acute on chronic systolic congestive heart failure ([place])   . Bacteremia due to Streptococcus   . Encounter for imaging study to confirm orogastric (OG) tube placement   . Endotracheally intubated   . Pressure ulcer 11/14/2015  . Cardiac arrest ([place])   . Polymorphic ventricular tachycardia ([place])   . Protein-calorie malnutrition, severe ([place]) 11/13/2015  . Acute systolic heart failure ([place]) 11/10/2015  . Sepsis due to pneumonia ([place]) 11/10/2015  . Acute encephalopathy   . Influenza with pneumonia   . Hypotension   . Encounter for therapeutic drug monitoring 10/09/2013  . Rotator cuff tear arthropathy of right shoulder 09/04/2013  . Back pain, acute 06/14/2013  . Right shoulder pain 05/03/2013  . Chronic pain in left shoulder 05/03/2013  . Abnormal CT scan, chest 01/10/2013  . Anticoagulated on Coumadin 01/01/2013  . Left shoulder pain 01/01/2013  . CHF (congestive heart failure) ([place]) 01/01/2013  . Permanent atrial fibrillation ([place]) 11/02/2012  . Lymphocytosis   . Balanitis 09/11/2012  . UTI (lower urinary tract infection) 09/05/2012  . Shortness of breath 09/05/2012  . Altered mental status 09/05/2012  . Dementia 08/14/2012  . Acute low back pain 07/02/2012  . Cellulitis of right leg 05/28/2012  . Hematoma 05/17/2012  . Leg pain 05/17/2012  . Pacemaker-St.Jude 04/26/2012  . Eczema 03/14/2012  . Right carotid bruit 01/06/2012  . Long term current use of anticoagulant 09/15/2011  . Thrombocytopenia ([place]) 09/15/2011  . Preventative health care 09/10/2011  . CONSTIPATION 06/01/2010  . CLOSED DISLOCATION OF ACROMIOCLAVICULAR 06/01/2010  . Abdominal aortic aneurysm  ([place]) 01/28/2010  . COSTOCHONDRITIS 10/22/2009  . Cholesterolosis of gallbladder 07/08/2009  . CHOLELITHIASIS 06/30/2009  . Mobitz type II atrioventricular block 06/17/2009  . ATRIAL FIBRILLATION, PAROXYSMAL 06/17/2009  . Coronary atherosclerosis 01/16/2009  . HYPERTENSION, BENIGN 12/31/2008  . BACK PAIN, CHRONIC 12/31/2008  . Edema 12/31/2008  . Hypothyroidism 11/05/2008  . VITAMIN D DEFICIENCY 11/05/2008  . UNS ADVRS EFF OTH RX MEDICINAL&BIOLOGICAL SBSTNC 11/05/2008  . ERECTILE DYSFUNCTION 09/02/2008  . ARTHRITIS, HIP 09/02/2008  . RESTLESS LEG SYNDROME 12/27/2007  . INSOMNIA 12/27/2007  . HYPERLIPIDEMIA 08/15/2007  . CORONARY ATHEROSCLEROSIS, ARTERY BYPASS GRAFT 08/15/2007  . COPD 08/15/2007  . GERD 08/15/2007  . FREQUENCY, URINARY 08/15/2007  . BPH (benign prostatic hypertrophy) 08/15/2007     Current facility-administered medications:  .  albuterol (PROVENTIL) (2.5 MG/3ML) 0.083% nebulizer solution 3 mL, 3 mL, Inhalation, Q6H PRN, Rise Patience, MD .  amiodarone (PACERONE) tablet 200 mg, 200 mg, Oral, Daily, Rise Patience, MD, 200 mg at 12/03/15 1055 .  atorvastatin (LIPITOR) tablet 10 mg, 10 mg, Oral, Daily, Rise Patience, MD, 10 mg at 12/03/15 1055 .  ceFEPIme (MAXIPIME) 1 g in dextrose 5 % 50 mL IVPB, 1 g, Intravenous, Q24H, Wynell Balloon, RPH .  donepezil (ARICEPT) tablet 5 mg, 5 mg, Oral, QHS, Rise Patience, MD .  feeding supplement (ENSURE ENLIVE) (ENSURE ENLIVE) liquid 237 mL, 237 mL, Oral, BID BM, Lavina Hamman, MD, 237 mL at 12/03/15 1455 .  fluticasone (FLONASE) 50 MCG/ACT nasal spray 1 spray, 1 spray, Each Nare, Daily, Rise Patience, MD, 1 spray  at 12/03/15 1055 .  guaiFENesin (ROBITUSSIN) 100 MG/5ML solution 200 mg, 10 mL, Oral, TID PC & HS, Lavina Hamman, MD, 200 mg at 12/03/15 1755 .  ipratropium-albuterol (DUONEB) 0.5-2.5 (3) MG/3ML nebulizer solution 3 mL, 3 mL, Nebulization, Q6H, Lavina Hamman, MD, 3 mL at 12/03/15 1946 .   RESOURCE THICKENUP CLEAR, , Oral, Once, Rise Patience, MD .  Rivaroxaban (XARELTO) tablet 15 mg, 15 mg, Oral, Q supper, Lavina Hamman, MD, 15 mg at 12/03/15 1610 .  tamsulosin (FLOMAX) capsule 0.4 mg, 0.4 mg, Oral, Daily, Rise Patience, MD, 0.4 mg at 12/03/15 1055 .  valproate (DEPACON) 500 mg in dextrose 5 % 50 mL IVPB, 500 mg, Intravenous, Q12H, Greta Doom, MD, 500 mg at 12/03/15 1055 .  [START ON 12/04/2015] vancomycin (VANCOCIN) IVPB 750 mg/150 ml premix, 750 mg, Intravenous, Q24H, Laren Everts, RPH   Introduction:  This is a 19 channel routine scalp EEG performed at the bedside with bipolar and monopolar montages arranged in accordance to the international 10/20 system of electrode placement. One channel was dedicated to EKG recording.   Findings:  The best background activity is about 7 Hz. No definite evidence of abnormal epileptiform discharges or electrographic seizures were noted during this recording.   Impression:  Abnormal awake and drowsy routine inpatient EEG suggestive of mild encephalopathy. Clinical correlation is recommended .  \

## 2015-12-03 NOTE — ED Notes (Signed)
[facility] called/Code Stroke canceled-per Dr. Leonel Ramsay.

## 2015-12-03 NOTE — Progress Notes (Signed)
ANTICOAGULATION/ANTIBIOTIC CONSULT NOTE - Initial Consult  Pharmacy Consult for heparin and Vancocin/Azactam Indication: Afib and r/o PNA  Allergies  Allergen Reactions  . Amoxicillin Itching and Rash  . Penicillins Itching and Rash    Has patient had a PCN reaction causing immediate rash, facial/tongue/throat swelling, SOB or lightheadedness with hypotension: Yes Has patient had a PCN reaction causing severe rash involving mucus membranes or skin necrosis: No Has patient had a PCN reaction that required hospitalization No Has patient had a PCN reaction occurring within the last 10 years: No If all of the above answers are "NO", then may proceed with Cephalosporin use.     Patient Measurements: Weight: 158 lb 11.7 oz (72 kg)  Vital Signs: Temp: 99.1 F (37.3 C) (03/30 0316) Temp Source: Rectal (03/30 0316) BP: 107/50 mmHg (03/30 0400) Pulse Rate: 60 (03/30 0400)  Labs:  Recent Labs  12/01/15 0257 12/02/15 2340 12/02/15 2352  HGB 9.6* 11.3* 13.6  HCT 32.1* 36.1* 40.0  PLT 125* 134*  --   APTT  --  40*  --   LABPROT  --  37.3*  --   INR  --  3.91*  --   CREATININE 2.09* 2.56* 2.30*    Estimated Creatinine Clearance: 22.6 mL/min (by C-G formula based on Cr of 2.3).   Medical History: Past Medical History  Diagnosis Date  . CAD (coronary artery disease)   . Hypertension   . Vitamin D deficiency   . Chronic back pain   . Anemia   . ED (erectile dysfunction)   . Arthritis   . RLS (restless legs syndrome)   . GERD (gastroesophageal reflux disease)   . COPD (chronic obstructive pulmonary disease) ([place])   . Hyperlipidemia   . Atrial fibrillation ([place])   . Second degree Mobitz II AV block     s/p PPM  . Asthma 09/15/2011  . Long term current use of anticoagulant 09/15/2011  . Thrombocytopenia ([place]) 09/15/2011  . Lymphocytosis   . CHF (congestive heart failure) ([place]) 01/01/2013  . Allergy     Medications:  Prescriptions prior to admission  Medication Sig  Dispense Refill Last Dose  . albuterol (PROVENTIL HFA;VENTOLIN HFA) 108 (90 BASE) MCG/ACT inhaler Inhale 2 puffs into the lungs every 6 (six) hours as needed. For wheezing or shortness of breath 18 g 11 unk  . Amino Acids-Protein Hydrolys (FEEDING SUPPLEMENT, PRO-STAT SUGAR FREE 64,) LIQD Take 30 mLs by mouth 2 (two) times daily. 900 mL 0 unk  . amiodarone (PACERONE) 200 MG tablet Take 1 tablet (200 mg total) by mouth daily. 30 tablet 0 unk  . arformoterol (BROVANA) 15 MCG/2ML NEBU Take 2 mLs (15 mcg total) by nebulization 2 (two) times daily. 120 mL 0 unk  . atorvastatin (LIPITOR) 10 MG tablet Take 1 tablet (10 mg total) by mouth daily. 30 tablet 0 unk  . budesonide (PULMICORT) 0.25 MG/2ML nebulizer solution Take 2 mLs (0.25 mg total) by nebulization 2 (two) times daily. 60 mL 12 unk  . donepezil (ARICEPT) 5 MG tablet Take 1 tablet (5 mg total) by mouth at bedtime. 90 tablet 1 unk  . FLONASE 50 MCG/ACT nasal spray Place 1 spray into both nostrils daily. 1 g 2 unk  . furosemide (LASIX) 40 MG tablet Take 1 tablet (40 mg total) by mouth daily. 30 tablet 6 unk  . guaiFENesin (MUCINEX) 600 MG 12 hr tablet Take 1 tablet (600 mg total) by mouth 2 (two) times daily. 30 tablet 0 unk  . Multiple  Vitamin (MULTIVITAMIN) tablet Take 1 tablet by mouth every morning.    unk  . polyethylene glycol (MIRALAX / GLYCOLAX) packet Take 17 g by mouth daily. 14 each 0 unk  . senna-docusate (SENOKOT-S) 8.6-50 MG tablet Take 1 tablet by mouth at bedtime. 30 tablet 0 unk  . tamsulosin (FLOMAX) 0.4 MG CAPS capsule TAKE ONE CAPSULE BY MOUTH ONCE DAILY 90 capsule 3 unk  . XARELTO 15 MG TABS tablet TAKE ONE TABLET BY MOUTH ONCE DAILY WITH SUPPER 30 tablet 3 unk   Scheduled:  . amiodarone  200 mg Oral Daily  . arformoterol  15 mcg Nebulization BID  . atorvastatin  10 mg Oral Daily  . budesonide  0.25 mg Nebulization BID  . donepezil  5 mg Oral QHS  . fluticasone  1 spray Each Nare Daily  . guaiFENesin  600 mg Oral BID  .  tamsulosin  0.4 mg Oral Daily  . valproate sodium  500 mg Intravenous Q12H    Assessment: 82yo male presents as code stroke ~2d after discharge, code stroke subsequently canceled, CXR reveals possible PNA, to begin IV ABX; also to transition from Xarelto to heparin for Afib while in ICU.  Goal of Therapy:  Heparin level 0.3-0.7 units/ml aPTT 66-102 seconds  Vancomycin trough 15-20 Monitor platelets by anticoagulation protocol: Yes   Plan:  Rec'd vanc 1g and cefepime 2g IV in ED; will continue with vancomycin 750mg  IV Q24H and aztreonam 500mg  IV Q8H and monitor CBC and Cx.  Will start heparin gtt at 1400 units/hr (based on requirements during recent admission) and monitor heparin level, aPTT (heparin levels will be unreliable with recent Xarelto administration) and CBC.  Wynona Neat, PharmD, BCPS  12/03/2015,4:27 AM

## 2015-12-03 NOTE — Progress Notes (Signed)
EEG completed, results pending. 

## 2015-12-03 NOTE — ED Notes (Signed)
Admitting MD at bedside.

## 2015-12-03 NOTE — Code Documentation (Signed)
[name] [name] is a 83yo wm presenting to [facility] via [facility] after being found with seizure-like activity and Lt gaze.  Per report the pt was LKW at 2017 at his basline of ambulatory and able to communicate verbally.  He was later discovered having seizure activity.  He had another seizure for EMS with a right gaze and reported Lt side weakness.  EMS gave him Versed 2mg  IV to treat the seizure.  On arrival to [facility] he was nonverbal but awake, unable to follow commands, but able to move the Lt side, not the Rt.  He began to improve some and was able to same several words, but cont. To be unable to follow commands.He was dyspneic on arrival RR 42, sats 89% on 4L, t+102.4.  Code stroke canceled 0040.

## 2015-12-03 NOTE — Progress Notes (Signed)
Initial Nutrition Assessment  DOCUMENTATION CODES:   Severe malnutrition in context of acute illness/injury  INTERVENTION:  -Ensure Enlive po BID, each supplement provides 350 kcal and 20 grams of protein -RD continue to monitor -NDD1 per SLP > advance per SLP   NUTRITION DIAGNOSIS:   Malnutrition related to chronic illness as evidenced by moderate depletions of muscle mass, moderate depletion of body fat.  GOAL:   Patient will meet greater than or equal to 90% of their needs  MONITOR:   Vent status, TF tolerance, Labs, I & O's  REASON FOR ASSESSMENT:   Low Braden    ASSESSMENT:   [name] [name] is a 82 y.o. male who was recently admitted for acute respiratory failure requiring intubation and has had V. fib arrest was brought to the ER from the nursing home after patient was found to have a seizure and right-sided weakness. CT of the head did not show any acute.  Spoke briefly with [name] [name], family at bedside. Family provided him with a banana cream milkshake from steak and shake during visit. He also had a tray, that he had not touched; was asleep when arrived. Per RN, he was eating tray shortly after finishing milkshake as I was writing this note.  He endorses a good appetite, denies wt loss recently. Per chart he exhibits a 7#/4.5% insignificant wt loss over 6 months.  Nutrition-Focused physical exam completed. Findings are moderate fat depletion, severe muscle depletion, and no edema.   Pt appears severely malnourished, thin, encouraged PO intake. Will provide Ensure during stay to supplement PO intake as well.  Labs: CBGs 116-160 Medications reviewed.  Diet Order:  DIET - DYS 1 Room service appropriate?: Yes; Fluid consistency:: Nectar Thick  Skin:  Wound (see comment) (Stg II and Stg I PU)  Last BM:  3/27  Height:   Ht Readings from Last 1 Encounters:  12/03/15 5\' 11"  (1.803 m)    Weight:   Wt Readings from Last 1 Encounters:  12/03/15 145 lb 4.5 oz  (65.9 kg)    Ideal Body Weight:  72.7 kg  BMI:  Body mass index is 20.27 kg/(m^2).  Estimated Nutritional Needs:   Kcal:  1600-1800 calories  Protein:  95-110 grams  Fluid:  1.6-1.8 L/day  EDUCATION NEEDS:   No education needs identified at this time  [name] [name]. Prasad, MS, RD LDN After Hours/Weekend Pager [phone]

## 2015-12-03 NOTE — ED Notes (Addendum)
RN and Dr. Leonides Schanz (EDP) spoke on phone with Inez Catalina, patients sister, regarding patients condition and status; Sister states she is the [facility] and wishes to continue treating patients condition however to keep patient a DNR in the case of decline

## 2015-12-03 NOTE — Progress Notes (Signed)
Upon assessment of patient, RN found patient's buttocks to have skin breakdown-red and excoriated due to MASD. It expands from left to right hip, covering entirety of buttocks. It is blanchable with some scant bleeding around the sacral area. RN cleansed and attempted to place sacral foam but would not stay on patient's skin. Barrier cream applied. Patient also has scattered ecchymosis on his bilateral arms and legs. Pt has large bruises on each side of hips. RN will continue to monitor patient closely.

## 2015-12-03 NOTE — Progress Notes (Signed)
CRITICAL VALUE ALERT  Critical value received:  Lactate 2.7  Date of notification:  12/03/15  Time of notification:  0816  Critical value read back: Yes  Nurse who received alert:  P. Zyrus Hetland, RN  MD notified (1st page):  Nelda Marseille  Time of first page:  08:20  MD notified (2nd page): n/a  Time of second page: n/a  Responding MD:  Nelda Marseille  Time MD responded:  08:20 (in person)

## 2015-12-03 NOTE — Evaluation (Signed)
Clinical/Bedside Swallow Evaluation Patient Details  Name: [name] [name] MRN: [place]:[id number] Date of Birth: 1925-12-30  Today's Date: 12/03/2015 Time: SLP Start Time (ACUTE ONLY): 1010 SLP Stop Time (ACUTE ONLY): 1020 SLP Time Calculation (min) (ACUTE ONLY): 10 min  Past Medical History:  Past Medical History  Diagnosis Date  . CAD (coronary artery disease)   . Hypertension   . Vitamin D deficiency   . Chronic back pain   . Anemia   . ED (erectile dysfunction)   . Arthritis   . RLS (restless legs syndrome)   . GERD (gastroesophageal reflux disease)   . COPD (chronic obstructive pulmonary disease) ([place])   . Hyperlipidemia   . Atrial fibrillation ([place])   . Second degree Mobitz II AV block     s/p PPM  . Asthma 09/15/2011  . Long term current use of anticoagulant 09/15/2011  . Thrombocytopenia ([place]) 09/15/2011  . Lymphocytosis   . CHF (congestive heart failure) ([place]) 01/01/2013  . Allergy    Past Surgical History:  Past Surgical History  Procedure Laterality Date  . Coronary artery bypass graft    . Cataract extraction    . Pacemaker insertion     HPI:  82 y.o. male with a history of dementia who was just recently hospitalized with a prolonged stabilization following flu cardiac arrest on 3/10.  Admitted with seizure from SNF.  Had swallow eval during prior admission after prolonged intubation - regular, thin liquids was recommended at the time.   Assessment / Plan / Recommendation Clinical Impression  Pt presents with mild delays, mild cough post swallow of thin liquids, congestion at level of larynx.  There are concerns for aspiration due primarily to MS changes.  Pt known to this SLP from prior admission, during which swallow was assessed and found to be WNL despite dementia.  Recommend initiating a dysphagia 1 diet with nectar-thick liquids for now; suspect diet advancement as MS returns to baseline.     Aspiration Risk  Mild aspiration risk    Diet Recommendation   dysphagia  1, nectars  Medication Administration: Whole meds with puree    Other  Recommendations Oral Care Recommendations: Oral care BID Other Recommendations: Order thickener from pharmacy   Follow up Recommendations  None    Frequency and Duration min 2x/week  1 week       Prognosis Prognosis for Safe Diet Advancement: Good      Swallow Study   General Date of Onset: 12/02/15 HPI: 82 y.o. male with a history of dementia who was just recently hospitalized with a prolonged stabilization following flu cardiac arrest on 3/10.  Admitted with seizure from SNF.  Type of Study: Bedside Swallow Evaluation Previous Swallow Assessment: 11/20/15 Diet Prior to this Study: NPO Temperature Spikes Noted: No Respiratory Status: Nasal cannula History of Recent Intubation: Yes Length of Intubations (days): 6 days Date extubated: 11/19/15 Behavior/Cognition: Alert;Cooperative;Pleasant mood Oral Cavity Assessment: Within Functional Limits Oral Care Completed by SLP: No Oral Cavity - Dentition: Edentulous (dentures not available) Vision: Functional for self-feeding Self-Feeding Abilities: Able to feed self Patient Positioning: Upright in bed Baseline Vocal Quality: Normal Volitional Cough: Strong Volitional Swallow: Able to elicit    Oral/Motor/Sensory Function Overall Oral Motor/Sensory Function:  (possible mild right facial asymmetry)   Ice Chips Ice chips: Within functional limits   Thin Liquid Thin Liquid: Impaired Presentation: Cup;Self Fed Pharyngeal  Phase Impairments: Throat Clearing - Delayed    Nectar Thick Nectar Thick Liquid: Within functional limits Presentation: Cup  Honey Thick Honey Thick Liquid: Not tested   Puree Puree: Within functional limits   Solid   GO   Solid: Not tested       [name] Bamberg L. Tivis Ringer, MA CCC/SLP Pager [phone]  [name] [name] 12/03/2015,10:26 AM

## 2015-12-04 ENCOUNTER — Inpatient Hospital Stay (HOSPITAL_COMMUNITY): Payer: Medicare Other

## 2015-12-04 DIAGNOSIS — L8990 Pressure ulcer of unspecified site, unspecified stage: Secondary | ICD-10-CM

## 2015-12-04 DIAGNOSIS — I10 Essential (primary) hypertension: Secondary | ICD-10-CM

## 2015-12-04 DIAGNOSIS — E039 Hypothyroidism, unspecified: Secondary | ICD-10-CM

## 2015-12-04 DIAGNOSIS — E43 Unspecified severe protein-calorie malnutrition: Secondary | ICD-10-CM

## 2015-12-04 DIAGNOSIS — I25810 Atherosclerosis of coronary artery bypass graft(s) without angina pectoris: Secondary | ICD-10-CM

## 2015-12-04 DIAGNOSIS — I482 Chronic atrial fibrillation: Secondary | ICD-10-CM

## 2015-12-04 DIAGNOSIS — Z950 Presence of cardiac pacemaker: Secondary | ICD-10-CM

## 2015-12-04 LAB — COMPREHENSIVE METABOLIC PANEL
ALBUMIN: 2 g/dL — AB (ref 3.5–5.0)
ALK PHOS: 113 U/L (ref 38–126)
ALT: 15 U/L — ABNORMAL LOW (ref 17–63)
AST: 20 U/L (ref 15–41)
Anion gap: 9 (ref 5–15)
BILIRUBIN TOTAL: 0.8 mg/dL (ref 0.3–1.2)
BUN: 35 mg/dL — AB (ref 6–20)
CO2: 27 mmol/L (ref 22–32)
Calcium: 8.8 mg/dL — ABNORMAL LOW (ref 8.9–10.3)
Chloride: 110 mmol/L (ref 101–111)
Creatinine, Ser: 1.66 mg/dL — ABNORMAL HIGH (ref 0.61–1.24)
GFR calc Af Amer: 41 mL/min — ABNORMAL LOW (ref >60)
GFR calc non Af Amer: 35 mL/min — ABNORMAL LOW (ref >60)
Glucose, Bld: 147 mg/dL — ABNORMAL HIGH (ref 65–99)
POTASSIUM: 3.3 mmol/L — AB (ref 3.5–5.1)
SODIUM: 146 mmol/L — AB (ref 135–145)
TOTAL PROTEIN: 5.3 g/dL — AB (ref 6.5–8.1)

## 2015-12-04 LAB — TROPONIN I
Troponin I: 0.07 ng/mL — ABNORMAL HIGH (ref <0.031)
Troponin I: 0.1 ng/mL — ABNORMAL HIGH (ref <0.031)

## 2015-12-04 LAB — CBC WITH DIFFERENTIAL/PLATELET
BASOS ABS: 0 10*3/uL (ref 0.0–0.1)
BASOS PCT: 0 %
Eosinophils Absolute: 0 10*3/uL (ref 0.0–0.7)
Eosinophils Relative: 0 %
HEMATOCRIT: 28.6 % — AB (ref 39.0–52.0)
HEMOGLOBIN: 9 g/dL — AB (ref 13.0–17.0)
LYMPHS ABS: 9.4 10*3/uL — AB (ref 0.7–4.0)
LYMPHS PCT: 56 %
MCH: 28.7 pg (ref 26.0–34.0)
MCHC: 31.5 g/dL (ref 30.0–36.0)
MCV: 91.1 fL (ref 78.0–100.0)
MONOS PCT: 5 %
Monocytes Absolute: 0.8 10*3/uL (ref 0.1–1.0)
NEUTROS ABS: 6.5 10*3/uL (ref 1.7–7.7)
Neutrophils Relative %: 39 %
Platelets: 105 10*3/uL — ABNORMAL LOW (ref 150–400)
RBC: 3.14 MIL/uL — ABNORMAL LOW (ref 4.22–5.81)
RDW: 19 % — AB (ref 11.5–15.5)
WBC: 16.7 10*3/uL — ABNORMAL HIGH (ref 4.0–10.5)

## 2015-12-04 LAB — PROTIME-INR
INR: 1.69 — AB (ref 0.00–1.49)
Prothrombin Time: 19.9 seconds — ABNORMAL HIGH (ref 11.6–15.2)

## 2015-12-04 LAB — MAGNESIUM: MAGNESIUM: 2.1 mg/dL (ref 1.7–2.4)

## 2015-12-04 MED ORDER — VANCOMYCIN HCL 10 G IV SOLR
1250.0000 mg | INTRAVENOUS | Status: DC
  Administered 2015-12-04: 1250 mg via INTRAVENOUS
  Filled 2015-12-04: qty 1250

## 2015-12-04 MED ORDER — IPRATROPIUM-ALBUTEROL 0.5-2.5 (3) MG/3ML IN SOLN
3.0000 mL | Freq: Three times a day (TID) | RESPIRATORY_TRACT | Status: DC
  Administered 2015-12-05 – 2015-12-06 (×5): 3 mL via RESPIRATORY_TRACT
  Filled 2015-12-04 (×4): qty 3

## 2015-12-04 NOTE — Discharge Instructions (Signed)

## 2015-12-04 NOTE — Progress Notes (Signed)
Speech Pathology:  MBSS complete. Full report located under chart review in imaging section.  Dung Salinger L. Dietrich Ke, MA CCC/SLP Pager [phone]   

## 2015-12-04 NOTE — Clinical Social Work Note (Signed)
Clinical Social Work Assessment  Patient Details  Name: [name] [name] MRN: [id number] Date of Birth: [birth year]  Date of referral:  12/04/15               Reason for consult:  Facility Placement                Permission sought to share information with:  Family Supports Permission granted to share information::  Yes, Verbal Permission Granted  Name::     [name]  Relationship::  Sister  Contact Information:  [phone]  Housing/Transportation Living arrangements for the past 2 months:  [place] of Information:  Patient, Other (Comment Required) (Patient Sister) Patient Interpreter Needed:  None Criminal Activity/Legal Involvement Pertinent to Current Situation/Hospitalization:  No - Comment as needed Significant Relationships:  Siblings, Adult Children Lives with:  Facility Resident Do you feel safe going back to the place where you live?  Yes Need for family participation in patient care:  Yes (Comment)  Care giving concerns:  Patient sister states that she is just not able to care for him at home and provide adequate support.  Patient sister is overwhelmed due to patient being hospitalized and patient husband also being hospitalized and requiring more care than she can provide.   Social Worker assessment / plan:  Holiday representative met with patient and patient sister at bedside to offer support and discuss patient needs at discharge.  Patient sister states that patient was residing at [facility] in the same room with her husband and they both go readmitted several days apart.  Patient sister feels that it is a good atmosphere and environment for both of them and plans to have them return together back to the same room at discharge.  CSW completed FL2 and notified [place] of patient sister wishes.  CSW remains available for support and to facilitate patient discharge needs once medically stable.  Employment status:   Retired Nurse, adult PT Recommendations:  [place] / Referral to community resources:  [place]  Patient/Family's Response to care:  Patient sister verbalized understanding and appreciation for CSW role and support.  Patient sister is in agreement with patient return to [facility] once medically stable.  Patient/Family's Understanding of and Emotional Response to Diagnosis, Current Treatment, and Prognosis:  Patient family aware of patient continued needs and barriers to patient return home and accepting to return to SNF.  Emotional Assessment Appearance:  Appears stated age Attitude/Demeanor/Rapport:   (Lethargic and Slightly Confused) Affect (typically observed):  Calm, Flat, Stable Orientation:  Oriented to Self, Oriented to Place Alcohol / Substance use:  Not Applicable Psych involvement (Current and /or in the community):  No (Comment)  Discharge Needs  Concerns to be addressed:  Discharge Planning Concerns, Adjustment to Illness Readmission within the last 30 days:  Yes Current discharge risk:  Physical Impairment Barriers to Discharge:  Continued Medical Work up  [employer], [place]

## 2015-12-04 NOTE — NC FL2 (Signed)
[place] LEVEL OF CARE SCREENING TOOL     IDENTIFICATION  Patient Name: [name] [name] Birthdate: 1925/12/05 Sex: male Admission Date (Current Location): 12/02/2015  [facility] and [place] Number:  Herbalist and Address:  [facility], [place] [address] [place], [place] [address]      Provider Number: [id number]  Attending Physician Name and Address:  Lavina Hamman, MD  Relative Name and Phone Number:       Current Level of Care: Hospital Recommended Level of Care: [place] Prior Approval Number:    Date Approved/Denied:   PASRR Number: [id number]  Discharge Plan: SNF    Current Diagnoses: Patient Active Problem List   Diagnosis Date Noted  . Sepsis ([place]) 12/03/2015  . Seizures ([place]) 12/03/2015  . Pleural effusion   . Pleural effusion on right   . Acute on chronic systolic congestive heart failure ([place])   . Bacteremia due to Streptococcus   . Encounter for imaging study to confirm orogastric (OG) tube placement   . Endotracheally intubated   . Pressure ulcer 11/14/2015  . Cardiac arrest ([place])   . Polymorphic ventricular tachycardia ([place])   . Protein-calorie malnutrition, severe ([place]) 11/13/2015  . Acute systolic heart failure ([place]) 11/10/2015  . Sepsis due to pneumonia ([place]) 11/10/2015  . Acute encephalopathy   . Influenza with pneumonia   . Hypotension   . Encounter for therapeutic drug monitoring 10/09/2013  . Rotator cuff tear arthropathy of right shoulder 09/04/2013  . Back pain, acute 06/14/2013  . Right shoulder pain 05/03/2013  . Chronic pain in left shoulder 05/03/2013  . Abnormal CT scan, chest 01/10/2013  . Anticoagulated on Coumadin 01/01/2013  . Left shoulder pain 01/01/2013  . CHF (congestive heart failure) ([place]) 01/01/2013  . Permanent atrial fibrillation ([place]) 11/02/2012  . Lymphocytosis   . Balanitis 09/11/2012  . UTI (lower urinary tract infection) 09/05/2012  . Shortness of  breath 09/05/2012  . Altered mental status 09/05/2012  . Dementia 08/14/2012  . Acute low back pain 07/02/2012  . Cellulitis of right leg 05/28/2012  . Hematoma 05/17/2012  . Leg pain 05/17/2012  . Pacemaker-St.Jude 04/26/2012  . Eczema 03/14/2012  . Right carotid bruit 01/06/2012  . Long term current use of anticoagulant 09/15/2011  . Thrombocytopenia ([place]) 09/15/2011  . Preventative health care 09/10/2011  . CONSTIPATION 06/01/2010  . CLOSED DISLOCATION OF ACROMIOCLAVICULAR 06/01/2010  . Abdominal aortic aneurysm ([place]) 01/28/2010  . COSTOCHONDRITIS 10/22/2009  . Cholesterolosis of gallbladder 07/08/2009  . CHOLELITHIASIS 06/30/2009  . Mobitz type II atrioventricular block 06/17/2009  . ATRIAL FIBRILLATION, PAROXYSMAL 06/17/2009  . Coronary atherosclerosis 01/16/2009  . HYPERTENSION, BENIGN 12/31/2008  . BACK PAIN, CHRONIC 12/31/2008  . Edema 12/31/2008  . Hypothyroidism 11/05/2008  . VITAMIN D DEFICIENCY 11/05/2008  . UNS ADVRS EFF OTH RX MEDICINAL&BIOLOGICAL SBSTNC 11/05/2008  . ERECTILE DYSFUNCTION 09/02/2008  . ARTHRITIS, HIP 09/02/2008  . RESTLESS LEG SYNDROME 12/27/2007  . INSOMNIA 12/27/2007  . HYPERLIPIDEMIA 08/15/2007  . CORONARY ATHEROSCLEROSIS, ARTERY BYPASS GRAFT 08/15/2007  . COPD 08/15/2007  . GERD 08/15/2007  . FREQUENCY, URINARY 08/15/2007  . BPH (benign prostatic hypertrophy) 08/15/2007    Orientation RESPIRATION BLADDER Height & Weight     Self, Place  O2 (3L) External catheter Weight: 145 lb 4.5 oz (65.9 kg) Height:  5\' 11"  (180.3 cm)  BEHAVIORAL SYMPTOMS/MOOD NEUROLOGICAL BOWEL NUTRITION STATUS      Incontinent Diet (Dysphagia 1 Nectar Thick)  AMBULATORY STATUS COMMUNICATION OF NEEDS  Skin   Extensive Assist Verbally PU Stage and Appropriate Care PU Stage 1 Dressing: No Dressing PU Stage 2 Dressing:  (PRN)                   Personal Care Assistance Level of Assistance  Bathing, Feeding, Dressing Bathing Assistance: Maximum  assistance Feeding assistance: Independent Dressing Assistance: Maximum assistance     Functional Limitations Info  Sight, Hearing, Speech Sight Info: Adequate Hearing Info: Adequate Speech Info: Adequate    SPECIAL CARE FACTORS FREQUENCY  PT (By licensed PT), OT (By licensed OT)     PT Frequency: 3 OT Frequency: 3            Contractures Contractures Info: Not present    Additional Factors Info  Code Status, Allergies Code Status Info: DNR Allergies Info: Amoxicillin, Penicillins           Current Medications (12/04/2015):  This is the current hospital active medication list Current Facility-Administered Medications  Medication Dose Route Frequency Provider Last Rate Last Dose  . albuterol (PROVENTIL) (2.5 MG/3ML) 0.083% nebulizer solution 3 mL  3 mL Inhalation Q6H PRN Rise Patience, MD      . amiodarone (PACERONE) tablet 200 mg  200 mg Oral Daily Rise Patience, MD   200 mg at 12/04/15 0949  . atorvastatin (LIPITOR) tablet 10 mg  10 mg Oral Daily Rise Patience, MD   10 mg at 12/04/15 0949  . ceFEPIme (MAXIPIME) 1 g in dextrose 5 % 50 mL IVPB  1 g Intravenous Q24H Wynell Balloon, RPH   1 g at 12/03/15 2246  . donepezil (ARICEPT) tablet 5 mg  5 mg Oral QHS Rise Patience, MD   5 mg at 12/04/15 0013  . feeding supplement (ENSURE ENLIVE) (ENSURE ENLIVE) liquid 237 mL  237 mL Oral BID BM Lavina Hamman, MD   237 mL at 12/04/15 1010  . fluticasone (FLONASE) 50 MCG/ACT nasal spray 1 spray  1 spray Each Nare Daily Rise Patience, MD   1 spray at 12/04/15 [phone]  . guaiFENesin (ROBITUSSIN) 100 MG/5ML solution 200 mg  10 mL Oral TID PC & HS Lavina Hamman, MD   200 mg at 12/04/15 0949  . ipratropium-albuterol (DUONEB) 0.5-2.5 (3) MG/3ML nebulizer solution 3 mL  3 mL Nebulization Q6H Lavina Hamman, MD   3 mL at 12/04/15 0726  . [place]   Oral Once Rise Patience, MD      . Rivaroxaban Alveda Reasons) tablet 15 mg  15 mg Oral Q supper  Lavina Hamman, MD   15 mg at 12/03/15 1610  . tamsulosin (FLOMAX) capsule 0.4 mg  0.4 mg Oral Daily Rise Patience, MD   0.4 mg at 12/04/15 0949  . valproate (DEPACON) 500 mg in dextrose 5 % 50 mL IVPB  500 mg Intravenous Q12H Greta Doom, MD   500 mg at 12/04/15 0949  . vancomycin (VANCOCIN) 1,250 mg in sodium chloride 0.9 % 250 mL IVPB  1,250 mg Intravenous Q24H Lavina Hamman, MD         Discharge Medications: Please see discharge summary for a list of discharge medications.  Relevant Imaging Results:  Relevant Lab Results:   Additional Information SSN [id number]  Barbette Or, [employer]

## 2015-12-04 NOTE — Progress Notes (Signed)
Speech Language Pathology Treatment: Dysphagia  Patient Details Name: [name] [name] MRN: [id number] DOB: 1926-05-29 Today's Date: 12/04/2015 Time: [id number] SLP Time Calculation (min) (ACUTE ONLY): 12 min  Assessment / Plan / Recommendation Clinical Impression  F/u after yesterday's swallow assessment: Pt's lungs c/b wheezes; demonstrated mild, consistent throat clearing after all swallows of nectar liquids, concerning for compromised airway protection.  Recommend proceeding with MBS for definitive study of swallow function.      HPI HPI: 82 y.o. male with a history of dementia who was just recently hospitalized with a prolonged stabilization following flu cardiac arrest on 3/10.  Admitted with seizure from SNF.       SLP Plan  MBS;Continue with current plan of care     Recommendations      MBS today 11:30          Oral Care Recommendations: Oral care BID Plan: MBS;Continue with current plan of care     GO               [name] [name], [place] CCC/SLP Pager [phone]  [name] [name] 12/04/2015, 10:30 AM

## 2015-12-04 NOTE — Progress Notes (Addendum)
[facility] Progress Note  Patient: [name] [name] [id number]   PCP: Odette Fraction, MD DOB: Apr 01, 1926   DOA: 12/02/2015   DOS: 12/04/2015   Date of Service: the patient was seen and examined on 12/04/2015  Subjective: The patient mentions that he continues to have fatigue as well as cough and shortness of breath. Also mentions is getting better. No episodes of seizure overnight documented Nutrition: Was on dysphagia type I diet  Brief hospital course: Patient was admitted on 12/02/2015, with complaint of seizure episode, was found to have sepsis most like is secondary to healthcare associated pneumonia associated with seizures. Also had mild elevation in serum troponin, acute hypoxic respiratory failure. Currently further plan is for continued treatment with broad-spectrum antibiotics and follow the cultures.  Assessment and Plan: 1. Sepsis ([place]) Right basal healthcare associated pneumonia, likely aspiration Blood cultures are negative at present. Check sputum cultures. Leukocytosis improving, hypoxia improving. Mental status is also stable. With this the patient will be transferred to telemetry Continue cefepime, vancomycin, Mucinex and flutter device  2. New onset seizure. Likely metabolic in nature from hypoxia. EEG shows no evidence of epileptogenic focus. Neurology recommends discontinuing Aricept. Continue Dilantin was switched to oral tomorrow.  3. Dysphagia. Modified barium swallow today suggest dysphagia Diet. Continue Speech Therapy  4. Permanent atrial fibrillation, Mobitz type II AV block,  pacemaker implant. Coronary artery disease.  Essential hypertension Continue amiodarone. Continue Xarelto. Rate is well controlled at present.  5. Chronic systolic CHF. Ejection fraction earlier in March 2017 25-30%. Patient was on Lasix which is currently on hold due to sepsis. We will recheck lactic acid tomorrow. Probably Lasix will be restarted tomorrow as  well.  6. Elevated troponin. Neck is from demand ischemia. Trending downwards. We'll continue to close to monitor. EKG nonischemic. No complaints of chest pain from the patient.  7. Chronic kidney disease stage III. Serum creatinine back to baseline. We will monitor closely.  8. Pressure ulcer. Bilateral buttocks unstageable Frequent turns and monitoring, continue barrier cream  9. Severe protein calorie malnutrition. Continue ensure  Activity: physical therapy SNF Bowel regimen: last BM 12/04/2015 DVT Prophylaxis: on therapeutic anticoagulation. Nutrition: Dysphagia type on cardiac diet Advance goals of care discussion: DNR/DNI  HPI: As per the H and P dictated on admission, "[name] [name] is a 82 y.o. male who was recently admitted for acute respiratory failure requiring intubation and has had V. fib arrest was brought to the ER from the nursing home after patient was found to have a seizure and right-sided weakness. CT of the head did not show any acute. 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 Patient has chronic leukocytosis and patient ` yes and was found to be elevated with patient being mildly febrile and chest x-ray showing infiltrate patient is started on IV fluids antibiotics for pneumonia and admitted for further management. Patient on my exam is confused and oriented to his name only. Unable to move his right extremities. He had physician as discussed with patient's sister who has confirmed DO NOT RESUSCITATE status. " Procedures: none Consultants: none Antibiotics: Anti-infectives    Start     Dose/Rate Route Frequency Ordered Stop   12/04/15 2300  vancomycin (VANCOCIN) 1,250 mg in sodium chloride 0.9 % 250 mL IVPB     1,250 mg 166.7 mL/hr over 90 Minutes Intravenous Every 24 hours 12/04/15 1042     12/04/15 0200  vancomycin (  VANCOCIN) IVPB 750 mg/150 ml  premix  Status:  Discontinued     750 mg 150 mL/hr over 60 Minutes Intravenous Every 24 hours 12/03/15 0434 12/04/15 1042   12/03/15 2200  ceFEPIme (MAXIPIME) 1 g in dextrose 5 % 50 mL IVPB     1 g 100 mL/hr over 30 Minutes Intravenous Every 24 hours 12/03/15 1115     12/03/15 0600  aztreonam (AZACTAM) 500 mg in dextrose 5 % 50 mL IVPB  Status:  Discontinued     500 mg 100 mL/hr over 30 Minutes Intravenous 3 times per day 12/03/15 0434 12/03/15 1100   12/03/15 0430  aztreonam (AZACTAM) 2 g in dextrose 5 % 50 mL IVPB  Status:  Discontinued     2 g 100 mL/hr over 30 Minutes Intravenous  Once 12/03/15 0426 12/03/15 0427   12/03/15 0430  vancomycin (VANCOCIN) IVPB 1000 mg/200 mL premix  Status:  Discontinued     1,000 mg 200 mL/hr over 60 Minutes Intravenous  Once 12/03/15 0426 12/03/15 0427   12/03/15 0145  vancomycin (VANCOCIN) IVPB 1000 mg/200 mL premix     1,000 mg 200 mL/hr over 60 Minutes Intravenous  Once 12/03/15 0135 12/03/15 0313   12/03/15 0145  ceFEPIme (MAXIPIME) 2 g in dextrose 5 % 50 mL IVPB     2 g 100 mL/hr over 30 Minutes Intravenous  Once 12/03/15 0139 12/03/15 0240       Family Communication: no family was present at bedside, at the time of interview.   Disposition:  Expected discharge date: 12/07/2015 Barriers to safe discharge: Improvement in oxygenation   Intake/Output Summary (Last 24 hours) at 12/04/15 1611 Last data filed at 12/04/15 1400  Gross per 24 hour  Intake    425 ml  Output    500 ml  Net    -75 ml   Filed Weights   12/02/15 2357 12/03/15 0400  Weight: 72 kg (158 lb 11.7 oz) 65.9 kg (145 lb 4.5 oz)    Objective: Physical Exam: Filed Vitals:   12/04/15 1200 12/04/15 1300 12/04/15 1357 12/04/15 1400  BP: 126/53 153/48  131/49  Pulse: 81 60 68 69  Temp:  98.6 F (37 C)    TempSrc:  Oral    Resp: 26 27 23 19   Height:      Weight:      SpO2: 97% 100% 96% 100%     General: Appear in moderate distress, no Rash; Oral Mucosa  moist. Cardiovascular: S1 and S2 Present, no Murmur, no JVD Respiratory: Bilateral Air entry present and basal Crackles, no wheezes Abdomen: Bowel Sound present, Soft and no tenderness Extremities: no Pedal edema, no calf tenderness Neurology: Grossly no focal neuro deficit.  Data Reviewed: CBC:  Recent Labs Lab 11/29/15 0439 11/30/15 0419 12/01/15 0257 12/02/15 2340 12/02/15 2352 12/03/15 0515 12/04/15 0930  WBC 15.6* 18.6* 19.6* 21.9*  --  20.0* 16.7*  NEUTROABS 3.1  --   --  10.5*  --  8.2* 6.5  HGB 8.4* 9.5* 9.6* 11.3* 13.6 8.9* 9.0*  HCT 27.3* 32.4* 32.1* 36.1* 40.0 28.7* 28.6*  MCV 91.3 91.3 93.0 91.6  --  92.6 91.1  PLT 113* 127* 125* 134*  --  108* 123456*   Basic Metabolic Panel:  Recent Labs Lab 11/30/15 0419 12/01/15 0257 12/02/15 2340 12/02/15 2352 12/03/15 0515 12/04/15 0930  NA 143 143 145 144 145 146*  K 3.9 4.3 3.8 3.8 3.8 3.3*  CL 101 101 102 102 107 110  CO2  33* 34* 28  --  28 27  GLUCOSE 140* 129* 156* 150* 177* 147*  BUN 26* 29* 49* 53* 46* 35*  CREATININE 1.90* 2.09* 2.56* 2.30* 2.36* 1.66*  CALCIUM 8.9 8.8* 9.2  --  8.2* 8.8*  MG  --   --   --   --   --  2.1   Liver Function Tests:  Recent Labs Lab 12/02/15 2340 12/03/15 0515 12/04/15 0930  AST 30 21 20   ALT 16* 13* 15*  ALKPHOS 122 100 113  BILITOT 1.0 0.8 0.8  PROT 5.8* 5.2* 5.3*  ALBUMIN 2.4* 2.0* 2.0*   No results for input(s): LIPASE, AMYLASE in the last 168 hours. No results for input(s): AMMONIA in the last 168 hours.  Cardiac Enzymes:  Recent Labs Lab 12/03/15 0948 12/03/15 1545 12/03/15 1900 12/04/15 0045 12/04/15 0930  TROPONINI 0.14* 0.16* 0.08* 0.07* 0.10*    BNP (last 3 results)  Recent Labs  11/10/15 1456 11/16/15 0353 12/02/15 2345  BNP 1792.8* 406.2* 488.1*    CBG:  Recent Labs Lab 12/01/15 0655 12/01/15 1259 12/01/15 1643 12/01/15 2248 12/03/15 0346  GLUCAP 123* 127* 116* 133* 160*    Recent Results (from the past 240 hour(s))   Culture, body fluid-bottle     Status: None   Collection Time: 11/27/15  3:40 PM  Result Value Ref Range Status   Specimen Description FLUID RIGHT PLEURAL  Final   Special Requests BOTTLES DRAWN AEROBIC AND ANAEROBIC 10CC  Final   Culture NO GROWTH 5 DAYS  Final   Report Status 12/02/2015 FINAL  Final  Gram stain     Status: None   Collection Time: 11/27/15  3:40 PM  Result Value Ref Range Status   Specimen Description FLUID RIGHT PLEURAL  Final   Special Requests NONE  Final   Gram Stain   Final    MODERATE WBC PRESENT,BOTH PMN AND MONONUCLEAR NO ORGANISMS SEEN    Report Status 11/27/2015 FINAL  Final  Blood culture (routine x 2)     Status: None (Preliminary result)   Collection Time: 12/03/15 12:15 AM  Result Value Ref Range Status   Specimen Description BLOOD RIGHT ARM  Final   Special Requests BOTTLES DRAWN AEROBIC AND ANAEROBIC 5ML  Final   Culture NO GROWTH 1 DAY  Final   Report Status PENDING  Incomplete  Blood culture (routine x 2)     Status: None (Preliminary result)   Collection Time: 12/03/15 12:45 AM  Result Value Ref Range Status   Specimen Description BLOOD RIGHT FOREARM  Final   Special Requests BOTTLES DRAWN AEROBIC AND ANAEROBIC 5ML  Final   Culture NO GROWTH 1 DAY  Final   Report Status PENDING  Incomplete     Studies: Dg Swallowing Func-speech Pathology  12/04/2015    Objective Swallowing Evaluation: Type of Study: MBS-Modified Barium Swallow Study Patient Details Name: HIROTO KEMNA MRN: [id number] Date of Birth: 1926/05/14 Today's Date: 12/04/2015 Time: SLP Start Time (ACUTE ONLY): 1140-SLP Stop Time (ACUTE ONLY): 1210 SLP Time Calculation (min) (ACUTE ONLY): 30 min Past Medical History: Past Medical History Diagnosis Date . CAD (coronary artery disease)  . Hypertension  . Vitamin D deficiency  . Chronic back pain  . Anemia  . ED (erectile dysfunction)  . Arthritis  . RLS (restless legs syndrome)  . GERD (gastroesophageal reflux disease)  . COPD (chronic  obstructive pulmonary disease) ([place])  . Hyperlipidemia  . Atrial fibrillation ([place])  . Second degree Mobitz II AV block  s/p PPM . Asthma 09/15/2011 . Long term current use of anticoagulant 09/15/2011 . Thrombocytopenia ([place]) 09/15/2011 . Lymphocytosis  . CHF (congestive heart failure) ([place]) 01/01/2013 . Allergy  Past Surgical History: Past Surgical History Procedure Laterality Date . Coronary artery bypass graft   . Cataract extraction   . Pacemaker insertion   HPI: 82 y.o. male with a history of dementia who was just recently hospitalized with a prolonged stabilization following flu cardiac arrest on 3/10.  Admitted with seizure from SNF.  Subjective: pleasant Assessment / Plan / Recommendation CHL IP CLINICAL IMPRESSIONS 12/04/2015 Therapy Diagnosis Mild pharyngeal phase dysphagia;Suspected primary esophageal dysphagia Clinical Impression Pt presents with a primary esophageal dysphagia with a mild pharyngeal component.  There is adequate clearance of all POs through the pharynx, and good laryngeal closure for all materials except thin liquids, which were aspirated.  More notable was the poor clearance of POs through the esophagus - scan revealed retention of all consistencies throughout esophageal column.  After completion of study, pt coughed and regurgitated barium.  Concern is primarily for aspiration of esophageal backflow. Recommend continuing Dysphagia 1 diet with nectar-thick liquids.  Results and video of exam shown to pt's sister, his POA.  Sister will talk with MD re: aggressiveness of care and whether pursuing  further esophageal w/u is in pt's best interest.   Impact on safety and function Moderate aspiration risk   CHL IP TREATMENT RECOMMENDATION 12/04/2015 Treatment Recommendations Therapy as outlined in treatment plan below   Prognosis 12/03/2015 Prognosis for Safe Diet Advancement Good Barriers to Reach Goals -- Barriers/Prognosis Comment -- CHL IP DIET RECOMMENDATION 12/04/2015 SLP Diet Recommendations  Dysphagia 1 (Puree) solids;Nectar thick liquid Liquid Administration via Cup Medication Administration Whole meds with puree Compensations Small sips/bites Postural Changes Remain semi-upright after after feeds/meals (Comment);Seated upright at 90 degrees   CHL IP OTHER RECOMMENDATIONS 12/04/2015 Recommended Consults -- Oral Care Recommendations Oral care BID Other Recommendations --   CHL IP FOLLOW UP RECOMMENDATIONS 12/03/2015 Follow up Recommendations None   CHL IP FREQUENCY AND DURATION 12/04/2015 Speech Therapy Frequency (ACUTE ONLY) min 2x/week Treatment Duration --      CHL IP ORAL PHASE 12/04/2015 Oral Phase WFL Oral - Pudding Teaspoon -- Oral - Pudding Cup -- Oral - Honey Teaspoon -- Oral - Honey Cup -- Oral - Nectar Teaspoon -- Oral - Nectar Cup -- Oral - Nectar Straw -- Oral - Thin Teaspoon -- Oral - Thin Cup -- Oral - Thin Straw -- Oral - Puree -- Oral - Mech Soft -- Oral - Regular -- Oral - Multi-Consistency -- Oral - Pill -- Oral Phase - Comment --  CHL IP PHARYNGEAL PHASE 12/04/2015 Pharyngeal Phase Impaired Pharyngeal- Pudding Teaspoon -- Pharyngeal -- Pharyngeal- Pudding Cup -- Pharyngeal -- Pharyngeal- Honey Teaspoon -- Pharyngeal -- Pharyngeal- Honey Cup -- Pharyngeal -- Pharyngeal- Nectar Teaspoon -- Pharyngeal -- Pharyngeal- Nectar Cup Delayed swallow initiation-vallecula Pharyngeal -- Pharyngeal- Nectar Straw -- Pharyngeal -- Pharyngeal- Thin Teaspoon -- Pharyngeal -- Pharyngeal- Thin Cup Delayed swallow initiation-vallecula;Penetration/Aspiration during swallow Pharyngeal Material enters airway, passes BELOW cords and not ejected out despite cough attempt by patient Pharyngeal- Thin Straw -- Pharyngeal -- Pharyngeal- Puree Delayed swallow initiation-vallecula Pharyngeal -- Pharyngeal- Mechanical Soft -- Pharyngeal -- Pharyngeal- Regular -- Pharyngeal -- Pharyngeal- Multi-consistency -- Pharyngeal -- Pharyngeal- Pill -- Pharyngeal -- Pharyngeal Comment --  CHL IP CERVICAL ESOPHAGEAL PHASE 12/04/2015  Cervical Esophageal Phase Impaired Pudding Teaspoon -- Pudding Cup -- Honey Teaspoon -- Honey Cup -- Nectar Teaspoon -- Nectar Cup --  Nectar Straw -- Thin Teaspoon -- Thin Cup -- Thin Straw -- Puree -- Mechanical Soft -- Regular -- Multi-consistency -- Pill -- Cervical Esophageal Comment barium retention throughout esophagus No flowsheet data found. [name] 12/04/2015, 1:42 PM                Scheduled Meds: . amiodarone  200 mg Oral Daily  . atorvastatin  10 mg Oral Daily  . ceFEPime (MAXIPIME) IV  1 g Intravenous Q24H  . feeding supplement (ENSURE ENLIVE)  237 mL Oral BID BM  . fluticasone  1 spray Each Nare Daily  . guaiFENesin  10 mL Oral TID PC & HS  . ipratropium-albuterol  3 mL Nebulization Q6H  . RESOURCE THICKENUP CLEAR   Oral Once  . rivaroxaban  15 mg Oral Q supper  . tamsulosin  0.4 mg Oral Daily  . valproate sodium  500 mg Intravenous Q12H  . vancomycin  1,250 mg Intravenous Q24H   Continuous Infusions:  PRN Meds: albuterol  Time spent: 30 minutes  Author: Berle Mull, MD [facility] Pager: [phone] 12/04/2015 4:11 PM  If 7PM-7AM, please contact night-coverage at [email or web address], password [facility]

## 2015-12-04 NOTE — Progress Notes (Signed)
Pharmacy Antibiotic Note [name] [name] is a 82 y.o. male admitted on 12/02/2015 that is currently on day 2 of Cefepime and vancomycin for coverage of sepsis. SCr improved 2.36 > 1.66  Plan: 1. With improvement in renal function will empirically adjust vancomycin dose to 1250 mg Q24H (based on previous dosing and levels) 2. Continue Cefepime 1 gram Q24 hours 3. Await pending micro data and narrow abx as feasible   Height: 5\' 11"  (180.3 cm) Weight: 145 lb 4.5 oz (65.9 kg) IBW/kg (Calculated) : 75.3  Temp (24hrs), Avg:97.8 F (36.6 C), Min:97.2 F (36.2 C), Max:98.5 F (36.9 C)   Recent Labs Lab 11/30/15 0419 12/01/15 0257 12/02/15 2340 12/02/15 2352 12/03/15 0051 12/03/15 0253 12/03/15 0515 12/03/15 0722 12/04/15 0930  WBC 18.6* 19.6* 21.9*  --   --   --  20.0*  --  16.7*  CREATININE 1.90* 2.09* 2.56* 2.30*  --   --  2.36*  --  1.66*  LATICACIDVEN  --   --   --   --  3.38* 3.61* 3.0* 2.7*  --     Estimated Creatinine Clearance: 28.7 mL/min (by C-G formula based on Cr of 1.66).    Allergies  Allergen Reactions  . Amoxicillin Itching and Rash  . Penicillins Itching and Rash    Has patient had a PCN reaction causing immediate rash, facial/tongue/throat swelling, SOB or lightheadedness with hypotension: Yes Has patient had a PCN reaction causing severe rash involving mucus membranes or skin necrosis: No Has patient had a PCN reaction that required hospitalization No Has patient had a PCN reaction occurring within the last 10 years: No If all of the above answers are "NO", then may proceed with Cephalosporin use.     Antibiotics: 3/30 Cefepime >>  3/30 Vancomycin >>   Culture data: 3/30 BCx: ngtd   Vincenza Hews, PharmD, BCPS 12/04/2015, 10:44 AM Pager: [phone]

## 2015-12-04 NOTE — Progress Notes (Signed)
Notified sister Inez [place], [place]) of pt's tx to [phone]

## 2015-12-05 LAB — COMPREHENSIVE METABOLIC PANEL
ALT: 17 U/L (ref 17–63)
ANION GAP: 9 (ref 5–15)
AST: 24 U/L (ref 15–41)
Albumin: 1.9 g/dL — ABNORMAL LOW (ref 3.5–5.0)
Alkaline Phosphatase: 116 U/L (ref 38–126)
BUN: 29 mg/dL — ABNORMAL HIGH (ref 6–20)
CHLORIDE: 110 mmol/L (ref 101–111)
CO2: 28 mmol/L (ref 22–32)
Calcium: 8.9 mg/dL (ref 8.9–10.3)
Creatinine, Ser: 1.45 mg/dL — ABNORMAL HIGH (ref 0.61–1.24)
GFR, EST AFRICAN AMERICAN: 48 mL/min — AB (ref >60)
GFR, EST NON AFRICAN AMERICAN: 41 mL/min — AB (ref >60)
Glucose, Bld: 128 mg/dL — ABNORMAL HIGH (ref 65–99)
POTASSIUM: 3.9 mmol/L (ref 3.5–5.1)
Sodium: 147 mmol/L — ABNORMAL HIGH (ref 135–145)
Total Bilirubin: 0.9 mg/dL (ref 0.3–1.2)
Total Protein: 5.2 g/dL — ABNORMAL LOW (ref 6.5–8.1)

## 2015-12-05 LAB — CBC WITH DIFFERENTIAL/PLATELET
BASOS PCT: 0 %
Basophils Absolute: 0 10*3/uL (ref 0.0–0.1)
EOS PCT: 1 %
Eosinophils Absolute: 0.2 10*3/uL (ref 0.0–0.7)
HCT: 31.8 % — ABNORMAL LOW (ref 39.0–52.0)
Hemoglobin: 9.9 g/dL — ABNORMAL LOW (ref 13.0–17.0)
LYMPHS ABS: 8.7 10*3/uL — AB (ref 0.7–4.0)
Lymphocytes Relative: 58 %
MCH: 28.4 pg (ref 26.0–34.0)
MCHC: 31.1 g/dL (ref 30.0–36.0)
MCV: 91.1 fL (ref 78.0–100.0)
MONO ABS: 1.5 10*3/uL — AB (ref 0.1–1.0)
Monocytes Relative: 10 %
Neutro Abs: 4.7 10*3/uL (ref 1.7–7.7)
Neutrophils Relative %: 31 %
PLATELETS: 94 10*3/uL — AB (ref 150–400)
RBC: 3.49 MIL/uL — ABNORMAL LOW (ref 4.22–5.81)
RDW: 19.3 % — AB (ref 11.5–15.5)
WBC: 15.1 10*3/uL — AB (ref 4.0–10.5)

## 2015-12-05 LAB — GLUCOSE, CAPILLARY: GLUCOSE-CAPILLARY: 307 mg/dL — AB (ref 65–99)

## 2015-12-05 LAB — LACTIC ACID, PLASMA: LACTIC ACID, VENOUS: 3.6 mmol/L — AB (ref 0.5–2.0)

## 2015-12-05 MED ORDER — DIVALPROEX SODIUM 500 MG PO DR TAB
500.0000 mg | DELAYED_RELEASE_TABLET | Freq: Two times a day (BID) | ORAL | Status: DC
  Administered 2015-12-05 – 2015-12-07 (×5): 500 mg via ORAL
  Filled 2015-12-05 (×5): qty 1

## 2015-12-05 MED ORDER — FUROSEMIDE 20 MG PO TABS: 20.0000 mg | ORAL_TABLET | Freq: Every day | ORAL | Status: DC

## 2015-12-05 MED ORDER — METHYLPREDNISOLONE SODIUM SUCC 125 MG IJ SOLR
60.0000 mg | Freq: Two times a day (BID) | INTRAMUSCULAR | Status: DC
  Administered 2015-12-05 – 2015-12-07 (×5): 60 mg via INTRAVENOUS
  Filled 2015-12-05 (×5): qty 2

## 2015-12-05 MED ORDER — SODIUM CHLORIDE 0.9 % IV BOLUS (SEPSIS)
500.0000 mL | Freq: Once | INTRAVENOUS | Status: AC
  Administered 2015-12-05: 500 mL via INTRAVENOUS

## 2015-12-05 NOTE — Progress Notes (Signed)
Renique tech witness to conversation at desk outside of room 10.  Pt sister requesting pt be given "regular water". rn explained that pt was not allowed to have regular water, sister said that pt will not drink the water with the thickener in it. rn explained that thickened water was the doctors order. Sister said that she was told to give him whatever he wants "because he was not expected to last/ be here". rn explained that she had to give thickened water because of the patients risk for aspiration, and that aspiration could cause pneumonia and be deadly. Sister said "well then I guess he will just die from thirst" and walked off.

## 2015-12-05 NOTE — Progress Notes (Signed)
Condom cath has fallen off pt. Will leave off for the moment, pt states he is hot, has kicked all covers off. rn encouraged pt to drink more of the thickened vanilla ensure.

## 2015-12-05 NOTE — Progress Notes (Signed)
Pt refused to eat any of his meal, did eat the ice cream. Does like vanilla ensure. Pt has drank 3 thickened vanilla ensures today

## 2015-12-05 NOTE — Progress Notes (Signed)
[facility] Progress Note  Patient: [name] [name] [id number]   PCP: Odette Fraction, MD DOB: 1926-08-11   DOA: 12/02/2015   DOS: 12/05/2015   Date of Service: the patient was seen and examined on 12/05/2015  Subjective: Patient feels somewhat better although the cough and shortness of breath is about the same. No chest pain no abdominal pain. No nausea no vomiting. Nutrition: Tolerating diet  Brief hospital course: Patient was admitted on 12/02/2015, with complaint of seizure episode, was found to have sepsis most like is secondary to healthcare associated pneumonia associated with seizures. Also had mild elevation in serum troponin, acute hypoxic respiratory failure. Currently further plan is for continued treatment with broad-spectrum antibiotics and follow the cultures.  Assessment and Plan: 1. Sepsis ([place])  Acute hypoxic respiratory failure. COPD exacerbation. Right basal healthcare associated pneumonia, likely aspiration Blood cultures are negative at present. Check sputum cultures. Leukocytosis improving, hypoxia improving. Mental status is also stable. Continue cefepime, Mucinex and flutter device stop vancomycin since recent MRSA PCR in earlier admission was negative and there is high suspicion of aspiration pneumonia.  With bilateral expiratory wheezes and history of COPD I would also add steroids  2. New onset seizure. Likely metabolic in nature from hypoxia. EEG shows no evidence of epileptogenic focus. Neurology recommends discontinuing Aricept. Continue Dilantin was switched to oral  3. Dysphagia. Modified barium swallow today suggest dysphagia Diet. Continue Speech Therapy  4. Permanent atrial fibrillation, Mobitz type II AV block,  pacemaker implant. Coronary artery disease.  Essential hypertension Continue amiodarone. Continue Xarelto. Rate is well controlled at present.  5. Chronic systolic CHF. Ejection fraction earlier in March 2017  25-30%. Patient was on Lasix which is currently on hold due to sepsis. Restart Lasix on 12/06/2015   6. Elevated troponin. demand ischemia. Trending downwards. We'll continue to close to monitor. EKG nonischemic. No complaints of chest pain from the patient.  7. Chronic kidney disease stage III. Serum creatinine back to baseline. We will monitor closely.  8. Pressure ulcer. Bilateral buttocks unstageable Frequent turns and monitoring, continue barrier cream  9. Severe protein calorie malnutrition. Continue ensure  Activity: physical therapy SNF Bowel regimen: last BM 12/04/2015 DVT Prophylaxis: on therapeutic anticoagulation. Nutrition: Dysphagia type 1 cardiac diet Advance goals of care discussion: DNR/DNI  HPI: As per the H and P dictated on admission, "ALYIS CORT is a 82 y.o. male who was recently admitted for acute respiratory failure requiring intubation and has had V. fib arrest was brought to the ER from the nursing home after patient was found to have a seizure and right-sided weakness. CT of the head did not show any acute. 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 Patient has chronic leukocytosis and patient ` yes and was found to be elevated with patient being mildly febrile and chest x-ray showing infiltrate patient is started on IV fluids antibiotics for pneumonia and admitted for further management. Patient on my exam is confused and oriented to his name only. Unable to move his right extremities. He had physician as discussed with patient's sister who has confirmed DO NOT RESUSCITATE status. " Procedures: none Consultants: Neurology Antibiotics: Anti-infectives    Start     Dose/Rate Route Frequency Ordered Stop   12/04/15 2300  vancomycin (VANCOCIN) 1,250 mg in sodium chloride 0.9 % 250 mL IVPB  Status:  Discontinued     1,250 mg 166.7 mL/hr over 90  Minutes Intravenous Every 24 hours 12/04/15 1042 12/05/15 0802   12/04/15 0200  vancomycin (VANCOCIN) IVPB 750 mg/150 ml premix  Status:  Discontinued     750 mg 150 mL/hr over 60 Minutes Intravenous Every 24 hours 12/03/15 0434 12/04/15 1042   12/03/15 2200  ceFEPIme (MAXIPIME) 1 g in dextrose 5 % 50 mL IVPB     1 g 100 mL/hr over 30 Minutes Intravenous Every 24 hours 12/03/15 1115     12/03/15 0600  aztreonam (AZACTAM) 500 mg in dextrose 5 % 50 mL IVPB  Status:  Discontinued     500 mg 100 mL/hr over 30 Minutes Intravenous 3 times per day 12/03/15 0434 12/03/15 1100   12/03/15 0430  aztreonam (AZACTAM) 2 g in dextrose 5 % 50 mL IVPB  Status:  Discontinued     2 g 100 mL/hr over 30 Minutes Intravenous  Once 12/03/15 0426 12/03/15 0427   12/03/15 0430  vancomycin (VANCOCIN) IVPB 1000 mg/200 mL premix  Status:  Discontinued     1,000 mg 200 mL/hr over 60 Minutes Intravenous  Once 12/03/15 0426 12/03/15 0427   12/03/15 0145  vancomycin (VANCOCIN) IVPB 1000 mg/200 mL premix     1,000 mg 200 mL/hr over 60 Minutes Intravenous  Once 12/03/15 0135 12/03/15 0313   12/03/15 0145  ceFEPIme (MAXIPIME) 2 g in dextrose 5 % 50 mL IVPB     2 g 100 mL/hr over 30 Minutes Intravenous  Once 12/03/15 0139 12/03/15 0240      Family Communication: no family was present at bedside, at the time of interview.   Disposition:  Expected discharge date: 12/07/2015 Barriers to safe discharge: Improvement in oxygenation   Intake/Output Summary (Last 24 hours) at 12/05/15 1715 Last data filed at 12/05/15 0603  Gross per 24 hour  Intake    240 ml  Output    650 ml  Net   -410 ml   Filed Weights   12/02/15 2357 12/03/15 0400  Weight: 72 kg (158 lb 11.7 oz) 65.9 kg (145 lb 4.5 oz)    Objective: Physical Exam: Filed Vitals:   12/05/15 0754 12/05/15 0912 12/05/15 1436 12/05/15 1533  BP:  135/41 152/48   Pulse:  76 77   Temp:  97.6 F (36.4 C) 97.5 F (36.4 C)   TempSrc:  Oral Oral   Resp:  16 16    Height:      Weight:      SpO2: 91% 95% 94% 94%    General: Appear in moderate distress, no Rash; Oral Mucosa moist. Cardiovascular: S1 and S2 Present, no Murmur, no JVD Respiratory: Bilateral Air entry present and basal Crackles, bilateral expiratory wheezes Abdomen: Bowel Sound present, Soft and no tenderness Extremities: no Pedal edema, no calf tenderness Neurology: Grossly no focal neuro deficit.  Data Reviewed: CBC:  Recent Labs Lab 11/29/15 0439  12/01/15 0257 12/02/15 2340 12/02/15 2352 12/03/15 0515 12/04/15 0930 12/05/15 0359  WBC 15.6*  < > 19.6* 21.9*  --  20.0* 16.7* 15.1*  NEUTROABS 3.1  --   --  10.5*  --  8.2* 6.5 4.7  HGB 8.4*  < > 9.6* 11.3* 13.6 8.9* 9.0* 9.9*  HCT 27.3*  < > 32.1* 36.1* 40.0 28.7* 28.6* 31.8*  MCV 91.3  < > 93.0 91.6  --  92.6 91.1 91.1  PLT 113*  < > 125* 134*  --  108* 105* 94*  < > = values in this interval not displayed. Basic Metabolic Panel:  Recent  Labs Lab 12/01/15 0257 12/02/15 2340 12/02/15 2352 12/03/15 0515 12/04/15 0930 12/05/15 0359  NA 143 145 144 145 146* 147*  K 4.3 3.8 3.8 3.8 3.3* 3.9  CL 101 102 102 107 110 110  CO2 34* 28  --  28 27 28   GLUCOSE 129* 156* 150* 177* 147* 128*  BUN 29* 49* 53* 46* 35* 29*  CREATININE 2.09* 2.56* 2.30* 2.36* 1.66* 1.45*  CALCIUM 8.8* 9.2  --  8.2* 8.8* 8.9  MG  --   --   --   --  2.1  --    Liver Function Tests:  Recent Labs Lab 12/02/15 2340 12/03/15 0515 12/04/15 0930 12/05/15 0359  AST 30 21 20 24   ALT 16* 13* 15* 17  ALKPHOS 122 100 113 116  BILITOT 1.0 0.8 0.8 0.9  PROT 5.8* 5.2* 5.3* 5.2*  ALBUMIN 2.4* 2.0* 2.0* 1.9*   No results for input(s): LIPASE, AMYLASE in the last 168 hours. No results for input(s): AMMONIA in the last 168 hours.  Cardiac Enzymes:  Recent Labs Lab 12/03/15 0948 12/03/15 1545 12/03/15 1900 12/04/15 0045 12/04/15 0930  TROPONINI 0.14* 0.16* 0.08* 0.07* 0.10*    BNP (last 3 results)  Recent Labs  11/10/15 1456  11/16/15 0353 12/02/15 2345  BNP 1792.8* 406.2* 488.1*    CBG:  Recent Labs Lab 12/01/15 0655 12/01/15 1259 12/01/15 1643 12/01/15 2248 12/03/15 0346  GLUCAP 123* 127* 116* 133* 160*    Recent Results (from the past 240 hour(s))  Culture, body fluid-bottle     Status: None   Collection Time: 11/27/15  3:40 PM  Result Value Ref Range Status   Specimen Description FLUID RIGHT PLEURAL  Final   Special Requests BOTTLES DRAWN AEROBIC AND ANAEROBIC 10CC  Final   Culture NO GROWTH 5 DAYS  Final   Report Status 12/02/2015 FINAL  Final  Gram stain     Status: None   Collection Time: 11/27/15  3:40 PM  Result Value Ref Range Status   Specimen Description FLUID RIGHT PLEURAL  Final   Special Requests NONE  Final   Gram Stain   Final    MODERATE WBC PRESENT,BOTH PMN AND MONONUCLEAR NO ORGANISMS SEEN    Report Status 11/27/2015 FINAL  Final  Blood culture (routine x 2)     Status: None (Preliminary result)   Collection Time: 12/03/15 12:15 AM  Result Value Ref Range Status   Specimen Description BLOOD RIGHT ARM  Final   Special Requests BOTTLES DRAWN AEROBIC AND ANAEROBIC 5ML  Final   Culture NO GROWTH 2 DAYS  Final   Report Status PENDING  Incomplete  Blood culture (routine x 2)     Status: None (Preliminary result)   Collection Time: 12/03/15 12:45 AM  Result Value Ref Range Status   Specimen Description BLOOD RIGHT FOREARM  Final   Special Requests BOTTLES DRAWN AEROBIC AND ANAEROBIC 5ML  Final   Culture NO GROWTH 2 DAYS  Final   Report Status PENDING  Incomplete     Studies: No results found.   Scheduled Meds: . amiodarone  200 mg Oral Daily  . atorvastatin  10 mg Oral Daily  . ceFEPime (MAXIPIME) IV  1 g Intravenous Q24H  . divalproex  500 mg Oral Q12H  . feeding supplement (ENSURE ENLIVE)  237 mL Oral BID BM  . fluticasone  1 spray Each Nare Daily  . guaiFENesin  10 mL Oral TID PC & HS  . ipratropium-albuterol  3 mL Nebulization TID  .  methylPREDNISolone  (SOLU-MEDROL) injection  60 mg Intravenous Q12H  . RESOURCE THICKENUP CLEAR   Oral Once  . rivaroxaban  15 mg Oral Q supper  . tamsulosin  0.4 mg Oral Daily   Continuous Infusions:  PRN Meds: albuterol  Time spent: 30 minutes  Author: Berle Mull, MD [facility] Pager: [phone] 12/05/2015 5:15 PM  If 7PM-7AM, please contact night-coverage at [email or web address], password [facility]

## 2015-12-06 DIAGNOSIS — I441 Atrioventricular block, second degree: Secondary | ICD-10-CM

## 2015-12-06 LAB — CBC
HCT: 33.1 % — ABNORMAL LOW (ref 39.0–52.0)
HEMOGLOBIN: 10.3 g/dL — AB (ref 13.0–17.0)
MCH: 28.7 pg (ref 26.0–34.0)
MCHC: 31.1 g/dL (ref 30.0–36.0)
MCV: 92.2 fL (ref 78.0–100.0)
PLATELETS: 109 10*3/uL — AB (ref 150–400)
RBC: 3.59 MIL/uL — ABNORMAL LOW (ref 4.22–5.81)
RDW: 19.1 % — ABNORMAL HIGH (ref 11.5–15.5)
WBC: 12.4 10*3/uL — ABNORMAL HIGH (ref 4.0–10.5)

## 2015-12-06 LAB — GLUCOSE, CAPILLARY
GLUCOSE-CAPILLARY: 177 mg/dL — AB (ref 65–99)
GLUCOSE-CAPILLARY: 314 mg/dL — AB (ref 65–99)

## 2015-12-06 LAB — TROPONIN I
Troponin I: 0.04 ng/mL — ABNORMAL HIGH (ref <0.031)
Troponin I: 0.04 ng/mL — ABNORMAL HIGH (ref <0.031)
Troponin I: 0.04 ng/mL — ABNORMAL HIGH (ref <0.031)

## 2015-12-06 LAB — BASIC METABOLIC PANEL
ANION GAP: 14 (ref 5–15)
BUN: 28 mg/dL — ABNORMAL HIGH (ref 6–20)
CO2: 29 mmol/L (ref 22–32)
Calcium: 9.5 mg/dL (ref 8.9–10.3)
Chloride: 106 mmol/L (ref 101–111)
Creatinine, Ser: 1.4 mg/dL — ABNORMAL HIGH (ref 0.61–1.24)
GFR calc Af Amer: 50 mL/min — ABNORMAL LOW (ref >60)
GFR, EST NON AFRICAN AMERICAN: 43 mL/min — AB (ref >60)
GLUCOSE: 191 mg/dL — AB (ref 65–99)
POTASSIUM: 3.4 mmol/L — AB (ref 3.5–5.1)
SODIUM: 149 mmol/L — AB (ref 135–145)

## 2015-12-06 MED ORDER — INSULIN ASPART 100 UNIT/ML ~~LOC~~ SOLN
4.0000 [IU] | Freq: Once | SUBCUTANEOUS | Status: AC
  Administered 2015-12-06: 4 [IU] via SUBCUTANEOUS

## 2015-12-06 MED ORDER — IPRATROPIUM-ALBUTEROL 0.5-2.5 (3) MG/3ML IN SOLN
RESPIRATORY_TRACT | Status: AC
  Administered 2015-12-06: 3 mL via RESPIRATORY_TRACT
  Filled 2015-12-06: qty 3

## 2015-12-06 MED ORDER — MORPHINE SULFATE (PF) 2 MG/ML IV SOLN
1.0000 mg | INTRAVENOUS | Status: DC | PRN
  Administered 2015-12-06 – 2015-12-07 (×4): 1 mg via INTRAVENOUS
  Administered 2015-12-07: 2 mg via INTRAVENOUS
  Administered 2015-12-07: 1 mg via INTRAVENOUS
  Filled 2015-12-06 (×6): qty 1

## 2015-12-06 MED ORDER — IPRATROPIUM-ALBUTEROL 0.5-2.5 (3) MG/3ML IN SOLN
3.0000 mL | Freq: Four times a day (QID) | RESPIRATORY_TRACT | Status: DC
  Administered 2015-12-06 – 2015-12-08 (×6): 3 mL via RESPIRATORY_TRACT
  Filled 2015-12-06 (×6): qty 3

## 2015-12-06 MED ORDER — DEXTROSE 5 % IV SOLN
INTRAVENOUS | Status: DC
  Administered 2015-12-06 – 2015-12-07 (×3): via INTRAVENOUS
  Administered 2015-12-08: 1000 mL via INTRAVENOUS
  Administered 2015-12-09: 08:00:00 via INTRAVENOUS

## 2015-12-06 MED ORDER — LORAZEPAM 2 MG/ML IJ SOLN: 1.0000 mg | Freq: Once | INTRAMUSCULAR | Status: DC

## 2015-12-06 NOTE — Progress Notes (Signed)
Night shift nurse reports pt has some intermittent chest pain that resolved, this morning pt reported chest pain for the last hour. md made aware

## 2015-12-06 NOTE — Progress Notes (Signed)
Pt refusing to eat meals, but will eat vanilla pudding, vanilla ice cream, and vanilla ensure shakes. Ordered extra pudding for pt.

## 2015-12-06 NOTE — Progress Notes (Signed)
Patient extremely restless, diaphoretic, CBG 314. Oncall MD notified.

## 2015-12-06 NOTE — Progress Notes (Signed)
Patient found by respiratory therapist sitting on edge of bed. RN and RT assisted patient back to bed. Patient tachypnic. RT providing breathing treatment. RN will reassess.

## 2015-12-06 NOTE — Progress Notes (Signed)
Patient very fidgety, continues to take telemetry leads and all clothing off. RN provided reassurance, repositioned, bathed, full linen change. Patient having difficulty breathing, RR 26. RN coached patient through deep breathing exercises. Patient continues to breath through mouth. Oxygen mask applied, respirations decreased, patient pulled O2 mask off, nasal canula reapplied. RN will continue to monitor.

## 2015-12-06 NOTE — Progress Notes (Signed)
[facility] Progress Note  Patient: [name] [name] [id number]   PCP: Odette Fraction, MD DOB: 1926/03/07   DOA: 12/02/2015   DOS: 12/06/2015   Date of Service: the patient was seen and examined on 12/06/2015  Subjective: Patient did complain of some chest pain last night as well as earlier this morning. The pain is worsening with cough and breathing and also feels like a sharp stabbing. Denies any other significant complaint. No nausea vomiting or diarrhea.  Nutrition: Tolerating diet  Brief hospital course: Patient was admitted on 12/02/2015, with complaint of seizure episode, was found to have sepsis most like is secondary to healthcare associated pneumonia associated with seizures. Also had mild elevation in serum troponin, acute hypoxic respiratory failure. Currently further plan is for continued treatment with broad-spectrum antibiotics and follow the cultures.  Assessment and Plan: 1. Sepsis ([place])  Acute hypoxic respiratory failure. COPD exacerbation. Right basal healthcare associated pneumonia, likely aspiration Blood cultures are negative at present. Check sputum cultures. Leukocytosis improving, hypoxia improving. Mental status is also stable. Continue cefepime, Mucinex and flutter device stop vancomycin since recent MRSA PCR in earlier admission was negative and there is high suspicion of aspiration pneumonia.  With bilateral expiratory wheezes and history of COPD I would continue steroids  2. New onset seizure. Likely metabolic in nature from hypoxia. EEG shows no evidence of epileptogenic focus. Neurology recommends discontinuing Aricept. Continue Dilantin was switched to oral  3. Dysphagia. Modified barium swallow today suggest dysphagia Diet. Continue Speech Therapy  4. Permanent atrial fibrillation, Mobitz type II AV block,  pacemaker implant. Coronary artery disease.  Essential hypertension Continue amiodarone. Continue Xarelto. Rate is well  controlled at present.  5. Chronic systolic CHF. Ejection fraction earlier in March 2017 25-30%. Patient was on Lasix which is currently on hold due to sepsis and lactic acidosis.  6. Elevated troponin. demand ischemia. Persistent chest pain, repeated EKG nonischemic. Limited echocardiogram.  7. Chronic kidney disease stage III. Serum creatinine back to baseline. We will monitor closely.  8. Pressure ulcer. Bilateral buttocks unstageable Frequent turns and monitoring, continue barrier cream  9. Severe protein calorie malnutrition. Continue ensure  10. Hypernatremia. Sodium level elevated. The patient does not drink any water by mouth, as he does not prefer the thickened water. And started on D5 at 50 mL per hour. Recheck sodium later in the evening.  Activity: physical therapy SNF Bowel regimen: last BM 12/04/2015 DVT Prophylaxis: on therapeutic anticoagulation. Nutrition: Dysphagia type 1 cardiac diet Advance goals of care discussion: DNR/DNI  HPI: As per the H and P dictated on admission, "[name] [name] is a 82 y.o. male who was recently admitted for acute respiratory failure requiring intubation and has had V. fib arrest was brought to the ER from the nursing home after patient was found to have a seizure and right-sided weakness. CT of the head did not show any acute. 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 Patient has chronic leukocytosis and patient ` yes and was found to be elevated with patient being mildly febrile and chest x-ray showing infiltrate patient is started on IV fluids antibiotics for pneumonia and admitted for further management. Patient on my exam is confused and oriented to his name only. Unable to move his right extremities. He had physician as discussed with patient's sister who has confirmed DO NOT RESUSCITATE status. " Procedures:  none Consultants: Neurology Antibiotics: Anti-infectives  Start     Dose/Rate Route Frequency Ordered Stop   12/04/15 2300  vancomycin (VANCOCIN) 1,250 mg in sodium chloride 0.9 % 250 mL IVPB  Status:  Discontinued     1,250 mg 166.7 mL/hr over 90 Minutes Intravenous Every 24 hours 12/04/15 1042 12/05/15 0802   12/04/15 0200  vancomycin (VANCOCIN) IVPB 750 mg/150 ml premix  Status:  Discontinued     750 mg 150 mL/hr over 60 Minutes Intravenous Every 24 hours 12/03/15 0434 12/04/15 1042   12/03/15 2200  ceFEPIme (MAXIPIME) 1 g in dextrose 5 % 50 mL IVPB     1 g 100 mL/hr over 30 Minutes Intravenous Every 24 hours 12/03/15 1115     12/03/15 0600  aztreonam (AZACTAM) 500 mg in dextrose 5 % 50 mL IVPB  Status:  Discontinued     500 mg 100 mL/hr over 30 Minutes Intravenous 3 times per day 12/03/15 0434 12/03/15 1100   12/03/15 0430  aztreonam (AZACTAM) 2 g in dextrose 5 % 50 mL IVPB  Status:  Discontinued     2 g 100 mL/hr over 30 Minutes Intravenous  Once 12/03/15 0426 12/03/15 0427   12/03/15 0430  vancomycin (VANCOCIN) IVPB 1000 mg/200 mL premix  Status:  Discontinued     1,000 mg 200 mL/hr over 60 Minutes Intravenous  Once 12/03/15 0426 12/03/15 0427   12/03/15 0145  vancomycin (VANCOCIN) IVPB 1000 mg/200 mL premix     1,000 mg 200 mL/hr over 60 Minutes Intravenous  Once 12/03/15 0135 12/03/15 0313   12/03/15 0145  ceFEPIme (MAXIPIME) 2 g in dextrose 5 % 50 mL IVPB     2 g 100 mL/hr over 30 Minutes Intravenous  Once 12/03/15 0139 12/03/15 0240      Family Communication: no family was present at bedside, at the time of interview.   Disposition:  Expected discharge date: 12/07/2015 Barriers to safe discharge: Improvement in oxygenation  No intake or output data in the 24 hours ending 12/06/15 1322 Filed Weights   12/02/15 2357 12/03/15 0400  Weight: 72 kg (158 lb 11.7 oz) 65.9 kg (145 lb 4.5 oz)    Objective: Physical Exam: Filed Vitals:   12/06/15 0300 12/06/15 0327  12/06/15 0922 12/06/15 1051  BP: 160/65 141/71 117/63 124/55  Pulse: 76 64 80 77  Temp:   98.2 F (36.8 C)   TempSrc:   Oral   Resp: 20 20 20 26   Height:      Weight:      SpO2: 100% 96% 97% 93%    General: Appear in moderate distress, no Rash; Oral Mucosa moist. Cardiovascular: S1 and S2 Present, no Murmur, no JVD Respiratory: Bilateral Air entry present and basal Crackles, bilateral expiratory wheezes Abdomen: Bowel Sound present, Soft and no tenderness Extremities: no Pedal edema, no calf tenderness Neurology: Grossly no focal neuro deficit.  Data Reviewed: CBC:  Recent Labs Lab 12/02/15 2340 12/02/15 2352 12/03/15 0515 12/04/15 0930 12/05/15 0359 12/06/15 0536  WBC 21.9*  --  20.0* 16.7* 15.1* 12.4*  NEUTROABS 10.5*  --  8.2* 6.5 4.7  --   HGB 11.3* 13.6 8.9* 9.0* 9.9* 10.3*  HCT 36.1* 40.0 28.7* 28.6* 31.8* 33.1*  MCV 91.6  --  92.6 91.1 91.1 92.2  PLT 134*  --  108* 105* 94* 0000000*   Basic Metabolic Panel:  Recent Labs Lab 12/02/15 2340 12/02/15 2352 12/03/15 0515 12/04/15 0930 12/05/15 0359 12/06/15 0536  NA 145 144 145 146* 147* 149*  K 3.8 3.8 3.8  3.3* 3.9 3.4*  CL 102 102 107 110 110 106  CO2 28  --  28 27 28 29   GLUCOSE 156* 150* 177* 147* 128* 191*  BUN 49* 53* 46* 35* 29* 28*  CREATININE 2.56* 2.30* 2.36* 1.66* 1.45* 1.40*  CALCIUM 9.2  --  8.2* 8.8* 8.9 9.5  MG  --   --   --  2.1  --   --    Liver Function Tests:  Recent Labs Lab 12/02/15 2340 12/03/15 0515 12/04/15 0930 12/05/15 0359  AST 30 21 20 24   ALT 16* 13* 15* 17  ALKPHOS 122 100 113 116  BILITOT 1.0 0.8 0.8 0.9  PROT 5.8* 5.2* 5.3* 5.2*  ALBUMIN 2.4* 2.0* 2.0* 1.9*   No results for input(s): LIPASE, AMYLASE in the last 168 hours. No results for input(s): AMMONIA in the last 168 hours.  Cardiac Enzymes:  Recent Labs Lab 12/03/15 1545 12/03/15 1900 12/04/15 0045 12/04/15 0930 12/06/15 0906  TROPONINI 0.16* 0.08* 0.07* 0.10* 0.04*    BNP (last 3 results)  Recent  Labs  11/10/15 1456 11/16/15 0353 12/02/15 2345  BNP 1792.8* 406.2* 488.1*    CBG:  Recent Labs Lab 12/01/15 2248 12/03/15 0346 12/05/15 2229 12/06/15 0023 12/06/15 0412  GLUCAP 133* 160* 307* 314* 177*    Recent Results (from the past 240 hour(s))  Culture, body fluid-bottle     Status: None   Collection Time: 11/27/15  3:40 PM  Result Value Ref Range Status   Specimen Description FLUID RIGHT PLEURAL  Final   Special Requests BOTTLES DRAWN AEROBIC AND ANAEROBIC 10CC  Final   Culture NO GROWTH 5 DAYS  Final   Report Status 12/02/2015 FINAL  Final  Gram stain     Status: None   Collection Time: 11/27/15  3:40 PM  Result Value Ref Range Status   Specimen Description FLUID RIGHT PLEURAL  Final   Special Requests NONE  Final   Gram Stain   Final    MODERATE WBC PRESENT,BOTH PMN AND MONONUCLEAR NO ORGANISMS SEEN    Report Status 11/27/2015 FINAL  Final  Blood culture (routine x 2)     Status: None (Preliminary result)   Collection Time: 12/03/15 12:15 AM  Result Value Ref Range Status   Specimen Description BLOOD RIGHT ARM  Final   Special Requests BOTTLES DRAWN AEROBIC AND ANAEROBIC 5ML  Final   Culture NO GROWTH 2 DAYS  Final   Report Status PENDING  Incomplete  Blood culture (routine x 2)     Status: None (Preliminary result)   Collection Time: 12/03/15 12:45 AM  Result Value Ref Range Status   Specimen Description BLOOD RIGHT FOREARM  Final   Special Requests BOTTLES DRAWN AEROBIC AND ANAEROBIC 5ML  Final   Culture NO GROWTH 2 DAYS  Final   Report Status PENDING  Incomplete     Studies: No results found.   Scheduled Meds: . amiodarone  200 mg Oral Daily  . atorvastatin  10 mg Oral Daily  . ceFEPime (MAXIPIME) IV  1 g Intravenous Q24H  . divalproex  500 mg Oral Q12H  . feeding supplement (ENSURE ENLIVE)  237 mL Oral BID BM  . fluticasone  1 spray Each Nare Daily  . guaiFENesin  10 mL Oral TID PC & HS  . ipratropium-albuterol  3 mL Nebulization TID  .  LORazepam  1 mg Intravenous Once  . methylPREDNISolone (SOLU-MEDROL) injection  60 mg Intravenous Q12H  . RESOURCE THICKENUP CLEAR   Oral  Once  . rivaroxaban  15 mg Oral Q supper  . tamsulosin  0.4 mg Oral Daily   Continuous Infusions: . dextrose 50 mL/hr at 12/06/15 0930   PRN Meds: albuterol, morphine injection  Time spent: 30 minutes  Author: Berle Mull, MD [facility] Pager: [phone] 12/06/2015 1:22 PM  If 7PM-7AM, please contact night-coverage at [email or web address], password [facility]

## 2015-12-06 NOTE — Progress Notes (Signed)
Sister at bedside, rn informed sister that pt had a fall/ was on the side of the bed with his knees on the ground. md also made aware this morning.

## 2015-12-06 NOTE — Progress Notes (Addendum)
Patient bed alarm sounded at 0220. NT first to arrive, when RN entered patient room patient on his knees, leaning over bed. Patient had removed all clothing and was urinating on floor. Patient assisted back to bed. RN completed a full head to toe assessment. Patient states he has sharp pain in his chest at sternum. Vital signs obtained, see flowsheet. MD notified. RN will notify family. RN will continue to monitor patient.

## 2015-12-07 ENCOUNTER — Inpatient Hospital Stay (HOSPITAL_COMMUNITY): Payer: Medicare Other

## 2015-12-07 ENCOUNTER — Other Ambulatory Visit: Payer: Self-pay

## 2015-12-07 DIAGNOSIS — R079 Chest pain, unspecified: Secondary | ICD-10-CM

## 2015-12-07 LAB — CBC WITH DIFFERENTIAL/PLATELET
BASOS PCT: 0 %
Basophils Absolute: 0 10*3/uL (ref 0.0–0.1)
EOS PCT: 0 %
Eosinophils Absolute: 0 10*3/uL (ref 0.0–0.7)
HEMATOCRIT: 30.2 % — AB (ref 39.0–52.0)
Hemoglobin: 9.4 g/dL — ABNORMAL LOW (ref 13.0–17.0)
LYMPHS PCT: 63 %
Lymphs Abs: 8.5 10*3/uL — ABNORMAL HIGH (ref 0.7–4.0)
MCH: 28.5 pg (ref 26.0–34.0)
MCHC: 31.1 g/dL (ref 30.0–36.0)
MCV: 91.5 fL (ref 78.0–100.0)
MONOS PCT: 3 %
Monocytes Absolute: 0.4 10*3/uL (ref 0.1–1.0)
NEUTROS PCT: 34 %
Neutro Abs: 4.6 10*3/uL (ref 1.7–7.7)
Platelets: 95 10*3/uL — ABNORMAL LOW (ref 150–400)
RBC: 3.3 MIL/uL — ABNORMAL LOW (ref 4.22–5.81)
RDW: 19 % — ABNORMAL HIGH (ref 11.5–15.5)
WBC: 13.5 10*3/uL — ABNORMAL HIGH (ref 4.0–10.5)

## 2015-12-07 LAB — COMPREHENSIVE METABOLIC PANEL
ALBUMIN: 2 g/dL — AB (ref 3.5–5.0)
ALK PHOS: 107 U/L (ref 38–126)
ALT: 22 U/L (ref 17–63)
ALT: 20 U/L (ref 17–63)
ANION GAP: 9 (ref 5–15)
AST: 25 U/L (ref 15–41)
AST: 20 U/L (ref 15–41)
Albumin: 2.1 g/dL — ABNORMAL LOW (ref 3.5–5.0)
Alkaline Phosphatase: 120 U/L (ref 38–126)
Anion gap: 11 (ref 5–15)
BILIRUBIN TOTAL: 0.4 mg/dL (ref 0.3–1.2)
BUN: 37 mg/dL — AB (ref 6–20)
BUN: 38 mg/dL — ABNORMAL HIGH (ref 6–20)
CHLORIDE: 111 mmol/L (ref 101–111)
CHLORIDE: 110 mmol/L (ref 101–111)
CO2: 26 mmol/L (ref 22–32)
CO2: 28 mmol/L (ref 22–32)
CREATININE: 1.5 mg/dL — AB (ref 0.61–1.24)
Calcium: 9.6 mg/dL (ref 8.9–10.3)
Calcium: 9.3 mg/dL (ref 8.9–10.3)
Creatinine, Ser: 1.54 mg/dL — ABNORMAL HIGH (ref 0.61–1.24)
GFR calc Af Amer: 46 mL/min — ABNORMAL LOW (ref >60)
GFR calc Af Amer: 45 mL/min — ABNORMAL LOW (ref >60)
GFR calc non Af Amer: 39 mL/min — ABNORMAL LOW (ref >60)
GFR, EST NON AFRICAN AMERICAN: 40 mL/min — AB (ref >60)
GLUCOSE: 260 mg/dL — AB (ref 65–99)
Glucose, Bld: 319 mg/dL — ABNORMAL HIGH (ref 65–99)
POTASSIUM: 4.6 mmol/L (ref 3.5–5.1)
Potassium: 4.2 mmol/L (ref 3.5–5.1)
SODIUM: 147 mmol/L — AB (ref 135–145)
Sodium: 148 mmol/L — ABNORMAL HIGH (ref 135–145)
TOTAL PROTEIN: 5.1 g/dL — AB (ref 6.5–8.1)
Total Bilirubin: 0.5 mg/dL (ref 0.3–1.2)
Total Protein: 4.8 g/dL — ABNORMAL LOW (ref 6.5–8.1)

## 2015-12-07 LAB — BLOOD GAS, ARTERIAL
ACID-BASE EXCESS: 5.6 mmol/L — AB (ref 0.0–2.0)
Bicarbonate: 29.3 mEq/L — ABNORMAL HIGH (ref 20.0–24.0)
Drawn by: 44898
O2 Content: 2 L/min
O2 SAT: 93.1 %
PATIENT TEMPERATURE: 98.6
PCO2 ART: 40.8 mmHg (ref 35.0–45.0)
TCO2: 30.6 mmol/L (ref 0–100)
pH, Arterial: 7.47 — ABNORMAL HIGH (ref 7.350–7.450)
pO2, Arterial: 69.8 mmHg — ABNORMAL LOW (ref 80.0–100.0)

## 2015-12-07 LAB — GLUCOSE, CAPILLARY
Glucose-Capillary: 241 mg/dL — ABNORMAL HIGH (ref 65–99)
Glucose-Capillary: 307 mg/dL — ABNORMAL HIGH (ref 65–99)
Glucose-Capillary: 254 mg/dL — ABNORMAL HIGH (ref 65–99)

## 2015-12-07 LAB — ECHOCARDIOGRAM LIMITED
HEIGHTINCHES: 71 in
WEIGHTICAEL: 2324.53 [oz_av]

## 2015-12-07 LAB — VALPROIC ACID LEVEL: Valproic Acid Lvl: 52 ug/mL (ref 50.0–100.0)

## 2015-12-07 LAB — PATHOLOGIST SMEAR REVIEW

## 2015-12-07 MED ORDER — ALPRAZOLAM 0.25 MG PO TABS: 0.2500 mg | ORAL_TABLET | Freq: Three times a day (TID) | ORAL | Status: DC | PRN

## 2015-12-07 MED ORDER — INSULIN ASPART 100 UNIT/ML ~~LOC~~ SOLN
4.0000 [IU] | Freq: Once | SUBCUTANEOUS | Status: AC
  Administered 2015-12-07: 4 [IU] via SUBCUTANEOUS

## 2015-12-07 MED ORDER — METHYLPREDNISOLONE SODIUM SUCC 125 MG IJ SOLR
60.0000 mg | Freq: Every day | INTRAMUSCULAR | Status: DC
  Administered 2015-12-08 – 2015-12-09 (×2): 60 mg via INTRAVENOUS
  Filled 2015-12-07 (×2): qty 2

## 2015-12-07 MED ORDER — CLINDAMYCIN PHOSPHATE 600 MG/50ML IV SOLN
600.0000 mg | Freq: Three times a day (TID) | INTRAVENOUS | Status: DC
  Administered 2015-12-07 (×2): 600 mg via INTRAVENOUS
  Filled 2015-12-07 (×5): qty 50

## 2015-12-07 MED ORDER — INSULIN ASPART 100 UNIT/ML ~~LOC~~ SOLN
0.0000 [IU] | Freq: Every day | SUBCUTANEOUS | Status: DC
  Administered 2015-12-07: 3 [IU] via SUBCUTANEOUS

## 2015-12-07 MED ORDER — QUETIAPINE FUMARATE 25 MG PO TABS: 25.0000 mg | ORAL_TABLET | Freq: Every day | ORAL | Status: DC

## 2015-12-07 MED ORDER — INSULIN ASPART 100 UNIT/ML ~~LOC~~ SOLN
0.0000 [IU] | Freq: Three times a day (TID) | SUBCUTANEOUS | Status: DC
  Administered 2015-12-07: 7 [IU] via SUBCUTANEOUS
  Administered 2015-12-07: 3 [IU] via SUBCUTANEOUS
  Administered 2015-12-08: 9 [IU] via SUBCUTANEOUS
  Administered 2015-12-08: 2 [IU] via SUBCUTANEOUS

## 2015-12-07 MED ORDER — VALPROATE SODIUM 500 MG/5ML IV SOLN
500.0000 mg | Freq: Two times a day (BID) | INTRAVENOUS | Status: DC
  Administered 2015-12-07 – 2015-12-09 (×4): 500 mg via INTRAVENOUS
  Filled 2015-12-07 (×7): qty 5

## 2015-12-07 MED ORDER — DOXAZOSIN MESYLATE 1 MG PO TABS
1.0000 mg | ORAL_TABLET | Freq: Every day | ORAL | Status: DC
  Administered 2015-12-07 – 2015-12-08 (×2): 1 mg via ORAL
  Filled 2015-12-07 (×6): qty 1

## 2015-12-07 NOTE — Care Management Note (Signed)
Case Management Note  Patient Details  Name: DEVUN ENGBERG MRN: [id number] Date of Birth: 02-27-1926  Subjective/Objective:     Patient admitted from [facility] with sepsis and seizures.            Action/Plan: Plan is to return to [facility] when patient medically stable. CM will follow for discharge needs.   Expected Discharge Date:                  Expected Discharge Plan:  Skilled Nursing Facility  In-House Referral:  Clinical Social Work  Discharge planning Services  CM Consult  Post Acute Care Choice:    Choice offered to:     DME Arranged:    DME Agency:     HH Arranged:    [place] Agency:     Status of Service:  In process, will continue to follow  Medicare Important Message Given:  Yes Date Medicare IM Given:    Medicare IM give by:    Date Additional Medicare IM Given:    Additional Medicare Important Message give by:     If discussed at [place] of Stay Meetings, dates discussed:    Additional Comments:  Pollie Friar, RN 12/07/2015, 3:47 PM

## 2015-12-07 NOTE — Progress Notes (Signed)
RT called, when be up to get ABG as soon as possible.

## 2015-12-07 NOTE — Progress Notes (Signed)
  Echocardiogram 2D Echocardiogram has been performed.  Bobbye Charleston 12/07/2015, 11:36 AM

## 2015-12-07 NOTE — Progress Notes (Signed)
Pt to CT

## 2015-12-07 NOTE — Care Management Important Message (Signed)
Important Message  Patient Details  Name: [name] [name] MRN: [place]:[id number] Date of Birth: 11-12-1925   Medicare Important Message Given:  Yes    Laquinton Bihm P Sherina Stammer 12/07/2015, 1:19 PM

## 2015-12-07 NOTE — Progress Notes (Signed)
Speech Language Pathology Treatment: Dysphagia  Patient Details Name: [name] [name] MRN: [place]:[id number] DOB: [birth year] Today's Date: 12/07/2015 Time: 0950-1010 SLP Time Calculation (min) (ACUTE ONLY): 20 min  Assessment / Plan / Recommendation Clinical Impression  F/u after Friday's MBS, which revealed a primary esophageal dysphagia with barium residue throughout esophagus, explaining pt's early satiety and may result in backflow from esophagus being aspirated.  Pt more confused today, has had increased restlessness, lungs with rhonchi and exp wheezes.  Assisted with breakfast - limited appetite; consumed 4 oz Magic cup, three boluses of grits and thickened orange juice before declining further POs.  Demonstrates oral holding of pills - recommend crushing when able and giving with puree.  Pt remains an aspiration risk given MS changes and esophageal issues > oropharyngeal.  Recommend continued SLP f/u for safety/toleration and further education with sister.       HPI HPI: 82 y.o. male with a history of dementia who was just recently hospitalized with a prolonged stabilization following flu cardiac arrest on 3/10.  Admitted with seizure from SNF.       SLP Plan  Continue with current plan of care     Recommendations  Diet recommendations: Dysphagia 1 (puree);Nectar-thick liquid Liquids provided via: Cup Medication Administration: Crushed with puree Supervision: Staff to assist with self feeding Compensations: Small sips/bites Postural Changes and/or Swallow Maneuvers: Seated upright 90 degrees;Upright 30-60 min after meal             Oral Care Recommendations: Oral care BID Follow up Recommendations: Skilled Nursing facility Plan: Continue with current plan of care     [name] [name] L. Tivis Ringer, [place] CCC/SLP Pager [phone]                [name] [name] [name] 12/07/2015, 10:13 AM

## 2015-12-07 NOTE — Progress Notes (Signed)
md paged about ABG results

## 2015-12-07 NOTE — Progress Notes (Signed)
On-call MD notified serum blood glucose 319. RN will continue to monitor.

## 2015-12-07 NOTE — Progress Notes (Signed)
Night shift nurse passed along that pt has sores on penis. Day shift rn confirmed. md made aware. RPR ordered.

## 2015-12-07 NOTE — Progress Notes (Signed)
Pt back from CT

## 2015-12-07 NOTE — Progress Notes (Signed)
Telemetry called saying pts pacemaker shows up rarely. rn feels pacemaker in pts left chest.

## 2015-12-07 NOTE — Progress Notes (Signed)
[facility] Progress Note  Patient: [name] [name] [id number]   PCP: Odette Fraction, MD DOB: 12-28-1925   DOA: 12/02/2015   DOS: 12/07/2015   Date of Service: the patient was seen and examined on 12/07/2015  Subjective: The nurse has noted some papular lesions on the penis and scrotum. Patient does not have any significant discharge or tenderness from this lesions. Patient is more lethargic and confused this morning. No nausea no vomiting. Nutrition: Tolerating diet  Brief hospital course: Patient was admitted on 12/02/2015, with complaint of seizure episode, was found to have sepsis most like is secondary to healthcare associated pneumonia associated with seizures. Also had mild elevation in serum troponin, acute hypoxic respiratory failure. Currently further plan is continue antibiotic, follow CT scan.  Assessment and Plan: 1. Sepsis ([place])  Acute hypoxic respiratory failure. COPD exacerbation. Right basal healthcare associated pneumonia, likely aspiration Blood cultures are negative at present. No specimen available sputum cultures. Leukocytosis improving, hypoxia or the same. Mental status is mildly worsening Continue cefepime, Mucinex and flutter device Discontinued earlier vancomycin since recent MRSA PCR in earlier admission was negative and there is high suspicion of aspiration pneumonia. Tapered down steroids  2. New onset seizure. Likely metabolic in nature from hypoxia. EEG shows no evidence of epileptogenic focus. Neurology recommends discontinuing Aricept. Continue Dilantin was switched to oral, check levels before the next dose. Check CT head  3. Dysphagia.  Patient is having more difficulty swallowing this morning. Appears more lethargic this morning. Check CT head. Modified barium swallow today suggest dysphagia Diet. Continue Speech Therapy  4. Permanent atrial fibrillation, Mobitz type II AV block,  pacemaker implant. Coronary artery disease.    Essential hypertension Continue amiodarone. Continue Xarelto. Rate is well controlled at present.  5. Chronic systolic CHF. Ejection fraction earlier in March 2017 25-30%. Patient was on Lasix which is currently on hold due to sepsis and lactic acidosis.  6. Elevated troponin. demand ischemia. Long QTC Persistent chest pain, repeated EKG nonischemic. Limited echocardiogram shows improvement in ejection fraction without any wall motion abnormality. EKG this morning continues to show prolonged QTC or avoid prolonging medications  7. Chronic kidney disease stage III. Serum creatinine back to baseline. We will monitor closely.  8. Pressure ulcer. Bilateral buttocks unstageable Frequent turns and monitoring, continue barrier cream  9. Severe protein calorie malnutrition. Continue ensure  10. Hypernatremia. Sodium level elevated. The patient does not drink any water by mouth, as he does not prefer the thickened water. started on D5 at 50 mL per hour. Gradually improving.  11. Steroid-induced hyperglycemia. On sliding scale insulin. Tapering down steroids.  12. Papular rash in the groin area. Follow-up RPR.  Activity: physical therapy SNF Bowel regimen: last BM 12/06/2015 DVT Prophylaxis: on therapeutic anticoagulation. Nutrition: Dysphagia type 1 cardiac diet Advance goals of care discussion: DNR/DNI  HPI: As per the H and P dictated on admission, "[name] [name] is a 82 y.o. male who was recently admitted for acute respiratory failure requiring intubation and has had V. fib arrest was brought to the ER from the nursing home after patient was found to have a seizure and right-sided weakness. CT of the head did not show any acute. 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 Patient has chronic leukocytosis and patient ` yes and was found to be elevated with patient  being mildly febrile and chest x-ray showing infiltrate patient is started on IV  fluids antibiotics for pneumonia and admitted for further management. Patient on my exam is confused and oriented to his name only. Unable to move his right extremities. He had physician as discussed with patient's sister who has confirmed DO NOT RESUSCITATE status. " Procedures: Echocardiogram Consultants: Neurology Antibiotics: Anti-infectives    Start     Dose/Rate Route Frequency Ordered Stop   12/04/15 2300  vancomycin (VANCOCIN) 1,250 mg in sodium chloride 0.9 % 250 mL IVPB  Status:  Discontinued     1,250 mg 166.7 mL/hr over 90 Minutes Intravenous Every 24 hours 12/04/15 1042 12/05/15 0802   12/04/15 0200  vancomycin (VANCOCIN) IVPB 750 mg/150 ml premix  Status:  Discontinued     750 mg 150 mL/hr over 60 Minutes Intravenous Every 24 hours 12/03/15 0434 12/04/15 1042   12/03/15 2200  ceFEPIme (MAXIPIME) 1 g in dextrose 5 % 50 mL IVPB     1 g 100 mL/hr over 30 Minutes Intravenous Every 24 hours 12/03/15 1115     12/03/15 0600  aztreonam (AZACTAM) 500 mg in dextrose 5 % 50 mL IVPB  Status:  Discontinued     500 mg 100 mL/hr over 30 Minutes Intravenous 3 times per day 12/03/15 0434 12/03/15 1100   12/03/15 0430  aztreonam (AZACTAM) 2 g in dextrose 5 % 50 mL IVPB  Status:  Discontinued     2 g 100 mL/hr over 30 Minutes Intravenous  Once 12/03/15 0426 12/03/15 0427   12/03/15 0430  vancomycin (VANCOCIN) IVPB 1000 mg/200 mL premix  Status:  Discontinued     1,000 mg 200 mL/hr over 60 Minutes Intravenous  Once 12/03/15 0426 12/03/15 0427   12/03/15 0145  vancomycin (VANCOCIN) IVPB 1000 mg/200 mL premix     1,000 mg 200 mL/hr over 60 Minutes Intravenous  Once 12/03/15 0135 12/03/15 0313   12/03/15 0145  ceFEPIme (MAXIPIME) 2 g in dextrose 5 % 50 mL IVPB     2 g 100 mL/hr over 30 Minutes Intravenous  Once 12/03/15 0139 12/03/15 0240      Family Communication: no family was present at bedside, at the time of  interview.   Disposition:  Expected discharge date: 12/09/2015 Barriers to safe discharge: Improvement in oxygenation  No intake or output data in the 24 hours ending 12/07/15 1353 Filed Weights   12/02/15 2357 12/03/15 0400  Weight: 72 kg (158 lb 11.7 oz) 65.9 kg (145 lb 4.5 oz)    Objective: Physical Exam: Filed Vitals:   12/07/15 0355 12/07/15 0452 12/07/15 0827 12/07/15 0834  BP: 141/94 115/61  149/75  Pulse: 81 77 74 76  Temp: 97.6 F (36.4 C) 97.4 F (36.3 C)  98.8 F (37.1 C)  TempSrc: Oral Oral  Oral  Resp: 22 20 25 26   Height:      Weight:      SpO2: 93% 97%  97%    General: Appear in moderate distress, Papular nodular lesions in the groin area; Oral Mucosa moist. Cardiovascular: S1 and S2 Present, no Murmur, no JVD Respiratory: Bilateral Air entry present and basal Crackles, bilateral expiratory wheezes Abdomen: Bowel Sound present, Soft and no tenderness Extremities: no Pedal edema, no calf tenderness Neurology: Grossly no focal neuro deficit, But increased lethargy and confusion  Data Reviewed: CBC:  Recent Labs Lab 12/02/15 2340  12/03/15 0515 12/04/15 0930 12/05/15 0359 12/06/15 0536 12/07/15 0257  WBC 21.9*  --  20.0* 16.7* 15.1* 12.4* 13.5*  NEUTROABS 10.5*  --  8.2* 6.5 4.7  --  4.6  HGB 11.3*  < > 8.9* 9.0* 9.9* 10.3* 9.4*  HCT 36.1*  < > 28.7* 28.6* 31.8* 33.1* 30.2*  MCV 91.6  --  92.6 91.1 91.1 92.2 91.5  PLT 134*  --  108* 105* 94* 109* 95*  < > = values in this interval not displayed. Basic Metabolic Panel:  Recent Labs Lab 12/03/15 0515 12/04/15 0930 12/05/15 0359 12/06/15 0536 12/07/15 0257  NA 145 146* 147* 149* 148*  K 3.8 3.3* 3.9 3.4* 4.2  CL 107 110 110 106 111  CO2 28 27 28 29 26   GLUCOSE 177* 147* 128* 191* 319*  BUN 46* 35* 29* 28* 37*  CREATININE 2.36* 1.66* 1.45* 1.40* 1.50*  CALCIUM 8.2* 8.8* 8.9 9.5 9.6  MG  --  2.1  --   --   --    Liver Function Tests:  Recent Labs Lab 12/02/15 2340 12/03/15 0515  12/04/15 0930 12/05/15 0359 12/07/15 0257  AST 30 21 20 24 25   ALT 16* 13* 15* 17 22  ALKPHOS 122 100 113 116 120  BILITOT 1.0 0.8 0.8 0.9 0.4  PROT 5.8* 5.2* 5.3* 5.2* 5.1*  ALBUMIN 2.4* 2.0* 2.0* 1.9* 2.1*   No results for input(s): LIPASE, AMYLASE in the last 168 hours. No results for input(s): AMMONIA in the last 168 hours.  Cardiac Enzymes:  Recent Labs Lab 12/04/15 0045 12/04/15 0930 12/06/15 0906 12/06/15 1524 12/06/15 2048  TROPONINI 0.07* 0.10* 0.04* 0.04* 0.04*    BNP (last 3 results)  Recent Labs  11/10/15 1456 11/16/15 0353 12/02/15 2345  BNP 1792.8* 406.2* 488.1*    CBG:  Recent Labs Lab 12/03/15 0346 12/05/15 2229 12/06/15 0023 12/06/15 0412 12/07/15 1217  GLUCAP 160* 307* 314* 177* 241*    Recent Results (from the past 240 hour(s))  Culture, body fluid-bottle     Status: None   Collection Time: 11/27/15  3:40 PM  Result Value Ref Range Status   Specimen Description FLUID RIGHT PLEURAL  Final   Special Requests BOTTLES DRAWN AEROBIC AND ANAEROBIC 10CC  Final   Culture NO GROWTH 5 DAYS  Final   Report Status 12/02/2015 FINAL  Final  Gram stain     Status: None   Collection Time: 11/27/15  3:40 PM  Result Value Ref Range Status   Specimen Description FLUID RIGHT PLEURAL  Final   Special Requests NONE  Final   Gram Stain   Final    MODERATE WBC PRESENT,BOTH PMN AND MONONUCLEAR NO ORGANISMS SEEN    Report Status 11/27/2015 FINAL  Final  Blood culture (routine x 2)     Status: None (Preliminary result)   Collection Time: 12/03/15 12:15 AM  Result Value Ref Range Status   Specimen Description BLOOD RIGHT ARM  Final   Special Requests BOTTLES DRAWN AEROBIC AND ANAEROBIC 5ML  Final   Culture NO GROWTH 4 DAYS  Final   Report Status PENDING  Incomplete  Blood culture (routine x 2)     Status: None (Preliminary result)   Collection Time: 12/03/15 12:45 AM  Result Value Ref Range Status   Specimen Description BLOOD RIGHT FOREARM  Final    Special Requests BOTTLES DRAWN AEROBIC AND ANAEROBIC 5ML  Final   Culture NO GROWTH 4 DAYS  Final   Report Status PENDING  Incomplete     Studies: No results found.   Scheduled Meds: . amiodarone  200 mg Oral Daily  . atorvastatin  10 mg Oral Daily  . ceFEPime (MAXIPIME) IV  1 g Intravenous Q24H  . divalproex  500 mg Oral Q12H  . doxazosin  1 mg Oral Daily  . feeding supplement (ENSURE ENLIVE)  237 mL Oral BID BM  . fluticasone  1 spray Each Nare Daily  . guaiFENesin  10 mL Oral TID PC & HS  . insulin aspart  0-5 Units Subcutaneous QHS  . insulin aspart  0-9 Units Subcutaneous TID WC  . ipratropium-albuterol  3 mL Nebulization Q6H  . [START ON 12/08/2015] methylPREDNISolone (SOLU-MEDROL) injection  60 mg Intravenous Daily  . QUEtiapine  25 mg Oral QHS  . [place]   Oral Once  . rivaroxaban  15 mg Oral Q supper   Continuous Infusions: . dextrose 50 mL/hr at 12/07/15 1041   PRN Meds: albuterol, ALPRAZolam  Time spent: 30 minutes  Author: Berle Mull, MD [facility] Pager: [phone] 12/07/2015 1:53 PM  If 7PM-7AM, please contact night-coverage at [email or web address], password [facility]

## 2015-12-07 NOTE — Progress Notes (Addendum)
rn and tech came in to check on pt. Pt staring off into space, not responding. Pt then shut his eyes and will now not open them. Even with sternal rub pt will not open eyes. Will not tell nurse what his name is. md paged.

## 2015-12-07 NOTE — Progress Notes (Signed)
rn went back into room to check on pt, pt now opening eyes and responding. Pt stated his whole name. Unsure if pt was having a seizure earlier when he was staring off into space and not responding.

## 2015-12-08 ENCOUNTER — Inpatient Hospital Stay (HOSPITAL_COMMUNITY): Payer: Medicare Other

## 2015-12-08 ENCOUNTER — Ambulatory Visit: Payer: Medicare Other | Admitting: Cardiology

## 2015-12-08 LAB — COMPREHENSIVE METABOLIC PANEL
ALK PHOS: 106 U/L (ref 38–126)
ALT: 20 U/L (ref 17–63)
AST: 19 U/L (ref 15–41)
Albumin: 2.1 g/dL — ABNORMAL LOW (ref 3.5–5.0)
Anion gap: 10 (ref 5–15)
BILIRUBIN TOTAL: 0.5 mg/dL (ref 0.3–1.2)
BUN: 36 mg/dL — ABNORMAL HIGH (ref 6–20)
CALCIUM: 9.3 mg/dL (ref 8.9–10.3)
CO2: 31 mmol/L (ref 22–32)
CREATININE: 1.56 mg/dL — AB (ref 0.61–1.24)
Chloride: 111 mmol/L (ref 101–111)
GFR, EST AFRICAN AMERICAN: 44 mL/min — AB (ref >60)
GFR, EST NON AFRICAN AMERICAN: 38 mL/min — AB (ref >60)
Glucose, Bld: 138 mg/dL — ABNORMAL HIGH (ref 65–99)
Potassium: 4.4 mmol/L (ref 3.5–5.1)
Sodium: 152 mmol/L — ABNORMAL HIGH (ref 135–145)
TOTAL PROTEIN: 5 g/dL — AB (ref 6.5–8.1)

## 2015-12-08 LAB — GLUCOSE, CAPILLARY
GLUCOSE-CAPILLARY: 190 mg/dL — AB (ref 65–99)
GLUCOSE-CAPILLARY: 353 mg/dL — AB (ref 65–99)
Glucose-Capillary: 83 mg/dL (ref 65–99)
Glucose-Capillary: 250 mg/dL — ABNORMAL HIGH (ref 65–99)

## 2015-12-08 LAB — CULTURE, BLOOD (ROUTINE X 2)
CULTURE: NO GROWTH
Culture: NO GROWTH

## 2015-12-08 LAB — LACTIC ACID, PLASMA
LACTIC ACID, VENOUS: 2.5 mmol/L — AB (ref 0.5–2.0)
LACTIC ACID, VENOUS: 3.3 mmol/L — AB (ref 0.5–2.0)

## 2015-12-08 LAB — AMMONIA: AMMONIA: 9 umol/L (ref 9–35)

## 2015-12-08 LAB — SODIUM
SODIUM: 150 mmol/L — AB (ref 135–145)
Sodium: 152 mmol/L — ABNORMAL HIGH (ref 135–145)
Sodium: 147 mmol/L — ABNORMAL HIGH (ref 135–145)

## 2015-12-08 LAB — BRAIN NATRIURETIC PEPTIDE: B Natriuretic Peptide: 405.9 pg/mL — ABNORMAL HIGH (ref 0.0–100.0)

## 2015-12-08 LAB — CBC
HCT: 28.5 % — ABNORMAL LOW (ref 39.0–52.0)
Hemoglobin: 8.4 g/dL — ABNORMAL LOW (ref 13.0–17.0)
MCH: 27.3 pg (ref 26.0–34.0)
MCHC: 29.5 g/dL — AB (ref 30.0–36.0)
MCV: 92.5 fL (ref 78.0–100.0)
PLATELETS: 100 10*3/uL — AB (ref 150–400)
RBC: 3.08 MIL/uL — ABNORMAL LOW (ref 4.22–5.81)
RDW: 19.4 % — ABNORMAL HIGH (ref 11.5–15.5)
WBC: 15.4 10*3/uL — ABNORMAL HIGH (ref 4.0–10.5)

## 2015-12-08 LAB — TSH: TSH: 1.317 u[IU]/mL (ref 0.350–4.500)

## 2015-12-08 LAB — RPR: RPR: NONREACTIVE

## 2015-12-08 LAB — MRSA PCR SCREENING: MRSA by PCR: NEGATIVE

## 2015-12-08 MED ORDER — INSULIN ASPART 100 UNIT/ML ~~LOC~~ SOLN: 0.0000 [IU] | Freq: Every day | SUBCUTANEOUS | Status: DC

## 2015-12-08 MED ORDER — IPRATROPIUM-ALBUTEROL 0.5-2.5 (3) MG/3ML IN SOLN
3.0000 mL | Freq: Four times a day (QID) | RESPIRATORY_TRACT | Status: DC
  Administered 2015-12-08 – 2015-12-11 (×14): 3 mL via RESPIRATORY_TRACT
  Filled 2015-12-08 (×14): qty 3

## 2015-12-08 MED ORDER — INSULIN ASPART 100 UNIT/ML ~~LOC~~ SOLN: 0.0000 [IU] | Freq: Three times a day (TID) | SUBCUTANEOUS | Status: DC

## 2015-12-08 MED ORDER — CEFTRIAXONE SODIUM 1 G IJ SOLR
1.0000 g | INTRAMUSCULAR | Status: DC
  Administered 2015-12-08 – 2015-12-09 (×2): 1 g via INTRAVENOUS
  Filled 2015-12-08 (×2): qty 10

## 2015-12-08 MED ORDER — METRONIDAZOLE IN NACL 5-0.79 MG/ML-% IV SOLN
500.0000 mg | Freq: Three times a day (TID) | INTRAVENOUS | Status: DC
  Administered 2015-12-08 – 2015-12-09 (×4): 500 mg via INTRAVENOUS
  Filled 2015-12-08 (×6): qty 100

## 2015-12-08 MED ORDER — DIPHENHYDRAMINE HCL 50 MG/ML IJ SOLN
12.5000 mg | Freq: Once | INTRAMUSCULAR | Status: AC
  Administered 2015-12-09: 12.5 mg via INTRAVENOUS
  Filled 2015-12-08: qty 1

## 2015-12-08 MED ORDER — INSULIN ASPART 100 UNIT/ML ~~LOC~~ SOLN
0.0000 [IU] | SUBCUTANEOUS | Status: DC
  Administered 2015-12-09: 3 [IU] via SUBCUTANEOUS
  Administered 2015-12-09: 5 [IU] via SUBCUTANEOUS
  Administered 2015-12-09: 2 [IU] via SUBCUTANEOUS
  Administered 2015-12-09: 3 [IU] via SUBCUTANEOUS

## 2015-12-08 NOTE — Progress Notes (Signed)
Report rec'd from 29m. Awaiting patient's arrival.

## 2015-12-08 NOTE — Progress Notes (Signed)
[facility] PLAN OF CARE NOTE Patient: [name] [name] [id number]   PCP: [name] Fraction, MD DOB: 1926/08/16   DOA: 12/02/2015   DOS: 12/08/2015    Plan of care: Goals of care discussion with daughter and power of attorney  The patient has been progressively getting lethargic. His respiratory status has also been progressively worsening. Repeat chest x-ray this morning shows vascular congestion. Patient requires D5 at 100 mL/h at least to improve patient's sodium level. Patient is also developing hyperglycemia secondary to steroids as well as D5.  With this progressive worsening, I discussed with neurology who recommended to reduce the dose of Depakote to 250 mg every 12 hours and an EEG in the morning.  I discussed with patient's POA Ms. [name] [name], and after discussion she referred me to patient's daughter Ms. [name] [name] on [phone].  I explained patient's current condition, hospital course, progressively worsening and overall guarded prognosis. Patient's daughter at present told me, that her father has told her to at least give an opportunity for treatment and would like to see a trial of BiPAP if that improves patient's respiration. If the BiPAP is making the patient anxious, uncomfortable or agitated or causing pain she would like to rediscuss the plan of care and would like to keep the patient comfortable.  I explained that even if the patient gets better through this hospitalization his overall prognosis is guarded, the daughter agrees and agrees with referral to palliative care for hospice. I discussed with palliative care as well who will follow up with the patient in the morning.  Patient will be transferred to step down unit for closer monitoring. Continue with D5 at 100 mL per hour. BiPAP when necessary for respiratory distress. Reduce the dose of Depakote to 250 mg every 12 hours. Repeat EEG in the morning. Neurology and palliative care to follow the patient in the  morning. In future, the daughter would like to call her first on her number to avoid [place] repeating the discussion and POA will be informed by the daughter as well.  Cimini,Pat- [phone].  Author: Berle Mull, MD [facility] Pager: [phone] 12/08/2015 6:40 PM   If 7PM-7AM, please contact night-coverage at [email or web address], password [facility]

## 2015-12-08 NOTE — Progress Notes (Signed)
Attempted report x1 to 2C 

## 2015-12-08 NOTE — Progress Notes (Signed)
CRITICAL VALUE ALERT  Critical value received:  Lactic Acid 3.3   Date of notification:  12/08/15  Time of notification:  2305  Critical value read back:Yes.    Nurse who received alert:  Nani Ravens RN  MD notified (1st page):  Baltazar Najjar NP  Time of first page:    MD notified (2nd page):  Time of second page:  Responding MD:    Time MD responded:

## 2015-12-08 NOTE — Progress Notes (Signed)
Rapid response aware of patient's status and transfer request to stepdown. Respiratory paged regarding bipap. Per RR, patient to have bed shortly, per respiratory, respiratory to put on bipap when in iicu

## 2015-12-08 NOTE — Progress Notes (Signed)
Patient has blisters and lesion from his thighs to his head including the inside of the oral cavity. Some of the blistered are open on the penis, right arm and right shoulder. CHG bath completed and [name] Najjar NP updated with new orders for wound consult. Also patient's MAP is less than 60 Kirby NP via text page has been updated.

## 2015-12-08 NOTE — Progress Notes (Signed)
[facility] Progress Note  Patient: [name] [name] [id number]   PCP: [name] Fraction, MD DOB: 1926/06/04   DOA: 12/02/2015   DOS: 12/08/2015   Date of Service: the patient was seen and examined on 12/08/2015  Subjective: Patient appears to be more confused and lethargic this morning. Unable to answer questions appropriately this morning. No other acute events noted overnight. Nutrition: Progressively getting more lethargic and therefore minimal oral intake  Brief hospital course: Patient was admitted on 12/02/2015, with complaint of a seizure episode associated with some right-sided weakness at the SNF, was found to have recurrent sepsis. Patient recently hospitalized for V. fib arrest requiring intubation followed by acute respiratory failure. Patient also had persistent dysphagia the time of the recent discharge. During the course initially the patient was started on broad-spectrum antibiotics and antibiotic have been narrowed down. Blood cultures remain negative. Chest x-ray shows possible pneumonia. Patient has been started on Depakote and that has been no active seizures, EEG and so repeat CT scans are also unremarkable but developing progressive lethargy. Progressive dysphagia, speech therapy is following. Currently further plan is continue IV antibiotics, follow neurology recommendation and if does not show any significant improvement need to discuss with family regarding palliative care consult.  Assessment and Plan: 1. Sepsis ([place])  Acute hypoxic respiratory failure. COPD exacerbation. Right basal healthcare associated pneumonia, likely aspiration Blood cultures are negative at present. No specimen available sputum cultures. Leukocytosis improving, hypoxia remains the same, repeat ABG does not show any significant hypercarbia or acidosis. Mental status is worsening Vancomycin was discontinued due to recent MRSA PCR negative and negative blood culture. Cefepime was  discontinued with the concern for possible CNS toxicity. Clindamycin was added but later on was discontinued due to recent history of gram-positive strep viridans. Ceftriaxone and Flagyl was added to cover for strep. Viridans as well as aspiration, Continue Mucinex and flutter device, continue breathing treatments Tapered down steroids  2. New onset seizure. Likely metabolic in nature from hypoxia. Metabolic encephalopathy EEG shows no evidence of epileptogenic focus. Neurology recommends discontinuing Aricept. Patient has been on Dilantin since admission 500 every 12 hours. Repeat CT scan 2 does not show any evidence of acute intracranial abnormality. Neurology following, Depakote level 52 therapeutic RPR negative, recent 0000000 and folic acid level therapeutic  Check ammonia, TSH  3. Dysphagia.  Patient is having more difficulty swallowing due to progressive lethargy Appears more lethargic this morning. Modified barium swallow today suggest dysphagia Diet. Continue Speech Therapy Currently nothing by mouth until more awake and alert and following command  4. Permanent atrial fibrillation, Mobitz type II AV block,  pacemaker implant. Coronary artery disease.  Essential hypertension Recent NSVT, V. fib arrest Continue amiodarone. Continue Xarelto. Rate is well controlled at present.  5. Chronic systolic CHF. Ejection [name] earlier in March 2017 25-30%., Repeat echocardiogram shows improvement in ejection [name] 40-45%. Global hypokinesis. Patient was on Lasix which is currently on hold due to sepsis and lactic acidosis. Check BNP  6. Elevated troponin. demand ischemia. Long QTC Persistent chest pain, repeated EKG nonischemic. Limited echocardiogram shows improvement in ejection [name] without any wall motion abnormality. EKG continues to show prolonged QTC  avoid QT prolonging medications  7. Chronic kidney disease stage III. Serum creatinine back to  baseline. We will monitor closely.  8. Pressure ulcer. Bilateral buttocks unstageable Frequent turns and monitoring, continue barrier cream  9. Severe protein calorie malnutrition. Continue ensure  10. Hypernatremia. Sodium level elevated. The patient does not drink any water  by mouth, as he does not prefer the thickened water. Which is most likely the cause for hypernatremia started on D5 at 50 mL per hour. Gradually improving.  11. Steroid-induced hyperglycemia. On sliding scale insulin. Tapering down steroids.  12. Papular rash in the groin area. Negative RPR. Blood cultures are negative. Continue IV antibiotics  Activity: physical therapy consulted Bowel regimen: last BM 12/06/2015 DVT Prophylaxis: on therapeutic anticoagulation. Nutrition: At present nothing by mouth until completely awake Advance goals of care discussion: DNR/DNI  HPI: As per the H and P dictated on admission, "[name] [name] is a 82 y.o. male who was recently admitted for acute respiratory failure requiring intubation and has had V. fib arrest was brought to the ER from the nursing home after patient was found to have a seizure and right-sided weakness. CT of the head did not show any acute. 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 Patient has chronic leukocytosis and patient ` yes and was found to be elevated with patient being mildly febrile and chest x-ray showing infiltrate patient is started on IV fluids antibiotics for pneumonia and admitted for further management. Patient on my exam is confused and oriented to his name only. Unable to move his right extremities. He had physician as discussed with patient's sister who has confirmed DO NOT RESUSCITATE status. " Procedures: Limited echocardiogram, EEG Consultants: Neurology Antibiotics: Anti-infectives    Start     Dose/Rate Route Frequency  Ordered Stop   12/08/15 0900  metroNIDAZOLE (FLAGYL) IVPB 500 mg     500 mg 100 mL/hr over 60 Minutes Intravenous Every 8 hours 12/08/15 0844     12/07/15 1500  clindamycin (CLEOCIN) IVPB 600 mg  Status:  Discontinued     600 mg 100 mL/hr over 30 Minutes Intravenous Every 8 hours 12/07/15 1409 12/08/15 0836   12/04/15 2300  vancomycin (VANCOCIN) 1,250 mg in sodium chloride 0.9 % 250 mL IVPB  Status:  Discontinued     1,250 mg 166.7 mL/hr over 90 Minutes Intravenous Every 24 hours 12/04/15 1042 12/05/15 0802   12/04/15 0200  vancomycin (VANCOCIN) IVPB 750 mg/150 ml premix  Status:  Discontinued     750 mg 150 mL/hr over 60 Minutes Intravenous Every 24 hours 12/03/15 0434 12/04/15 1042   12/03/15 2200  ceFEPIme (MAXIPIME) 1 g in dextrose 5 % 50 mL IVPB  Status:  Discontinued     1 g 100 mL/hr over 30 Minutes Intravenous Every 24 hours 12/03/15 1115 12/07/15 1407   12/03/15 0600  aztreonam (AZACTAM) 500 mg in dextrose 5 % 50 mL IVPB  Status:  Discontinued     500 mg 100 mL/hr over 30 Minutes Intravenous 3 times per day 12/03/15 0434 12/03/15 1100   12/03/15 0430  aztreonam (AZACTAM) 2 g in dextrose 5 % 50 mL IVPB  Status:  Discontinued     2 g 100 mL/hr over 30 Minutes Intravenous  Once 12/03/15 0426 12/03/15 0427   12/03/15 0430  vancomycin (VANCOCIN) IVPB 1000 mg/200 mL premix  Status:  Discontinued     1,000 mg 200 mL/hr over 60 Minutes Intravenous  Once 12/03/15 0426 12/03/15 0427   12/03/15 0145  vancomycin (VANCOCIN) IVPB 1000 mg/200 mL premix     1,000 mg 200 mL/hr over 60 Minutes Intravenous  Once 12/03/15 0135 12/03/15 0313   12/03/15 0145  ceFEPIme (MAXIPIME) 2 g in dextrose 5 % 50 mL IVPB  2 g 100 mL/hr over 30 Minutes Intravenous  Once 12/03/15 0139 12/03/15 0240       Family Communication: no family was present at bedside, at the time of interview.   Disposition:  Barriers to safe discharge: Improvement in mental status as well as sepsis physiology   Intake/Output  Summary (Last 24 hours) at 12/08/15 0921 Last data filed at 12/07/15 1457  Gross per 24 hour  Intake      0 ml  Output      0 ml  Net      0 ml   Filed Weights   12/02/15 2357 12/03/15 0400  Weight: 72 kg (158 lb 11.7 oz) 65.9 kg (145 lb 4.5 oz)    Objective: Physical Exam: Filed Vitals:   12/08/15 0114 12/08/15 0212 12/08/15 0443 12/08/15 0710  BP:  121/57 118/51   Pulse:  77 94   Temp:  97.5 F (36.4 C) 97.8 F (36.6 C)   TempSrc:  Axillary Axillary   Resp:  18 16   Height:      Weight:      SpO2: 98% 96% 98% 96%     General: Appear in marked distress, nodular and papular Rash; Oral Mucosa dry. Cardiovascular: S1 and S2 Present, aortic systolic Murmur, difficult to assess JVD Respiratory: Bilateral Air entry present and bilateral basal Crackles, occasional expiratory wheezes Abdomen: Bowel Sound present, Soft and no tenderness Extremities: trace Pedal edema, no calf tenderness Neurology: Progressive lethargy but no focal deficit  Data Reviewed: CBC:  Recent Labs Lab 12/02/15 2340  12/03/15 0515 12/04/15 0930 12/05/15 0359 12/06/15 0536 12/07/15 0257 12/08/15 0436  WBC 21.9*  --  20.0* 16.7* 15.1* 12.4* 13.5* 15.4*  NEUTROABS 10.5*  --  8.2* 6.5 4.7  --  4.6  --   HGB 11.3*  < > 8.9* 9.0* 9.9* 10.3* 9.4* 8.4*  HCT 36.1*  < > 28.7* 28.6* 31.8* 33.1* 30.2* 28.5*  MCV 91.6  --  92.6 91.1 91.1 92.2 91.5 92.5  PLT 134*  --  108* 105* 94* 109* 95* 100*  < > = values in this interval not displayed. Basic Metabolic Panel:  Recent Labs Lab 12/04/15 0930 12/05/15 0359 12/06/15 0536 12/07/15 0257 12/07/15 2048  NA 146* 147* 149* 148* 147*  K 3.3* 3.9 3.4* 4.2 4.6  CL 110 110 106 111 110  CO2 27 28 29 26 28   GLUCOSE 147* 128* 191* 319* 260*  BUN 35* 29* 28* 37* 38*  CREATININE 1.66* 1.45* 1.40* 1.50* 1.54*  CALCIUM 8.8* 8.9 9.5 9.6 9.3  MG 2.1  --   --   --   --    Liver Function Tests:  Recent Labs Lab 12/03/15 0515 12/04/15 0930 12/05/15 0359  12/07/15 0257 12/07/15 2048  AST 21 20 24 25 20   ALT 13* 15* 17 22 20   ALKPHOS 100 113 116 120 107  BILITOT 0.8 0.8 0.9 0.4 0.5  PROT 5.2* 5.3* 5.2* 5.1* 4.8*  ALBUMIN 2.0* 2.0* 1.9* 2.1* 2.0*   No results for input(s): LIPASE, AMYLASE in the last 168 hours. No results for input(s): AMMONIA in the last 168 hours.  Cardiac Enzymes:  Recent Labs Lab 12/04/15 0045 12/04/15 0930 12/06/15 0906 12/06/15 1524 12/06/15 2048  TROPONINI 0.07* 0.10* 0.04* 0.04* 0.04*    BNP (last 3 results)  Recent Labs  11/10/15 1456 11/16/15 0353 12/02/15 2345  BNP 1792.8* 406.2* 488.1*    CBG:  Recent Labs Lab 12/06/15 0412 12/07/15 1217 12/07/15 1731 12/07/15  2137 12/08/15 0617  GLUCAP 177* 241* 307* 254* 190*    Recent Results (from the past 240 hour(s))  Blood culture (routine x 2)     Status: None (Preliminary result)   Collection Time: 12/03/15 12:15 AM  Result Value Ref Range Status   Specimen Description BLOOD RIGHT ARM  Final   Special Requests BOTTLES DRAWN AEROBIC AND ANAEROBIC 5ML  Final   Culture NO GROWTH 4 DAYS  Final   Report Status PENDING  Incomplete  Blood culture (routine x 2)     Status: None (Preliminary result)   Collection Time: 12/03/15 12:45 AM  Result Value Ref Range Status   Specimen Description BLOOD RIGHT FOREARM  Final   Special Requests BOTTLES DRAWN AEROBIC AND ANAEROBIC 5ML  Final   Culture NO GROWTH 4 DAYS  Final   Report Status PENDING  Incomplete     Studies: Ct Head Wo Contrast  12/07/2015  CLINICAL DATA:  Acute encephalopathy EXAM: CT HEAD WITHOUT CONTRAST TECHNIQUE: Contiguous axial images were obtained from the base of the skull through the vertex without intravenous contrast. COMPARISON:  12/02/2015 FINDINGS: No intracranial hemorrhage, mass effect or midline shift. Stable cerebral atrophy. Ventricular size is stable from prior exam. Stable periventricular and patchy subcortical chronic white matter disease. Small lacunar infarct in  left thalamus is stable. Stable old infarcts in right frontal lobe and left parietal lobe high convexity axial image 23. No definite acute cortical infarction. No mass lesion is noted on this unenhanced scan. No intraventricular hemorrhage. No skull fracture is noted. Paranasal sinuses and mastoid air cells are unremarkable. Atherosclerotic calcifications of carotid siphon and vertebral arteries again noted. IMPRESSION: 1. No acute intracranial abnormality. Stable atrophy and chronic white matter disease. Stable old infarcts and described above. No definite acute cortical infarction. No mass lesion is noted on this unenhanced scan. No intraventricular hemorrhage. Electronically Signed   By: Lahoma Crocker M.D.   On: 12/07/2015 16:57   Dg Chest Port 1 View  12/08/2015  CLINICAL DATA:  Shortness of breath, fever.  Question pneumonia EXAM: PORTABLE CHEST 1 VIEW COMPARISON:  12/02/2015 FINDINGS: Left pacer remains in place, unchanged. Prior CABG. Cardiomegaly. Small bilateral pleural effusions. Mild vascular congestion. Bibasilar opacities are noted, right greater than left which could represent edema or infection. These are similar to prior study. IMPRESSION: Cardiomegaly with vascular congestion and small effusions. Bibasilar edema or infiltrates, right slightly greater than left. No real change. Electronically Signed   By: Rolm Baptise M.D.   On: 12/08/2015 09:00     Scheduled Meds: . amiodarone  200 mg Oral Daily  . atorvastatin  10 mg Oral Daily  . doxazosin  1 mg Oral Daily  . feeding supplement (ENSURE ENLIVE)  237 mL Oral BID BM  . fluticasone  1 spray Each Nare Daily  . guaiFENesin  10 mL Oral TID PC & HS  . insulin aspart  0-5 Units Subcutaneous QHS  . insulin aspart  0-9 Units Subcutaneous TID WC  . ipratropium-albuterol  3 mL Nebulization QID  . methylPREDNISolone (SOLU-MEDROL) injection  60 mg Intravenous Daily  . metronidazole  500 mg Intravenous Q8H  . RESOURCE THICKENUP CLEAR   Oral Once  .  rivaroxaban  15 mg Oral Q supper  . valproate sodium  500 mg Intravenous Q12H   Continuous Infusions: . dextrose 50 mL/hr at 12/07/15 1041   PRN Meds: albuterol  Time spent: 30 minutes  Author: Berle Mull, MD [facility] Pager: [phone] 12/08/2015 9:21 AM  If 7PM-7AM, please contact night-coverage at [email or web address], password [facility]

## 2015-12-08 NOTE — Progress Notes (Signed)
PT Cancellation Note  Patient Details Name: DELVON AMBROISE MRN: [id number] DOB: [birth year]   Cancelled Treatment:    Reason Eval/Treat Not Completed: Medical issues which prohibited therapy;Patient's level of consciousness   Patient lethargic with labored breathing. Attempted to arouse patient with no improvement. Per RN, he has had periods today when he was more alert. Spoke with Glendon Axe, LCSW re: pt residing at [facility] prior to admission. She stated patient was there as a short-term rehab patient and will need therapy evaluations to return to SNF.  Noted MD ?'g if Palliative care may be the appropriate course. Will follow-up 4/5 to determine if PT evaluation is appropriate.   Brysun Eschmann 12/08/2015, 3:54 PM  Pager [phone]

## 2015-12-08 NOTE — Progress Notes (Signed)
Requested bed for stepdown unit per md order

## 2015-12-08 NOTE — Clinical Social Work Note (Signed)
Patient admitted from [facility] and plans to return at d/c. Facility confirmed.   CSW remains available as needed.   Glendon Axe, MSW, LCSWA [phone] 12/08/2015 10:27 AM

## 2015-12-08 NOTE — Progress Notes (Signed)
Placed patient on BiPAP on arrival to the stepdown unit. Patient had increased WOB and now the patient is tolerating the BIPAP well. RT will continue to monitor

## 2015-12-08 NOTE — Progress Notes (Signed)
I was just informed by the unit secretary, Levada Dy,  that 81M was attempting report. I just received my assigned phone [phone] from the secretary and finished bedside report with the off going shift. Will call for report now.

## 2015-12-09 ENCOUNTER — Inpatient Hospital Stay (HOSPITAL_COMMUNITY): Payer: Medicare Other

## 2015-12-09 DIAGNOSIS — R569 Unspecified convulsions: Secondary | ICD-10-CM

## 2015-12-09 DIAGNOSIS — J189 Pneumonia, unspecified organism: Secondary | ICD-10-CM | POA: Diagnosis present

## 2015-12-09 DIAGNOSIS — R0602 Shortness of breath: Secondary | ICD-10-CM | POA: Diagnosis present

## 2015-12-09 DIAGNOSIS — Z515 Encounter for palliative care: Secondary | ICD-10-CM | POA: Diagnosis present

## 2015-12-09 DIAGNOSIS — Z7189 Other specified counseling: Secondary | ICD-10-CM | POA: Diagnosis present

## 2015-12-09 DIAGNOSIS — L511 Stevens-Johnson syndrome: Secondary | ICD-10-CM

## 2015-12-09 LAB — CBC
HEMATOCRIT: 29.2 % — AB (ref 39.0–52.0)
Hemoglobin: 8.7 g/dL — ABNORMAL LOW (ref 13.0–17.0)
MCH: 27.6 pg (ref 26.0–34.0)
MCHC: 29.8 g/dL — ABNORMAL LOW (ref 30.0–36.0)
MCV: 92.7 fL (ref 78.0–100.0)
PLATELETS: 109 10*3/uL — AB (ref 150–400)
RBC: 3.15 MIL/uL — ABNORMAL LOW (ref 4.22–5.81)
RDW: 19.3 % — AB (ref 11.5–15.5)
WBC: 17.7 10*3/uL — AB (ref 4.0–10.5)

## 2015-12-09 LAB — GLUCOSE, CAPILLARY
GLUCOSE-CAPILLARY: 197 mg/dL — AB (ref 65–99)
Glucose-Capillary: 124 mg/dL — ABNORMAL HIGH (ref 65–99)
Glucose-Capillary: 159 mg/dL — ABNORMAL HIGH (ref 65–99)
Glucose-Capillary: 191 mg/dL — ABNORMAL HIGH (ref 65–99)
Glucose-Capillary: 202 mg/dL — ABNORMAL HIGH (ref 65–99)

## 2015-12-09 LAB — SODIUM
SODIUM: 147 mmol/L — AB (ref 135–145)
SODIUM: 144 mmol/L (ref 135–145)
Sodium: 149 mmol/L — ABNORMAL HIGH (ref 135–145)
Sodium: 148 mmol/L — ABNORMAL HIGH (ref 135–145)
Sodium: 149 mmol/L — ABNORMAL HIGH (ref 135–145)

## 2015-12-09 LAB — LACTIC ACID, PLASMA: Lactic Acid, Venous: 3.1 mmol/L (ref 0.5–2.0)

## 2015-12-09 LAB — URINE CULTURE: CULTURE: NO GROWTH

## 2015-12-09 MED ORDER — GLYCOPYRROLATE 0.2 MG/ML IJ SOLN
0.4000 mg | INTRAMUSCULAR | Status: DC | PRN
  Filled 2015-12-09: qty 2

## 2015-12-09 MED ORDER — MORPHINE SULFATE (PF) 2 MG/ML IV SOLN
2.0000 mg | INTRAVENOUS | Status: DC | PRN
  Administered 2015-12-10 – 2015-12-11 (×3): 2 mg via INTRAVENOUS
  Filled 2015-12-09 (×3): qty 1

## 2015-12-09 MED ORDER — CLOBETASOL PROPIONATE 0.05 % EX CREA
TOPICAL_CREAM | Freq: Two times a day (BID) | CUTANEOUS | Status: DC
  Administered 2015-12-09 – 2015-12-10 (×3): 1 via TOPICAL
  Filled 2015-12-09: qty 15

## 2015-12-09 MED ORDER — RESOURCE THICKENUP CLEAR PO POWD
ORAL | Status: DC | PRN
  Filled 2015-12-09: qty 125

## 2015-12-09 MED ORDER — ALBUTEROL SULFATE (2.5 MG/3ML) 0.083% IN NEBU
3.0000 mL | INHALATION_SOLUTION | RESPIRATORY_TRACT | Status: DC | PRN
  Administered 2015-12-10 – 2015-12-11 (×2): 3 mL via RESPIRATORY_TRACT
  Filled 2015-12-09 (×2): qty 3

## 2015-12-09 MED ORDER — VALPROATE SODIUM 500 MG/5ML IV SOLN
250.0000 mg | Freq: Two times a day (BID) | INTRAVENOUS | Status: DC
  Filled 2015-12-09: qty 2.5

## 2015-12-09 MED ORDER — SODIUM CHLORIDE 0.45 % IV BOLUS
500.0000 mL | Freq: Once | INTRAVENOUS | Status: AC
  Administered 2015-12-09: 500 mL via INTRAVENOUS

## 2015-12-09 MED ORDER — CETYLPYRIDINIUM CHLORIDE 0.05 % MT LIQD
7.0000 mL | Freq: Two times a day (BID) | OROMUCOSAL | Status: DC
  Administered 2015-12-09 – 2015-12-11 (×4): 7 mL via OROMUCOSAL

## 2015-12-09 MED ORDER — CHLORHEXIDINE GLUCONATE 0.12 % MT SOLN
15.0000 mL | Freq: Two times a day (BID) | OROMUCOSAL | Status: DC
  Administered 2015-12-09 – 2015-12-10 (×3): 15 mL via OROMUCOSAL
  Filled 2015-12-09 (×2): qty 15

## 2015-12-09 NOTE — Progress Notes (Signed)
CRITICAL VALUE ALERT  Critical value received:  Lactic Acid 3.1  Date of notification:  12/09/15  Time of notification:  0215  Critical value read back:Yes.    Nurse who received alert:  Nani Ravens RN  MD notified (1st page):  Baltazar Najjar NP  Time of first page:    MD notified (2nd page):  Time of second page:  Responding MD:    Time MD responded:

## 2015-12-09 NOTE — Consult Note (Signed)
Consultation Note Date: 12/09/2015   Patient Name: [name] [name]  DOB: [birth year]  MRN: [id number]  Age / Sex: 82 y.o., male  PCP: Susy Frizzle, MD Referring Physician: Cherene Altes, MD  Reason for Consultation: Establishing goals of care    Clinical Assessment/Narrative:  82 year old gentleman with a past medical history significant for recent ventricular fibrillation arrest. Patient has been admitted from his skilled nursing facility on 3-29 with seizure episode. He is status post recent acute respiratory failure. He has had persistent dysphagia and possible ongoing aspiration. Over the course of the past 24 hours patient has had increasing oxygen requirements and progressive lethargy. Neurology has been reconsulted, EEG and repeat CT scans with nonspecific changes.  Initial goals of care discussions undertaken by hospitalist Dr. Posey Pronto with the patient's daughter on 12-08-15. Patient was subsequently transferred to stepdown, brief trial of current therapies and palliative consultation was decided.  Patient continues to have progressive deterioration clinically. He does not verbalize much remains noncommunicative. He is on nonrebreather mask. He does not appear to be comfortable. He is laying sideways in bed. Presently, there is no family present at the bedside. Patient remains admitted for sepsis due to right lower lobe pneumonia, likely aspiration. Hospital course also complicated by development of diffuse bullous rash, has ongoing metabolic encephalopathy also with history of underlying chronic obstructive pulmonary disease. Of note, patient has history of hypertension, atrial fibrillation, heart block is status post permanent pacemaker placement. He has been unable to take his oral anticoagulation because of dysphagia in this hospitalization.  Call placed and discussed with the patient's daughter who is in [place]  at [phone]. She describes the patient as somebody who always like to be in control and make his own decisions. His wife is deceased. He was living with his sister who lives locally. He has 2 daughters. Although his sister is listed as healthcare power of attorney agent, she defers to the patient's daughter who is in [place]. Patient's daughter is thankful for the information she has received from Dr. Posey Pronto on 12-08-15. She is aware of the patient's ongoing decline. Discussed about comfort measures only. Discussed about discontinuing unnecessary medications. Discussed about use of judicious opioid such as morphine for pain and shortness of breath. She states that she is well aware with hospice philosophy as well. She states that her mother-in-law passed away in hospice care less than a week ago. Therefore, she states her and her husband are trying to come to [place] as soon as possible but have not been able to thus far. She is agreeable to hospice consultation and comfort measures.  Thank you for the consult, recommendations as listed below.  Contacts/Participants in Discussion: Primary Decision Maker:     Relationship to Patient   HCPOA: yes     SUMMARY OF RECOMMENDATIONS: DO NOT RESUSCITATE/DO NOT INTUBATE Do not escalate care, do not use BiPAP As needed morphine IV for shortness of breath and pain, distress Discontinue by mouth medications that the patient is not able to take such as her also, amiodarone. Full scope of comfort measures only Social worker consult for residential hospice placement Discontinue routine labs Discontinue IV fluids. Prognosis hours-days discussed with daughter over the phone. As mentioned above, she is trying to come as soon as possible.  Code Status/Advance Care Planning: DNR    Code Status Orders        Start     Ordered   12/03/15 0424  Do not attempt resuscitation (DNR)  Continuous    Question Answer Comment  In the event of cardiac or  respiratory ARREST Do not call a "code blue"   In the event of cardiac or respiratory ARREST Do not perform Intubation, CPR, defibrillation or ACLS   In the event of cardiac or respiratory ARREST Use medication by any route, position, wound care, and other measures to relive pain and suffering. May use oxygen, suction and manual treatment of airway obstruction as needed for comfort.      12/03/15 0426    Code Status History    Date Active Date Inactive Code Status Order ID Comments User Context   11/18/2015 10:01 AM 12/02/2015  2:23 AM DNR [id number]  Raylene Miyamoto, MD Inpatient   11/10/2015  1:06 PM 11/18/2015 10:01 AM Full Code [id number]  Rondel Jumbo, PA-C ED   09/05/2012  4:48 AM 09/07/2012  6:50 PM Full Code [id number]  Brantley Persons, RN Inpatient    Advance Directive Documentation        Most Recent Value   Type of Advance Directive  Living will, Healthcare Power of Attorney   Pre-existing out of facility DNR order (yellow form or pink MOST form)     "MOST" Form in Place?        Other Directives:Advanced Directive  Symptom Management:      Palliative Prophylaxis:   Delirium Protocol  Additional Recommendations (Limitations, Scope, Preferences):  Avoid Hospitalization and Full Comfort Care     Psycho-social/Spiritual:  Support System: [place] Desire for further Chaplaincy support:no Additional Recommendations: Education on Hospice  Prognosis: Hours - Days  Discharge Planning: Hospice facility   Chief Complaint/ Primary Diagnoses: Present on Admission:  . Sepsis ([place]) . Permanent atrial fibrillation ([place]) . Mobitz type II atrioventricular block . HYPERTENSION, BENIGN . CORONARY ATHEROSCLEROSIS, ARTERY BYPASS GRAFT . Protein-calorie malnutrition, severe ([place]) . Hypothyroidism . Coronary atherosclerosis . Pacemaker-[employer] . Pressure ulcer  I have reviewed the medical record, interviewed the patient and family, and examined the patient. The following  aspects are pertinent.  Past Medical History  Diagnosis Date  . CAD (coronary artery disease)   . Hypertension   . Vitamin D deficiency   . Chronic back pain   . Anemia   . ED (erectile dysfunction)   . Arthritis   . RLS (restless legs syndrome)   . GERD (gastroesophageal reflux disease)   . COPD (chronic obstructive pulmonary disease) ([place])   . Hyperlipidemia   . Atrial fibrillation ([place])   . Second degree Mobitz II AV block     s/p PPM  . Asthma 09/15/2011  . Long term current use of anticoagulant 09/15/2011  . Thrombocytopenia ([place]) 09/15/2011  . Lymphocytosis   . CHF (congestive heart failure) ([place]) 01/01/2013  . Allergy    Social History   Social History  . Marital Status: Widowed    Spouse Name: N/A  . Number of Children: 2  . Years of Education: N/A   Occupational History  .      retired Hydrologist   Social History Main Topics  . Smoking status: Former Smoker -- 3.00 packs/day for 80 years    Types: Cigarettes    Quit date: 09/05/2008  . Smokeless tobacco: Never Used  . Alcohol Use: No     Comment: occasional 4 drinks per week  . Drug Use: No  . Sexual Activity: Not Currently   Other Topics Concern  . None   Social History Narrative   Family History  Problem Relation  Age of Onset  . Diabetes Sister   . Cancer Mother     lung cancer  . Stroke Father   . Cancer Brother     brain cancer  . Alcohol abuse Brother    Scheduled Meds: . antiseptic oral rinse  7 mL Mouth Rinse q12n4p  . chlorhexidine  15 mL Mouth Rinse BID  . clobetasol cream   Topical BID  . ipratropium-albuterol  3 mL Nebulization QID  . valproate sodium  500 mg Intravenous Q12H   Continuous Infusions:  PRN Meds:.albuterol, glycopyrrolate, morphine injection, RESOURCE THICKENUP CLEAR Medications Prior to Admission:  Prior to Admission medications   Medication Sig Start Date End Date Taking? Authorizing Provider  albuterol (PROVENTIL HFA;VENTOLIN HFA) 108 (90 BASE) MCG/ACT  inhaler Inhale 2 puffs into the lungs every 6 (six) hours as needed. For wheezing or shortness of breath 07/07/14  Yes Susy Frizzle, MD  Amino Acids-Protein Hydrolys (FEEDING SUPPLEMENT, PRO-STAT SUGAR FREE 64,) LIQD Take 30 mLs by mouth 2 (two) times daily. 11/29/15  Yes Florencia Reasons, MD  amiodarone (PACERONE) 200 MG tablet Take 1 tablet (200 mg total) by mouth daily. 11/29/15  Yes Florencia Reasons, MD  arformoterol (BROVANA) 15 MCG/2ML NEBU Take 2 mLs (15 mcg total) by nebulization 2 (two) times daily. 11/29/15  Yes Florencia Reasons, MD  atorvastatin (LIPITOR) 10 MG tablet Take 1 tablet (10 mg total) by mouth daily. 11/29/15  Yes Florencia Reasons, MD  budesonide (PULMICORT) 0.25 MG/2ML nebulizer solution Take 2 mLs (0.25 mg total) by nebulization 2 (two) times daily. 11/29/15  Yes Florencia Reasons, MD  donepezil (ARICEPT) 5 MG tablet Take 1 tablet (5 mg total) by mouth at bedtime. 01/09/15  Yes Susy Frizzle, MD  FLONASE 50 MCG/ACT nasal spray Place 1 spray into both nostrils daily. 01/12/15 01/12/16 Yes Earleen Newport, MD  furosemide (LASIX) 40 MG tablet Take 1 tablet (40 mg total) by mouth daily. 11/18/14  Yes Deboraha Sprang, MD  guaiFENesin (MUCINEX) 600 MG 12 hr tablet Take 1 tablet (600 mg total) by mouth 2 (two) times daily. 11/29/15  Yes Florencia Reasons, MD  Multiple Vitamin (MULTIVITAMIN) tablet Take 1 tablet by mouth every morning.    Yes Historical Provider, MD  polyethylene glycol (MIRALAX / GLYCOLAX) packet Take 17 g by mouth daily. 11/29/15  Yes Florencia Reasons, MD  senna-docusate (SENOKOT-S) 8.6-50 MG tablet Take 1 tablet by mouth at bedtime. 11/29/15  Yes Florencia Reasons, MD  tamsulosin (FLOMAX) 0.4 MG CAPS capsule TAKE ONE CAPSULE BY MOUTH ONCE DAILY 04/27/15  Yes Susy Frizzle, MD  XARELTO 15 MG TABS tablet TAKE ONE TABLET BY MOUTH ONCE DAILY WITH SUPPER 08/05/15  Yes Deboraha Sprang, MD   Allergies  Allergen Reactions  . Amoxicillin Itching and Rash  . Penicillins Itching and Rash    Has patient had a PCN reaction causing immediate rash,  facial/tongue/throat swelling, SOB or lightheadedness with hypotension: Yes Has patient had a PCN reaction causing severe rash involving mucus membranes or skin necrosis: No Has patient had a PCN reaction that required hospitalization No Has patient had a PCN reaction occurring within the last 10 years: No If all of the above answers are "NO", then may proceed with Cephalosporin use.     Review of Systems Is nonverbal Physical Exam Weak cachectic elderly man lying in bed Nonrebreather mask on Coarse rhonchorous breath sounds S1-S2 Abdomen soft Extremities with wasting Does not open eyes does not respond is nonverbal Vital Signs: BP 105/86  mmHg  Pulse 66  Temp(Src) 99.6 F (37.6 C) (Axillary)  Resp 17  Ht 5\' 11"  (1.803 m)  Wt 66.4 kg (146 lb 6.2 oz)  BMI 20.43 kg/m2  SpO2 98%  SpO2: SpO2: 98 % O2 Device:SpO2: 98 % O2 Flow Rate: .O2 Flow Rate (L/min): 4 L/min  IO: Intake/output summary:  Intake/Output Summary (Last 24 hours) at 12/09/15 1825 Last data filed at 12/09/15 0236  Gross per 24 hour  Intake   1255 ml  Output      1 ml  Net   1254 ml    LBM: Last BM Date: 12/06/15 Baseline Weight: Weight: 72 kg (158 lb 11.7 oz) Most recent weight: Weight: 66.4 kg (146 lb 6.2 oz)      Palliative Assessment/Data:  Flowsheet Rows        Most Recent Value   Intake Tab    Referral Department  Hospitalist   Unit at Time of Referral  ICU   Palliative Care Primary Diagnosis  Pulmonary   Palliative Care Type  New Palliative care   Reason for referral  Clarify Goals of Care, End of Life Care Assistance   Date first seen by Palliative Care  12/09/15   Clinical Assessment    Palliative Performance Scale Score  20%   Pain Max last 24 hours  5   Pain Min Last 24 hours  4   Dyspnea Max Last 24 Hours  4   Dyspnea Min Last 24 hours  3   Psychosocial & Spiritual Assessment    Palliative Care Outcomes    Patient/Family meeting held?  Yes   Who was at the meeting?  dtr over the  phone    Palliative Care Outcomes  Counseled regarding hospice   Palliative Care follow-up planned  Yes, Facility      Additional Data Reviewed:  CBC:    Component Value Date/Time   WBC 17.7* 12/09/2015 0416   WBC 14.8* 03/13/2014 1109   WBC 12.6* 02/07/2013 1413   HGB 8.7* 12/09/2015 0416   HGB 11.8* 03/13/2014 1109   HGB 13.0 02/07/2013 1413   HCT 29.2* 12/09/2015 0416   HCT 26.5* 11/26/2015 0520   HCT 36.7* 03/13/2014 1109   HCT 40.7 02/07/2013 1413   PLT 109* 12/09/2015 0416   PLT 66* 03/13/2014 1109   PLT 128* 02/07/2013 1413   MCV 92.7 12/09/2015 0416   MCV 97 03/13/2014 1109   MCV 91.9 02/07/2013 1413   NEUTROABS 4.6 12/07/2015 0257   NEUTROABS 3.6 02/07/2013 1413   LYMPHSABS 8.5* 12/07/2015 0257   LYMPHSABS 8.3* 02/07/2013 1413   MONOABS 0.4 12/07/2015 0257   MONOABS 0.6 02/07/2013 1413   EOSABS 0.0 12/07/2015 0257   EOSABS 0.1 02/07/2013 1413   BASOSABS 0.0 12/07/2015 0257   BASOSABS 0.0 02/07/2013 1413   Comprehensive Metabolic Panel:    Component Value Date/Time   NA 144 12/09/2015 1554   NA 146* 11/18/2014 1210   NA 143 03/13/2014 1109   K 4.4 12/08/2015 1018   K 3.6 03/13/2014 1109   CL 111 12/08/2015 1018   CL 107 03/13/2014 1109   CO2 31 12/08/2015 1018   CO2 30 03/13/2014 1109   BUN 36* 12/08/2015 1018   BUN 10 11/18/2014 1210   BUN 13 03/13/2014 1109   CREATININE 1.56* 12/08/2015 1018   CREATININE 1.33 01/15/2015 1637   CREATININE 1.17 03/13/2014 1109   GLUCOSE 138* 12/08/2015 1018   GLUCOSE 118* 11/18/2014 1210   GLUCOSE 119* 03/13/2014 1109  CALCIUM 9.3 12/08/2015 1018   CALCIUM 8.8 03/13/2014 1109   AST 19 12/08/2015 1018   AST 35 03/13/2014 1109   ALT 20 12/08/2015 1018   ALT 38 03/13/2014 1109   ALKPHOS 106 12/08/2015 1018   ALKPHOS 76 03/13/2014 1109   BILITOT 0.5 12/08/2015 1018   BILITOT 1.6* 03/13/2014 1109   PROT 5.0* 12/08/2015 1018   PROT 6.4 03/13/2014 1109   ALBUMIN 2.1* 12/08/2015 1018   ALBUMIN 3.5 03/13/2014  1109     Time In: 5pm Time Out: 630 Time Total: 90 min Greater than 50%  of this time was spent counseling and coordinating care related to the above assessment and plan.  Signed by: Loistine Chance, MD [id number] Loistine Chance, MD  12/09/2015, 6:25 PM  Please contact Palliative Medicine Team phone at [phone] for questions and concerns.

## 2015-12-09 NOTE — Progress Notes (Signed)
OT Cancellation Note  Patient Details Name: [name] [name] MRN: [id number] DOB: [birth year]   Cancelled Treatment:    Reason Eval/Treat Not Completed: Medical issues which prohibited therapy (Bipap/ Rn requesting to hold therapy until PM hours)  Vonita Moss   OTR/L Pager: [phone] Office: [phone] .  12/09/2015, 7:58 AM

## 2015-12-09 NOTE — Progress Notes (Signed)
Interval History:                                                                                                                      [name] [name] is an 82 y.o. male patient with  pneumonia and sepsis, continues to have worsening mental status changes. EEG repeated today showed evidence of mild to moderate encephalopathy. No seizures or abnormal discharges noted on EEG.  Depakote dose was reduced yesterday from 500 every 12 at 250 every 12 h.  It was initially started after a seizure earlier during the admission. Patient had not had any further clinical seizures. He is unable to provide any history due to mental status changes. No family at bedside.     Past Medical History: Past Medical History  Diagnosis Date  . CAD (coronary artery disease)   . Hypertension   . Vitamin D deficiency   . Chronic back pain   . Anemia   . ED (erectile dysfunction)   . Arthritis   . RLS (restless legs syndrome)   . GERD (gastroesophageal reflux disease)   . COPD (chronic obstructive pulmonary disease) ([place])   . Hyperlipidemia   . Atrial fibrillation ([place])   . Second degree Mobitz II AV block     s/p PPM  . Asthma 09/15/2011  . Long term current use of anticoagulant 09/15/2011  . Thrombocytopenia ([place]) 09/15/2011  . Lymphocytosis   . CHF (congestive heart failure) ([place]) 01/01/2013  . Allergy     Past Surgical History  Procedure Laterality Date  . Coronary artery bypass graft    . Cataract extraction    . Pacemaker insertion      Family History: Family History  Problem Relation Age of Onset  . Diabetes Sister   . Cancer Mother     lung cancer  . Stroke Father   . Cancer Brother     brain cancer  . Alcohol abuse Brother     Social History:   reports that he quit smoking about 7 years ago. His smoking use included Cigarettes. He has a 240 pack-year smoking history. He has never used smokeless tobacco. He reports that he does not drink alcohol or use illicit drugs.  Allergies:  Allergies   Allergen Reactions  . Amoxicillin Itching and Rash  . Penicillins Itching and Rash    Has patient had a PCN reaction causing immediate rash, facial/tongue/throat swelling, SOB or lightheadedness with hypotension: Yes Has patient had a PCN reaction causing severe rash involving mucus membranes or skin necrosis: No Has patient had a PCN reaction that required hospitalization No Has patient had a PCN reaction occurring within the last 10 years: No If all of the above answers are "NO", then may proceed with Cephalosporin use.      Medications:  Current facility-administered medications:  .  albuterol (PROVENTIL) (2.5 MG/3ML) 0.083% nebulizer solution 3 mL, 3 mL, Inhalation, Q2H PRN, Cherene Altes, MD .  antiseptic oral rinse (CPC / CETYLPYRIDINIUM CHLORIDE 0.05%) solution 7 mL, 7 mL, Mouth Rinse, q12n4p, Cherene Altes, MD, 7 mL at 12/09/15 1613 .  chlorhexidine (PERIDEX) 0.12 % solution 15 mL, 15 mL, Mouth Rinse, BID, Cherene Altes, MD .  clobetasol cream (TEMOVATE) 0.05 %, , Topical, BID, Cherene Altes, MD .  glycopyrrolate (ROBINUL) injection 0.4 mg, 0.4 mg, Intravenous, Q4H PRN, Loistine Chance, MD .  ipratropium-albuterol (DUONEB) 0.5-2.5 (3) MG/3ML nebulizer solution 3 mL, 3 mL, Nebulization, QID, Lavina Hamman, MD, 3 mL at 12/09/15 1946 .  morphine 2 MG/ML injection 2 mg, 2 mg, Intravenous, Q1H PRN, Loistine Chance, MD .  RESOURCE THICKENUP CLEAR, , Oral, PRN, Cherene Altes, MD .  valproate (DEPACON) 250 mg in dextrose 5 % 50 mL IVPB, 250 mg, Intravenous, Q12H, Cherene Altes, MD   Neurologic Examination:                                                                                                     Today's Vitals   12/09/15 1819 12/09/15 1822 12/09/15 1948 12/09/15 1949  BP:    100/61  Pulse: 66   73  Temp:    98.5 F (36.9 C)  TempSrc:     Oral  Resp: 19 17  24   Height:      Weight:      SpO2: 100% 98% 98% 98%  PainSc:       Drowsy, arousable to verbal commands. No gaze deviation or nystagmus noted no abnormal involuntary movements noted. He moves all 4 extremities to command.    Lab Results: Basic Metabolic Panel:  Recent Labs Lab 12/04/15 0930 12/05/15 0359 12/06/15 0536 12/07/15 0257 12/07/15 2048 12/08/15 1018  12/09/15 0110 12/09/15 0416 12/09/15 0740 12/09/15 1103 12/09/15 1554  NA 146* 147* 149* 148* 147* 152*  < > 149* 148* 149* 147* 144  K 3.3* 3.9 3.4* 4.2 4.6 4.4  --   --   --   --   --   --   CL 110 110 106 111 110 111  --   --   --   --   --   --   CO2 27 28 29 26 28 31   --   --   --   --   --   --   GLUCOSE 147* 128* 191* 319* 260* 138*  --   --   --   --   --   --   BUN 35* 29* 28* 37* 38* 36*  --   --   --   --   --   --   CREATININE 1.66* 1.45* 1.40* 1.50* 1.54* 1.56*  --   --   --   --   --   --   CALCIUM 8.8* 8.9 9.5 9.6 9.3 9.3  --   --   --   --   --   --  MG 2.1  --   --   --   --   --   --   --   --   --   --   --   < > = values in this interval not displayed.  Liver Function Tests:  Recent Labs Lab 12/04/15 0930 12/05/15 0359 12/07/15 0257 12/07/15 2048 12/08/15 1018  AST 20 24 25 20 19   ALT 15* 17 22 20 20   ALKPHOS 113 116 120 107 106  BILITOT 0.8 0.9 0.4 0.5 0.5  PROT 5.3* 5.2* 5.1* 4.8* 5.0*  ALBUMIN 2.0* 1.9* 2.1* 2.0* 2.1*   No results for input(s): LIPASE, AMYLASE in the last 168 hours.  Recent Labs Lab 12/08/15 1018  AMMONIA 9    CBC:  Recent Labs Lab 12/02/15 2340  12/03/15 0515 12/04/15 0930 12/05/15 0359 12/06/15 0536 12/07/15 0257 12/08/15 0436 12/09/15 0416  WBC 21.9*  --  20.0* 16.7* 15.1* 12.4* 13.5* 15.4* 17.7*  NEUTROABS 10.5*  --  8.2* 6.5 4.7  --  4.6  --   --   HGB 11.3*  < > 8.9* 9.0* 9.9* 10.3* 9.4* 8.4* 8.7*  HCT 36.1*  < > 28.7* 28.6* 31.8* 33.1* 30.2* 28.5* 29.2*  MCV 91.6  --  92.6 91.1 91.1 92.2 91.5 92.5 92.7  PLT 134*  --   108* 105* 94* 109* 95* 100* 109*  < > = values in this interval not displayed.  Cardiac Enzymes:  Recent Labs Lab 12/04/15 0045 12/04/15 0930 12/06/15 0906 12/06/15 1524 12/06/15 2048  TROPONINI 0.07* 0.10* 0.04* 0.04* 0.04*    Lipid Panel: No results for input(s): CHOL, TRIG, HDL, CHOLHDL, VLDL, LDLCALC in the last 168 hours.  CBG:  Recent Labs Lab 12/09/15 0005 12/09/15 0408 12/09/15 0805 12/09/15 1149 12/09/15 1703  GLUCAP 191* 202* 159* 124* 197*    Microbiology: Results for orders placed or performed during the hospital encounter of 12/02/15  Blood culture (routine x 2)     Status: None   Collection Time: 12/03/15 12:15 AM  Result Value Ref Range Status   Specimen Description BLOOD RIGHT ARM  Final   Special Requests BOTTLES DRAWN AEROBIC AND ANAEROBIC 5ML  Final   Culture NO GROWTH 5 DAYS  Final   Report Status 12/08/2015 FINAL  Final  Blood culture (routine x 2)     Status: None   Collection Time: 12/03/15 12:45 AM  Result Value Ref Range Status   Specimen Description BLOOD RIGHT FOREARM  Final   Special Requests BOTTLES DRAWN AEROBIC AND ANAEROBIC 5ML  Final   Culture NO GROWTH 5 DAYS  Final   Report Status 12/08/2015 FINAL  Final  Culture, Urine     Status: None   Collection Time: 12/08/15  8:00 PM  Result Value Ref Range Status   Specimen Description URINE, CATHETERIZED  Final   Special Requests NONE  Final   Culture NO GROWTH 1 DAY  Final   Report Status 12/09/2015 FINAL  Final  MRSA PCR Screening     Status: None   Collection Time: 12/08/15  9:26 PM  Result Value Ref Range Status   MRSA by PCR NEGATIVE NEGATIVE Final    Comment:        The GeneXpert MRSA Assay (FDA approved for NASAL specimens only), is one component of a comprehensive MRSA colonization surveillance program. It is not intended to diagnose MRSA infection nor to guide or monitor treatment for MRSA infections.     Imaging: Ct Head Wo Contrast  12/07/2015  CLINICAL DATA:   Acute encephalopathy EXAM: CT HEAD WITHOUT CONTRAST TECHNIQUE: Contiguous axial images were obtained from the base of the skull through the vertex without intravenous contrast. COMPARISON:  12/02/2015 FINDINGS: No intracranial hemorrhage, mass effect or midline shift. Stable cerebral atrophy. Ventricular size is stable from prior exam. Stable periventricular and patchy subcortical chronic white matter disease. Small lacunar infarct in left thalamus is stable. Stable old infarcts in right frontal lobe and left parietal lobe high convexity axial image 23. No definite acute cortical infarction. No mass lesion is noted on this unenhanced scan. No intraventricular hemorrhage. No skull fracture is noted. Paranasal sinuses and mastoid air cells are unremarkable. Atherosclerotic calcifications of carotid siphon and vertebral arteries again noted. IMPRESSION: 1. No acute intracranial abnormality. Stable atrophy and chronic white matter disease. Stable old infarcts and described above. No definite acute cortical infarction. No mass lesion is noted on this unenhanced scan. No intraventricular hemorrhage. Electronically Signed   By: Lahoma Crocker M.D.   On: 12/07/2015 16:57   Dg Chest Port 1 View  12/08/2015  CLINICAL DATA:  Shortness of breath, fever.  Question pneumonia EXAM: PORTABLE CHEST 1 VIEW COMPARISON:  12/02/2015 FINDINGS: Left pacer remains in place, unchanged. Prior CABG. Cardiomegaly. Small bilateral pleural effusions. Mild vascular congestion. Bibasilar opacities are noted, right greater than left which could represent edema or infection. These are similar to prior study. IMPRESSION: Cardiomegaly with vascular congestion and small effusions. Bibasilar edema or infiltrates, right slightly greater than left. No real change. Electronically Signed   By: Rolm Baptise M.D.   On: 12/08/2015 09:00    Assessment and plan:   [name] [name] is an 82 y.o. male patient with toxic metabolic encephalopathy based on clinical  and electrographic evaluation. Follow-up EEG today showed evidence of encephalopathy, without abnormal discharges or seizures noted. At this point, to help with his mental status, recommend discontinuing Depakote. It was initially started due to a seizure earlier during this admission. Etiology for this seizure is unknown. He also has a rash, again etiology unknown, and under evaluation by the primary team.   We'll follow-up.

## 2015-12-09 NOTE — Consult Note (Addendum)
WOC wound consult note Reason for Consult: Consult requested for multiple locations.  Pt has red round lesions over most of his body of unknown etiology; unsure if this is related to a drug reaction, insect bites, or vasculitis process.  Many of the locations are dark red and several have evolved into black dry scabs; some areas have blistered and peeled.  The lesions are located in groin and penis area, across bilat back and lower trunk and legs. Wound type: Right shoulder with affected area approx 20X20X.1cm, pink moist wound bed surrounded by loose peeling skin, small amt yellow drainage, no odor Middle back and sacrum with .5X.5cm dark purple reddish deep tissue injuries in scattered locations Pressure Ulcer POA: yes; they were noted as stage 1 but have evolved at this time Dressing procedure/placement/frequency: Palliative care consult is pending, according to the EMR.  Foam dressing to open areas where skin is peeling and evolving into partial thickness skin loss, to absorb drainage and protect from further injury. There are oral lesiosn noted in the chart; consider ENT if aggressive plan of care is desired for further plan of care. Please re-consult if further assistance is needed.  Thank-you,  Julien Girt MSN, [place], [place], [place], [place]

## 2015-12-09 NOTE — Progress Notes (Signed)
EEG completed, results pending. 

## 2015-12-09 NOTE — Progress Notes (Addendum)
[facility] TEAM 1 - Stepdown/ICU TEAM PROGRESS NOTE  [name] [name] [id number] DOB: 1926/04/21 DOA: 12/02/2015 PCP: Odette Fraction, MD  Admit HPI / Brief Narrative: 82yo M w/ hx of recent Vfib arrest who was admitted from his SNF on 12/02/2015 with a seizure episode associated with right-sided weakness. Patient was recently hospitalized for V. fib arrest requiring intubation followed by acute respiratory failure. Patient also had persistent dysphagia at the time of the recent discharge.  Initially the patient was started on broad-spectrum antibiotics.  Antibiotics were narrowed down. Blood cultures remained negative. Chest x-ray showed possible pneumonia.  Patient was started on Depakote w/ no further seizures.  EEG and repeat CT scans were unremarkable, but the pt had been developing progressive lethargy and progressive dysphagia.  HPI/Subjective: The pt is noncommunicative at the time of my exam.  He will push my hands away intermittently during the exam, but will not follow simple commands and does not attempt to speak to me.    Assessment/Plan:  Sepsis due to RLL pneumonia, likely aspiration - Acute hypoxic respiratory failure Has now completed 8 days of abx tx - afebrile - WBC not reliable as pt on steroid - stop abx and follow clinically   Diffuse bullous rash  Concerning for drug reaction (Ste[place] type) v/s bullous pemphigoid - doubt the later as the pt has been on systemic steroids - stop all non-essential medications - trial of clobetasol topical - if worsens will have to speak to Neuro about stopping depakote  COPD exacerbation No wheezing on exam today - stop steroid as this may be contributing to AMS  New onset seizure Likely metabolic in nature from hypoxia - awaiting results of today's EEG   Metabolic encephalopathy Initial EEG w/ no evidence of epileptogenic focus - Neurology recommended discontinuing Aricept - repeat CT 2 w/o evidence of acute  intracranial abnormality - RPR negative - 0000000 and folic acid levels therapeutic - ammonia and TSH normal - d/c potential offending meds - follow   Dysphagia  difficulty swallowing due to AMS   Permanent atrial fibrillation / Mobitz type II AV block s/p pacemaker implant Rate presently well controlled - unable to cont Xarelto at present as he is not taking oral meds/food   Coronary artery disease - Demand ischemia No evidence to suggest ACS - very minimal trop increase most c/w demand ischemia   Essential hypertension Not an active issue at this time   Recent V. fib arrest  Chronic systolic CHF No evidence of acute exacerbation - in fact appears [facility] presently  Filed Weights   12/02/15 2357 12/03/15 0400 12/08/15 2020  Weight: 72 kg (158 lb 11.7 oz) 65.9 kg (145 lb 4.5 oz) 66.4 kg (146 lb 6.2 oz)   Prolonged QTc  avoid QT prolonging medications  Chronic kidney disease stage III creatinine back to baseline  Recent Labs Lab 12/05/15 0359 12/06/15 0536 12/07/15 0257 12/07/15 2048 12/08/15 1018  CREATININE 1.45* 1.40* 1.50* 1.54* 1.56*   Pressure ulcer - Bilateral buttocks unstageable Frequent turns and monitoring - continue barrier cream  Severe protein calorie malnutrition No reliable means to feed at this time - PC to help establish goals of care   Hypernatremia Due to DH/free water deficit - cont free water via IV   Steroid-induced hyperglycemia Stop steroid - follow CBG   Code Status: DNR - NO CODE BLUE  Family Communication: no family present at time of exam Disposition Plan: SDU - goals of care meeting pending w/ Palliative Care  Consultants: Palliative Care  Procedures: 4/3 TTE - mod LVH - global hypokinesis - EF 40-45%  Antibiotics: ceftriazone 4/4 > 4/5 flagyl 4/4 > 4/5 Cefepime 3/29 > 4/02 Vanc 3/29 > 3/31  DVT prophylaxis: SCDs  Objective: Blood pressure 99/61, pulse 67, temperature 98.7 F (37.1 C), temperature source Axillary, resp.  rate 23, height 5\' 11"  (1.803 m), weight 66.4 kg (146 lb 6.2 oz), SpO2 100 %.  Intake/Output Summary (Last 24 hours) at 12/09/15 1433 Last data filed at 12/09/15 0236  Gross per 24 hour  Intake   1255 ml  Output      1 ml  Net   1254 ml   Exam: General: No acute respiratory distress evident - requiring FM O2 support  Lungs: bibasilar crackles - no wheeze Cardiovascular: Regular rate without murmur gallop or rub normal S1 and S2 Abdomen: Nondistended, soft, bowel sounds positive, no ascites, no appreciable mass Extremities: No significant cyanosis, clubbing, or edema bilateral lower extremities Cutaneous:  Diffuse bullous lesions w/ erythematous base - no purulent d/c   Data Reviewed:  Basic Metabolic Panel:  Recent Labs Lab 12/04/15 0930 12/05/15 0359 12/06/15 0536 12/07/15 0257 12/07/15 2048 12/08/15 1018  12/08/15 1905 12/09/15 0110 12/09/15 0416 12/09/15 0740 12/09/15 1103  NA 146* 147* 149* 148* 147* 152*  < > 147* 149* 148* 149* 147*  K 3.3* 3.9 3.4* 4.2 4.6 4.4  --   --   --   --   --   --   CL 110 110 106 111 110 111  --   --   --   --   --   --   CO2 27 28 29 26 28 31   --   --   --   --   --   --   GLUCOSE 147* 128* 191* 319* 260* 138*  --   --   --   --   --   --   BUN 35* 29* 28* 37* 38* 36*  --   --   --   --   --   --   CREATININE 1.66* 1.45* 1.40* 1.50* 1.54* 1.56*  --   --   --   --   --   --   CALCIUM 8.8* 8.9 9.5 9.6 9.3 9.3  --   --   --   --   --   --   MG 2.1  --   --   --   --   --   --   --   --   --   --   --   < > = values in this interval not displayed.  CBC:  Recent Labs Lab 12/02/15 2340  12/03/15 0515 12/04/15 0930 12/05/15 0359 12/06/15 0536 12/07/15 0257 12/08/15 0436 12/09/15 0416  WBC 21.9*  --  20.0* 16.7* 15.1* 12.4* 13.5* 15.4* 17.7*  NEUTROABS 10.5*  --  8.2* 6.5 4.7  --  4.6  --   --   HGB 11.3*  < > 8.9* 9.0* 9.9* 10.3* 9.4* 8.4* 8.7*  HCT 36.1*  < > 28.7* 28.6* 31.8* 33.1* 30.2* 28.5* 29.2*  MCV 91.6  --  92.6 91.1  91.1 92.2 91.5 92.5 92.7  PLT 134*  --  108* 105* 94* 109* 95* 100* 109*  < > = values in this interval not displayed.  Liver Function Tests:  Recent Labs Lab 12/04/15 0930 12/05/15 0359 12/07/15 0257 12/07/15 2048 12/08/15 1018  AST 20 24 25  20  19  ALT 15* 17 22 20 20   ALKPHOS 113 116 120 107 106  BILITOT 0.8 0.9 0.4 0.5 0.5  PROT 5.3* 5.2* 5.1* 4.8* 5.0*  ALBUMIN 2.0* 1.9* 2.1* 2.0* 2.1*    Recent Labs Lab 12/08/15 1018  AMMONIA 9   Coags:  Recent Labs Lab 12/02/15 2340 12/04/15 0930  INR 3.91* 1.69*    Recent Labs Lab 12/02/15 2340 12/03/15 1244  APTT 40* 118*    Cardiac Enzymes:  Recent Labs Lab 12/04/15 0045 12/04/15 0930 12/06/15 0906 12/06/15 1524 12/06/15 2048  TROPONINI 0.07* 0.10* 0.04* 0.04* 0.04*    CBG:  Recent Labs Lab 12/08/15 2134 12/09/15 0005 12/09/15 0408 12/09/15 0805 12/09/15 1149  GLUCAP 250* 191* 202* 159* 124*    Recent Results (from the past 240 hour(s))  Blood culture (routine x 2)     Status: None   Collection Time: 12/03/15 12:15 AM  Result Value Ref Range Status   Specimen Description BLOOD RIGHT ARM  Final   Special Requests BOTTLES DRAWN AEROBIC AND ANAEROBIC 5ML  Final   Culture NO GROWTH 5 DAYS  Final   Report Status 12/08/2015 FINAL  Final  Blood culture (routine x 2)     Status: None   Collection Time: 12/03/15 12:45 AM  Result Value Ref Range Status   Specimen Description BLOOD RIGHT FOREARM  Final   Special Requests BOTTLES DRAWN AEROBIC AND ANAEROBIC 5ML  Final   Culture NO GROWTH 5 DAYS  Final   Report Status 12/08/2015 FINAL  Final  Culture, Urine     Status: None (Preliminary result)   Collection Time: 12/08/15  8:00 PM  Result Value Ref Range Status   Specimen Description URINE, CATHETERIZED  Final   Special Requests NONE  Final   Culture NO GROWTH < 24 HOURS  Final   Report Status PENDING  Incomplete  MRSA PCR Screening     Status: None   Collection Time: 12/08/15  9:26 PM  Result  Value Ref Range Status   MRSA by PCR NEGATIVE NEGATIVE Final    Comment:        The GeneXpert MRSA Assay (FDA approved for NASAL specimens only), is one component of a comprehensive MRSA colonization surveillance program. It is not intended to diagnose MRSA infection nor to guide or monitor treatment for MRSA infections.      Studies:   Recent x-ray studies have been reviewed in detail by the Attending Physician  Scheduled Meds:  Scheduled Meds: . amiodarone  200 mg Oral Daily  . atorvastatin  10 mg Oral Daily  . cefTRIAXone (ROCEPHIN)  IV  1 g Intravenous Q24H  . doxazosin  1 mg Oral Daily  . feeding supplement (ENSURE ENLIVE)  237 mL Oral BID BM  . fluticasone  1 spray Each Nare Daily  . insulin aspart  0-15 Units Subcutaneous Q4H  . ipratropium-albuterol  3 mL Nebulization QID  . methylPREDNISolone (SOLU-MEDROL) injection  60 mg Intravenous Daily  . metronidazole  500 mg Intravenous Q8H  . RESOURCE THICKENUP CLEAR   Oral Once  . rivaroxaban  15 mg Oral Q supper  . valproate sodium  500 mg Intravenous Q12H    Time spent on care of this patient: 35 mins   Mackinzie Vuncannon T , MD   [facility] Office  [phone] Pager - Text Page per Shea Evans as per below:  On-Call/Text Page:      [email or web address]      password [email or web address]  If 7PM-7AM, please contact night-coverage  [email or web address] Password [email or web address] 12/09/2015, 2:33 PM   LOS: 6 days

## 2015-12-09 NOTE — Progress Notes (Signed)
PT Cancellation Note  Patient Details Name: [name] [name] MRN: [place]:[id number] DOB: 1926/08/14   Cancelled Treatment:    Reason Eval/Treat Not Completed: Patient not medically ready; patient with tenuous respiratory status, just placed on NRB after on bipap all morning and continues to require excessive postural and accessory musculature for breathing.  Will attempt another day.   Reginia Naas 12/09/2015, 1:54 PM  Magda Kiel, [place] 12/09/2015

## 2015-12-09 NOTE — Progress Notes (Signed)
Pt taken off bipap and placed on 100% NRB.  Pt tolerating well.  Sats 98%, RR 18, HR 66.  RT will continue to monitor.  Pt is a DNR.

## 2015-12-09 NOTE — Procedures (Signed)
ELECTROENCEPHALOGRAM REPORT  Date of Study: 12/09/2015  Patient's Name: [name] [name] MRN: [place]:[id number] Date of Birth: 22-Sep-1925  Referring Provider: Dr. Berle Mull  Clinical History: This is an 82 year old man with seizure and right-sided weakness, with progressive lethargy and dysphagia.  Medications: valproate (DEPACON) 500 mg in dextrose 5 % 50 mL IVPB albuterol (PROVENTIL) (2.5 MG/3ML) 0.083% nebulizer solution 3 mL amiodarone (PACERONE) tablet 200 mg atorvastatin (LIPITOR) tablet 10 mg cefTRIAXone (ROCEPHIN) 1 g in dextrose 5 % 50 mL IVPB doxazosin (CARDURA) tablet 1 mg fluticasone (FLONASE) 50 MCG/ACT nasal spray 1 spray insulin aspart (novoLOG) injection 0-15 Units ipratropium-albuterol (DUONEB) 0.5-2.5 (3) MG/3ML nebulizer solution 3 mL methylPREDNISolone sodium succinate (SOLU-MEDROL) 125 mg/2 mL injection 60 mg metroNIDAZOLE (FLAGYL) IVPB 500 mg Rivaroxaban (XARELTO) tablet 15 mg  Technical Summary: A multichannel digital EEG recording measured by the international 10-20 system with electrodes applied with paste and impedances below 5000 ohms performed as portable with EKG monitoring in an awake and asleep patient.  Hyperventilation and photic stimulation were not performed.  The digital EEG was referentially recorded, reformatted, and digitally filtered in a variety of bipolar and referential montages for optimal display.   Description: The patient is awake and asleep during the recording. He is noted to be confused, occasionally following commands. During maximal wakefulness, there is a poorly sustained symmetric, medium voltage 8 Hz posterior dominant rhythm that poorly attenuates with eye opening and eye closure. This is admixed with a small amount of diffuse 4-6 Hz theta slowing of the waking background. During drowsiness and sleep, there is an increase in theta and delta slowing of the background, with occasional poorly formed vertex waves seen.  Hyperventilation and  photic stimulation were not performed.  He is noted to have tremulous movements and jaw jerks with no EEG correlate seen. There were no epileptiform discharges or electrographic seizures seen.    EKG lead was unremarkable.  Impression: This awake and asleep EEG is abnormal due to mild diffuse slowing of the waking background.  Clinical Correlation of the above findings indicates diffuse cerebral dysfunction that is non-specific in etiology and can be seen with hypoxic/ischemic injury, toxic/metabolic encephalopathies, neurodegenerative disorders, or medication effect.  The absence of epileptiform discharges does not rule out a clinical diagnosis of epilepsy.  Clinical correlation is advised.   Ellouise Newer, M.D.

## 2015-12-09 NOTE — Progress Notes (Signed)
Pt off non-rebreather mask and now on 4L nasal cannula with oxygen saturation 100%, respiratory rate 19. Pt is more alert now and able to follow commands. Will continue to monitor.

## 2015-12-09 NOTE — Progress Notes (Signed)
SLP Cancellation Note  Patient Details Name: NICOLAE DANN MRN: [id number] DOB: 02/22/1926   Cancelled treatment:       Reason Eval/Treat Not Completed: Medical issues which prohibited therapy. Pt on BiPAP this morning. Will f/u as able.   Germain Osgood, M.A. CCC-SLP [phone]  Germain Osgood 12/09/2015, 10:34 AM

## 2015-12-09 NOTE — Progress Notes (Signed)
OT Cancellation Note  Patient Details Name: [name] [name] MRN: [id number] DOB: 1925-11-18   Cancelled Treatment:    Reason Eval/Treat Not Completed: Medical issues which prohibited therapy. Pt off BIPAP but with venti mask 15 L with RR 31 on arrival. Pt with accessory breathing at this time and not appropriate for OT evaluation. Ot to hold eval at this time.  Vonita Moss   OTR/L Pager: [phone] Office: [phone] .  12/09/2015, 2:36 PM

## 2015-12-10 DIAGNOSIS — N183 Chronic kidney disease, stage 3 (moderate): Secondary | ICD-10-CM

## 2015-12-10 DIAGNOSIS — I469 Cardiac arrest, cause unspecified: Secondary | ICD-10-CM

## 2015-12-10 DIAGNOSIS — I4901 Ventricular fibrillation: Secondary | ICD-10-CM

## 2015-12-10 DIAGNOSIS — I5022 Chronic systolic (congestive) heart failure: Secondary | ICD-10-CM

## 2015-12-10 DIAGNOSIS — J441 Chronic obstructive pulmonary disease with (acute) exacerbation: Secondary | ICD-10-CM

## 2015-12-10 DIAGNOSIS — G9341 Metabolic encephalopathy: Secondary | ICD-10-CM

## 2015-12-10 LAB — CBC WITH DIFFERENTIAL/PLATELET
BASOS PCT: 0 %
Basophils Absolute: 0 10*3/uL (ref 0.0–0.1)
EOS PCT: 0 %
Eosinophils Absolute: 0 10*3/uL (ref 0.0–0.7)
HEMATOCRIT: 32.7 % — AB (ref 39.0–52.0)
HEMOGLOBIN: 10.1 g/dL — AB (ref 13.0–17.0)
LYMPHS PCT: 73 %
Lymphs Abs: 19.5 10*3/uL — ABNORMAL HIGH (ref 0.7–4.0)
MCH: 28.5 pg (ref 26.0–34.0)
MCHC: 30.9 g/dL (ref 30.0–36.0)
MCV: 92.1 fL (ref 78.0–100.0)
MONOS PCT: 3 %
Monocytes Absolute: 0.8 10*3/uL (ref 0.1–1.0)
NEUTROS PCT: 24 %
Neutro Abs: 6.4 10*3/uL (ref 1.7–7.7)
Platelets: 101 10*3/uL — ABNORMAL LOW (ref 150–400)
RBC: 3.55 MIL/uL — AB (ref 4.22–5.81)
RDW: 18.9 % — AB (ref 11.5–15.5)
WBC: 26.7 10*3/uL — AB (ref 4.0–10.5)

## 2015-12-10 LAB — GLUCOSE, CAPILLARY: Glucose-Capillary: 156 mg/dL — ABNORMAL HIGH (ref 65–99)

## 2015-12-10 LAB — MAGNESIUM: Magnesium: 2.4 mg/dL (ref 1.7–2.4)

## 2015-12-10 LAB — LACTIC ACID, PLASMA: LACTIC ACID, VENOUS: 1.6 mmol/L (ref 0.5–2.0)

## 2015-12-10 NOTE — Progress Notes (Signed)
Daily Progress Note   Patient Name: [name] [name]       Date: 12/10/2015 DOB: [birth year]  Age: 82 y.o. MRN#: [id number] Attending Physician: Allie Bossier, MD Primary Care Physician: Odette Fraction, MD Admit Date: 12/02/2015  Reason for Consultation/Follow-up: Establishing goals of care 82yo M w/ hx of recent Vfib arrest who was admitted from his SNF on 12/02/2015 with a seizure episode associated with right-sided weakness. Patient was recently hospitalized for V. fib arrest requiring intubation followed by acute respiratory failure. Patient also had persistent dysphagia at the time of the recent discharge.  Initially the patient was started on broad-spectrum antibiotics.  Antibiotics were narrowed down. Blood cultures remained negative. Chest x-ray showed possible pneumonia.  Patient was started on Depakote w/ no further seizures.  EEG and repeat CT scans were unremarkable, but the pt had been developing progressive lethargy and progressive dysphagia.   Subjective:  appears a little more awake alert than yesterday, now on O2 [place].  Interval Events:  still with congestion, appears with episodic dyspnea. Discussed with bedside rn about using prn iv morphine.  Consider residential hospice on d/c.   Length of Stay: 7 days  Current Medications: Scheduled Meds:  . antiseptic oral rinse  7 mL Mouth Rinse q12n4p  . chlorhexidine  15 mL Mouth Rinse BID  . clobetasol cream   Topical BID  . ipratropium-albuterol  3 mL Nebulization QID    Continuous Infusions:    PRN Meds: albuterol, glycopyrrolate, morphine injection, RESOURCE THICKENUP CLEAR  Physical Exam: Physical Exam             Weak frail elderly gentleman On O2, is able to answer few yes/no questions Coarse rhonchi S1  S2 Abdomen soft Trace edema  Vital Signs: BP 104/51 mmHg  Pulse 78  Temp(Src) 100.8 F (38.2 C) (Oral)  Resp 29  Ht 5\' 11"  (1.803 m)  Wt 70.5 kg (155 lb 6.8 oz)  BMI 21.69 kg/m2  SpO2 94% SpO2: SpO2: 94 % O2 Device: O2 Device: Nasal Cannula O2 Flow Rate: O2 Flow Rate (L/min): 5 L/min  Intake/output summary:  Intake/Output Summary (Last 24 hours) at 12/10/15 1500 Last data filed at 12/10/15 0049  Gross per 24 hour  Intake      0 ml  Output    600 ml  Net   -  600 ml   LBM: Last BM Date: 12/07/15 Baseline Weight: Weight: 72 kg (158 lb 11.7 oz) Most recent weight: Weight: 70.5 kg (155 lb 6.8 oz)       Palliative Assessment/Data: Flowsheet Rows        Most Recent Value   Intake Tab    Referral Department  Hospitalist   Unit at Time of Referral  ICU   Palliative Care Primary Diagnosis  Pulmonary   Palliative Care Type  New Palliative care   Reason for referral  Clarify Goals of Care, End of Life Care Assistance   Date first seen by Palliative Care  12/09/15   Clinical Assessment    Palliative Performance Scale Score  20%   Pain Max last 24 hours  5   Pain Min Last 24 hours  4   Dyspnea Max Last 24 Hours  4   Dyspnea Min Last 24 hours  3   Psychosocial & Spiritual Assessment    Palliative Care Outcomes    Patient/Family meeting held?  Yes   Who was at the meeting?  dtr over the phone    Palliative Care Outcomes  Counseled regarding hospice   Palliative Care follow-up planned  Yes, Facility      Additional Data Reviewed: CBC    Component Value Date/Time   WBC 26.7* 12/10/2015 0902   WBC 14.8* 03/13/2014 1109   WBC 12.6* 02/07/2013 1413   RBC 3.55* 12/10/2015 0902   RBC 2.83* 11/26/2015 0520   RBC 3.77* 03/13/2014 1109   RBC 4.43 02/07/2013 1413   HGB 10.1* 12/10/2015 0902   HGB 11.8* 03/13/2014 1109   HGB 13.0 02/07/2013 1413   HCT 32.7* 12/10/2015 0902   HCT 26.5* 11/26/2015 0520   HCT 36.7* 03/13/2014 1109   HCT 40.7 02/07/2013 1413   PLT 101*  12/10/2015 0902   PLT 66* 03/13/2014 1109   PLT 128* 02/07/2013 1413   MCV 92.1 12/10/2015 0902   MCV 97 03/13/2014 1109   MCV 91.9 02/07/2013 1413   MCH 28.5 12/10/2015 0902   MCH 31.2 03/13/2014 1109   MCH 29.3 02/07/2013 1413   MCHC 30.9 12/10/2015 0902   MCHC 32.1 03/13/2014 1109   MCHC 31.9* 02/07/2013 1413   RDW 18.9* 12/10/2015 0902   RDW 14.5 03/13/2014 1109   RDW 15.1* 02/07/2013 1413   LYMPHSABS 19.5* 12/10/2015 0902   LYMPHSABS 8.3* 02/07/2013 1413   MONOABS 0.8 12/10/2015 0902   MONOABS 0.6 02/07/2013 1413   EOSABS 0.0 12/10/2015 0902   EOSABS 0.1 02/07/2013 1413   BASOSABS 0.0 12/10/2015 0902   BASOSABS 0.0 02/07/2013 1413    CMP     Component Value Date/Time   NA 144 12/09/2015 1554   NA 146* 11/18/2014 1210   NA 143 03/13/2014 1109   K 4.4 12/08/2015 1018   K 3.6 03/13/2014 1109   CL 111 12/08/2015 1018   CL 107 03/13/2014 1109   CO2 31 12/08/2015 1018   CO2 30 03/13/2014 1109   GLUCOSE 138* 12/08/2015 1018   GLUCOSE 118* 11/18/2014 1210   GLUCOSE 119* 03/13/2014 1109   BUN 36* 12/08/2015 1018   BUN 10 11/18/2014 1210   BUN 13 03/13/2014 1109   CREATININE 1.56* 12/08/2015 1018   CREATININE 1.33 01/15/2015 1637   CREATININE 1.17 03/13/2014 1109   CALCIUM 9.3 12/08/2015 1018   CALCIUM 8.8 03/13/2014 1109   PROT 5.0* 12/08/2015 1018   PROT 6.4 03/13/2014 1109   ALBUMIN 2.1* 12/08/2015 1018   ALBUMIN  3.5 03/13/2014 1109   AST 19 12/08/2015 1018   AST 35 03/13/2014 1109   ALT 20 12/08/2015 1018   ALT 38 03/13/2014 1109   ALKPHOS 106 12/08/2015 1018   ALKPHOS 76 03/13/2014 1109   BILITOT 0.5 12/08/2015 1018   BILITOT 1.6* 03/13/2014 1109   GFRNONAA 38* 12/08/2015 1018   GFRNONAA 48* 01/15/2015 1637   GFRNONAA 56* 03/13/2014 1109   GFRAA 44* 12/08/2015 1018   GFRAA 55* 01/15/2015 1637   GFRAA >60 03/13/2014 1109       Problem List:  Patient Active Problem List   Diagnosis Date Noted  . HCAP (healthcare-associated pneumonia)   . SOB  (shortness of breath)   . Encounter for palliative care   . Goals of care, counseling/discussion   . Sepsis ([place]) 12/03/2015  . Seizures ([place]) 12/03/2015  . Pleural effusion   . Pleural effusion on right   . Acute on chronic systolic congestive heart failure ([place])   . Bacteremia due to Streptococcus   . Encounter for imaging study to confirm orogastric (OG) tube placement   . Endotracheally intubated   . Pressure ulcer 11/14/2015  . Cardiac arrest ([place])   . Polymorphic ventricular tachycardia ([place])   . Protein-calorie malnutrition, severe ([place]) 11/13/2015  . Acute systolic heart failure ([place]) 11/10/2015  . Sepsis due to pneumonia ([place]) 11/10/2015  . Acute encephalopathy   . Influenza with pneumonia   . Hypotension   . Encounter for therapeutic drug monitoring 10/09/2013  . Rotator cuff tear arthropathy of right shoulder 09/04/2013  . Back pain, acute 06/14/2013  . Right shoulder pain 05/03/2013  . Chronic pain in left shoulder 05/03/2013  . Abnormal CT scan, chest 01/10/2013  . Anticoagulated on Coumadin 01/01/2013  . Left shoulder pain 01/01/2013  . CHF (congestive heart failure) ([place]) 01/01/2013  . Permanent atrial fibrillation ([place]) 11/02/2012  . Lymphocytosis   . Balanitis 09/11/2012  . UTI (lower urinary tract infection) 09/05/2012  . Shortness of breath 09/05/2012  . Altered mental status 09/05/2012  . Dementia 08/14/2012  . Acute low back pain 07/02/2012  . Cellulitis of right leg 05/28/2012  . Hematoma 05/17/2012  . Leg pain 05/17/2012  . Pacemaker-St.Jude 04/26/2012  . Eczema 03/14/2012  . Right carotid bruit 01/06/2012  . Long term current use of anticoagulant 09/15/2011  . Thrombocytopenia ([place]) 09/15/2011  . Preventative health care 09/10/2011  . CONSTIPATION 06/01/2010  . CLOSED DISLOCATION OF ACROMIOCLAVICULAR 06/01/2010  . Abdominal aortic aneurysm ([place]) 01/28/2010  . COSTOCHONDRITIS 10/22/2009  . Cholesterolosis of gallbladder 07/08/2009  .  CHOLELITHIASIS 06/30/2009  . Mobitz type II atrioventricular block 06/17/2009  . ATRIAL FIBRILLATION, PAROXYSMAL 06/17/2009  . Coronary atherosclerosis 01/16/2009  . HYPERTENSION, BENIGN 12/31/2008  . BACK PAIN, CHRONIC 12/31/2008  . Edema 12/31/2008  . Hypothyroidism 11/05/2008  . VITAMIN D DEFICIENCY 11/05/2008  . UNS ADVRS EFF OTH RX MEDICINAL&BIOLOGICAL SBSTNC 11/05/2008  . ERECTILE DYSFUNCTION 09/02/2008  . ARTHRITIS, HIP 09/02/2008  . RESTLESS LEG SYNDROME 12/27/2007  . INSOMNIA 12/27/2007  . HYPERLIPIDEMIA 08/15/2007  . CORONARY ATHEROSCLEROSIS, ARTERY BYPASS GRAFT 08/15/2007  . COPD 08/15/2007  . GERD 08/15/2007  . FREQUENCY, URINARY 08/15/2007  . BPH (benign prostatic hypertrophy) 08/15/2007     Palliative Care Assessment & Plan    1.Code Status:  DNR    Code Status Orders        Start     Ordered   12/03/15 0424  Do not attempt resuscitation (DNR)   Continuous    Question Answer  Comment  In the event of cardiac or respiratory ARREST Do not call a "code blue"   In the event of cardiac or respiratory ARREST Do not perform Intubation, CPR, defibrillation or ACLS   In the event of cardiac or respiratory ARREST Use medication by any route, position, wound care, and other measures to relive pain and suffering. May use oxygen, suction and manual treatment of airway obstruction as needed for comfort.      12/03/15 0426    Code Status History    Date Active Date Inactive Code Status Order ID Comments User Context   11/18/2015 10:01 AM 12/02/2015  2:23 AM DNR [id number]  Raylene Miyamoto, MD Inpatient   11/10/2015  1:06 PM 11/18/2015 10:01 AM Full Code [id number]  Rondel Jumbo, PA-C ED   09/05/2012  4:48 AM 09/07/2012  6:50 PM Full Code [id number]  Brantley Persons, RN Inpatient    Advance Directive Documentation        Most Recent Value   Type of Advance Directive  Living will, Healthcare Power of Attorney   Pre-existing out of facility DNR order (yellow form or  pink MOST form)     "MOST" Form in Place?         2. Goals of Care/Additional Recommendations:     Limitations on Scope of Treatment: Full Comfort Care  Desire for further Chaplaincy support:no  Psycho-social Needs: Education on Hospice  3. Symptom Management:      1. Continue current treatments   4. Palliative Prophylaxis:   Delirium Protocol  5. Prognosis: < 2 weeks  6. Discharge Planning:  Hospice facility   Care plan was discussed with  Dr Sherral Hammers.   Thank you for allowing the Palliative Medicine Team to assist in the care of this patient.   Time In:  9 Time Out: 925 Total Time 25 Prolonged Time Billed  no        [phone]  Loistine Chance, MD  12/10/2015, 3:00 PM  Please contact Palliative Medicine Team phone at [phone] for questions and concerns.

## 2015-12-10 NOTE — Progress Notes (Signed)
Per palliative care note, MD states not to escalate care of have use of BiPap. Pt is able to tolerate nasal cannula at 4LPM at 97-99% a with no distress and is able to answer questions at baseline. Pt has no complaints of shortness of breath. Will monitor pt closely and reassess as needed

## 2015-12-10 NOTE — Progress Notes (Signed)
Nutrition Follow Up/Brief Note  Chart reviewed. Pt now transitioning to comfort care.  No further nutrition interventions warranted at this time.  Please consult as needed.   Katie Bralynn Donado, RD, LDN Pager #: [phone] After-Hours Pager #: [phone]    

## 2015-12-10 NOTE — Progress Notes (Signed)
Speech Pathology:  Pt has transitioned to full comfort care; our services will sign off. Thank you, Monseratt Ledin L. Tivis Ringer, [place] CCC/SLP Pager [phone]

## 2015-12-10 NOTE — Care Management Important Message (Signed)
Important Message  Patient Details  Name: [name] [name] MRN: [id number] Date of Birth: April 30, 1926   Medicare Important Message Given:  Yes    Emaley Applin Abena 12/10/2015, 11:15 AM

## 2015-12-10 NOTE — Progress Notes (Signed)
RT NOTE:  Pt has order for BIPAP QHS, however, there is no documentation showing that the patient has worn @ bedtime. Pt is wearing 2L [place] @ this time and tolerating well. Pt is planned to discharge tomorrow to Hospice. RT will monitor tonight and BIPAP is available @ bedside if needed.

## 2015-12-10 NOTE — Progress Notes (Signed)
CSW spoke with pt dtr, Fraser Din [phone]) concerning palliative recommendation for hospice- dtr is agreeable.  Dtr expresses preference for [facility] since that is close to pt sister  CSW made referral to [facility]- they are hopeful they can offer a bed tomorrow if pt is appropriate  CSW will continue to follow  Domenica Reamer, [place] Social Worker [phone]

## 2015-12-10 NOTE — Progress Notes (Signed)
[facility] TEAM 1 - Stepdown/ICU TEAM Progress Note  [name] [name] [id number] DOB: 10/25/1925 DOA: 12/02/2015 PCP: Odette Fraction, MD   Admit HPI / Brief Narrative: 82yo WM from SNF PMHx  Dementia, Recent V-Fib Arrest, Chronic Systolic CHF, A. Fib, just discharged on 3/28 (Acute Respiratory Failure requiring intubation, NSVT, A.- Fib with Heart Block S/P pacer, positive influenza A),   Who was admitted from his SNF on 12/02/2015 with a seizure episode associated with right-sided weakness. Patient was recently hospitalized for V. fib arrest requiring intubation followed by acute respiratory failure. Patient also had persistent dysphagia at the time of the recent discharge.  Initially the patient was started on broad-spectrum antibiotics. Antibiotics were narrowed down. Blood cultures remained negative. Chest x-ray showed possible pneumonia.  Patient was started on Depakote w/ no further seizures. EEG and repeat CT scans were unremarkable, but the pt had been developing progressive lethargy and progressive dysphagia.   HPI/Subjective: 4/6 A/O x 0 just received morphine  Assessment/Plan: Sepsis due to RLL pneumonia, likely aspiration - Acute hypoxic respiratory failure Has now completed 8 days of abx tx - afebrile - WBC not reliable as pt on steroid - stop abx and follow clinically  -Comfort care awaiting placement to inpatient hospice COPD exacerbation No wheezing on exam today - stop steroid as this may be contributing to AMS  Diffuse bullous rash  Concerning for drug reaction (Stevens-[place] type) v/s bullous pemphigoid - doubt the later as the pt has been on systemic steroids - stop all non-essential medications - trial of clobetasol topical - if worsens will have to speak to Neuro about stopping depakote -Comfort care awaiting placement to inpatient hospice  New onset seizure Likely metabolic in nature from hypoxia - awaiting results of today's EEG   Metabolic  encephalopathy Initial EEG w/ no evidence of epileptogenic focus - Neurology recommended discontinuing Aricept - repeat CT 2 w/o evidence of acute intracranial abnormality - RPR negative - 0000000 and folic acid levels therapeutic - ammonia and TSH normal - d/c potential offending meds - follow  -Comfort care awaiting placement to inpatient hospice  Dysphagia  difficulty swallowing due to AMS   Permanent atrial fibrillation / Mobitz type II AV block s/p pacemaker implant Rate presently well controlled - unable to cont Xarelto at present as he is not taking oral meds/food   Coronary artery disease - Demand ischemia No evidence to suggest ACS - very minimal trop increase most c/w demand ischemia   Essential hypertension Not an active issue at this time   Recent V. fib arrest  Chronic systolic CHF No evidence of acute exacerbation - in fact appears DH presently -Strict in and out -Daily weight Filed Weights   12/03/15 0400 12/08/15 2020 12/10/15 0425  Weight: 65.9 kg (145 lb 4.5 oz) 66.4 kg (146 lb 6.2 oz) 70.5 kg (155 lb 6.8 oz)    Prolonged QTc  avoid QT prolonging medications  Chronic kidney disease stage III creatinine back to baseline -Comfort care awaiting placement to inpatient hospice  Pressure ulcer - Bilateral buttocks unstageable Frequent turns and monitoring - continue barrier cream  Severe protein calorie malnutrition No reliable means to feed at this time - PC to help establish goals of care   Hypernatremia Due to DH/free water deficit - cont free water via IV   Steroid-induced hyperglycemia Stop steroid - follow CBG     Code Status: DO NOT RESUSCITATE Family Communication: no family present at time of exam Disposition Plan: -Comfort care awaiting  placement to inpatient hospice   Consultants: Dr.Zeba Anwar Palliative Care    Procedure/Significant Events:    Culture NA  Antibiotics: NA  DVT prophylaxis: NA   Devices NA   LINES /  TUBES:  NA    Continuous Infusions:   Objective: VITAL SIGNS: Temp: 100.4 F (38 C) (04/07 0456) Temp Source: Axillary (04/07 0456) BP: 101/47 mmHg (04/07 0808) Pulse Rate: 76 (04/07 0808) SPO2; FIO2:   Intake/Output Summary (Last 24 hours) at 12/11/15 0810 Last data filed at 12/11/15 0455  Gross per 24 hour  Intake      0 ml  Output    900 ml  Net   -900 ml     Exam: General: A/O 0, No acute respiratory distress Eyes: Negative headache, negative scleral hemorrhage ENT: Negative Runny nose, negative gingival bleeding, Neck:  Negative scars, masses, torticollis, lymphadenopathy, JVD Lungs: Clear to auscultation bilaterally without wheezes or crackles Cardiovascular: Regular rate and rhythm without murmur gallop or rub normal S1 and S2 Abdomen:negative abdominal pain, nondistended, positive soft, bowel sounds, no rebound, no ascites, no appreciable mass Extremities: No significant cyanosis, clubbing, or edema bilateral lower extremities    Data Reviewed: Basic Metabolic Panel:  Recent Labs Lab 12/04/15 0930 12/05/15 0359 12/06/15 0536 12/07/15 0257 12/07/15 2048 12/08/15 1018  12/09/15 0110 12/09/15 0416 12/09/15 0740 12/09/15 1103 12/09/15 1554 12/10/15 0902  NA 146* 147* 149* 148* 147* 152*  < > 149* 148* 149* 147* 144  --   K 3.3* 3.9 3.4* 4.2 4.6 4.4  --   --   --   --   --   --   --   CL 110 110 106 111 110 111  --   --   --   --   --   --   --   CO2 27 28 29 26 28 31   --   --   --   --   --   --   --   GLUCOSE 147* 128* 191* 319* 260* 138*  --   --   --   --   --   --   --   BUN 35* 29* 28* 37* 38* 36*  --   --   --   --   --   --   --   CREATININE 1.66* 1.45* 1.40* 1.50* 1.54* 1.56*  --   --   --   --   --   --   --   CALCIUM 8.8* 8.9 9.5 9.6 9.3 9.3  --   --   --   --   --   --   --   MG 2.1  --   --   --   --   --   --   --   --   --   --   --  2.4  < > = values in this interval not displayed. Liver Function Tests:  Recent Labs Lab  12/04/15 0930 12/05/15 0359 12/07/15 0257 12/07/15 2048 12/08/15 1018  AST 20 24 25 20 19   ALT 15* 17 22 20 20   ALKPHOS 113 116 120 107 106  BILITOT 0.8 0.9 0.4 0.5 0.5  PROT 5.3* 5.2* 5.1* 4.8* 5.0*  ALBUMIN 2.0* 1.9* 2.1* 2.0* 2.1*   No results for input(s): LIPASE, AMYLASE in the last 168 hours.  Recent Labs Lab 12/08/15 1018  AMMONIA 9   CBC:  Recent Labs Lab 12/04/15 0930 12/05/15 0359 12/06/15 0536 12/07/15  [id number] 12/08/15 0436 12/09/15 0416 12/10/15 0902  WBC 16.7* 15.1* 12.4* 13.5* 15.4* 17.7* 26.7*  NEUTROABS 6.5 4.7  --  4.6  --   --  6.4  HGB 9.0* 9.9* 10.3* 9.4* 8.4* 8.7* 10.1*  HCT 28.6* 31.8* 33.1* 30.2* 28.5* 29.2* 32.7*  MCV 91.1 91.1 92.2 91.5 92.5 92.7 92.1  PLT 105* 94* 109* 95* 100* 109* 101*   Cardiac Enzymes:  Recent Labs Lab 12/04/15 0930 12/06/15 0906 12/06/15 1524 12/06/15 2048  TROPONINI 0.10* 0.04* 0.04* 0.04*   BNP (last 3 results)  Recent Labs  11/16/15 0353 12/02/15 2345 12/08/15 1018  BNP 406.2* 488.1* 405.9*    ProBNP (last 3 results) No results for input(s): PROBNP in the last 8760 hours.  CBG:  Recent Labs Lab 12/09/15 0408 12/09/15 0805 12/09/15 1149 12/09/15 1703 12/10/15 0047  GLUCAP 202* 159* 124* 197* 156*    Recent Results (from the past 240 hour(s))  Blood culture (routine x 2)     Status: None   Collection Time: 12/03/15 12:15 AM  Result Value Ref Range Status   Specimen Description BLOOD RIGHT ARM  Final   Special Requests BOTTLES DRAWN AEROBIC AND ANAEROBIC 5ML  Final   Culture NO GROWTH 5 DAYS  Final   Report Status 12/08/2015 FINAL  Final  Blood culture (routine x 2)     Status: None   Collection Time: 12/03/15 12:45 AM  Result Value Ref Range Status   Specimen Description BLOOD RIGHT FOREARM  Final   Special Requests BOTTLES DRAWN AEROBIC AND ANAEROBIC 5ML  Final   Culture NO GROWTH 5 DAYS  Final   Report Status 12/08/2015 FINAL  Final  Culture, Urine     Status: None   Collection  Time: 12/08/15  8:00 PM  Result Value Ref Range Status   Specimen Description URINE, CATHETERIZED  Final   Special Requests NONE  Final   Culture NO GROWTH 1 DAY  Final   Report Status 12/09/2015 FINAL  Final  MRSA PCR Screening     Status: None   Collection Time: 12/08/15  9:26 PM  Result Value Ref Range Status   MRSA by PCR NEGATIVE NEGATIVE Final    Comment:        The GeneXpert MRSA Assay (FDA approved for NASAL specimens only), is one component of a comprehensive MRSA colonization surveillance program. It is not intended to diagnose MRSA infection nor to guide or monitor treatment for MRSA infections.      Studies:  Recent x-ray studies have been reviewed in detail by the Attending Physician  Scheduled Meds:  Scheduled Meds: . antiseptic oral rinse  7 mL Mouth Rinse q12n4p  . chlorhexidine  15 mL Mouth Rinse BID  . clobetasol cream   Topical BID  . ipratropium-albuterol  3 mL Nebulization QID    Time spent on care of this patient: 40 mins   Nemiah Kissner, Geraldo Docker , MD  [facility] Office  [phone] Pager - [phone]  On-Call/Text Page:      [email or web address]      password [email or web address]  If 7PM-7AM, please contact night-coverage [email or web address] Password [email or web address] 12/11/2015, 8:10 AM   LOS: 8 days   Care during the described time interval was provided by me .  I have reviewed this patient's available data, including medical history, events of note, physical examination, and all test results as part of my evaluation. I have personally reviewed and interpreted all radiology studies.   Dia Crawford, MD [phone] Pager

## 2015-12-11 DIAGNOSIS — I5022 Chronic systolic (congestive) heart failure: Secondary | ICD-10-CM | POA: Diagnosis present

## 2015-12-11 DIAGNOSIS — N183 Chronic kidney disease, stage 3 unspecified: Secondary | ICD-10-CM | POA: Diagnosis present

## 2015-12-11 DIAGNOSIS — J9601 Acute respiratory failure with hypoxia: Secondary | ICD-10-CM | POA: Diagnosis present

## 2015-12-11 DIAGNOSIS — R21 Rash and other nonspecific skin eruption: Secondary | ICD-10-CM

## 2015-12-11 DIAGNOSIS — J441 Chronic obstructive pulmonary disease with (acute) exacerbation: Secondary | ICD-10-CM | POA: Diagnosis present

## 2015-12-11 DIAGNOSIS — R1310 Dysphagia, unspecified: Secondary | ICD-10-CM

## 2015-12-11 DIAGNOSIS — G9341 Metabolic encephalopathy: Secondary | ICD-10-CM | POA: Diagnosis present

## 2015-12-11 DIAGNOSIS — I4901 Ventricular fibrillation: Secondary | ICD-10-CM

## 2015-12-11 DIAGNOSIS — I469 Cardiac arrest, cause unspecified: Secondary | ICD-10-CM | POA: Diagnosis present

## 2015-12-11 LAB — PATHOLOGIST SMEAR REVIEW

## 2015-12-11 MED ORDER — IPRATROPIUM-ALBUTEROL 0.5-2.5 (3) MG/3ML IN SOLN: 3.0000 mL | Freq: Four times a day (QID) | RESPIRATORY_TRACT | Status: AC

## 2015-12-11 MED ORDER — MORPHINE SULFATE (PF) 2 MG/ML IV SOLN: 2.0000 mg | INTRAVENOUS | Status: AC | PRN

## 2015-12-11 MED ORDER — GLYCOPYRROLATE 0.2 MG/ML IJ SOLN: 0.4000 mg | INTRAMUSCULAR | Status: AC | PRN

## 2015-12-11 MED ORDER — RESOURCE THICKENUP CLEAR PO POWD: 1.0000 | ORAL | Status: AC | PRN

## 2015-12-11 NOTE — Discharge Summary (Signed)
Physician Discharge Summary  [name] [name] [id number] DOB: 1926-02-09 DOA: 12/02/2015  PCP: [name] Fraction, MD  Admit date: 12/02/2015 Discharge date: 12/11/2015  Time spent: 35 minutes  Recommendations for Outpatient Follow-up:  Sepsis due to RLL pneumonia, likely aspiration - Acute hypoxic respiratory failure Has now completed 8 days of abx tx - afebrile - WBC not reliable as pt on steroid - stop abx and follow clinically  -Comfort care awaiting placement to inpatient hospice COPD exacerbation No wheezing on exam today - stop steroid as this may be contributing to AMS  Diffuse bullous rash  Concerning for drug reaction (Stevens-[place] type) v/s bullous pemphigoid - doubt the later as the pt has been on systemic steroids - stop all non-essential medications - trial of clobetasol topical - if worsens will have to speak to Neuro about stopping depakote -Comfort care awaiting placement to inpatient hospice  New onset seizure Likely metabolic in nature from hypoxia - awaiting results of today's EEG   Metabolic encephalopathy Initial EEG w/ no evidence of epileptogenic focus - Neurology recommended discontinuing Aricept - repeat CT 2 w/o evidence of acute intracranial abnormality - RPR negative - 0000000 and folic acid levels therapeutic - ammonia and TSH normal - d/c potential offending meds - follow  -Comfort care awaiting placement to inpatient hospice  Dysphagia  difficulty swallowing due to AMS   Permanent atrial fibrillation / Mobitz type II AV block s/p pacemaker implant Rate presently well controlled - unable to cont Xarelto at present as he is not taking oral meds/food   Coronary artery disease - Demand ischemia No evidence to suggest ACS - very minimal trop increase most c/w demand ischemia   Essential hypertension Not an active issue at this time   Recent V. fib arrest  Chronic systolic CHF No evidence of acute exacerbation - in fact appears DH  presently -Strict in and out -Daily weight  Prolonged QTc  avoid QT prolonging medications  Chronic kidney disease stage III creatinine back to baseline -Comfort care awaiting placement to inpatient hospice  Pressure ulcer - Bilateral buttocks unstageable Frequent turns and monitoring - continue barrier cream  Severe protein calorie malnutrition No reliable means to feed at this time - PC to help establish goals of care   Hypernatremia Due to DH/free water deficit - cont free water via IV   Steroid-induced hyperglycemia Stop steroid - follow CBG     Discharge Diagnoses:  Principal Problem:   Sepsis ([place]) Active Problems:   Hypothyroidism   HYPERTENSION, BENIGN   Coronary atherosclerosis   CORONARY ATHEROSCLEROSIS, ARTERY BYPASS GRAFT   Mobitz type II atrioventricular block   Pacemaker-[employer]ude   Permanent atrial fibrillation (HCC)   Protein-calorie malnutrition, severe (HCC)   Pressure ulcer   Seizures (HCC)   HCAP (healthcare-associated pneumonia)   SOB (shortness of breath)   Encounter for palliative care   Goals of care, counseling/discussion   COPD exacerbation (HCC)   Metabolic encephalopathy   Cardiac arrest with ventricular fibrillation (HCC)   Chronic systolic CHF (congestive heart failure) (HCC)   CKD (chronic kidney disease), stage III   Acute respiratory failure with hypoxia (HCC)   Bullous rash   Dysphagia   Discharge Condition: Guarded  Diet recommendation: NPO, ice chips, sips with meds  Filed Weights   12/03/15 0400 12/08/15 2020 12/10/15 0425  Weight: 65.9 kg (145 lb 4.5 oz) 66.4 kg (146 lb 6.2 oz) 70.5 kg (155 lb 6.8 oz)    History of present illness:  82yo WM from  SNF PMHx Dementia, Recent V-Fib Arrest, Chronic Systolic CHF, A. Fib, just discharged on 3/28 (Acute Respiratory Failure requiring intubation, NSVT, A.- Fib with Heart Block S/P pacer, positive influenza A),   Who was admitted from his SNF on 12/02/2015 with a seizure  episode associated with right-sided weakness. Patient was recently hospitalized for V. fib arrest requiring intubation followed by acute respiratory failure. Patient also had persistent dysphagia at the time of the recent discharge.  Initially the patient was started on broad-spectrum antibiotics. Antibiotics were narrowed down. Blood cultures remained negative. Chest x-ray showed possible pneumonia.  Patient was started on Depakote w/ no further seizures. EEG and repeat CT scans were unremarkable, but the pt had been developing progressive lethargy and progressive dysphagia. During his hospital physician patient was treated for sepsis pneumonia secondary to aspiration resulting in acute hypoxic respiratory failure. Despite maximum treatment patients condition including his cognition continued to deteriorate. In addition to his pneumonia patient has multiple other medical problems which may his survival unlikely. After consulting with his daughter [name] [name] it was decided to discontinue aggressive care and make patient comfortable. Patient now comfort care with impending transfer to hospice.   Consultations: [name] [name]       Discharge Exam: Filed Vitals:   12/11/15 0809 12/11/15 0900 12/11/15 1000 12/11/15 1100  BP: 101/47 104/42 100/55 107/56  Pulse: 72 77 78 73  Temp: 99.8 F (37.7 C)     TempSrc: Axillary     Resp: 33 23 21 22   Height:      Weight:      SpO2: 99% 93% 95% 97%    General: A/O 1 (does not know where, why, when), positive acute respiratory distress Eyes: Negative headache, negative scleral hemorrhage ENT: Negative Runny nose, negative gingival bleeding, Neck: Negative scars, masses, torticollis, lymphadenopathy, JVD Lungs: bibasilar rhonchi, negative  wheezes or crackles Cardiovascular: Regular rate and rhythm without murmur gallop or rub normal S1 and S2 Abdomen:negative abdominal pain, nondistended, positive soft, bowel sounds, no rebound, no  ascites, no appreciable mass Extremities: No significant cyanosis, clubbing, or edema bilateral lower extremities    Discharge Instructions     Medication List    STOP taking these medications        amiodarone 200 MG tablet  Commonly known as:  PACERONE     arformoterol 15 MCG/2ML Nebu  Commonly known as:  BROVANA     atorvastatin 10 MG tablet  Commonly known as:  LIPITOR     budesonide 0.25 MG/2ML nebulizer solution  Commonly known as:  PULMICORT     donepezil 5 MG tablet  Commonly known as:  ARICEPT     feeding supplement (PRO-STAT SUGAR FREE 64) Liqd     FLONASE 50 MCG/ACT nasal spray  Generic drug:  fluticasone     furosemide 40 MG tablet  Commonly known as:  LASIX     guaiFENesin 600 MG 12 hr tablet  Commonly known as:  MUCINEX     multivitamin tablet     polyethylene glycol packet  Commonly known as:  MIRALAX / GLYCOLAX     senna-docusate 8.6-50 MG tablet  Commonly known as:  Senokot-S     tamsulosin 0.4 MG Caps capsule  Commonly known as:  FLOMAX     XARELTO 15 MG Tabs tablet  Generic drug:  Rivaroxaban      TAKE these medications        albuterol 108 (90 Base) MCG/ACT inhaler  Commonly known as:  PROVENTIL HFA;VENTOLIN  HFA  Inhale 2 puffs into the lungs every 6 (six) hours as needed. For wheezing or shortness of breath     glycopyrrolate 0.2 MG/ML injection  Commonly known as:  ROBINUL  Inject 2 mLs (0.4 mg total) into the vein every 4 (four) hours as needed (secretions).     ipratropium-albuterol 0.5-2.5 (3) MG/3ML Soln  Commonly known as:  DUONEB  Take 3 mLs by nebulization 4 (four) times daily.     morphine 2 MG/ML injection  Inject 1 mL (2 mg total) into the vein every hour as needed (or dyspnea).     RESOURCE THICKENUP CLEAR Powd  Take 120 g by mouth as needed (Follow directions on can).       Allergies  Allergen Reactions  . Amoxicillin Itching and Rash  . Penicillins Itching and Rash    Has patient had a PCN reaction  causing immediate rash, facial/tongue/throat swelling, SOB or lightheadedness with hypotension: Yes Has patient had a PCN reaction causing severe rash involving mucus membranes or skin necrosis: No Has patient had a PCN reaction that required hospitalization No Has patient had a PCN reaction occurring within the last 10 years: No If all of the above answers are "NO", then may proceed with Cephalosporin use.       The results of significant diagnostics from this hospitalization (including imaging, microbiology, ancillary and laboratory) are listed below for reference.    Significant Diagnostic Studies: Dg Chest 1 View  11/27/2015  CLINICAL DATA:  Right-sided pleural effusion status post thoracentesis EXAM: CHEST 1 VIEW COMPARISON:  11/26/2015 FINDINGS: No pneumothorax. Significant decrease in the size right pleural effusion. Trace bilateral pleural effusions remain. PICC line and cardiac pacer unchanged. Moderate cardiac enlargement stable. Stable aortic calcification. IMPRESSION: No pneumothorax status post thoracentesis. Electronically Signed   By: Skipper Cliche M.D.   On: 11/27/2015 15:06   Dg Chest 2 View  11/30/2015  CLINICAL DATA:  Patient with cough for 3 days.  Weakness. EXAM: CHEST  2 VIEW COMPARISON:  Chest radiograph 11/27/2015. FINDINGS: Right upper extremity PICC line is present with tip terminating in the superior vena cava. Left anterior chest wall pacer apparatus, leads stable in position. Stable cardiomegaly status post median sternotomy and CABG procedure. Small layering bilateral pleural effusions. Underlying pulmonary consolidation. No pneumothorax. IMPRESSION: Stable support apparatus. Cardiomegaly. Layering small bilateral pleural effusions with underlying consolidation suggestive of atelectasis. Infection not excluded. Electronically Signed   By: Lovey Newcomer M.D.   On: 11/30/2015 11:11   Dg Chest 2 View  11/26/2015  CLINICAL DATA:  Short of breath.  Week. EXAM: CHEST  2  VIEW COMPARISON:  11/20/2015 FINDINGS: Right PICC stable. Do lead left subclavian pacemaker and leads are stable. Bilateral pleural effusions have increased. Right pleural effusion is larger than the left. Associated dependent atelectasis in lower lobes is associated. Acute left sixth rib fracture is now identified. It is minimally displaced. There is also deformity of the posterior and upper left third, fourth, and fifth ribs. IMPRESSION: Increasing bilateral pleural effusions. New left-sided rib fractures. At least 1 of them, the sixth, has an acute appearance. Electronically Signed   By: Marybelle Killings M.D.   On: 11/26/2015 07:47   Ct Head Wo Contrast  12/07/2015  CLINICAL DATA:  Acute encephalopathy EXAM: CT HEAD WITHOUT CONTRAST TECHNIQUE: Contiguous axial images were obtained from the base of the skull through the vertex without intravenous contrast. COMPARISON:  12/02/2015 FINDINGS: No intracranial hemorrhage, mass effect or midline shift. Stable cerebral atrophy.  Ventricular size is stable from prior exam. Stable periventricular and patchy subcortical chronic white matter disease. Small lacunar infarct in left thalamus is stable. Stable old infarcts in right frontal lobe and left parietal lobe high convexity axial image 23. No definite acute cortical infarction. No mass lesion is noted on this unenhanced scan. No intraventricular hemorrhage. No skull fracture is noted. Paranasal sinuses and mastoid air cells are unremarkable. Atherosclerotic calcifications of carotid siphon and vertebral arteries again noted. IMPRESSION: 1. No acute intracranial abnormality. Stable atrophy and chronic white matter disease. Stable old infarcts and described above. No definite acute cortical infarction. No mass lesion is noted on this unenhanced scan. No intraventricular hemorrhage. Electronically Signed   By: Lahoma Crocker M.D.   On: 12/07/2015 16:57   Ct Head Wo Contrast  12/03/2015  CLINICAL DATA:  Code stroke. EXAM: CT  HEAD WITHOUT CONTRAST TECHNIQUE: Contiguous axial images were obtained from the base of the skull through the vertex without intravenous contrast. COMPARISON:  09/05/2012 head CT. FINDINGS: No evidence of parenchymal hemorrhage or extra-axial fluid collection. No mass lesion, mass effect, or midline shift. No CT evidence of acute transcortical infarction. New hypodense focus in the left thalamus, probably and lacunar infarct. Stable encephalomalacia in the left frontal lobe. Stable left cerebellar hemisphere infarct. Additional tiny hypodense foci in the bilateral cerebellar hemispheres appear new. Intracranial atherosclerosis. Nonspecific prominent subcortical and periventricular white matter hypodensity, most in keeping with chronic small vessel ischemic change. Diffuse cerebral volume loss. Cerebral ventricle sizes are stable and concordant with the degree of cerebral volume loss. The visualized paranasal sinuses are essentially clear. The mastoid air cells are unopacified. No evidence of calvarial fracture. IMPRESSION: 1. No acute intracranial hemorrhage. 2. New hypodense focus in the left thalamus. New tiny hypodense foci in the bilateral cerebellar hemispheres. These likely represent tiny infarcts of uncertain acuity, favor chronic. No mass-effect or midline shift. 3. Stable left frontal encephalomalacia. Diffuse cerebral volume loss and prominent chronic small vessel ischemia. These results were called by telephone at the time of interpretation on 12/03/2015 at 12:03 am to Dr. Leonel Ramsay, who verbally acknowledged these results. Electronically Signed   By: Ilona Sorrel M.D.   On: 12/03/2015 00:04   Ct Chest Wo Contrast  11/26/2015  CLINICAL DATA:  Short of breath. EXAM: CT CHEST WITHOUT CONTRAST TECHNIQUE: Multidetector CT imaging of the chest was performed following the standard protocol without IV contrast. COMPARISON:  Chest x-ray 11/26/2015, chest CT 10/24/2012 FINDINGS: Moderate right pleural effusion  with compressive atelectasis in the right lung base. Small left pleural effusion. COPD with mild emphysema. Advanced coronary calcification. Extensive aortic arch calcification. Cardiac enlargement with pacemaker. No pericardial effusion. Patchy density in the lingula similar to the prior study and likely due to scarring. Negative for pneumonia.  Negative for mass or adenopathy. Upper abdomen negative.  No acute skeletal abnormality. IMPRESSION: Bilateral pleural effusions right greater than left, possibly due to congestive heart failure. No edema is present. Cardiac enlargement with advanced coronary calcification Mild lingular scarring. COPD. Electronically Signed   By: Franchot Gallo M.D.   On: 11/26/2015 13:59   Dg Chest Port 1 View  12/08/2015  CLINICAL DATA:  Shortness of breath, fever.  Question pneumonia EXAM: PORTABLE CHEST 1 VIEW COMPARISON:  12/02/2015 FINDINGS: Left pacer remains in place, unchanged. Prior CABG. Cardiomegaly. Small bilateral pleural effusions. Mild vascular congestion. Bibasilar opacities are noted, right greater than left which could represent edema or infection. These are similar to prior study. IMPRESSION: Cardiomegaly with  vascular congestion and small effusions. Bibasilar edema or infiltrates, right slightly greater than left. No real change. Electronically Signed   By: Rolm Baptise M.D.   On: 12/08/2015 09:00   Dg Chest Portable 1 View  12/03/2015  CLINICAL DATA:  82 year old male with shortness of breath EXAM: PORTABLE CHEST 1 VIEW COMPARISON:  Radiograph dated 11/30/2015 FINDINGS: Single-view of the chest demonstrates emphysematous changes of the lungs. There is an area of increased hazy airspace density at the right lung base slightly progressed compared to prior study and concerning for developing pneumonia. Small bilateral pleural effusions noted. No pneumothorax. Stable cardiomegaly. Left pectoral pacemaker device. Median sternotomy wires and CABG vascular clips. No  acute fracture. IMPRESSION: Emphysema with small bilateral pleural effusions and possible developing pneumonia at the right base. Electronically Signed   By: Anner Crete M.D.   On: 12/03/2015 00:31   Dg Chest Port 1 View  11/20/2015  CLINICAL DATA:  Status post catheter placement. EXAM: PORTABLE CHEST 1 VIEW COMPARISON:  November 17, 2015. FINDINGS: Stable cardiomegaly. Left-sided pacemaker is unchanged in position. Interval placement of right-sided PICC line with distal tip in expected position of cavoatrial junction. No pneumothorax or pleural effusion is noted. Left lung appears clear. Mild right basilar opacity is noted which is decreased compared to prior exam consistent with edema or pneumonia. IMPRESSION: Interval placement of right-sided PICC line with distal tip in expected position of cavoatrial junction. Decreased right basilar opacity is noted consistent with improving edema or pneumonia. Electronically Signed   By: Marijo Conception, M.D.   On: 11/20/2015 12:15   Dg Chest Port 1 View  11/17/2015  CLINICAL DATA:  82 year old with influenza. Ventilator dependent respiratory failure. Followup asymmetric pulmonary edema versus pneumonia. EXAM: PORTABLE CHEST 1 VIEW COMPARISON:  11/16/2015 and earlier. FINDINGS: Endotracheal tube tip in satisfactory position projecting approximately 3 cm above the carina. Nasogastric tube courses below the diaphragm into the stomach. Cardiac silhouette markedly enlarged. Left subclavian dual lead transvenous pacemaker unchanged. Interval worsening of asymmetric interstitial and airspace opacities in the right lung with new mild interstitial opacities in the left lung. Associated small bilateral pleural effusions. IMPRESSION: 1. Support apparatus satisfactory. 2. New with interstitial edema in the left lung with progressive interstitial airspace opacities throughout the right lung, likely a combination of pulmonary edema and pneumonia. 3. Small bilateral pleural  effusions. Electronically Signed   By: Evangeline Dakin M.D.   On: 11/17/2015 07:53   Dg Chest Port 1 View  11/16/2015  CLINICAL DATA:  Pneumonia. EXAM: PORTABLE CHEST 1 VIEW COMPARISON:  11/15/2015 and 11/14/2015 FINDINGS: Endotracheal tube and NG tube are in good position pacemaker in place. Chronic cardiomegaly. The pulmonary vascularity is normal. Improving hazy density at the right lung base could represent infiltrate or small right effusion. Lungs are hyperinflated consistent with COPD. IMPRESSION: Improving density at the right lung base, either small right effusion or faint right lower lobe infiltrate. Electronically Signed   By: Lorriane Shire M.D.   On: 11/16/2015 07:32   Dg Chest Port 1 View  11/15/2015  CLINICAL DATA:  Respiratory failure EXAM: PORTABLE CHEST 1 VIEW COMPARISON:  11/14/2015 FINDINGS: Patchy opacities in the right upper lobe and right infrahilar regions, suspicious for pneumonia, possibly on the basis of aspiration. Asymmetric pulmonary edema is not excluded. This appearance is mildly improved in the upper lobe when compared to 11/13/2015, but is new from 11/10/2015. Endotracheal tube terminates 2 cm above the carina. Enteric tube courses below the diaphragm. Cardiomegaly. Postsurgical  changes related to prior CABG. Left subclavian pacemaker. Old left rib fracture deformities.  Median sternotomy. IMPRESSION: Endotracheal tube terminates 2 cm above the carina. Multifocal patchy right lung opacities, mildly improved from 11/13/2015, favored to reflect aspiration pneumonia. Asymmetric pulmonary edema is not excluded. Electronically Signed   By: Julian Hy M.D.   On: 11/15/2015 07:24   Dg Chest Port 1 View  11/14/2015  CLINICAL DATA:  Cardiac arrest.  Intubation. EXAM: PORTABLE CHEST 1 VIEW COMPARISON:  11/10/2015 FINDINGS: Endotracheal tube 5.7 cm from the carina. Enteric tube tip below the diaphragm not included in field of view. Patient is post median sternotomy. Left-sided  pacemaker in place. Cardiomegaly is unchanged. Development of diffuse perihilar and suprahilar right lung opacities, new from prior. Left lung grossly clear. No evidence of pneumothorax. Old left rib fractures. IMPRESSION: 1. Endotracheal tube 5.7 cm from the carina.  Enteric tube in place. 2. Development of right perihilar and suprahilar opacities, may reflect asymmetric pulmonary edema, pneumonia or aspiration. Cardiomegaly is stable. Electronically Signed   By: Jeb Levering M.D.   On: 11/14/2015 00:27   Dg Chest Port 1v Same Day  11/14/2015  CLINICAL DATA:  Respiratory failure EXAM: PORTABLE CHEST 1 VIEW COMPARISON:  November 13, 2015 FINDINGS: The ETT terminates at the carina. Recommend withdrawing 3 cm. No pneumothorax. The right-sided pulmonary opacity seen previously has improved but persists. This could represent asymmetric edema versus infectious process. Cardiomegaly again identified. No other acute abnormalities. IMPRESSION: 1. The ETT terminates at the carina.  Recommend withdrawing 3 cm. 2. The right-sided pulmonary opacity has improved but persist. Recommend follow-up to resolution. These results will be called to the ordering clinician or representative by the Radiologist Assistant, and communication documented in the PACS or zVision Dashboard. Electronically Signed   By: Dorise Bullion III M.D   On: 11/14/2015 11:12   Dg Abd Portable 1v  11/14/2015  CLINICAL DATA:  Orogastric tube placement. EXAM: PORTABLE ABDOMEN - 1 VIEW COMPARISON:  10/24/2012 FINDINGS: Tip and side port of the enteric tube below the diaphragm in the stomach, however looped and tip directed towards the fundus. Air-filled transverse colon. No definite small bowel dilatation. IMPRESSION: Enteric tube below the stomach, however tip directed towards the fundus. Nonobstructive bowel gas pattern. Electronically Signed   By: Jeb Levering M.D.   On: 11/14/2015 00:28   Dg Swallowing Func-speech Pathology  12/04/2015     Objective Swallowing Evaluation: Type of Study: MBS-Modified Barium Swallow Study Patient Details Name: SHANTELL DARKE MRN: [place]:[id number] Date of Birth: 1926/08/14 Today's Date: 12/04/2015 Time: SLP Start Time (ACUTE ONLY): 1140-SLP Stop Time (ACUTE ONLY): 1210 SLP Time Calculation (min) (ACUTE ONLY): 30 min Past Medical History: Past Medical History Diagnosis Date . CAD (coronary artery disease)  . Hypertension  . Vitamin D deficiency  . Chronic back pain  . Anemia  . ED (erectile dysfunction)  . Arthritis  . RLS (restless legs syndrome)  . GERD (gastroesophageal reflux disease)  . COPD (chronic obstructive pulmonary disease) ([place])  . Hyperlipidemia  . Atrial fibrillation ([place])  . Second degree Mobitz II AV block    s/p PPM . Asthma 09/15/2011 . Long term current use of anticoagulant 09/15/2011 . Thrombocytopenia ([place]) 09/15/2011 . Lymphocytosis  . CHF (congestive heart failure) ([place]) 01/01/2013 . Allergy  Past Surgical History: Past Surgical History Procedure Laterality Date . Coronary artery bypass graft   . Cataract extraction   . Pacemaker insertion   HPI: 82 y.o. male with a history of dementia  who was just recently hospitalized with a prolonged stabilization following flu cardiac arrest on 3/10.  Admitted with seizure from SNF.  Subjective: pleasant Assessment / Plan / Recommendation CHL IP CLINICAL IMPRESSIONS 12/04/2015 Therapy Diagnosis Mild pharyngeal phase dysphagia;Suspected primary esophageal dysphagia Clinical Impression Pt presents with a primary esophageal dysphagia with a mild pharyngeal component.  There is adequate clearance of all POs through the pharynx, and good laryngeal closure for all materials except thin liquids, which were aspirated.  More notable was the poor clearance of POs through the esophagus - scan revealed retention of all consistencies throughout esophageal column.  After completion of study, pt coughed and regurgitated barium.  Concern is primarily for aspiration of esophageal backflow.  Recommend continuing Dysphagia 1 diet with nectar-thick liquids.  Results and video of exam shown to pt's sister, his POA.  Sister will talk with MD re: aggressiveness of care and whether pursuing  further esophageal w/u is in pt's best interest.   Impact on safety and function Moderate aspiration risk   CHL IP TREATMENT RECOMMENDATION 12/04/2015 Treatment Recommendations Therapy as outlined in treatment plan below   Prognosis 12/03/2015 Prognosis for Safe Diet Advancement Good Barriers to Reach Goals -- Barriers/Prognosis Comment -- [facility]L IP DIET RECOMMENDATION 12/04/2015 SLP Diet Recommendations Dysphagia 1 (Puree) solids;Nectar thick liquid Liquid Administration via Cup Medication Administration Whole meds with puree Compensations Small sips/bites Postural Changes Remain semi-upright after after feeds/meals (Comment);Seated upright at 90 degrees   CHL IP OTHER RECOMMENDATIONS 12/04/2015 Recommended Consults -- Oral Care Recommendations Oral care BID Other Recommendations --   CHL IP FOLLOW UP RECOMMENDATIONS 12/03/2015 Follow up Recommendations None   CHL IP FREQUENCY AND DURATION 12/04/2015 Speech Therapy Frequency (ACUTE ONLY) min 2x/week Treatment Duration --      CHL IP ORAL PHASE 12/04/2015 Oral Phase WFL Oral - Pudding Teaspoon -- Oral - Pudding Cup -- Oral - Honey Teaspoon -- Oral - Honey Cup -- Oral - Nectar Teaspoon -- Oral - Nectar Cup -- Oral - Nectar Straw -- Oral - Thin Teaspoon -- Oral - Thin Cup -- Oral - Thin Straw -- Oral - Puree -- Oral - Mech Soft -- Oral - Regular -- Oral - Multi-Consistency -- Oral - Pill -- Oral Phase - Comment --  CHL IP PHARYNGEAL PHASE 12/04/2015 Pharyngeal Phase Impaired Pharyngeal- Pudding Teaspoon -- Pharyngeal -- Pharyngeal- Pudding Cup -- Pharyngeal -- Pharyngeal- Honey Teaspoon -- Pharyngeal -- Pharyngeal- Honey Cup -- Pharyngeal -- Pharyngeal- Nectar Teaspoon -- Pharyngeal -- Pharyngeal- Nectar Cup Delayed swallow initiation-vallecula Pharyngeal -- Pharyngeal- Nectar Straw  -- Pharyngeal -- Pharyngeal- Thin Teaspoon -- Pharyngeal -- Pharyngeal- Thin Cup Delayed swallow initiation-vallecula;Penetration/Aspiration during swallow Pharyngeal Material enters airway, passes BELOW cords and not ejected out despite cough attempt by patient Pharyngeal- Thin Straw -- Pharyngeal -- Pharyngeal- Puree Delayed swallow initiation-vallecula Pharyngeal -- Pharyngeal- Mechanical Soft -- Pharyngeal -- Pharyngeal- Regular -- Pharyngeal -- Pharyngeal- Multi-consistency -- Pharyngeal -- Pharyngeal- Pill -- Pharyngeal -- Pharyngeal Comment --  CHL IP CERVICAL ESOPHAGEAL PHASE 12/04/2015 Cervical Esophageal Phase Impaired Pudding Teaspoon -- Pudding Cup -- Honey Teaspoon -- Honey Cup -- Nectar Teaspoon -- Nectar Cup -- Nectar Straw -- Thin Teaspoon -- Thin Cup -- Thin Straw -- Puree -- Mechanical Soft -- Regular -- Multi-consistency -- Pill -- Cervical Esophageal Comment barium retention throughout esophagus No flowsheet data found. [name] 12/04/2015, 1:42 PM              US Thoracentesis Asp Pleural Space W/img Guide  11/27/2015  INDICATION:  Shortness of breath. Pleural effusion noted on recent CT scan. Request diagnostic and therapeutic thoracentesis. EXAM: ULTRASOUND GUIDED RIGHT THORACENTESIS MEDICATIONS: None. COMPLICATIONS: None immediate. PROCEDURE: An ultrasound guided thoracentesis was thoroughly discussed with the patient and questions answered. The benefits, risks, alternatives and complications were also discussed. The patient understands and wishes to proceed with the procedure. Written consent was obtained. Ultrasound was performed to localize and mark an adequate pocket of fluid in the right chest. The area was then prepped and draped in the normal sterile fashion. 1% Lidocaine was used for local anesthesia. Under ultrasound guidance a Safe-T-Centesis catheter was introduced. Thoracentesis was performed. The catheter was removed and a dressing applied. FINDINGS: A total of  approximately 1.6 L of hazy, blood-tinged fluid was removed. Samples were sent to the laboratory as requested by the clinical team. IMPRESSION: Successful ultrasound guided right thoracentesis yielding 1.6 L of pleural fluid. Read by: Ascencion Dike PA-C Electronically Signed   By: Sandi Mariscal M.D.   On: 11/27/2015 15:03    Microbiology: Recent Results (from the past 240 hour(s))  Blood culture (routine x 2)     Status: None   Collection Time: 12/03/15 12:15 AM  Result Value Ref Range Status   Specimen Description BLOOD RIGHT ARM  Final   Special Requests BOTTLES DRAWN AEROBIC AND ANAEROBIC 5ML  Final   Culture NO GROWTH 5 DAYS  Final   Report Status 12/08/2015 FINAL  Final  Blood culture (routine x 2)     Status: None   Collection Time: 12/03/15 12:45 AM  Result Value Ref Range Status   Specimen Description BLOOD RIGHT FOREARM  Final   Special Requests BOTTLES DRAWN AEROBIC AND ANAEROBIC 5ML  Final   Culture NO GROWTH 5 DAYS  Final   Report Status 12/08/2015 FINAL  Final  Culture, Urine     Status: None   Collection Time: 12/08/15  8:00 PM  Result Value Ref Range Status   Specimen Description URINE, CATHETERIZED  Final   Special Requests NONE  Final   Culture NO GROWTH 1 DAY  Final   Report Status 12/09/2015 FINAL  Final  MRSA PCR Screening     Status: None   Collection Time: 12/08/15  9:26 PM  Result Value Ref Range Status   MRSA by PCR NEGATIVE NEGATIVE Final    Comment:        The GeneXpert MRSA Assay (FDA approved for NASAL specimens only), is one component of a comprehensive MRSA colonization surveillance program. It is not intended to diagnose MRSA infection nor to guide or monitor treatment for MRSA infections.      Labs: Basic Metabolic Panel:  Recent Labs Lab 12/05/15 0359 12/06/15 0536 12/07/15 0257 12/07/15 2048 12/08/15 1018  12/09/15 0110 12/09/15 0416 12/09/15 0740 12/09/15 1103 12/09/15 1554 12/10/15 0902  NA 147* 149* 148* 147* 152*  < > 149*  148* 149* 147* 144  --   K 3.9 3.4* 4.2 4.6 4.4  --   --   --   --   --   --   --   CL 110 106 111 110 111  --   --   --   --   --   --   --   CO2 28 29 26 28 31   --   --   --   --   --   --   --   GLUCOSE 128* 191* 319* 260* 138*  --   --   --   --   --   --   --  BUN 29* 28* 37* 38* 36*  --   --   --   --   --   --   --   CREATININE 1.45* 1.40* 1.50* 1.54* 1.56*  --   --   --   --   --   --   --   CALCIUM 8.9 9.5 9.6 9.3 9.3  --   --   --   --   --   --   --   MG  --   --   --   --   --   --   --   --   --   --   --  2.4  < > = values in this interval not displayed. Liver Function Tests:  Recent Labs Lab 12/05/15 0359 12/07/15 0257 12/07/15 2048 12/08/15 1018  AST 24 25 20 19   ALT 17 22 20 20   ALKPHOS 116 120 107 106  BILITOT 0.9 0.4 0.5 0.5  PROT 5.2* 5.1* 4.8* 5.0*  ALBUMIN 1.9* 2.1* 2.0* 2.1*   No results for input(s): LIPASE, AMYLASE in the last 168 hours.  Recent Labs Lab 12/08/15 1018  AMMONIA 9   CBC:  Recent Labs Lab 12/05/15 0359 12/06/15 0536 12/07/15 0257 12/08/15 0436 12/09/15 0416 12/10/15 0902  WBC 15.1* 12.4* 13.5* 15.4* 17.7* 26.7*  NEUTROABS 4.7  --  4.6  --   --  6.4  HGB 9.9* 10.3* 9.4* 8.4* 8.7* 10.1*  HCT 31.8* 33.1* 30.2* 28.5* 29.2* 32.7*  MCV 91.1 92.2 91.5 92.5 92.7 92.1  PLT 94* 109* 95* 100* 109* 101*   Cardiac Enzymes:  Recent Labs Lab 12/06/15 0906 12/06/15 1524 12/06/15 2048  TROPONINI 0.04* 0.04* 0.04*   BNP: BNP (last 3 results)  Recent Labs  11/16/15 0353 12/02/15 2345 12/08/15 1018  BNP 406.2* 488.1* 405.9*    ProBNP (last 3 results) No results for input(s): PROBNP in the last 8760 hours.  CBG:  Recent Labs Lab 12/09/15 0408 12/09/15 0805 12/09/15 1149 12/09/15 1703 12/10/15 0047  GLUCAP 202* 159* 124* 197* 156*       Signed:  Dia Crawford, MD [facility] [phone] pager

## 2015-12-11 NOTE — Progress Notes (Signed)
Patient will discharge to [place] Anticipated discharge date: 4/7 Family notified: pt sister Transportation by [employer]- scheduled for 2pm  CSW signing off.  Domenica Reamer, [place] Social Worker [phone]

## 2016-01-04 DEATH — deceased

## 2016-02-10 NOTE — Progress Notes (Signed)
Interval History:                                                                                                                      [name] [name] is an 82 y.o. male patient with  pneumonia and sepsis, continues to have  mental status changes. EEG  Done today showed evidence of mild to moderate encephalopathy. No seizures or abnormal discharges noted on EEG.  On Depakote  500 every  12 h.  It was initially started after a possible seizure earlier during the admission. Patient had not had any further clinical seizures. He is unable to provide any history due to mental status changes. No family at bedside.     Past Medical History: Past Medical History  Diagnosis Date  . CAD (coronary artery disease)   . Hypertension   . Vitamin D deficiency   . Chronic back pain   . Anemia   . ED (erectile dysfunction)   . Arthritis   . RLS (restless legs syndrome)   . GERD (gastroesophageal reflux disease)   . COPD (chronic obstructive pulmonary disease) ([place])   . Hyperlipidemia   . Atrial fibrillation ([place])   . Second degree Mobitz II AV block     s/p PPM  . Asthma 09/15/2011  . Long term current use of anticoagulant 09/15/2011  . Thrombocytopenia ([place]) 09/15/2011  . Lymphocytosis   . CHF (congestive heart failure) ([place]) 01/01/2013  . Allergy     Past Surgical History  Procedure Laterality Date  . Coronary artery bypass graft    . Cataract extraction    . Pacemaker insertion      Family History: Family History  Problem Relation Age of Onset  . Diabetes Sister   . Cancer Mother     lung cancer  . Stroke Father   . Cancer Brother     brain cancer  . Alcohol abuse Brother     Social History:   reports that he quit smoking about 7 years ago. His smoking use included Cigarettes. He has a 240 pack-year smoking history. He has never used smokeless tobacco. He reports that he does not drink alcohol or use illicit drugs.  Allergies:  Allergies  Allergen Reactions  . Amoxicillin Itching  and Rash  . Penicillins Itching and Rash    Has patient had a PCN reaction causing immediate rash, facial/tongue/throat swelling, SOB or lightheadedness with hypotension: Yes Has patient had a PCN reaction causing severe rash involving mucus membranes or skin necrosis: No Has patient had a PCN reaction that required hospitalization No Has patient had a PCN reaction occurring within the last 10 years: No If all of the above answers are "NO", then may proceed with Cephalosporin use.      Medications:  No current facility-administered medications for this encounter.  Current outpatient prescriptions:  .  albuterol (PROVENTIL HFA;VENTOLIN HFA) 108 (90 BASE) MCG/ACT inhaler, Inhale 2 puffs into the lungs every 6 (six) hours as needed. For wheezing or shortness of breath, Disp: 18 g, Rfl: 11 .  glycopyrrolate (ROBINUL) 0.2 MG/ML injection, Inject 2 mLs (0.4 mg total) into the vein every 4 (four) hours as needed (secretions)., Disp: 1 mL, Rfl: 0 .  ipratropium-albuterol (DUONEB) 0.5-2.5 (3) MG/3ML SOLN, Take 3 mLs by nebulization 4 (four) times daily., Disp: 360 mL, Rfl: 0 .  Maltodextrin-Xanthan Gum (RESOURCE THICKENUP CLEAR) POWD, Take 120 g by mouth as needed (Follow directions on can)., Disp: 1 Can, Rfl: 0 .  morphine 2 MG/ML injection, Inject 1 mL (2 mg total) into the vein every hour as needed (or dyspnea)., Disp: 1 mL, Rfl: 0   Neurologic Examination:                                                                                                     Today's Vitals   12/11/15 1100 12/11/15 1200 12/11/15 1216 12/11/15 1300  BP: 107/56 106/92 106/92 101/49  Pulse: 73 79 80 76  Temp:      TempSrc:      Resp: 22 23 20 15   Height:      Weight:      SpO2: 97% 86%  98%  PainSc:       Drowsy, arousable to verbal commands. No gaze deviation or nystagmus noted no abnormal  involuntary movements noted. He moves all 4 extremities to command.    Lab Results: Basic Metabolic Panel: No results for input(s): NA, K, CL, CO2, GLUCOSE, BUN, CREATININE, CALCIUM, MG, PHOS in the last 168 hours.  Liver Function Tests: No results for input(s): AST, ALT, ALKPHOS, BILITOT, PROT, ALBUMIN in the last 168 hours. No results for input(s): LIPASE, AMYLASE in the last 168 hours. No results for input(s): AMMONIA in the last 168 hours.  CBC: No results for input(s): WBC, NEUTROABS, HGB, HCT, MCV, PLT in the last 168 hours.  Cardiac Enzymes: No results for input(s): CKTOTAL, CKMB, CKMBINDEX, TROPONINI in the last 168 hours.  Lipid Panel: No results for input(s): CHOL, TRIG, HDL, CHOLHDL, VLDL, LDLCALC in the last 168 hours.  CBG: No results for input(s): GLUCAP in the last 168 hours.  Microbiology: Results for orders placed or performed during the hospital encounter of 12/02/15  Blood culture (routine x 2)     Status: None   Collection Time: 12/03/15 12:15 AM  Result Value Ref Range Status   Specimen Description BLOOD RIGHT ARM  Final   Special Requests BOTTLES DRAWN AEROBIC AND ANAEROBIC 5ML  Final   Culture NO GROWTH 5 DAYS  Final   Report Status 12/08/2015 FINAL  Final  Blood culture (routine x 2)     Status: None   Collection Time: 12/03/15 12:45 AM  Result Value Ref Range Status   Specimen Description BLOOD RIGHT FOREARM  Final   Special Requests BOTTLES DRAWN AEROBIC AND ANAEROBIC 5ML  Final   Culture NO GROWTH  5 DAYS  Final   Report Status 12/08/2015 FINAL  Final  Culture, Urine     Status: None   Collection Time: 12/08/15  8:00 PM  Result Value Ref Range Status   Specimen Description URINE, CATHETERIZED  Final   Special Requests NONE  Final   Culture NO GROWTH 1 DAY  Final   Report Status 12/09/2015 FINAL  Final  MRSA PCR Screening     Status: None   Collection Time: 12/08/15  9:26 PM  Result Value Ref Range Status   MRSA by PCR NEGATIVE NEGATIVE Final     Comment:        The GeneXpert MRSA Assay (FDA approved for NASAL specimens only), is one component of a comprehensive MRSA colonization surveillance program. It is not intended to diagnose MRSA infection nor to guide or monitor treatment for MRSA infections.     Imaging: No results found.  Assessment and plan:   [name] [name] is an 82 y.o. male patient with toxic metabolic encephalopathy based on clinical and electrographic evaluation. EEG today showed evidence of encephalopathy, without abnormal discharges or seizures noted. On Depakote for seizures as described above. It was initially started due to a seizure earlier during this admission.   Improvement of his mental status will depend on resolution of encephalopathy secondary to infections. Otherwise, no new recommendations from neurology at this time.  We'll follow-up intermittently.

## 2017-03-17 IMAGING — CT CT HEAD W/O CM
3 of 5 series · 14 of 47 positions shown, 16 images · non-contrast
Comparison: Prior study from 07/09/2014.

CLINICAL DATA: Initial evaluation for acute trauma, fall.

EXAM:
CT HEAD WITHOUT CONTRAST
CT CERVICAL SPINE WITHOUT CONTRAST
TECHNIQUE: Multidetector CT imaging of the head and cervical spine was
performed following the standard protocol without intravenous
contrast. Multiplanar CT image reconstructions of the cervical spine
were also generated.

[Series 5: soft tissue · axial · 0.31mm/px · z∈[-257,-97]mm · 8 of 98 slices shown, 10 images]
[im 9/98  brain]
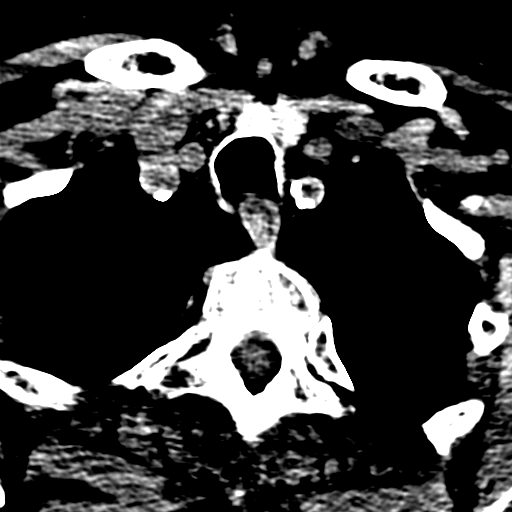
[im 9/98  bone]
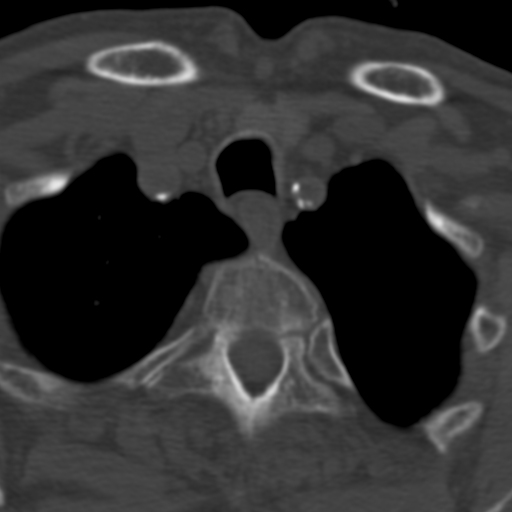
[im 25/98  brain]
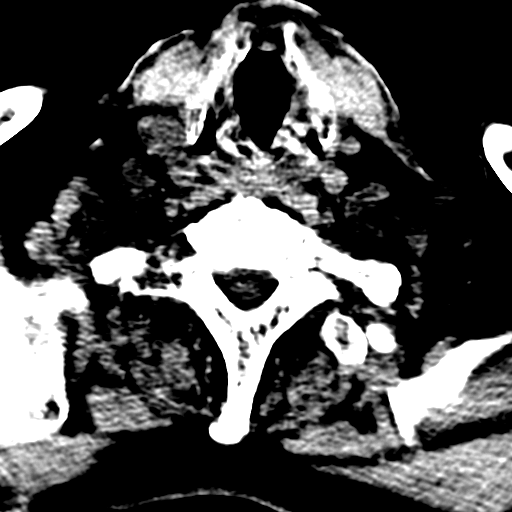
[im 33/98  brain]
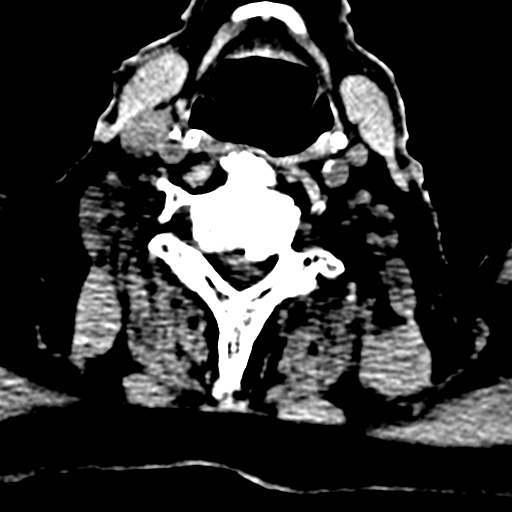
[im 41/98  brain]
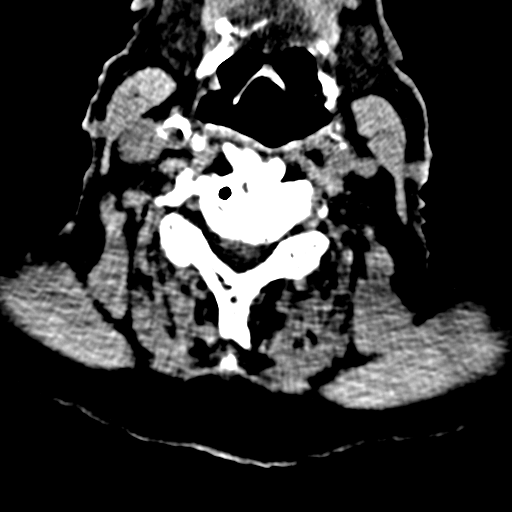
[im 57/98  brain]
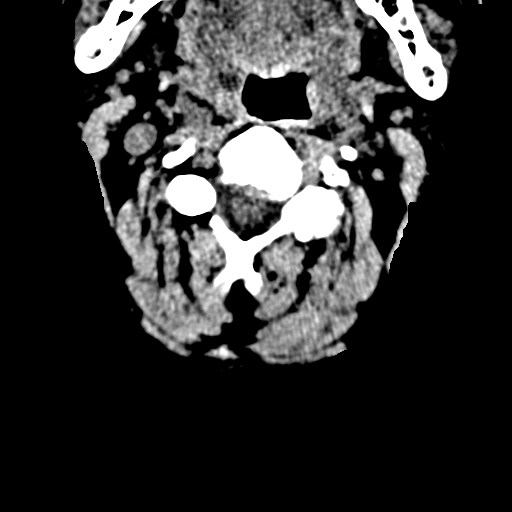
[im 57/98  bone]
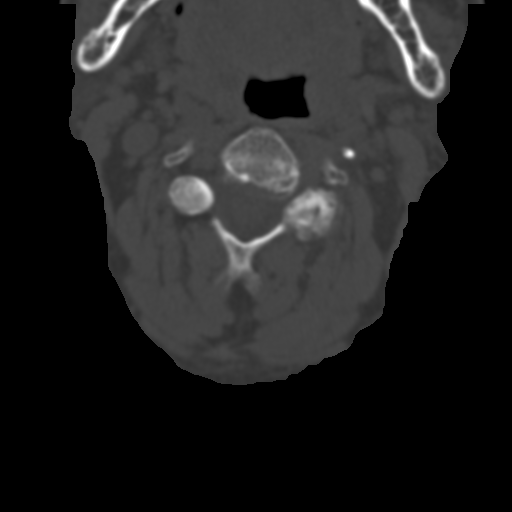
[im 65/98  brain]
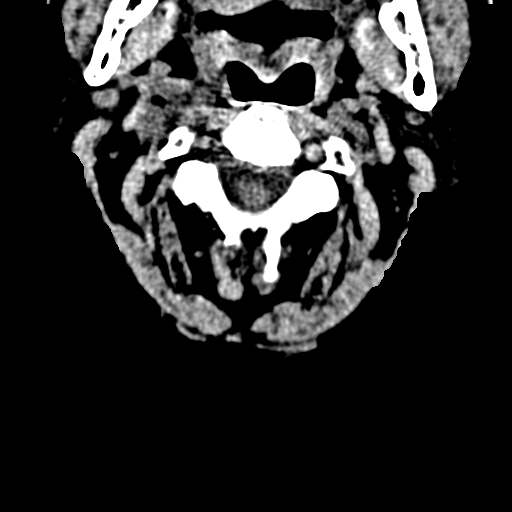
[im 73/98  brain]
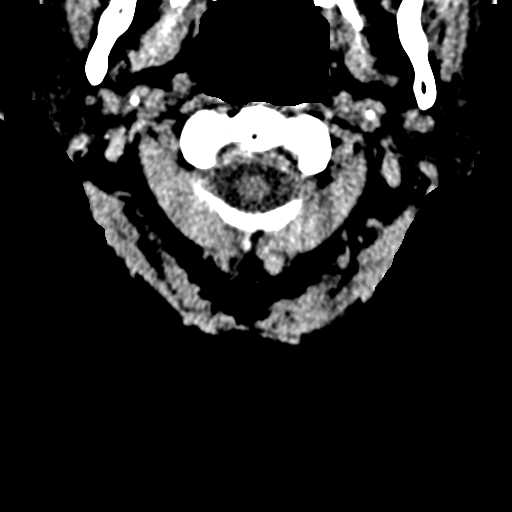
[im 89/98  brain]
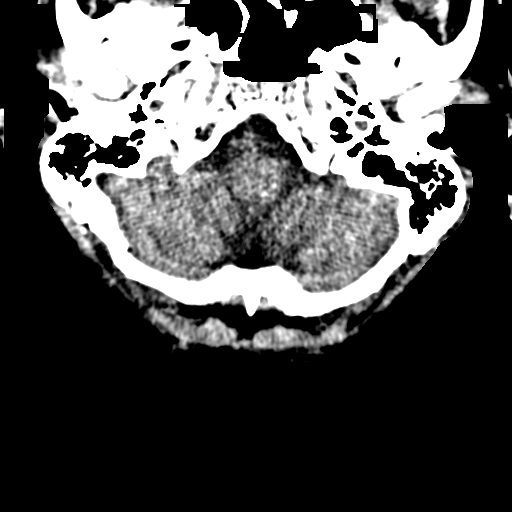

[Series 6: sagittal bone · sagittal · 0.29mm/px · 3 of 47 slices shown]
[im 16/47  brain]
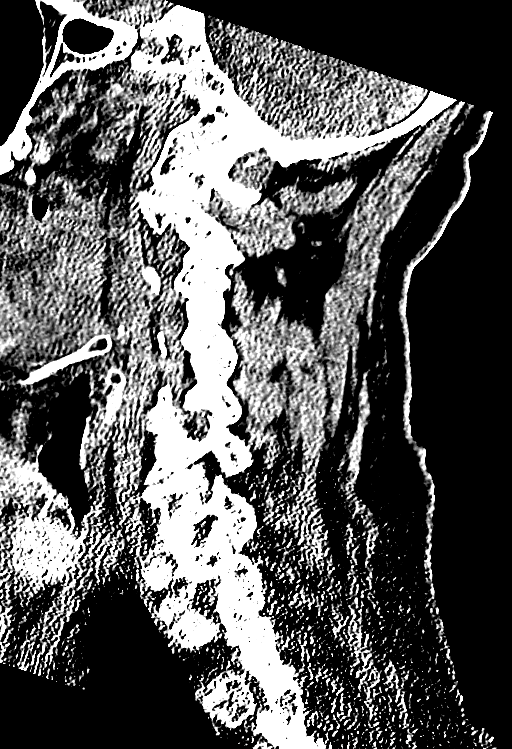
[im 24/47  brain]
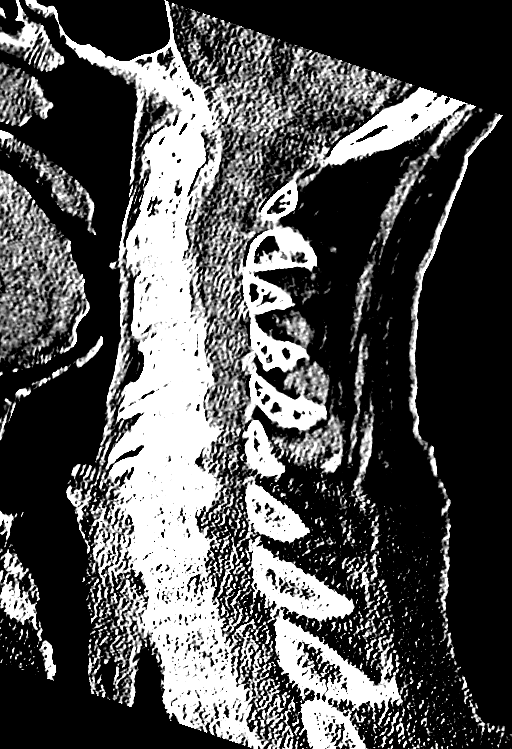
[im 31/47  brain]
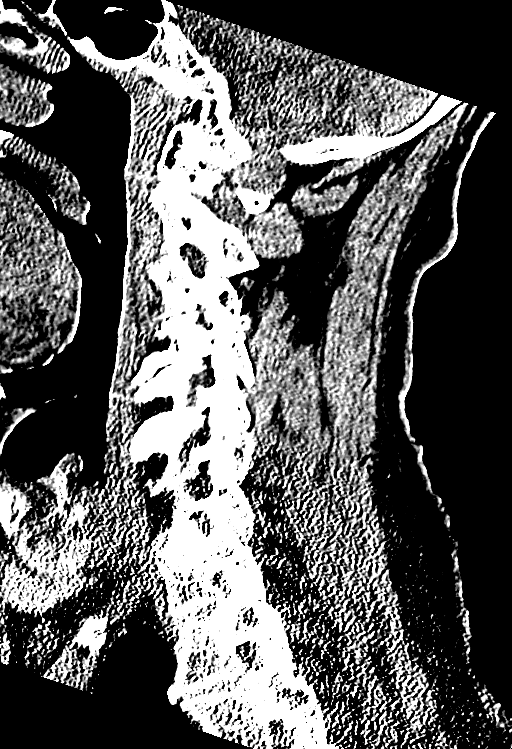

[Series 7: coronal bone · coronal · 0.29mm/px · 3 of 52 slices shown]
[im 18/52  brain]
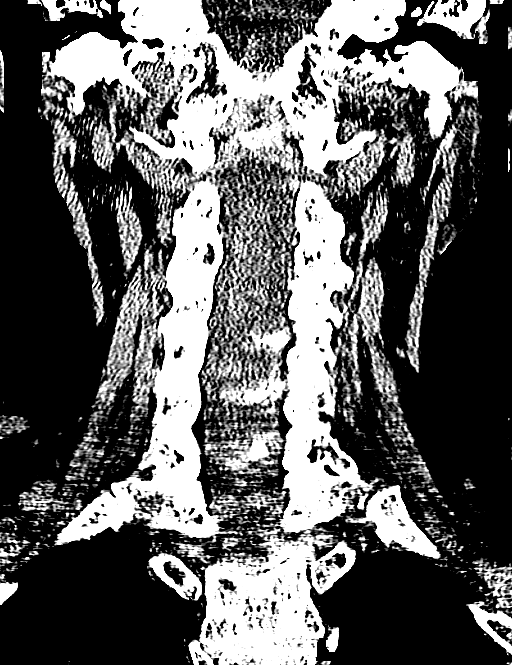
[im 23/52  brain]
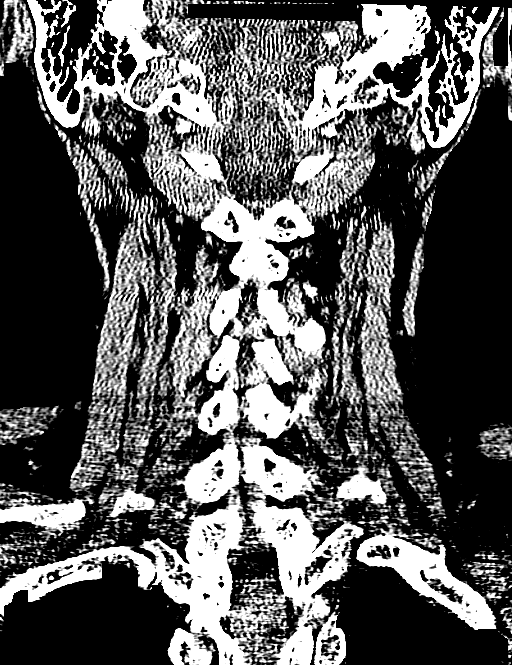
[im 29/52  brain]
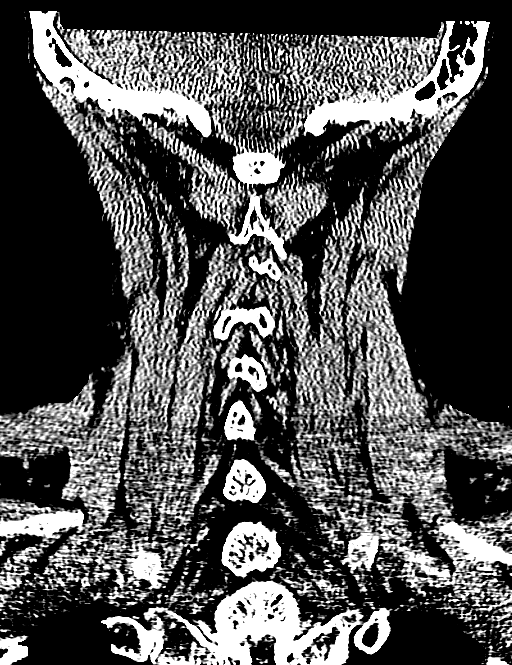

[14 of 47 positions shown; findings below may reference images not displayed]

FINDINGS: CT HEAD FINDINGS

Left frontal scalp contusion present. Scalp soft tissues otherwise
unremarkable. No acute abnormality about the orbits.

No acute calvarial fracture. Paranasal sinuses and mastoid air cells
are clear.

Vascular calcifications within the carotid siphons. Advanced
cerebral atrophy with chronic microvascular ischemic disease.

No acute large vessel territory infarct. No intracranial hemorrhage.
No mass lesion or midline shift. No mass effect. Ventricular
prominence related global parenchymal volume loss present without
hydrocephalus.

CT CERVICAL SPINE FINDINGS

Trace anterolisthesis of C3 on C4 and T2 on T3. Otherwise, the
vertebral bodies are normally aligned with preservation of the
normal cervical lordosis. Vertebral body heights are preserved.
Normal C1-2 articulations are intact. No prevertebral soft tissue
swelling. No acute fracture or listhesis.

Advanced degenerative spondylolysis as evidenced by intervertebral
disc space narrowing, endplate sclerosis, and osteophytosis present
at C4-5 and C5-6, and to a lesser extent C6-7 and C7-T1.

Visualized soft tissues of the neck demonstrate no acute
abnormality. Prominent vascular calcifications about the carotid
bifurcations. Visualized lung apices are clear without evidence of
apical pneumothorax.
IMPRESSION: CT BRAIN:

1. No acute intracranial process.
2. Left frontal scalp contusion.
3. Advanced cerebral atrophy with chronic small vessel ischemic
disease
CT CERVICAL SPINE:

No acute traumatic injury within the cervical spine.

## 2017-08-24 IMAGING — CR DG CHEST 1V PORT
1 series · 1 of 1 positions shown · non-contrast
Comparison: 11/10/2015

CLINICAL DATA: Shortness breath, cough for 2-3 days

EXAM:
PORTABLE CHEST 1 VIEW

[AP]
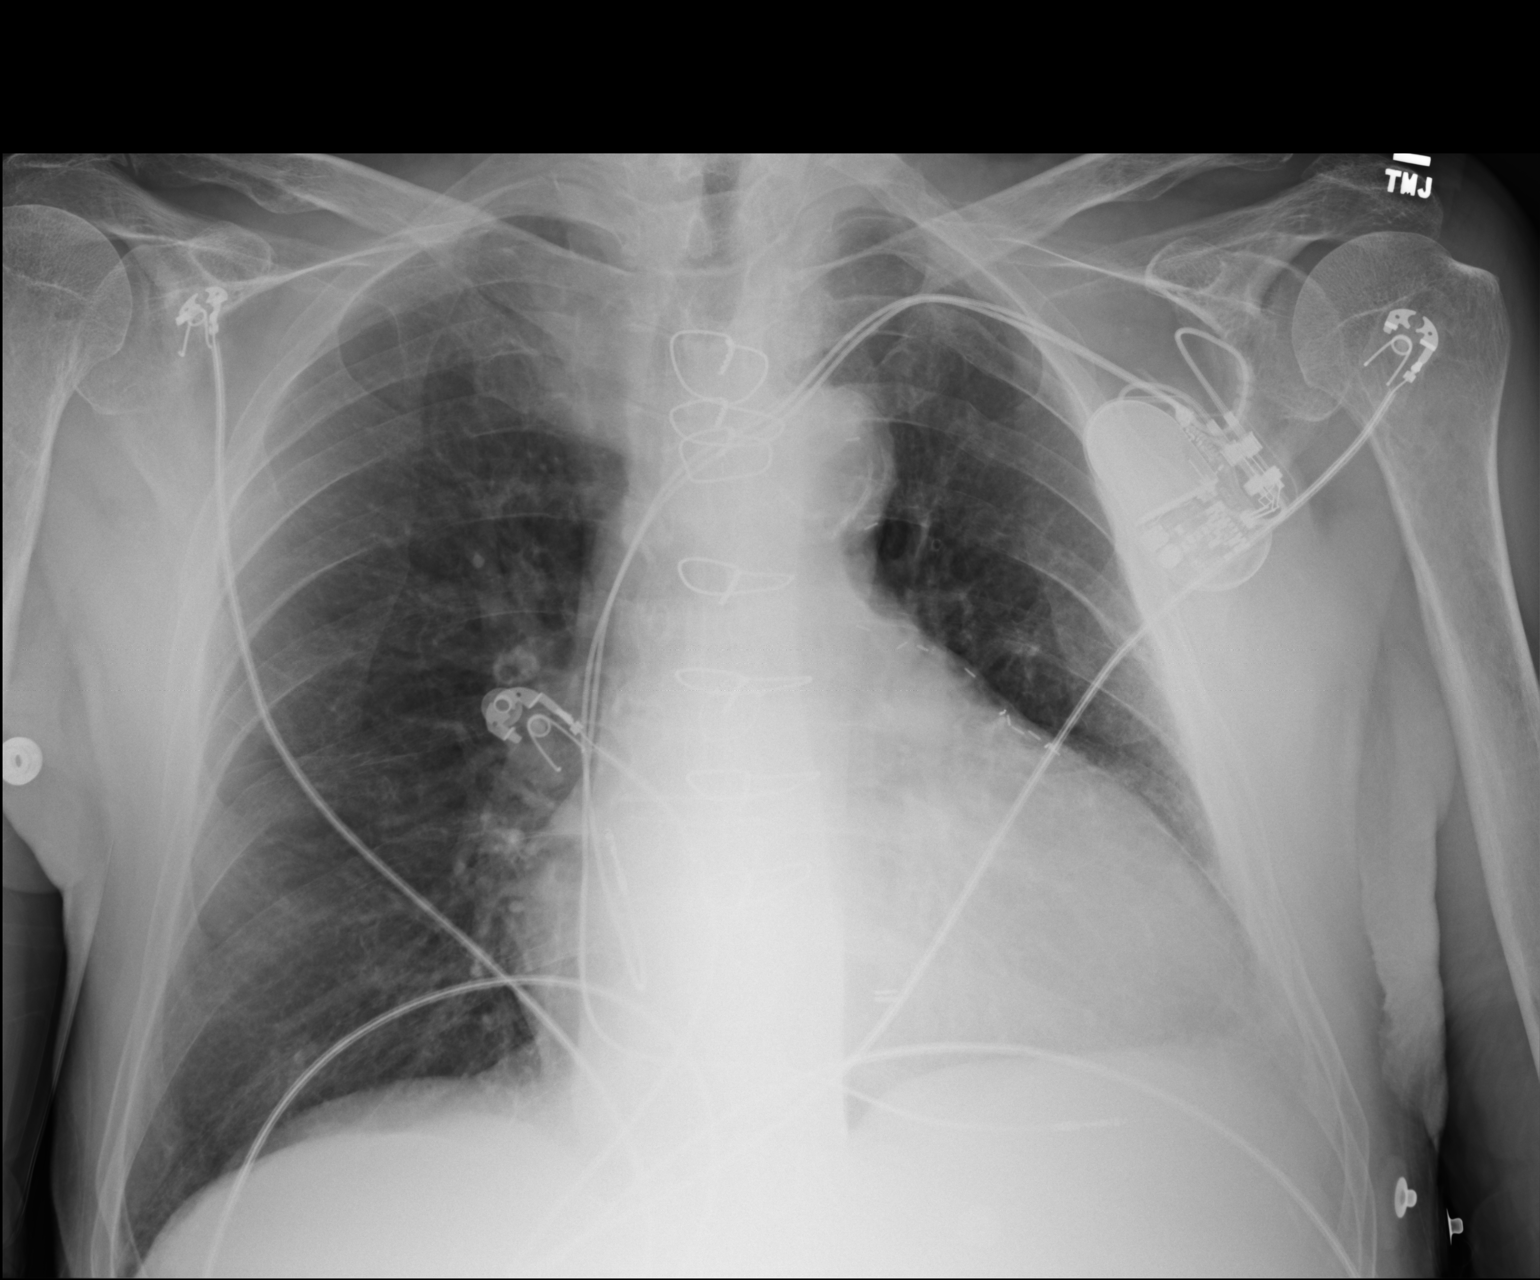

[1 of 1 positions shown; findings below may reference images not displayed]

FINDINGS: There is mild left basilar scarring. There is no focal
consolidation. There is no pleural effusion or pneumothorax. There
is stable cardiomegaly. There is prior CABG. There is a cardiac
pacemaker present.

The osseous structures are unremarkable.
IMPRESSION: No active disease.

## 2017-08-28 IMAGING — CR DG CHEST 1V PORT SAME DAY
1 series · 1 of 1 positions shown · non-contrast
Comparison: November 13, 2015

CLINICAL DATA: Respiratory failure

EXAM:
PORTABLE CHEST 1 VIEW

[AP]
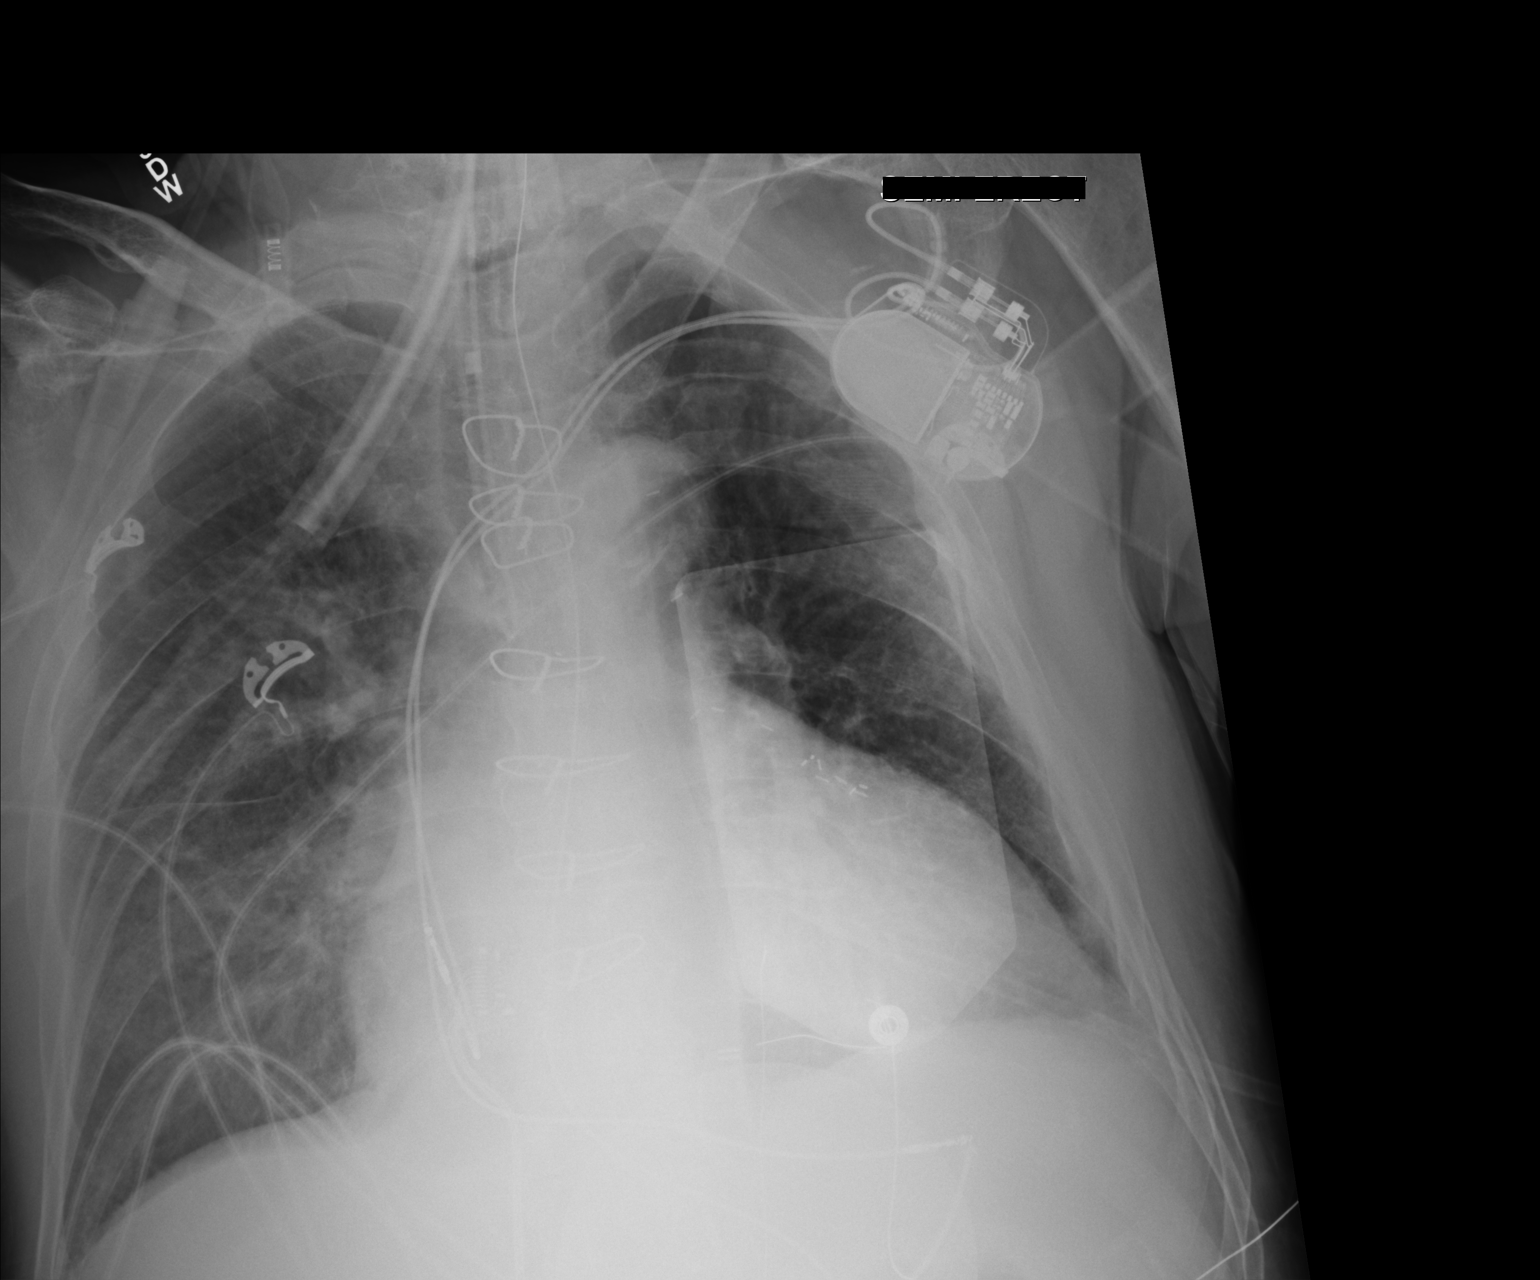

[1 of 1 positions shown; findings below may reference images not displayed]

FINDINGS: The ETT terminates at the carina. Recommend withdrawing 3 cm. No
pneumothorax. The right-sided pulmonary opacity seen previously has
improved but persists. This could represent asymmetric edema versus
infectious process. Cardiomegaly again identified. No other acute
abnormalities.
IMPRESSION: 1. The ETT terminates at the carina.  Recommend withdrawing 3 cm.
2. The right-sided pulmonary opacity has improved but persist.
Recommend follow-up to resolution.
These results will be called to the ordering clinician or
representative by the Radiologist Assistant, and communication
documented in the PACS or zVision Dashboard.

## 2017-08-29 IMAGING — CR DG CHEST 1V PORT
1 series · 1 of 1 positions shown · non-contrast
Comparison: 11/14/2015

CLINICAL DATA: Respiratory failure

EXAM:
PORTABLE CHEST 1 VIEW

[AP]
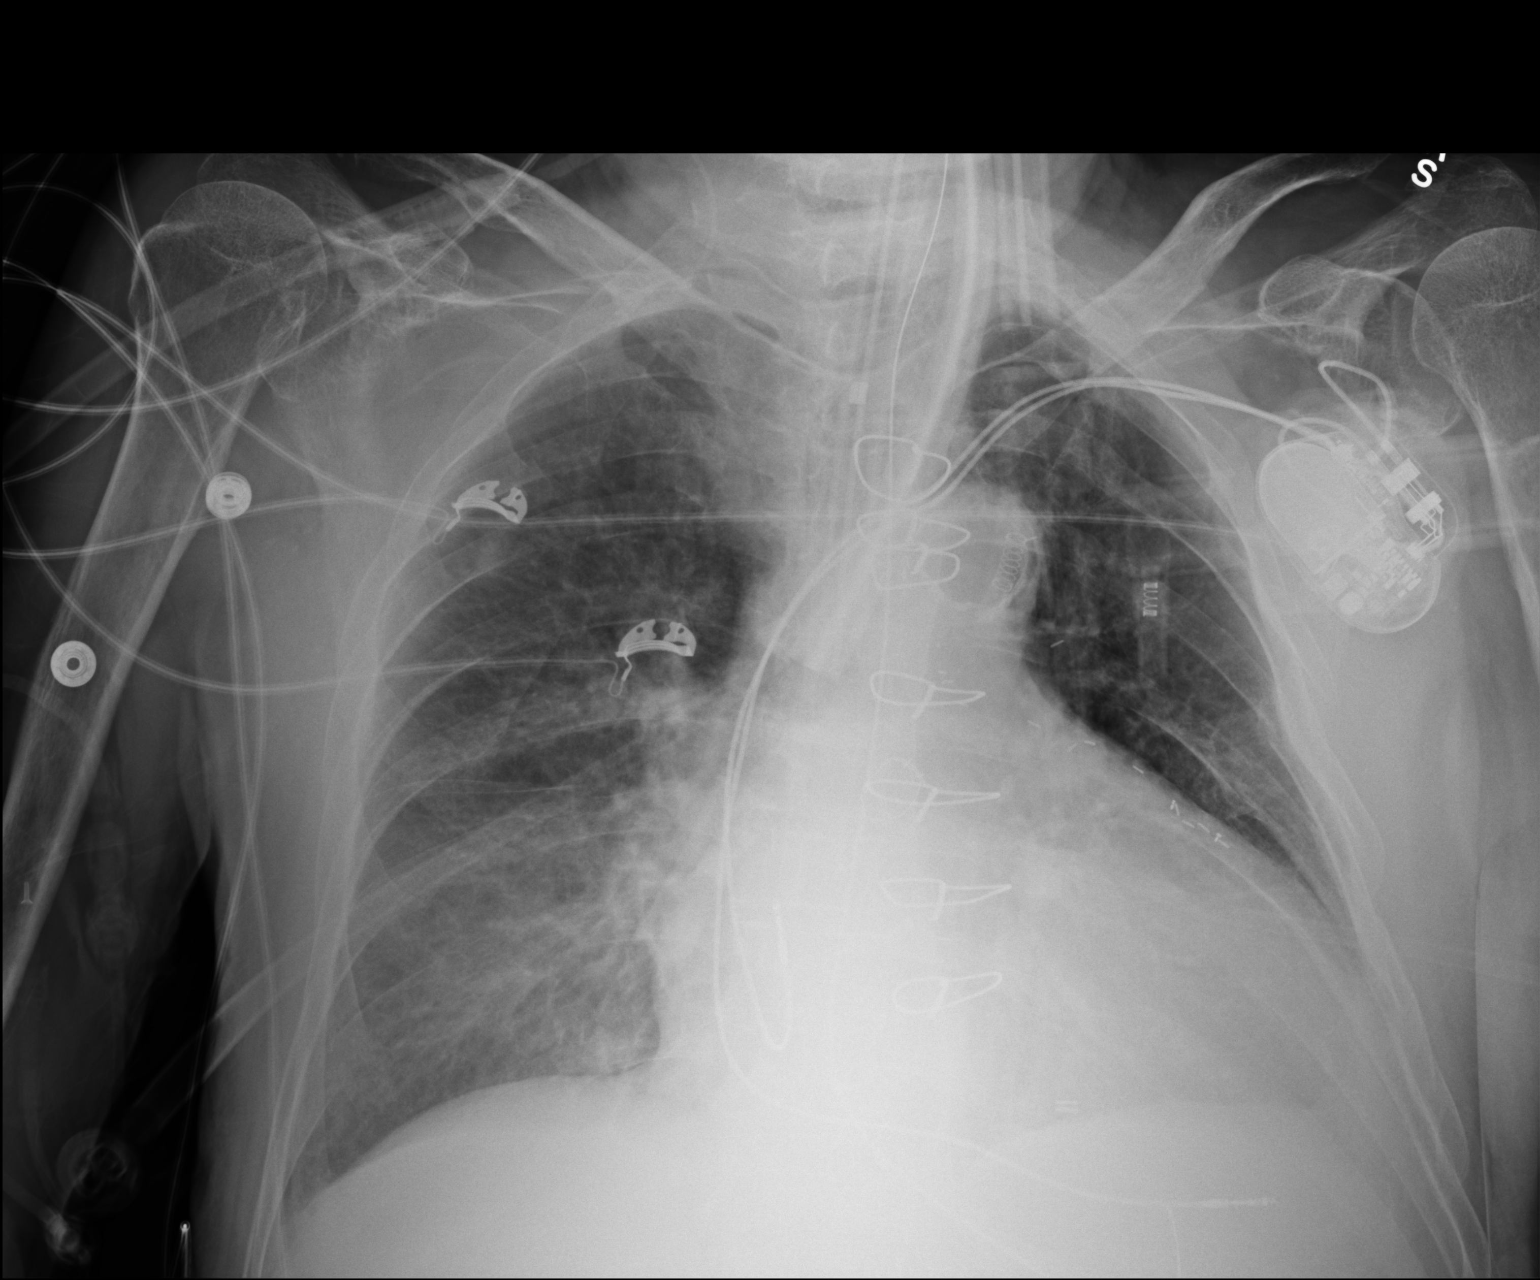

[1 of 1 positions shown; findings below may reference images not displayed]

FINDINGS: Patchy opacities in the right upper lobe and right infrahilar
regions, suspicious for pneumonia, possibly on the basis of
aspiration. Asymmetric pulmonary edema is not excluded. This
appearance is mildly improved in the upper lobe when compared to
11/13/2015, but is new from 11/10/2015.

Endotracheal tube terminates 2 cm above the carina. Enteric tube
courses below the diaphragm.

Cardiomegaly. Postsurgical changes related to prior CABG. Left
subclavian pacemaker.

Old left rib fracture deformities.  Median sternotomy.
IMPRESSION: Endotracheal tube terminates 2 cm above the carina.

Multifocal patchy right lung opacities, mildly improved from
11/13/2015, favored to reflect aspiration pneumonia. Asymmetric
pulmonary edema is not excluded.

## 2017-08-30 IMAGING — CR DG CHEST 1V PORT
1 series · 1 of 1 positions shown · non-contrast
Comparison: 11/15/2015 and 11/14/2015

CLINICAL DATA: Pneumonia.

EXAM:
PORTABLE CHEST 1 VIEW

[AP]
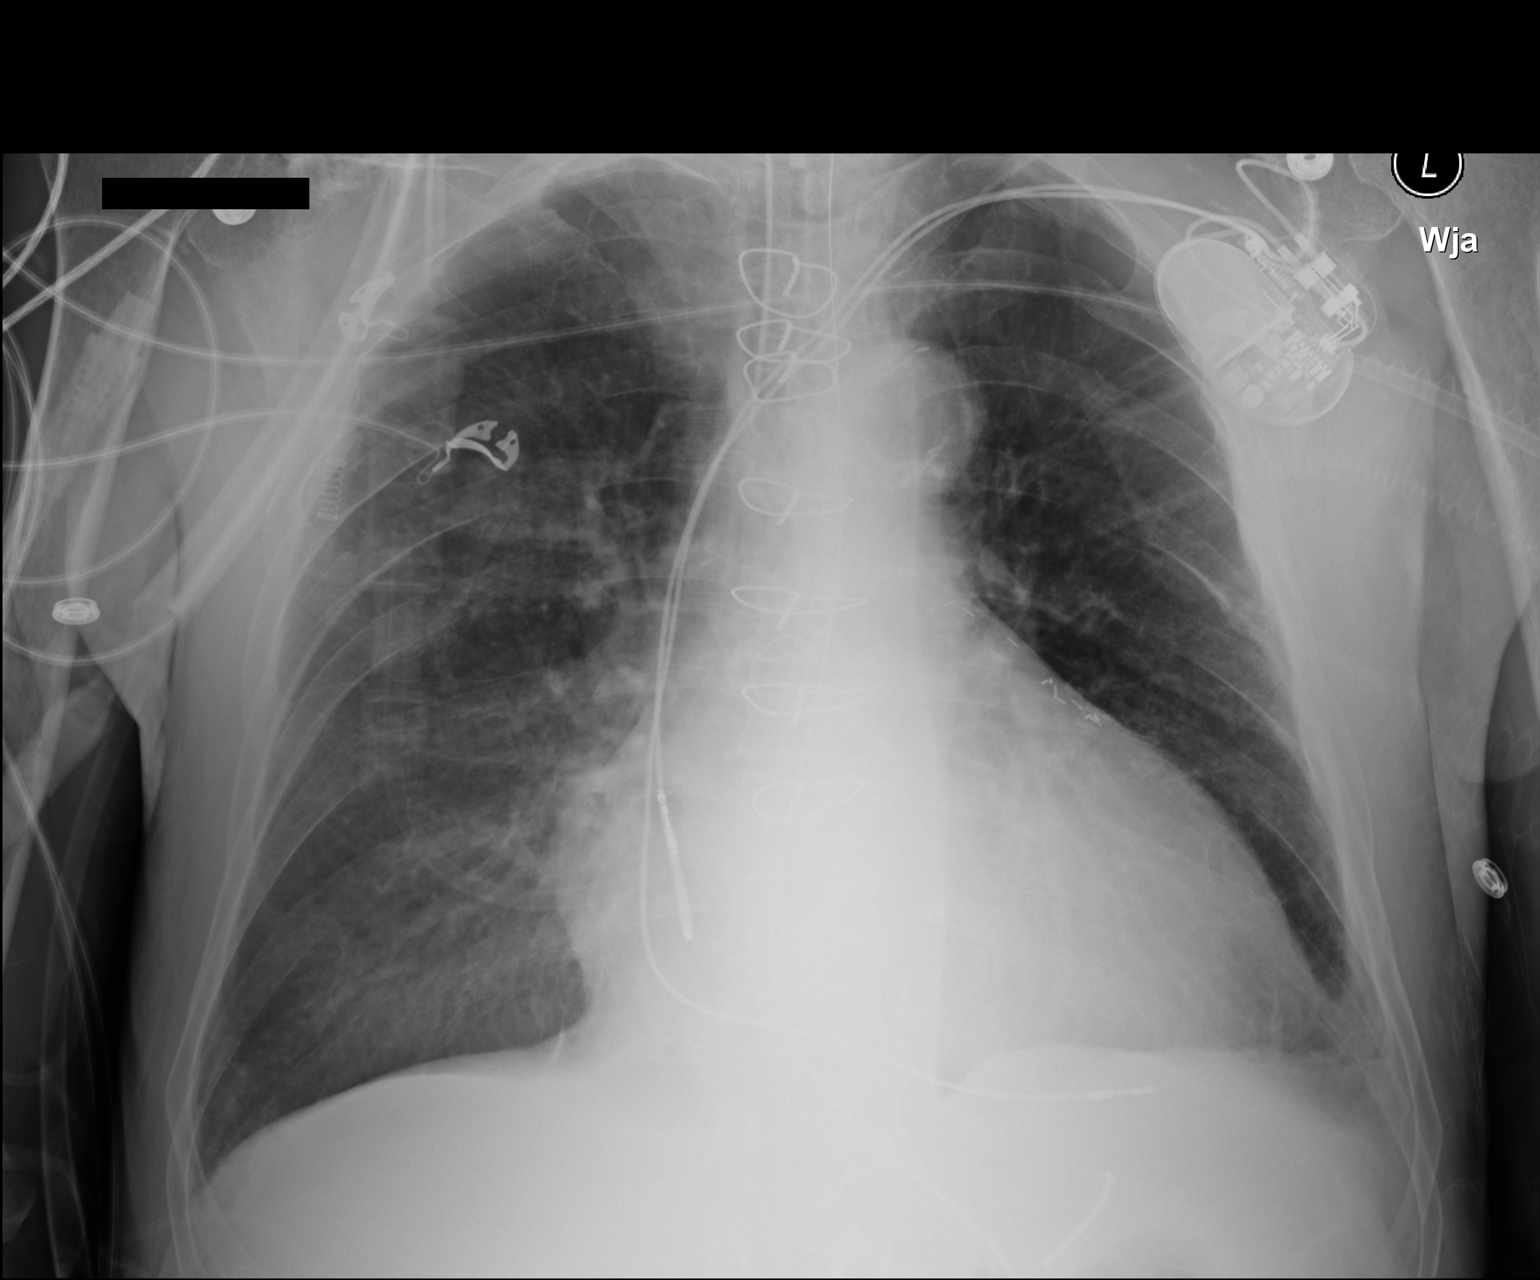

[1 of 1 positions shown; findings below may reference images not displayed]

FINDINGS: Endotracheal tube and NG tube are in good position pacemaker in
place. Chronic cardiomegaly.

The pulmonary vascularity is normal. Improving hazy density at the
right lung base could represent infiltrate or small right effusion.
Lungs are hyperinflated consistent with COPD.
IMPRESSION: Improving density at the right lung base, either small right
effusion or faint right lower lobe infiltrate.
